# Patient Record
Sex: Female | Born: 1955 | Race: White | Hispanic: No | Marital: Married | Smoking: Never smoker
Health system: Southern US, Community
[De-identification: ages and names within clinical notes are randomized; demographics above are authoritative.]

## PROBLEM LIST (undated history)

## (undated) DIAGNOSIS — L309 Dermatitis, unspecified: Secondary | ICD-10-CM

## (undated) DIAGNOSIS — J449 Chronic obstructive pulmonary disease, unspecified: Secondary | ICD-10-CM

## (undated) DIAGNOSIS — J069 Acute upper respiratory infection, unspecified: Secondary | ICD-10-CM

## (undated) DIAGNOSIS — E119 Type 2 diabetes mellitus without complications: Secondary | ICD-10-CM

## (undated) DIAGNOSIS — M069 Rheumatoid arthritis, unspecified: Secondary | ICD-10-CM

## (undated) DIAGNOSIS — J45909 Unspecified asthma, uncomplicated: Secondary | ICD-10-CM

## (undated) DIAGNOSIS — J189 Pneumonia, unspecified organism: Secondary | ICD-10-CM

## (undated) DIAGNOSIS — R011 Cardiac murmur, unspecified: Secondary | ICD-10-CM

## (undated) DIAGNOSIS — K219 Gastro-esophageal reflux disease without esophagitis: Secondary | ICD-10-CM

## (undated) DIAGNOSIS — I10 Essential (primary) hypertension: Secondary | ICD-10-CM

## (undated) DIAGNOSIS — T783XXA Angioneurotic edema, initial encounter: Secondary | ICD-10-CM

## (undated) DIAGNOSIS — N289 Disorder of kidney and ureter, unspecified: Secondary | ICD-10-CM

## (undated) DIAGNOSIS — Z87442 Personal history of urinary calculi: Secondary | ICD-10-CM

## (undated) DIAGNOSIS — D801 Nonfamilial hypogammaglobulinemia: Secondary | ICD-10-CM

## (undated) DIAGNOSIS — M1990 Unspecified osteoarthritis, unspecified site: Secondary | ICD-10-CM

## (undated) DIAGNOSIS — L509 Urticaria, unspecified: Secondary | ICD-10-CM

## (undated) HISTORY — PX: SHOULDER SURGERY: SHX246

## (undated) HISTORY — PX: ADENOIDECTOMY: SUR15

## (undated) HISTORY — PX: KIDNEY STONE SURGERY: SHX686

## (undated) HISTORY — DX: Acute upper respiratory infection, unspecified: J06.9

## (undated) HISTORY — DX: Type 2 diabetes mellitus without complications: E11.9

## (undated) HISTORY — DX: Angioneurotic edema, initial encounter: T78.3XXA

## (undated) HISTORY — PX: TONSILLECTOMY: SUR1361

## (undated) HISTORY — DX: Dermatitis, unspecified: L30.9

## (undated) HISTORY — PX: TYMPANOSTOMY TUBE PLACEMENT: SHX32

## (undated) HISTORY — DX: Urticaria, unspecified: L50.9

## (undated) HISTORY — PX: SINOSCOPY: SHX187

## (undated) HISTORY — DX: Essential (primary) hypertension: I10

## (undated) HISTORY — PX: ENDOMETRIAL ABLATION: SHX621

## (undated) SURGERY — BRONCHOSCOPY, FLEXIBLE
Anesthesia: Monitor Anesthesia Care | Laterality: Bilateral

---

## 1989-05-13 HISTORY — PX: TUBAL LIGATION: SHX77

## 2002-12-09 ENCOUNTER — Encounter: Payer: Self-pay | Admitting: Internal Medicine

## 2002-12-09 ENCOUNTER — Inpatient Hospital Stay (HOSPITAL_COMMUNITY): Admission: EM | Admit: 2002-12-09 | Discharge: 2002-12-12 | Payer: Self-pay | Admitting: Internal Medicine

## 2002-12-10 ENCOUNTER — Encounter: Payer: Self-pay | Admitting: Internal Medicine

## 2003-06-09 ENCOUNTER — Ambulatory Visit (HOSPITAL_COMMUNITY): Admission: RE | Admit: 2003-06-09 | Discharge: 2003-06-09 | Payer: Self-pay | Admitting: Obstetrics & Gynecology

## 2005-05-14 ENCOUNTER — Ambulatory Visit (HOSPITAL_COMMUNITY): Admission: RE | Admit: 2005-05-14 | Discharge: 2005-05-14 | Payer: Self-pay | Admitting: Internal Medicine

## 2005-05-16 ENCOUNTER — Ambulatory Visit (HOSPITAL_COMMUNITY): Admission: RE | Admit: 2005-05-16 | Discharge: 2005-05-16 | Payer: Self-pay | Admitting: Internal Medicine

## 2005-05-17 ENCOUNTER — Encounter: Admission: RE | Admit: 2005-05-17 | Discharge: 2005-05-17 | Payer: Self-pay | Admitting: Internal Medicine

## 2006-08-28 ENCOUNTER — Ambulatory Visit (HOSPITAL_COMMUNITY): Admission: RE | Admit: 2006-08-28 | Discharge: 2006-08-28 | Payer: Self-pay | Admitting: Internal Medicine

## 2007-05-26 ENCOUNTER — Emergency Department (HOSPITAL_COMMUNITY): Admission: EM | Admit: 2007-05-26 | Discharge: 2007-05-27 | Payer: Self-pay | Admitting: Emergency Medicine

## 2007-05-29 ENCOUNTER — Ambulatory Visit (HOSPITAL_COMMUNITY): Admission: RE | Admit: 2007-05-29 | Discharge: 2007-05-29 | Payer: Self-pay | Admitting: Urology

## 2007-12-01 ENCOUNTER — Ambulatory Visit (HOSPITAL_COMMUNITY): Admission: RE | Admit: 2007-12-01 | Discharge: 2007-12-01 | Payer: Self-pay | Admitting: Internal Medicine

## 2008-11-04 ENCOUNTER — Encounter: Payer: Self-pay | Admitting: Emergency Medicine

## 2008-11-04 ENCOUNTER — Observation Stay (HOSPITAL_COMMUNITY): Admission: EM | Admit: 2008-11-04 | Discharge: 2008-11-05 | Payer: Self-pay | Admitting: Emergency Medicine

## 2010-06-03 ENCOUNTER — Encounter: Payer: Self-pay | Admitting: Internal Medicine

## 2010-08-02 ENCOUNTER — Other Ambulatory Visit (HOSPITAL_COMMUNITY): Payer: Self-pay | Admitting: Internal Medicine

## 2010-08-02 DIAGNOSIS — Z139 Encounter for screening, unspecified: Secondary | ICD-10-CM

## 2010-08-07 ENCOUNTER — Ambulatory Visit (HOSPITAL_COMMUNITY)
Admission: RE | Admit: 2010-08-07 | Discharge: 2010-08-07 | Disposition: A | Discharge status: Home / Self Care | Payer: No Typology Code available for payment source | Source: Ambulatory Visit | Attending: Internal Medicine | Admitting: Internal Medicine

## 2010-08-07 DIAGNOSIS — Z139 Encounter for screening, unspecified: Secondary | ICD-10-CM

## 2010-08-07 DIAGNOSIS — Z1231 Encounter for screening mammogram for malignant neoplasm of breast: Secondary | ICD-10-CM | POA: Insufficient documentation

## 2010-08-20 LAB — BASIC METABOLIC PANEL WITH GFR
BUN: 12 mg/dL (ref 6–23)
CO2: 24 meq/L (ref 19–32)
Calcium: 8.7 mg/dL (ref 8.4–10.5)
Chloride: 110 meq/L (ref 96–112)
Creatinine, Ser: 0.62 mg/dL (ref 0.4–1.2)
GFR calc non Af Amer: >60 mL/min
Glucose, Bld: 96 mg/dL (ref 70–99)
Potassium: 4 meq/L (ref 3.5–5.1)
Sodium: 141 meq/L (ref 135–145)

## 2010-08-20 LAB — URINE MICROSCOPIC-ADD ON

## 2010-08-20 LAB — CBC
HCT: 36.7 % (ref 36.0–46.0)
Hemoglobin: 12.5 g/dL (ref 12.0–15.0)
MCHC: 34.1 g/dL (ref 30.0–36.0)
MCV: 98 fL (ref 78.0–100.0)
Platelets: 178 K/uL (ref 150–400)
RBC: 3.74 MIL/uL — ABNORMAL LOW (ref 3.87–5.11)
RDW: 12.4 % (ref 11.5–15.5)
WBC: 6.6 K/uL (ref 4.0–10.5)

## 2010-08-20 LAB — GLUCOSE, CAPILLARY
Glucose-Capillary: 100 mg/dL — ABNORMAL HIGH (ref 70–99)
Glucose-Capillary: 105 mg/dL — ABNORMAL HIGH (ref 70–99)

## 2010-08-20 LAB — URINALYSIS, ROUTINE W REFLEX MICROSCOPIC
Glucose, UA: NEGATIVE mg/dL
Ketones, ur: NEGATIVE mg/dL
Urobilinogen, UA: 0.2 mg/dL (ref 0.0–1.0)
pH: 7 (ref 5.0–8.0)

## 2010-09-18 ENCOUNTER — Ambulatory Visit (INDEPENDENT_AMBULATORY_CARE_PROVIDER_SITE_OTHER): Payer: No Typology Code available for payment source | Admitting: Internal Medicine

## 2010-09-25 NOTE — H&P (Signed)
NAME:  Kathy Tran, Kathy Tran                  ACCOUNT NO.:  [id number]   MEDICAL RECORD NO.:  [id number]          PATIENT TYPE:  AMB   LOCATION:  DAY                           FACILITY:  [facility]   PHYSICIAN:  Ky Barban, M.D.DATE OF BIRTH:  May 13, 1956   DATE OF ADMISSION:  DATE OF DISCHARGE:  LH                              HISTORY & PHYSICAL   CHIEF COMPLAINT:  Left renal colic.   HISTORY:  A 55 year old female who is well known to me.  She has a  history of kidney stones.  A couple of days ago was seen in the office  with severe left renal colic.  That evening she ended up in the  emergency room.  A CT scan was done.  It shows a 2 x 4-mm stone in the  left ureterovesical junction.  She is still having pain.  So, I told her  there is a good chance she will pass the stone but she says she is  suffering with this pain for 2 weeks and wants to have something done  about it.  She is familiar with the stone basket, so I am going to go  ahead and schedule her for cysto, stone basket, holmium laser  lithotripsy, double-J stent.  She is familiar with the procedure, the  limitations, complications.  She told me she had it done 3 times before  in the past.  It was done in [place].   PAST MEDICAL HISTORY:  1. History of having 2 C-sections in 1982 and 1986.  2. Sinus surgery in 1998.  3. Tubal ligation in 1990.  4. She does have hypertension.   MEDICATIONS:  Hydrochlorothiazide.   PERSONAL HISTORY:  Does not smoke.  Drinks 2-3 drinks a year.   REVIEW OF SYSTEMS:  Unremarkable.   PHYSICAL EXAMINATION:  VITAL SIGNS:  Blood pressure 140/80, temperature  is normal.  CENTRAL NERVOUS SYSTEM:  Negative.  HEAD/NECK/ENT:  Negative.  CHEST:  Symmetrical.  Heart regular sinus rhythm.  No murmur.  ABDOMEN:  Flat.  Liver, spleen, and kidneys are not palpable.  One plus  left CVA tenderness.  PELVIC:  Deferred.  EXTREMITIES:  Normal.   IMPRESSION:  Left ureteral calculus.   PLAN:  Cystoscopy, left  retrograde pyelogram, ureteroscopic stone  basket, holmium laser lithotripsy use of double-J stent under anesthesia  as an outpatient.      Ky Barban, M.D.  Electronically Signed     MIJ/MEDQ  D:  05/28/2007  T:  05/28/2007  Job:  [id number]

## 2010-09-25 NOTE — Consult Note (Signed)
NAME:  Kathy Tran, Kathy Tran NO.:  [id number]   MEDICAL RECORD NO.:  [id number]          PATIENT TYPE:  OBV   LOCATION:  1304                         FACILITY:  [facility]   PHYSICIAN:  Valetta Fuller, M.D.  DATE OF BIRTH:  23-Sep-1955   DATE OF CONSULTATION:  DATE OF DISCHARGE:                                 CONSULTATION   CHIEF COMPLAINT:  Right flank pain flank pain with nausea and documented  6-mm right distal ureteral calculus.   HISTORY OF PRESENT ILLNESS:  Kathy Tran is a 55 year old female.  She  has had substantial issues with recurrent nephrolithiasis and estimates  over 100 clinical stone events.  She has required surgical  intervention/procedures on at least four occasions by an outside  Urologist in [place].  The patient developed gross hematuria several  weeks ago.  Today she developed severe right flank pain with severe  nausea.  She presented to [facility] Emergency Room.  CT scan there  showed a 3 x 6 mm stone in her distal right ureter.  The patient  received numerous doses of Dilaudid, Toradol and Zofran.  I was  contacted on several occasions by the nurse practitioner at [facility]  ER.  Apparently no local Urology coverage was obtainable.  Because of  ongoing pain.  They requested transfer to [facility] for consideration  of admission.  We agreed to that.  The patient's pain is substantially  better at this time but she has continued to have some discomfort along  with nausea.  I have reviewed her CT scan and records from [facility].   PAST HISTORY:  1. Hypertension.  2. Diabetes mellitus (type 2).   CURRENT MEDICATIONS:  Metformin, Carvedilol, lactulose.   SHE HAS ALLERGIES TO DEMEROL AND SULFA.   SOCIAL HISTORY:  Negative for tobacco or alcohol use.   FAMILY HISTORY:  Noncontributory.   REVIEW OF SYSTEMS:  Negative for fever and chills.  Positive for nausea,  abdominal pain, flank pain, previous gross hematuria, urinary  frequency,  urgency.  She is having no other respiratory, chest complaints, chest  pain and review of systems otherwise negative/noncontributory.   EXAM:  She is well-developed, well-nourished female.  She was nontoxic  appearing and in minimal distress.  She was afebrile with a blood  pressure of 137/63, pulse 74, respiratory rate 14.  NECK:  Without JVD or masses.  Respiratory effort normal.  HEART:  Regular rate and rhythm.  ABDOMEN:  Soft, minimal right CVA tenderness.  Abdomen without masses,  hepatosplenomegaly or considerable tenderness.  No evidence of hernia.  EXTREMITIES:  No edema.  SKIN:  Warm and dry.  NEUROLOGY:  Nonfocal.   DATA:  Basic laboratory studies pending.   ASSESSMENT:  A 6 x 3 mm right distal ureteral calculus.  Her pain is  reasonably well-controlled at this time but she has spent numerous hours  in the [facility] ER and has now been transferred to [facility].  I  suggested overnight observation with NPO after midnight.  If the stone  passes then we will  plan on discharge in the morning.  Otherwise she  will be reassessed in the morning and decision will be made at that time  whether she wants to have definitive management or whether she would go  home with attempts to try to pass the stone spontaneously.      Valetta Fuller, M.D.  Electronically Signed     DSG/MEDQ  D:  11/04/2008  T:  11/05/2008  Job:  [id number]

## 2010-09-25 NOTE — Op Note (Signed)
Kathy Tran, Kathy Tran NO.:  [id number]   MEDICAL RECORD NO.:  [id number]          PATIENT TYPE:  OBV   LOCATION:  1304                         FACILITY:  [facility]   PHYSICIAN:  Valetta Fuller, M.D.  DATE OF BIRTH:  December 02, 1955   DATE OF PROCEDURE:  11/05/2008  DATE OF DISCHARGE:  11/05/2008                               OPERATIVE REPORT   PREOPERATIVE DIAGNOSIS:  Right distal ureteral calculus.   POSTOPERATIVE DIAGNOSIS:  Right distal ureteral calculus.   PROCEDURES PERFORMED:  1. Cystoscopy.  2. Right retrograde pyelogram.  3. Right ureteroscopy.  4. Stone basketing.  5. Right double-J stent placement.   SURGEON:  Valetta Fuller, MD.   ANESTHESIA:  General.   INDICATIONS:  Ms. Tolsma is a 55 year old female with over 100 previous  clinical stone events per her history.  She normally receives care by a  urologist in [place] who is unavailable this weekend.  She developed  severe right flank pain and was seen in the [facility] Emergency Room.  They were unable to get her pain under control and, therefore, she was  transferred to [facility].  At that time her pain was better and we  admitted her for overnight observation.  She continued to have pain and  the stone did not pass.  We recommended ureteroscopy and she appeared to  understand risks and benefits of the procedure.  She presents now for  this.  CT demonstrated a 3-mm x 6-mm stone in her distal right ureter  causing moderate obstruction.   TECHNIQUE AND FINDINGS:  The patient had already received oral  ciprofloxacin.  She had successful induction of general LMA anesthesia.  She was placed in the lithotomy position and prepped draped in the usual  manner.  Appropriate surgical time-out was performed.  Cystoscopy was  unremarkable other than what appeared to be a fairly narrow ureteral  orifice on the right.  Retrograde pyelogram confirmed a 6-mm filling  defect 3-4 cm above her ureterovesical  junction.  A wire was placed  beyond that without difficulty utilizing fluoroscopic guidance.  The  retrograde pyelogram was done by myself with fluoroscopic  interpretation.   The orifice appeared to be too tight to engage safely with the  ureteroscope.  For that reason, we used the inside portion of an access  sheath to provide one-step dilation of the distal ureter.  The distal  ureter was then easily  engaged with the ureteroscope and a 3-mm x 6-mm stone was encountered.  This was basket extracted without difficulty.  Over the guidewire we  placed a 6-French 24 cm stent with visual as well as fluoroscopic  guidance.  The patient appeared to tolerate the procedure well, there  were no obvious complications.      Valetta Fuller, M.D.  Electronically Signed     DSG/MEDQ  D:  11/05/2008  T:  11/06/2008  Job:  [id number]

## 2010-09-25 NOTE — Op Note (Signed)
NAME:  DAIL, MEECE                  ACCOUNT NO.:  [id number]   MEDICAL RECORD NO.:  [id number]          PATIENT TYPE:  AMB   LOCATION:  DAY                           FACILITY:  [facility]   PHYSICIAN:  Ky Barban, M.D.DATE OF BIRTH:  04/29/1956   DATE OF PROCEDURE:  05/29/2007  DATE OF DISCHARGE:                               OPERATIVE REPORT   PREOPERATIVE DIAGNOSIS:  Left ureteral calculus.   POSTOPERATIVE DIAGNOSIS:  No stone.   PROCEDURE:  Cystoscopy, left retrograde pyelogram and ureteroscopy.   SURGEON:  Ky Barban, M.D.   ANESTHESIA:  General endotracheal.   PROCEDURE IN DETAIL:  With the patient under general endotracheal  anesthesia in lithotomy position a regional prep and drape #25  cystoscope was introduced into the bladder.  Left ureteral orifice  catheterized.  With a __________  catheter Hypaque was in injected.  The  left ureter is slightly dilated above the ureterovesical junction but I  do not see any filling defect.  Then I decided to look into the ureter.  A guidewire was passed up into the renal pelvis.  Alongside the  guidewire a short rigid ureteroscope was introduced.  I went up to the  level of the upper ureter.  I do not see any stone but there appears to  be site in the ureterovesical junction which feels like a stone was  there but not any more.  I removed the ureteroscope, looked at the  bladder again thoroughly with the scope.  I do not see any stone in the  bladder either.  Guidewire and then scope was removed.  The patient to  recovery room in satisfactory condition.      Ky Barban, M.D.  Electronically Signed     MIJ/MEDQ  D:  05/29/2007  T:  05/29/2007  Job:  [id number]

## 2010-09-28 NOTE — H&P (Signed)
NAME:  Tran, Kathy A.                           ACCOUNT NO.:  [id number]   MEDICAL RECORD NO.:  [id number]                  PATIENT TYPE:   LOCATION:                                       FACILITY:   PHYSICIAN:  Lazaro Arms, M.D.                DATE OF BIRTH:  Nov 19, 1955   DATE OF ADMISSION:  DATE OF DISCHARGE:                                HISTORY & PHYSICAL   HISTORY OF PRESENT ILLNESS:  Kathy Tran is a 55 year old white female, gravida  2, para 2 status post a tubal ligation in 1990 who has been bleeding  frequently every 24 days and then was bleeding up to 2-3 weeks at a time  prior to me seeing her in December.  I put her on Megace which has pretty  much stopped her bleeding. Her hemoglobin when she presented to the office  was 11. She was also quite tender with the bleeding and her periods have  been quite heavy at times with large clotting. As a result of her ongoing  frequent heavy bleeding and prolonged at time, she is admitted for  hysteroscopy, D&C, endometrial ablation.   PAST MEDICAL HISTORY:  Negative.   PAST SURGICAL HISTORY:  Tonsillectomy in 1965, two cesarean sections in 1982  and 1986 and tubal ligation.   PAST OB:  Two cesarean sections.   ALLERGIES:  SULFA DRUGS.   MEDICATIONS:  Her only medication is lactulose syrup for constipation.   REVIEW OF SYMPTOMS:  Otherwise negative.   PHYSICAL EXAMINATION:  VITAL SIGNS:  Weight 153 pounds, blood pressure  140/80.  HEENT:  Unremarkable. Thyroid normal.  LUNGS:  Clear.  HEART:  Regular rate and rhythm without murmur, regurg or gallop.  BREASTS:  Without mass, discharge or skin changes.  ABDOMEN:  Benign, no hepatosplenomegaly or masses.  PELVIC:  She has normal external genitalia, the vagina is pink, moist  without discharge.  Cervix is nulliparous without lesions.  Uterus is small,  normal size. The adnexa is negative.   IMPRESSION:  1. Menometrorrhagia.  2. Dysmenorrhea.   PLAN:  The patient is  admitted for hysteroscopy D&C, endometrial ablation.  She understands the risks, benefits, indications and alternatives.  She was  given a brochure regarding the procedure and will proceed.     ___________________________________________                                         Lazaro Arms, M.D.   [name]  D:  06/08/2003  T:  06/08/2003  Job:  [id number]

## 2010-09-28 NOTE — Op Note (Signed)
NAME:  Kathy Tran, Kathy Tran                            ACCOUNT NO.:  [id number]   MEDICAL RECORD NO.:  [id number]                   PATIENT TYPE:  AMB   LOCATION:  DAY                                  FACILITY:  [facility]   PHYSICIAN:  Lazaro Arms, M.D.                DATE OF BIRTH:  07-May-1956   DATE OF PROCEDURE:  06/09/2003  DATE OF DISCHARGE:                                 OPERATIVE REPORT   PREOPERATIVE DIAGNOSES:  1. Menometrorrhagia.  2. Dysmenorrhea.   POSTOPERATIVE DIAGNOSES:  1. Menometrorrhagia.  2. Dysmenorrhea.   PROCEDURE:  Hysteroscopy, D&C, and endometrial ablation.   SURGEON:  Lazaro Arms, M.D.   ANESTHESIA:  General endotracheal.   FINDINGS:  The patient had a normal endometrium.  She had been on Megace for  over a month.  She had no polyps or other abnormalities.   DESCRIPTION OF PROCEDURE:  The patient was taken to the operating room and  placed in the supine position where she underwent general endotracheal  anesthesia.  She was then placed in the dorsal lithotomy position and  prepped and draped in the usual sterile fashion.  The bladder was drained.   A Graves speculum was placed.  The cervix was grasped with a single-tooth  tenaculum.  Marcaine 0.5% with 1:200,000 epinephrine was given as a  paracervical block, 10 cc bilaterally.  The cervix was somewhat stenotic.  Pediatric dilators were used, and the cervix was dilated serially to allow  passage of the hysteroscope.  Hysteroscopy was performed, and the  endometrial cavity was completely normal and quite consistent with prolonged  Megace therapy.  The endometrial cavity was then scraped using a sharp  curette, and there was good uterine cry obtained in all areas.  Endometrial  ablation was then performed using a ThermaChoice III endometrial ablation  balloon.  11 cc were required to maintain a pressure of approximately 200  mmHg throughout the procedure.  It was heated to 87 degrees Celsius for a  total  therapy time of 9 minutes and 6 seconds.  All of the fluid was  recovered at the end.   The patient was awakened from anesthesia and taken to the recovery room in  good and stable condition.  All counts were correct.     ___________________________________________                                            Lazaro Arms, M.D.   LHE/MEDQ  D:  06/09/2003  T:  06/09/2003  Job:  [id number]

## 2010-09-28 NOTE — Discharge Summary (Signed)
   NAME:  CHERIA, SADIQ NO.:  [id number]   MEDICAL RECORD NO.:  [id number]                  PATIENT TYPE:   LOCATION:                                       FACILITY:   PHYSICIAN:  Kingsley Callander. Ouida Sills, M.D.                  DATE OF BIRTH:   DATE OF ADMISSION:  12/09/2002  DATE OF DISCHARGE:  12/12/2002                                 DISCHARGE SUMMARY   DISCHARGE DIAGNOSIS:  Right renal colic.   PROCEDURE:  1. CT of the abdomen.  2. Ultrasound of the pelvis.   HOSPITAL COURSE:  This patient is a 56 year old female who presented with  right flank pain.  A urinalysis revealed no red blood cells.  Her CT scan of  the abdomen revealed a nonobstructing 1 cm right renal calyceal calculus.  She was treated with IV morphine and IV fluids. She was treated empirically  with IV Levaquin after she initially had a low-grade fever.  Her white count  was 7000.  She was seen in consultation by urology.  Lithotripsy has been  tentatively planned. She was noted to have fluid in her pelvis on her CT.  An ultrasound of the pelvis was obtained and revealed no fluid.  She did  have a small fibroid.  She was seen in gynecology consultation by Dr. Despina Hidden  who did not feel as though her pain was related to any gynecologic  pathology.   Her pain moderately improved. She was felt to be stable for discharge on  August 1.  She will have follow up with urology next week.    DISCHARGE MEDICATIONS:  1. Percocet 1-2 q.4-6h. p.r.n.  2. HCTZ daily.  3. Lactulose daily.                                               Kingsley Callander. Ouida Sills, M.D.    ROF/MEDQ  D:  12/12/2002  T:  12/12/2002  Job:  [id number]   cc:   Ky Barban, M.D.  [address]  [place]  [place] [address]  Fax: [phone]   Lazaro Arms, M.D.  [address]  [place]  [place] [address]  Fax: [phone]

## 2010-09-28 NOTE — H&P (Signed)
NAME:  Kathy Tran, Kathy Tran                            ACCOUNT NO.:  [id number]   MEDICAL RECORD NO.:  [id number]                   PATIENT TYPE:  INP   LOCATION:  A326                                 FACILITY:  [facility]   PHYSICIAN:  Kingsley Callander. Ouida Sills, M.D.                  DATE OF BIRTH:  21-Mar-1956   DATE OF ADMISSION:  12/09/2002  DATE OF DISCHARGE:                                HISTORY & PHYSICAL   CHIEF COMPLAINT:  Right flank pain.   HISTORY OF PRESENT ILLNESS:  This patient is a 55 year old female who  presented to the emergency room with excruciating pain in her right flank  consistent with prior bouts of renal colic.  She was evaluated in the ER  though and found to have no blood in the urine and no stone in her ureter.  There was a 1 cm right renal calculus though which was nonobstructing.  A  mild to moderate amount of free pelvic fluid was present.  She had not  vomited; she had not experienced diaphoresis.  She required Toradol and  Dilaudid for pain control.  Her pain was rated at a 10/10.  Due to  inadequate pain relief she was felt to require hospitalization for  additional pain management and evaluation.  She had not experienced gross  hematuria.  There was no change in her bowel habits.  Her appetite had been  fine.  She had been completely asymptomatic until the sudden onset of this  on the morning of admission.   PAST MEDICAL HISTORY:  1. Tubal ligation.  2. Two C-sections.  3. Hernia repair.  4. Sinus surgery.  5. Tonsillectomy.   MEDICATIONS:  Lactulose and hydrochlorothiazide.   ALLERGIES:  SULFA.   SOCIAL HISTORY:  She smokes an occasional cigarette.  She rarely drinks  alcohol.  She does not use recreational drugs.  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   FAMILY HISTORY:  Her father died of prostate cancer and had kidney stones.  Her mother has emphysema.  She has a brother with hypertension.   REVIEW OF SYSTEMS:  Noncontributory.   PHYSICAL EXAMINATION:  VITAL SIGNS:  Temperature 100, pulse 95, respirations  20, blood pressure 136/85.  GENERAL:  Uncomfortable-appearing young white female.  HEENT:  No scleral icterus.  The pharynx is unremarkable.  NECK:  Supple with no JVD or thyromegaly.  LUNGS:  Clear.  HEART:  Regular with no murmurs.  ABDOMEN:  Mildly tender in the right flank and right mid abdomen.  No  palpable mass.  No hepatosplenomegaly.  EXTREMITIES:  Normal pulses.  No cyanosis, clubbing, or edema.  NEUROLOGIC:  Intact.   LABORATORY DATA:  White count 7000, hemoglobin 13.7, platelets 197.  Sodium  138, potassium 3.5, glucose 97, BUN 10, creatinine 0.7.  LFTs are normal.  The urinalysis is negative.  A CT scan of  the abdomen reveals mild to  moderate fluid in the pelvis, a 1 cm right renal calculus which is  nonobstructing.   IMPRESSION:  Right flank and abdominal pain.  She has no definitive evidence  of this being renal colic at this point.  She is being hospitalized for pain  control and further evaluation.  A urine culture will be obtained in light  of her low-grade fever.  She will treated empirically with IV Levaquin.  Her  urine will be strained.                                               Kingsley Callander. Ouida Sills, M.D.    ROF/MEDQ  D:  12/10/2002  T:  12/10/2002  Job:  [id number]

## 2010-09-28 NOTE — Consult Note (Signed)
   NAME:  Kathy Tran                            ACCOUNT NO.:  [id number]   MEDICAL RECORD NO.:  [id number]                   PATIENT TYPE:  INP   LOCATION:  A326                                 FACILITY:  [facility]   PHYSICIAN:  Lazaro Arms, M.D.                DATE OF BIRTH:  January 08, 1956   DATE OF CONSULTATION:  12/10/2002  DATE OF DISCHARGE:                                   CONSULTATION   HISTORY OF PRESENT ILLNESS:  Kathy Tran is a 55 year old white female  gravida 2, para 2, status post tubal ligation who was admitted with  excruciating pain in the right back, flank and radiating to the lower front  on the right.  She has had multiple episodes in the past of renal colic;  however, this episode did not respond to outpatient therapy as it normally  has in the past.  She had a CT scan through the ER which revealed fluid in  the pelvis and a 1-cm calculus in the right kidney; but, there was no  ureteral dilatation at the time.  In addition, her urinalysis was negative  for blood.  Subsequently Dr. 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  The patient states she had a regular menses and  that they are not unusually painful or unusually heavy.  She denies pain  with intercourse.   PHYSICAL EXAMINATION:  ABDOMEN:  Benign.  She has no rebound tenderness.  PELVIC:  Reveals an anteverted uterus.  If feels normal size, shape, contour  and the ovaries are normal, nontender.  She basically has a normal pelvic  exam.   Her white count is 7,000 with an unremarkable differential.   IMPRESSION:  1. Right flank and right lower quadrant pain, questionable etiology.  2. No evidence of a gynecologic source including ovarian cyst or infection.  3. No evidence, at the present, for this being an acute appendicitis.   PLAN:  I talked with Dr. Ouida Sills and informed him of my thoughts on Kathy Tran  and have no particular followup request.  I do agree the Levaquin should be  continued for the time being.  My impression is this represents an unusual  renal colic episode for her.  If she were to continue to have problems, I  will be glad to see her in the office in the future.                                               Lazaro Arms, M.D.    [name]  D:  12/10/2002  T:  12/10/2002  Job:  [id number]

## 2010-12-10 ENCOUNTER — Other Ambulatory Visit (HOSPITAL_COMMUNITY): Payer: Self-pay | Admitting: Internal Medicine

## 2010-12-10 DIAGNOSIS — M545 Low back pain, unspecified: Secondary | ICD-10-CM

## 2010-12-13 ENCOUNTER — Ambulatory Visit (HOSPITAL_COMMUNITY)
Admission: RE | Admit: 2010-12-13 | Discharge: 2010-12-13 | Disposition: A | Discharge status: Home / Self Care | Payer: 59 | Source: Ambulatory Visit | Attending: Internal Medicine | Admitting: Internal Medicine

## 2010-12-13 DIAGNOSIS — M545 Low back pain, unspecified: Secondary | ICD-10-CM

## 2010-12-13 DIAGNOSIS — M5126 Other intervertebral disc displacement, lumbar region: Secondary | ICD-10-CM | POA: Insufficient documentation

## 2010-12-13 DIAGNOSIS — M25559 Pain in unspecified hip: Secondary | ICD-10-CM | POA: Insufficient documentation

## 2010-12-13 DIAGNOSIS — R209 Unspecified disturbances of skin sensation: Secondary | ICD-10-CM | POA: Insufficient documentation

## 2010-12-13 DIAGNOSIS — M79609 Pain in unspecified limb: Secondary | ICD-10-CM | POA: Insufficient documentation

## 2011-01-30 LAB — BASIC METABOLIC PANEL
BUN: 19
Calcium: 9.8
Creatinine, Ser: 0.68
Glucose, Bld: 131 — ABNORMAL HIGH
Potassium: 3.7

## 2011-01-30 LAB — URINALYSIS, ROUTINE W REFLEX MICROSCOPIC
Bilirubin Urine: NEGATIVE
Glucose, UA: NEGATIVE
Nitrite: POSITIVE — AB
Urobilinogen, UA: 0.2
pH: 6

## 2011-01-30 LAB — DIFFERENTIAL
Basophils Absolute: 0.1
Eosinophils Relative: 3
Lymphocytes Relative: 28
Lymphs Abs: 2
Neutro Abs: 4.5
Neutrophils Relative %: 62

## 2011-01-30 LAB — CBC
Platelets: 259
RBC: 4.44

## 2011-01-30 LAB — URINE MICROSCOPIC-ADD ON

## 2011-01-31 LAB — BASIC METABOLIC PANEL
BUN: 11
CO2: 27
Chloride: 108
Potassium: 4.1

## 2011-01-31 LAB — URINALYSIS, ROUTINE W REFLEX MICROSCOPIC
Bilirubin Urine: NEGATIVE
Glucose, UA: NEGATIVE
Ketones, ur: NEGATIVE
Protein, ur: NEGATIVE
Urobilinogen, UA: 0.2

## 2011-01-31 LAB — CBC
HCT: 38.7
MCHC: 34.3
MCV: 95.3
Platelets: 221
RBC: 4.06
WBC: 5.3

## 2011-01-31 LAB — DIFFERENTIAL
Basophils Relative: 1
Eosinophils Absolute: 0.1
Eosinophils Relative: 2
Lymphs Abs: 1.6
Monocytes Relative: 5

## 2011-02-07 ENCOUNTER — Encounter (INDEPENDENT_AMBULATORY_CARE_PROVIDER_SITE_OTHER): Payer: Self-pay | Admitting: Internal Medicine

## 2011-02-07 ENCOUNTER — Telehealth (INDEPENDENT_AMBULATORY_CARE_PROVIDER_SITE_OTHER): Payer: Self-pay | Admitting: *Deleted

## 2011-02-07 ENCOUNTER — Ambulatory Visit (INDEPENDENT_AMBULATORY_CARE_PROVIDER_SITE_OTHER): Payer: 59 | Admitting: Internal Medicine

## 2011-02-07 DIAGNOSIS — Z1211 Encounter for screening for malignant neoplasm of colon: Secondary | ICD-10-CM

## 2011-02-07 MED ORDER — PEG-KCL-NACL-NASULF-NA ASC-C 100 G PO SOLR: 1.0000 | Freq: Once | ORAL | Status: DC

## 2011-02-07 NOTE — Progress Notes (Signed)
Subjective:     Patient ID: Kathy Tran, female   DOB: 04-20-1956, 55 y.o.   MRN: [id number]  HPI  Kathy Tran is a 55 yr old female referred to office by Dr. Ouida Sills for a screening colonoscopy.  Her last colonoscopy was in 1998. 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  Dr. Derrill Kay thought maybe a pediatric scope should have been used.  It was twisted and she was placed on Lactulose for prevent constipation. She has BM every 3 days. Her stools are normal size. If she goes without the Lactulose she will become constipated.  No melena or bright red rectal bleeding. Appetite is good. No unintentional weight loss. No abdominal pain. Occasional acid reflux. She avoids acidic foods.  No dysphagia.    Review of SystemsAlert and oriented. Skin warm and dry. Oral mucosa is moist. Natural teeth in good condition. Sclera anicteric, conjunctivae is pink. Thyroid not enlarged. No cervical lymphadenopathy. Lungs clear. Heart regular rate and rhythm.  Abdomen is soft. Bowel sounds are positive. No hepatomegaly. No abdominal masses felt. No tenderness.  No edema to lower extremities. Patient is alert and oriented.   Current Outpatient Prescriptions  Medication Sig Dispense Refill  . carvedilol (COREG) 25 MG tablet Take 25 mg by mouth 2 (two) times daily with a meal.        . diphenhydrAMINE (BENADRYL) 25 MG tablet Take 25 mg by mouth as needed.        . hydrochlorothiazide (HYDRODIURIL) 25 MG tablet Take 25 mg by mouth daily.        Marland Kitchen HYDROcodone-acetaminophen (VICODIN) 5-500 MG per tablet Take 0.5 tablets by mouth 3 (three) times daily.        Marland Kitchen lactulose (CHRONULAC) 10 GM/15ML solution Take 20 g by mouth 3 (three) times daily. One tablespoon twice a week for constipation.        . metFORMIN (GLUCOPHAGE) 1000 MG tablet Take 1,000 mg by mouth 2 (two) times daily with a meal.        No past surgical history on file.  History   Social History Narrative  . No narrative on file    History   Social History  . Marital Status: Married    Spouse Name: N/A    Number of Children: N/A  . Years of Education: N/A   Occupational History  . Not on file.   Social History Main Topics  . Smoking status: Never Smoker   . Smokeless tobacco: Not on file  . Alcohol Use: No  . Drug Use: No  . Sexually Active: Not on file   Other Topics Concern  . Not on file   Social History Narrative  . No narrative on file   History reviewed. No pertinent family history. History reviewed. No pertinent family history. Family Status  Relation Status Death Age  . Mother Deceased     COPD  . Father Deceased     Prostate CA  . Brother Alive     good health  . Child Alive     good health     Objective:   Physical Exam Filed Vitals:   02/07/11 1424  Height: 5\' 4"  (1.626 m)  Weight: 154 lb 4.8 oz (69.99 kg)   Filed Vitals:   02/07/11 1424  BP: 132/72  Pulse: 76  Temp: 98.1 F (36.7 C)  Height: 5\' 4"  (1.626 m)  Weight: 154 lb 4.8 oz (69.99 kg)  Alert and oriented. Skin warm and dry. Oral mucosa is moist. Natural teeth in good condition. Sclera anicteric, conjunctivae is pink. Thyroid not enlarged. No cervical lymphadenopathy. Lungs clear. Heart regular rate and rhythm.  Abdomen is soft. Bowel sounds are positive. No hepatomegaly. No abdominal masses felt. No tenderness.  No edema to lower extremities. Patient is alert and oriented.     Assessment:    Screening colonoscopy    Plan:    Will schedule a colonoscopy in the near future.   The risks and benefits such as perforation, bleeding, and infection were reviewed with the patient and is agreeable. Patient is satisfied with the care she received today.

## 2011-02-07 NOTE — Telephone Encounter (Signed)
TCS sch'd 03/27/11 @ 2:00 (1:00), split movi prep instructions given

## 2011-03-18 ENCOUNTER — Encounter (HOSPITAL_COMMUNITY): Payer: Self-pay | Admitting: Pharmacy Technician

## 2011-03-18 ENCOUNTER — Other Ambulatory Visit (INDEPENDENT_AMBULATORY_CARE_PROVIDER_SITE_OTHER): Payer: Self-pay | Admitting: *Deleted

## 2011-03-18 DIAGNOSIS — Z1211 Encounter for screening for malignant neoplasm of colon: Secondary | ICD-10-CM

## 2011-03-26 MED ORDER — SODIUM CHLORIDE 0.45 % IV SOLN
Freq: Once | INTRAVENOUS | Status: AC
  Administered 2011-03-27: 13:00:00 via INTRAVENOUS

## 2011-03-27 ENCOUNTER — Encounter (HOSPITAL_COMMUNITY)
Admission: RE | Disposition: A | Discharge status: Home / Self Care | Payer: Self-pay | Source: Ambulatory Visit | Attending: Internal Medicine

## 2011-03-27 ENCOUNTER — Encounter (HOSPITAL_COMMUNITY): Payer: Self-pay | Admitting: *Deleted

## 2011-03-27 ENCOUNTER — Other Ambulatory Visit (INDEPENDENT_AMBULATORY_CARE_PROVIDER_SITE_OTHER): Payer: Self-pay | Admitting: Internal Medicine

## 2011-03-27 ENCOUNTER — Ambulatory Visit (HOSPITAL_COMMUNITY)
Admission: RE | Admit: 2011-03-27 | Discharge: 2011-03-27 | Disposition: A | Discharge status: Home / Self Care | Payer: 59 | Source: Ambulatory Visit | Attending: Internal Medicine | Admitting: Internal Medicine

## 2011-03-27 DIAGNOSIS — E119 Type 2 diabetes mellitus without complications: Secondary | ICD-10-CM | POA: Insufficient documentation

## 2011-03-27 DIAGNOSIS — Z1211 Encounter for screening for malignant neoplasm of colon: Secondary | ICD-10-CM

## 2011-03-27 DIAGNOSIS — K644 Residual hemorrhoidal skin tags: Secondary | ICD-10-CM | POA: Insufficient documentation

## 2011-03-27 DIAGNOSIS — Z01812 Encounter for preprocedural laboratory examination: Secondary | ICD-10-CM | POA: Insufficient documentation

## 2011-03-27 DIAGNOSIS — D126 Benign neoplasm of colon, unspecified: Secondary | ICD-10-CM | POA: Insufficient documentation

## 2011-03-27 DIAGNOSIS — I10 Essential (primary) hypertension: Secondary | ICD-10-CM | POA: Insufficient documentation

## 2011-03-27 DIAGNOSIS — Z79899 Other long term (current) drug therapy: Secondary | ICD-10-CM | POA: Insufficient documentation

## 2011-03-27 HISTORY — DX: Cardiac murmur, unspecified: R01.1

## 2011-03-27 HISTORY — PX: COLONOSCOPY: SHX5424

## 2011-03-27 SURGERY — COLONOSCOPY
Anesthesia: Moderate Sedation

## 2011-03-27 MED ORDER — MEPERIDINE HCL 50 MG/ML IJ SOLN
INTRAMUSCULAR | Status: DC | PRN
  Administered 2011-03-27 (×2): 25 mg via INTRAVENOUS

## 2011-03-27 MED ORDER — MEPERIDINE HCL 50 MG/ML IJ SOLN
INTRAMUSCULAR | Status: AC
  Filled 2011-03-27: qty 1

## 2011-03-27 MED ORDER — MIDAZOLAM HCL 5 MG/5ML IJ SOLN
INTRAMUSCULAR | Status: AC
  Filled 2011-03-27: qty 10

## 2011-03-27 MED ORDER — MIDAZOLAM HCL 5 MG/5ML IJ SOLN
INTRAMUSCULAR | Status: DC | PRN
  Administered 2011-03-27 (×5): 2 mg via INTRAVENOUS

## 2011-03-27 MED ORDER — STERILE WATER FOR IRRIGATION IR SOLN
Status: DC | PRN
  Administered 2011-03-27: 15:00:00

## 2011-03-27 MED ORDER — SODIUM CHLORIDE 0.9 % IJ SOLN
INTRAMUSCULAR | Status: DC | PRN
  Administered 2011-03-27: 10 mL

## 2011-03-27 NOTE — Op Note (Signed)
COLONOSCOPY PROCEDURE REPORT  PATIENT:  Kathy Tran  MR#:  [id number] Birthdate:  [birth year], 55 y.o., female Endoscopist:  Dr. Malissa Hippo, MD Referred By:  Dr. Carylon Perches, MD. Procedure Date: 03/27/2011  Procedure:   Colonoscopy with snare polypectomy  Indications: Patient is 55 year old Caucasian female who is undergoing average risk screening colonoscopy. She is a chronic constipation doing well on  lactulose and prune juice.   Informed Consent: Procedure and risks were reviewed with the patient and informed consent was obtained.   Medications:  Demerol 50 mg IV Versed 10 mg IV  Description of procedure:  After a digital rectal exam was performed, that colonoscope was advanced from the anus through the rectum and colon to the area of the cecum, ileocecal valve and appendiceal orifice. The cecum was deeply intubated. These structures were well-seen and photographed for the record. From the level of the cecum and ileocecal valve, the scope was slowly and cautiously withdrawn. The mucosal surfaces were carefully surveyed utilizing scope tip to flexion to facilitate fold flattening as needed. The scope was pulled down into the rectum where a thorough exam including retroflexion was performed.  Findings:   Prep excellent. 8-9 mm sessile polyp snared from hepatic flexure following saline injection. Polypectomy complete. 6 mm polyp snared from mid sigmoid colon. Mall hemorrhoids below the dentate line.  Therapeutic/Diagnostic Maneuvers Performed:  See above  Complications:  None  Cecal Withdrawal Time:  14 minutes  Impression:  Examination performed to cecum. 8-9 mm sessile polyp snared from hepatic flexure following saline injection. 6 mm polyp snared from sigmoid colon. Small external hemorrhoids.  Recommendations:  Standard instructions given. I will be contacting patient with results of biopsy and further recommendations.  Curlie Sittner U  03/27/2011 3:06 PM  CC: Dr.  Carylon Perches, MD & Dr. Bonnetta Barry ref. provider found

## 2011-03-27 NOTE — H&P (Signed)
Kathy Tran is an 55 y.o. female.   Chief Complaint: Patient is here for colonoscopy. HPI: Patient is a 55 year old Caucasian female who is here for average risk screening colonoscopy. She had one' 20 years ago because of constipation. She has to take lactulose and prune juice and her bowels move regularly. She denies abdominal pain rectal bleeding or melena. Family history is negative for colorectal carcinoma to  Past Medical History  Diagnosis Date  . Hypertension     x 5 yrs  . Diabetes mellitus type 2, controlled     x5 yrs  . Heart murmur     Past Surgical History  Procedure Date  . Tonsillectomy   . Cesarean section 1982, 1986  . Tubal ligation 1991  . Kidney stone surgery     multiple  . Endometrial ablation     Family History  Problem Relation Age of Onset  . COPD Mother   . Cancer Father    Social History:  reports that she has never smoked. She does not have any smokeless tobacco history on file. She reports that she drinks alcohol. She reports that she does not use illicit drugs.  Allergies:  Allergies  Allergen Reactions  . Sulfa Antibiotics Anaphylaxis    Medications Prior to Admission  Medication Dose Route Frequency Provider Last Rate Last Dose  . 0.45 % sodium chloride infusion   Intravenous Once Malissa Hippo, MD 20 mL/hr at 03/27/11 1318    . meperidine (DEMEROL) 50 MG/ML injection           . midazolam (VERSED) 5 MG/5ML injection            No current outpatient prescriptions on file as of 03/27/2011.    Results for orders placed during the hospital encounter of 03/27/11 (from the past 48 hour(s))  GLUCOSE, CAPILLARY     Status: Abnormal   Collection Time   03/27/11  1:05 PM      Component Value Range Comment   Glucose-Capillary 121 (*) 70 - 99 (mg/dL)    No results found.  Review of Systems  Constitutional: Negative for weight loss.  Gastrointestinal: Positive for constipation. Negative for abdominal pain, diarrhea, blood in stool and  melena.    Blood pressure 136/83, pulse 83, temperature 97.8 F (36.6 C), temperature source Oral, resp. rate 18, height 5\' 4"  (1.626 m), weight 148 lb (67.132 kg), SpO2 98.00%. Physical Exam  Constitutional: She appears well-developed and well-nourished.  HENT:  Mouth/Throat: Oropharynx is clear and moist.  Eyes: Conjunctivae are normal. No scleral icterus.  Neck: No thyromegaly present.  Cardiovascular: Normal rate, regular rhythm and normal heart sounds.   No murmur heard. Respiratory: Effort normal and breath sounds normal.  GI: Soft. She exhibits no distension and no mass. There is no tenderness.  Musculoskeletal: She exhibits no edema.  Lymphadenopathy:    She has no cervical adenopathy.  Neurological: She is alert.  Skin: Skin is warm.     Assessment/Plan Average risk screening colonoscopy.  Jonell Brumbaugh U 03/27/2011, 2:29 PM

## 2011-04-03 ENCOUNTER — Encounter (HOSPITAL_COMMUNITY): Payer: Self-pay | Admitting: Internal Medicine

## 2011-04-22 ENCOUNTER — Encounter (INDEPENDENT_AMBULATORY_CARE_PROVIDER_SITE_OTHER): Payer: Self-pay | Admitting: *Deleted

## 2011-06-13 ENCOUNTER — Encounter (HOSPITAL_COMMUNITY): Payer: Self-pay | Admitting: Emergency Medicine

## 2011-06-13 ENCOUNTER — Emergency Department (HOSPITAL_COMMUNITY): Payer: 59

## 2011-06-13 ENCOUNTER — Emergency Department (HOSPITAL_COMMUNITY)
Admission: EM | Admit: 2011-06-13 | Discharge: 2011-06-13 | Disposition: A | Discharge status: Home / Self Care | Payer: 59 | Attending: Emergency Medicine | Admitting: Emergency Medicine

## 2011-06-13 DIAGNOSIS — I10 Essential (primary) hypertension: Secondary | ICD-10-CM | POA: Insufficient documentation

## 2011-06-13 DIAGNOSIS — Z9889 Other specified postprocedural states: Secondary | ICD-10-CM | POA: Insufficient documentation

## 2011-06-13 DIAGNOSIS — Z87442 Personal history of urinary calculi: Secondary | ICD-10-CM | POA: Insufficient documentation

## 2011-06-13 DIAGNOSIS — R109 Unspecified abdominal pain: Secondary | ICD-10-CM

## 2011-06-13 DIAGNOSIS — E119 Type 2 diabetes mellitus without complications: Secondary | ICD-10-CM | POA: Insufficient documentation

## 2011-06-13 DIAGNOSIS — R110 Nausea: Secondary | ICD-10-CM | POA: Insufficient documentation

## 2011-06-13 HISTORY — DX: Disorder of kidney and ureter, unspecified: N28.9

## 2011-06-13 LAB — URINALYSIS, ROUTINE W REFLEX MICROSCOPIC
Bilirubin Urine: NEGATIVE
Glucose, UA: NEGATIVE mg/dL
Ketones, ur: NEGATIVE mg/dL
Urobilinogen, UA: 0.2 mg/dL (ref 0.0–1.0)
pH: 6 (ref 5.0–8.0)

## 2011-06-13 LAB — BASIC METABOLIC PANEL
BUN: 17 mg/dL (ref 6–23)
Chloride: 101 mEq/L (ref 96–112)
Creatinine, Ser: 0.51 mg/dL (ref 0.50–1.10)
GFR calc non Af Amer: >90 mL/min (ref >90)
Glucose, Bld: 157 mg/dL — ABNORMAL HIGH (ref 70–99)
Potassium: 3.6 mEq/L (ref 3.5–5.1)

## 2011-06-13 MED ORDER — ONDANSETRON HCL 4 MG/2ML IJ SOLN
4.0000 mg | Freq: Once | INTRAMUSCULAR | Status: AC
  Administered 2011-06-13: 4 mg via INTRAVENOUS

## 2011-06-13 MED ORDER — ONDANSETRON HCL 4 MG/2ML IJ SOLN
INTRAMUSCULAR | Status: AC
  Filled 2011-06-13: qty 2

## 2011-06-13 MED ORDER — PROMETHAZINE HCL 25 MG/ML IJ SOLN
12.5000 mg | Freq: Once | INTRAMUSCULAR | Status: AC
  Administered 2011-06-13: 12.5 mg via INTRAVENOUS

## 2011-06-13 MED ORDER — KETOROLAC TROMETHAMINE 30 MG/ML IJ SOLN
30.0000 mg | Freq: Once | INTRAMUSCULAR | Status: AC
  Administered 2011-06-13: 30 mg via INTRAVENOUS
  Filled 2011-06-13: qty 1

## 2011-06-13 MED ORDER — OXYCODONE-ACETAMINOPHEN 5-325 MG PO TABS: 1.0000 | ORAL_TABLET | ORAL | Status: AC | PRN

## 2011-06-13 MED ORDER — HYDROMORPHONE HCL PF 1 MG/ML IJ SOLN
1.0000 mg | Freq: Once | INTRAMUSCULAR | Status: AC
  Administered 2011-06-13: 1 mg via INTRAVENOUS

## 2011-06-13 MED ORDER — PROMETHAZINE HCL 25 MG/ML IJ SOLN
INTRAMUSCULAR | Status: AC
  Administered 2011-06-13: 12.5 mg via INTRAVENOUS
  Filled 2011-06-13: qty 1

## 2011-06-13 MED ORDER — PROMETHAZINE HCL 25 MG PO TABS: 25.0000 mg | ORAL_TABLET | Freq: Four times a day (QID) | ORAL | Status: AC | PRN

## 2011-06-13 MED ORDER — HYDROMORPHONE HCL PF 1 MG/ML IJ SOLN
INTRAMUSCULAR | Status: AC
  Filled 2011-06-13: qty 1

## 2011-06-13 NOTE — ED Notes (Signed)
Pt in ct 

## 2011-06-13 NOTE — ED Notes (Signed)
Pt with right flank pain and extensive hx of kidney stones.  Pt with nausea

## 2011-06-13 NOTE — ED Notes (Signed)
Pt given ginger ale.

## 2011-06-13 NOTE — ED Provider Notes (Signed)
History     CSN: [id number]  Arrival date & time 06/13/11  2039   First MD Initiated Contact with Patient 06/13/11 2043      Chief Complaint  Patient presents with  . Flank Pain    (Consider location/radiation/quality/duration/timing/severity/associated sxs/prior treatment) Patient is a 56 y.o. female presenting with flank pain.  Flank Pain This is a recurrent problem. Episode onset: 2 days ago. The problem occurs intermittently (she states her pain was more left sided 2 days,  ago,  but has moved to the right flank today.). The problem has been unchanged. Associated symptoms include chills and nausea. Pertinent negatives include no abdominal pain, arthralgias, chest pain, congestion, fever, headaches, joint swelling, neck pain, numbness, rash, sore throat, urinary symptoms, vomiting or weakness. Associated symptoms comments: She denies dysuria or increased urinary frequency,  But states her urine was very dark this morning.. The symptoms are aggravated by nothing (Not worsened with movement,  eating, positional changes.). She has tried nothing for the symptoms.    Past Medical History  Diagnosis Date  . Hypertension     x 5 yrs  . Diabetes mellitus type 2, controlled     x5 yrs  . Heart murmur   . Renal disorder     Past Surgical History  Procedure Date  . Tonsillectomy   . Cesarean section 1982, 1986  . Tubal ligation 1991  . Kidney stone surgery     multiple  . Endometrial ablation   . Colonoscopy 03/27/2011    Procedure: COLONOSCOPY;  Surgeon: Malissa Hippo, MD;  Location: [facility];  Service: Endoscopy;  Laterality: N/A;  2:00    Family History  Problem Relation Age of Onset  . COPD Mother   . Cancer Father     History  Substance Use Topics  . Smoking status: Never Smoker   . Smokeless tobacco: Not on file  . Alcohol Use: Yes    OB History    Grav Para Term Preterm Abortions TAB SAB Ect Mult Living                  Review of Systems    Constitutional: Positive for chills. Negative for fever.  HENT: Negative for congestion, sore throat and neck pain.   Eyes: Negative.   Respiratory: Negative for chest tightness and shortness of breath.   Cardiovascular: Negative for chest pain.  Gastrointestinal: Positive for nausea. Negative for vomiting and abdominal pain.  Genitourinary: Positive for flank pain. Negative for dysuria.  Musculoskeletal: Negative for joint swelling and arthralgias.  Skin: Negative.  Negative for rash and wound.  Neurological: Negative for dizziness, weakness, light-headedness, numbness and headaches.  Hematological: Negative.   Psychiatric/Behavioral: Negative.     Allergies  Sulfa antibiotics  Home Medications   Current Outpatient Rx  Name Route Sig Dispense Refill  . AZELAIC ACID 15 % EX GEL Topical Apply 1 application topically 2 (two) times daily. After skin is thoroughly washed and patted dry, gently but thoroughly massage a thin film of azelaic acid cream into the affected area twice daily, in the morning and evening.     Marland Kitchen CARVEDILOL 12.5 MG PO TABS Oral Take 12.5 mg by mouth 2 (two) times daily with a meal.      . DIPHENHYDRAMINE HCL 25 MG PO TABS Oral Take 25 mg by mouth at bedtime.     Marland Kitchen HYDROCHLOROTHIAZIDE 25 MG PO TABS Oral Take 25 mg by mouth every other day.     Marland Kitchen LACTULOSE  10 GM/15ML PO SOLN Oral Take 30 g by mouth daily as needed. Two tablespoon twice a week for constipation.     Marland Kitchen METFORMIN HCL ER (OSM) 500 MG PO TB24 Oral Take 2,000 mg by mouth daily.      . OXYCODONE-ACETAMINOPHEN 5-325 MG PO TABS Oral Take 1 tablet by mouth every 4 (four) hours as needed for pain. 30 tablet 0    BP 147/81  Pulse 115  Temp(Src) 98.4 F (36.9 C) (Oral)  Resp 22  Ht 5\' 4"  (1.626 m)  Wt 153 lb (69.4 kg)  BMI 26.26 kg/m2  SpO2 100%  Physical Exam  Nursing note and vitals reviewed. Constitutional: She is oriented to person, place, and time. She appears well-developed and well-nourished.   HENT:  Head: Normocephalic and atraumatic.  Eyes: Conjunctivae are normal.  Neck: Normal range of motion.  Cardiovascular: Normal rate, regular rhythm, normal heart sounds and intact distal pulses.   Pulmonary/Chest: Effort normal and breath sounds normal. She has no wheezes.  Abdominal: Soft. Bowel sounds are normal. There is no tenderness. There is CVA tenderness. There is no rebound.       CVA tenderness right.  Musculoskeletal: Normal range of motion.  Neurological: She is alert and oriented to person, place, and time.  Skin: Skin is warm and dry.  Psychiatric: She has a normal mood and affect.    ED Course  Procedures (including critical care time)  Labs Reviewed  BASIC METABOLIC PANEL - Abnormal; Notable for the following:    Glucose, Bld 157 (*)    All other components within normal limits  URINALYSIS, ROUTINE W REFLEX MICROSCOPIC  URINALYSIS, WITH MICROSCOPIC   Ct Abdomen Pelvis Wo Contrast  06/13/2011  *RADIOLOGY REPORT*  Clinical Data: Right flank pain.  History of renal stones.  CT ABDOMEN AND PELVIS WITHOUT CONTRAST  Technique:  Multidetector CT imaging of the abdomen and pelvis was performed following the standard protocol without intravenous contrast.  Comparison: 11/04/2008  Findings: Mild dependent atelectasis in the lung bases.  The kidneys appear symmetrical in size and shape.  No pyelocaliectasis or ureterectasis.  No renal, ureteral, or bladder stones.  The bladder wall is not thickened.  The unenhanced appearance of the liver, spleen, gallbladder, pancreas, adrenal glands, abdominal aorta, and retroperitoneal lymph nodes is unremarkable.  The stomach, small bowel, and colon are not distended.  Diffusely stool filled colon.  No free air or free fluid in the abdomen.  Incidental note of vascular calcifications in the splenic hilum possibly representing a small aneurysm measuring about 6 mm.  Pelvis:  The uterus and adnexal structures are not enlarged. Surgical clips  consistent with tubal ligations.  Scattered diverticula in the sigmoid colon without inflammatory change.  The appendix is normal.  No free or loculated pelvic fluid collections. Mild degenerative changes in the lumbar spine with normal alignment.  IMPRESSION: No renal or ureteral stone or obstruction.  No acute inflammatory process suggested in the abdomen or pelvis.  Original Report Authenticated By: Marlon Pel, M.D.     1. Flank pain       MDM  Suspect renal colic vs tiny stone missed by CT scan.  Pt has f/u with Dr Isabel Caprice,  Her urologist.  Percocet,  Fluids.  Instructed to return for fever, vomiting,  Worse pain.        Candis Musa, PA 06/13/11 2312

## 2011-06-13 NOTE — ED Provider Notes (Signed)
Medical screening examination/treatment/procedure(s) were performed by non-physician practitioner and as supervising physician I was immediately available for consultation/collaboration.   Glynn Octave, MD 06/13/11 2352

## 2011-06-13 NOTE — ED Notes (Signed)
Paged lab

## 2011-09-25 ENCOUNTER — Other Ambulatory Visit (HOSPITAL_COMMUNITY): Payer: Self-pay | Admitting: Internal Medicine

## 2011-09-25 DIAGNOSIS — IMO0001 Reserved for inherently not codable concepts without codable children: Secondary | ICD-10-CM

## 2011-09-27 ENCOUNTER — Ambulatory Visit (HOSPITAL_COMMUNITY): Payer: 59

## 2011-09-27 ENCOUNTER — Ambulatory Visit (HOSPITAL_COMMUNITY)
Admission: RE | Admit: 2011-09-27 | Discharge: 2011-09-27 | Disposition: A | Discharge status: Home / Self Care | Payer: 59 | Source: Ambulatory Visit | Attending: Internal Medicine | Admitting: Internal Medicine

## 2011-09-27 DIAGNOSIS — IMO0001 Reserved for inherently not codable concepts without codable children: Secondary | ICD-10-CM

## 2011-09-27 DIAGNOSIS — Z1231 Encounter for screening mammogram for malignant neoplasm of breast: Secondary | ICD-10-CM | POA: Insufficient documentation

## 2012-05-20 ENCOUNTER — Other Ambulatory Visit (HOSPITAL_COMMUNITY): Payer: Self-pay | Admitting: Orthopaedic Surgery

## 2012-05-20 DIAGNOSIS — M25511 Pain in right shoulder: Secondary | ICD-10-CM

## 2012-05-26 ENCOUNTER — Ambulatory Visit (HOSPITAL_COMMUNITY)
Admission: RE | Admit: 2012-05-26 | Discharge: 2012-05-26 | Disposition: A | Discharge status: Home / Self Care | Payer: 59 | Source: Ambulatory Visit | Attending: Orthopaedic Surgery | Admitting: Orthopaedic Surgery

## 2012-05-26 DIAGNOSIS — M25519 Pain in unspecified shoulder: Secondary | ICD-10-CM | POA: Insufficient documentation

## 2012-05-26 DIAGNOSIS — M25511 Pain in right shoulder: Secondary | ICD-10-CM

## 2012-05-26 DIAGNOSIS — M719 Bursopathy, unspecified: Secondary | ICD-10-CM | POA: Insufficient documentation

## 2012-05-26 DIAGNOSIS — M898X9 Other specified disorders of bone, unspecified site: Secondary | ICD-10-CM | POA: Insufficient documentation

## 2012-05-26 DIAGNOSIS — M67919 Unspecified disorder of synovium and tendon, unspecified shoulder: Secondary | ICD-10-CM | POA: Insufficient documentation

## 2012-09-02 ENCOUNTER — Encounter (HOSPITAL_COMMUNITY): Payer: Self-pay

## 2012-09-02 ENCOUNTER — Emergency Department (HOSPITAL_COMMUNITY): Payer: 59

## 2012-09-02 ENCOUNTER — Emergency Department (HOSPITAL_COMMUNITY)
Admission: EM | Admit: 2012-09-02 | Discharge: 2012-09-03 | Disposition: A | Discharge status: Home / Self Care | Payer: 59 | Attending: Emergency Medicine | Admitting: Emergency Medicine

## 2012-09-02 DIAGNOSIS — Z79899 Other long term (current) drug therapy: Secondary | ICD-10-CM | POA: Insufficient documentation

## 2012-09-02 DIAGNOSIS — R011 Cardiac murmur, unspecified: Secondary | ICD-10-CM | POA: Insufficient documentation

## 2012-09-02 DIAGNOSIS — M898X1 Other specified disorders of bone, shoulder: Secondary | ICD-10-CM

## 2012-09-02 DIAGNOSIS — Z87448 Personal history of other diseases of urinary system: Secondary | ICD-10-CM | POA: Insufficient documentation

## 2012-09-02 DIAGNOSIS — R209 Unspecified disturbances of skin sensation: Secondary | ICD-10-CM | POA: Insufficient documentation

## 2012-09-02 DIAGNOSIS — I10 Essential (primary) hypertension: Secondary | ICD-10-CM | POA: Insufficient documentation

## 2012-09-02 DIAGNOSIS — E119 Type 2 diabetes mellitus without complications: Secondary | ICD-10-CM | POA: Insufficient documentation

## 2012-09-02 DIAGNOSIS — M25519 Pain in unspecified shoulder: Secondary | ICD-10-CM | POA: Insufficient documentation

## 2012-09-02 DIAGNOSIS — M549 Dorsalgia, unspecified: Secondary | ICD-10-CM | POA: Insufficient documentation

## 2012-09-02 DIAGNOSIS — Z7982 Long term (current) use of aspirin: Secondary | ICD-10-CM | POA: Insufficient documentation

## 2012-09-02 LAB — BASIC METABOLIC PANEL
BUN: 17 mg/dL (ref 6–23)
Calcium: 10.4 mg/dL (ref 8.4–10.5)
Creatinine, Ser: 0.61 mg/dL (ref 0.50–1.10)
GFR calc Af Amer: >90 mL/min (ref >90)
GFR calc non Af Amer: >90 mL/min (ref >90)

## 2012-09-02 LAB — CBC WITH DIFFERENTIAL/PLATELET
Basophils Relative: 1 % (ref 0–1)
Eosinophils Absolute: 0.2 10*3/uL (ref 0.0–0.7)
HCT: 40.4 % (ref 36.0–46.0)
Hemoglobin: 13.9 g/dL (ref 12.0–15.0)
MCH: 32.2 pg (ref 26.0–34.0)
MCHC: 34.4 g/dL (ref 30.0–36.0)
Monocytes Absolute: 0.3 10*3/uL (ref 0.1–1.0)
Monocytes Relative: 6 % (ref 3–12)

## 2012-09-02 LAB — D-DIMER, QUANTITATIVE: D-Dimer, Quant: 0.34 ug{FEU}/mL (ref 0.00–0.48)

## 2012-09-02 LAB — TROPONIN I: Troponin I: <0.3 ng/mL

## 2012-09-02 MED ORDER — ASPIRIN 81 MG PO CHEW
162.0000 mg | CHEWABLE_TABLET | Freq: Once | ORAL | Status: AC
  Administered 2012-09-02: 162 mg via ORAL
  Filled 2012-09-02: qty 2

## 2012-09-02 MED ORDER — MORPHINE SULFATE 4 MG/ML IJ SOLN
2.0000 mg | Freq: Once | INTRAMUSCULAR | Status: AC
  Administered 2012-09-03: 2 mg via INTRAVENOUS
  Filled 2012-09-02: qty 1

## 2012-09-02 MED ORDER — ONDANSETRON HCL 4 MG/2ML IJ SOLN
4.0000 mg | Freq: Once | INTRAMUSCULAR | Status: AC
  Administered 2012-09-02: 4 mg via INTRAVENOUS
  Filled 2012-09-02: qty 2

## 2012-09-02 NOTE — ED Provider Notes (Addendum)
History  This chart was scribed for [employer]. Colon Branch, MD by Shari Heritage and  Lacey Jensen, ED Scribes. The patient was seen in room [facility]14[facility]14. Patient's care was started at 2258.   CSN: [id number]  Arrival date & time 09/02/12  2206   First MD Initiated Contact with Patient 09/02/12 2258      Chief Complaint  Patient presents with  . Shortness of Breath  . Tingling  . Back Pain     The history is provided by the patient. No language interpreter was used.    HPI Comments:  Kathy Tran is a 57 y.o. female who presents to the Emergency Department complaining of intermittent, sudden left posterior shoulder pain with associated left upper extremity tingling onset 2 hours ago. Patient says that she is not having significant pain right now. She has a history of right rotator cuff tear and repair surgery. She states that she is currently in physical therapy. She has not used sling for her shoulder in the past month. She denies having done any extraordinary exertional activities prior to pain onset. Patient denies headache, lightheadedness, abdominal pain, nausea, vomiting, constipation or diarrhea. Her last bowel movement was this morning. Patient states that she has been taking lactulose for several years. Her medical history includes hypertension, diabetes, heart murmur and renal disorder.  PCP Dr. Ouida Sills Orthopedist Dr. Cleophas Dunker  Past Medical History  Diagnosis Date  . Hypertension     x 5 yrs  . Diabetes mellitus type 2, controlled     x5 yrs  . Heart murmur   . Renal disorder     Past Surgical History  Procedure Laterality Date  . Tonsillectomy    . Cesarean section  1982, 1986  . Tubal ligation  1991  . Kidney stone surgery      multiple  . Endometrial ablation    . Colonoscopy  03/27/2011    Procedure: COLONOSCOPY;  Surgeon: Malissa Hippo, MD;  Location: [facility];  Service: Endoscopy;  Laterality: N/A;  2:00  . Shoulder surgery      Family History  Problem  Relation Age of Onset  . COPD Mother   . Cancer Father     History  Substance Use Topics  . Smoking status: Never Smoker   . Smokeless tobacco: Not on file  . Alcohol Use: Yes    OB History   Grav Para Term Preterm Abortions TAB SAB Ect Mult Living                  Review of Systems  Constitutional: Negative for fever.       10 Systems reviewed and are negative for acute change except as noted in the HPI.  HENT: Negative for congestion.   Eyes: Negative for discharge and redness.  Respiratory: Negative for cough and shortness of breath.   Cardiovascular: Negative for chest pain.  Gastrointestinal: Negative for vomiting and abdominal pain.  Musculoskeletal: Negative for back pain.       Right shoulder pain. Left scapula pain.  Skin: Negative for rash.  Neurological: Negative for syncope, numbness and headaches.  Psychiatric/Behavioral:       No behavior change.   A complete 10 system review of systems was obtained and all systems are negative except as noted in the HPI and PMH.   Allergies  Sulfa antibiotics  Home Medications   Current Outpatient Rx  Name  Route  Sig  Dispense  Refill  . aspirin EC 81 MG  tablet   Oral   Take 81 mg by mouth daily.         . Azelaic Acid (FINACEA) 15 % cream   Topical   Apply 1 application topically 2 (two) times daily. After skin is thoroughly washed and patted dry, gently but thoroughly massage a thin film of azelaic acid cream into the affected area twice daily, in the morning and evening.          . carvedilol (COREG) 12.5 MG tablet   Oral   Take 12.5 mg by mouth 2 (two) times daily with a meal.           . diphenhydrAMINE (BENADRYL) 25 MG tablet   Oral   Take 25 mg by mouth at bedtime.          Marland Kitchen lactulose (CHRONULAC) 10 GM/15ML solution   Oral   Take 30 g by mouth daily as needed (constipation).          Marland Kitchen metformin (FORTAMET) 500 MG (OSM) 24 hr tablet   Oral   Take 2,000 mg by mouth daily.              Triage Vitals: BP 139/77  Pulse 84  Temp(Src) 97.4 F (36.3 C) (Oral)  Resp 20  Ht 5\' 3"  (1.6 m)  Wt 150 lb (68.04 kg)  BMI 26.58 kg/m2  SpO2 97%  Physical Exam  Constitutional: She is oriented to person, place, and time. She appears well-developed and well-nourished.  HENT:  Head: Normocephalic and atraumatic.  Eyes: Conjunctivae and EOM are normal. Pupils are equal, round, and reactive to light.  Neck: Normal range of motion. Neck supple.  Cardiovascular: Normal rate, regular rhythm and normal heart sounds.   Pulmonary/Chest: Effort normal. No respiratory distress.  Crackles at base of left lung.  Abdominal: Soft. Bowel sounds are normal.  Musculoskeletal:  Surgical scar at top of right shoulder, no redness or infection. Tenderness to medial aspect of scapula.   Neurological: She is alert and oriented to person, place, and time.  Skin: Skin is warm and dry.    ED Course  Procedures (including critical care time) Results for orders placed during the hospital encounter of 09/02/12  CBC WITH DIFFERENTIAL      Result Value Range   WBC 4.7  4.0 - 10.5 K/uL   RBC 4.32  3.87 - 5.11 MIL/uL   Hemoglobin 13.9  12.0 - 15.0 g/dL   HCT 16.1  09.6 - 04.5 %   MCV 93.5  78.0 - 100.0 fL   MCH 32.2  26.0 - 34.0 pg   MCHC 34.4  30.0 - 36.0 g/dL   RDW 40.9  81.1 - 91.4 %   Platelets 215  150 - 400 K/uL   Neutrophils Relative 52  43 - 77 %   Neutro Abs 2.5  1.7 - 7.7 K/uL   Lymphocytes Relative 39  12 - 46 %   Lymphs Abs 1.8  0.7 - 4.0 K/uL   Monocytes Relative 6  3 - 12 %   Monocytes Absolute 0.3  0.1 - 1.0 K/uL   Eosinophils Relative 3  0 - 5 %   Eosinophils Absolute 0.2  0.0 - 0.7 K/uL   Basophils Relative 1  0 - 1 %   Basophils Absolute 0.0  0.0 - 0.1 K/uL  BASIC METABOLIC PANEL      Result Value Range   Sodium 139  135 - 145 mEq/L   Potassium 4.0  3.5 - 5.1  mEq/L   Chloride 102  96 - 112 mEq/L   CO2 25  19 - 32 mEq/L   Glucose, Bld 166 (*) 70 - 99 mg/dL   BUN 17  6 -  23 mg/dL   Creatinine, Ser 1.61  0.50 - 1.10 mg/dL   Calcium 09.6  8.4 - 04.5 mg/dL   GFR calc non Af Amer >90  >90 mL/min   GFR calc Af Amer >90  >90 mL/min  TROPONIN I      Result Value Range   Troponin I <0.30  <0.30 ng/mL  D-DIMER, QUANTITATIVE      Result Value Range   D-Dimer, Quant 0.34  0.00 - 0.48 ug/mL-FEU  POCT I-STAT TROPONIN I      Result Value Range   Troponin i, poc 0.01  0.00 - 0.08 ng/mL   Comment 3             Date: 09/03/2012   2225  Rate:82  Rhythm: normal sinus rhythm  QRS Axis: normal  Intervals: normal  ST/T Wave abnormalities: normal  Conduction Disutrbances: none  Narrative Interpretation: unremarkable   DIAGNOSTIC STUDIES: Oxygen Saturation is 100% on room air, normal by my interpretation.    COORDINATION OF CARE: 11:27 PM- Patient informed of current plan for treatment and evaluation and agrees with plan at this time.   Dg Chest Portable 1 View  09/02/2012  *RADIOLOGY REPORT*  Clinical Data: Shortness of breath  PORTABLE CHEST - 1 VIEW  Comparison: 05/16/2005 chest CT  Findings: Hypoaeration with interstitial and vascular crowding. Mild bibasilar opacities, favor atelectasis.  Heart size and mediastinal contours within normal range.  No pleural effusion or pneumothorax. No acute osseous finding.  IMPRESSION: Hypoaeration with interstitial / vascular crowding and mild bibasilar atelectasis.   Original Report Authenticated By: Jearld Lesch, M.D.    Medications  aspirin chewable tablet 162 mg (162 mg Oral Given 09/02/12 2254)  morphine 4 MG/ML injection 2 mg (2 mg Intravenous Given 09/03/12 0010)  ondansetron (ZOFRAN) injection 4 mg (4 mg Intravenous Given 09/02/12 2350)      MDM  Patient with left sided scapular pain after having a right rotator cuff repair and in physical therapy. Labs are unremarkable. D-dimer, troponin are negative. Chest xray unremarkable. Reviewed results with patient and her husband. Pt stable in ED with no significant  deterioration in condition.The patient appears reasonably screened and/or stabilized for discharge and I doubt any other medical condition or other [facility] requiring further screening, evaluation, or treatment in the ED at this time prior to discharge.   I personally performed the services described in this documentation, which was scribed in my presence. The recorded information has been reviewed and considered.  MDM Reviewed: nursing note and vitals Interpretation: labs, ECG and x-ray           Nicoletta Dress. Colon Branch, MD 09/03/12 4098  Nicoletta Dress. Colon Branch, MD 09/03/12 [phone]

## 2012-09-02 NOTE — ED Notes (Signed)
Was getting an ice pack out of freezer to apply to her right shoulder (recent surgery) when pain began.  States pain was sudden and severe and has continued.  Denies any recent injury to neck, or any unusual activity with left arm.  Appears anxious.

## 2012-09-02 NOTE — ED Notes (Signed)
Pain in my left shoulder blade, tingling in my left arm, shortness of breath per pt. Started around 9:30 tonight per pt.

## 2012-09-02 NOTE — ED Notes (Signed)
States she took one baby asa at 19:00 per her norm, when pain began took a second baby asa at 21:30.

## 2012-09-03 NOTE — ED Notes (Signed)
C/o abdomen feeling like it is "bloating"  Requested head of bed lowered.

## 2012-11-18 ENCOUNTER — Other Ambulatory Visit (HOSPITAL_COMMUNITY): Payer: Self-pay | Admitting: Internal Medicine

## 2012-11-18 DIAGNOSIS — Z139 Encounter for screening, unspecified: Secondary | ICD-10-CM

## 2012-11-24 ENCOUNTER — Ambulatory Visit (HOSPITAL_COMMUNITY)
Admission: RE | Admit: 2012-11-24 | Discharge: 2012-11-24 | Disposition: A | Discharge status: Home / Self Care | Payer: 59 | Source: Ambulatory Visit | Attending: Internal Medicine | Admitting: Internal Medicine

## 2012-11-24 DIAGNOSIS — Z1231 Encounter for screening mammogram for malignant neoplasm of breast: Secondary | ICD-10-CM | POA: Insufficient documentation

## 2012-11-24 DIAGNOSIS — Z139 Encounter for screening, unspecified: Secondary | ICD-10-CM

## 2013-05-18 ENCOUNTER — Other Ambulatory Visit (HOSPITAL_COMMUNITY): Payer: Self-pay | Admitting: Internal Medicine

## 2013-05-18 ENCOUNTER — Ambulatory Visit (HOSPITAL_COMMUNITY)
Admission: RE | Admit: 2013-05-18 | Discharge: 2013-05-18 | Disposition: A | Discharge status: Home / Self Care | Payer: 59 | Source: Ambulatory Visit | Attending: Internal Medicine | Admitting: Internal Medicine

## 2013-05-18 DIAGNOSIS — X58XXXA Exposure to other specified factors, initial encounter: Secondary | ICD-10-CM | POA: Insufficient documentation

## 2013-05-18 DIAGNOSIS — S8990XA Unspecified injury of unspecified lower leg, initial encounter: Secondary | ICD-10-CM | POA: Insufficient documentation

## 2013-05-18 DIAGNOSIS — S99919A Unspecified injury of unspecified ankle, initial encounter: Principal | ICD-10-CM

## 2013-05-18 DIAGNOSIS — M79672 Pain in left foot: Secondary | ICD-10-CM

## 2013-05-18 DIAGNOSIS — M79609 Pain in unspecified limb: Secondary | ICD-10-CM | POA: Insufficient documentation

## 2013-05-18 DIAGNOSIS — S99929A Unspecified injury of unspecified foot, initial encounter: Principal | ICD-10-CM

## 2013-07-12 ENCOUNTER — Emergency Department (HOSPITAL_COMMUNITY): Payer: 59

## 2013-07-12 ENCOUNTER — Encounter (HOSPITAL_COMMUNITY): Payer: Self-pay | Admitting: Emergency Medicine

## 2013-07-12 ENCOUNTER — Emergency Department (HOSPITAL_COMMUNITY)
Admission: EM | Admit: 2013-07-12 | Discharge: 2013-07-12 | Disposition: A | Discharge status: Home / Self Care | Payer: 59 | Attending: Emergency Medicine | Admitting: Emergency Medicine

## 2013-07-12 DIAGNOSIS — Z79899 Other long term (current) drug therapy: Secondary | ICD-10-CM | POA: Insufficient documentation

## 2013-07-12 DIAGNOSIS — R197 Diarrhea, unspecified: Secondary | ICD-10-CM | POA: Insufficient documentation

## 2013-07-12 DIAGNOSIS — Z7982 Long term (current) use of aspirin: Secondary | ICD-10-CM | POA: Insufficient documentation

## 2013-07-12 DIAGNOSIS — Z87442 Personal history of urinary calculi: Secondary | ICD-10-CM | POA: Insufficient documentation

## 2013-07-12 DIAGNOSIS — I10 Essential (primary) hypertension: Secondary | ICD-10-CM | POA: Insufficient documentation

## 2013-07-12 DIAGNOSIS — E119 Type 2 diabetes mellitus without complications: Secondary | ICD-10-CM | POA: Insufficient documentation

## 2013-07-12 DIAGNOSIS — N3289 Other specified disorders of bladder: Secondary | ICD-10-CM | POA: Insufficient documentation

## 2013-07-12 DIAGNOSIS — R011 Cardiac murmur, unspecified: Secondary | ICD-10-CM | POA: Insufficient documentation

## 2013-07-12 DIAGNOSIS — Z87440 Personal history of urinary (tract) infections: Secondary | ICD-10-CM | POA: Insufficient documentation

## 2013-07-12 DIAGNOSIS — R109 Unspecified abdominal pain: Secondary | ICD-10-CM | POA: Insufficient documentation

## 2013-07-12 DIAGNOSIS — Z9851 Tubal ligation status: Secondary | ICD-10-CM | POA: Insufficient documentation

## 2013-07-12 DIAGNOSIS — R112 Nausea with vomiting, unspecified: Secondary | ICD-10-CM | POA: Insufficient documentation

## 2013-07-12 DIAGNOSIS — Z9889 Other specified postprocedural states: Secondary | ICD-10-CM | POA: Insufficient documentation

## 2013-07-12 LAB — CBC WITH DIFFERENTIAL/PLATELET
Basophils Absolute: 0 10*3/uL (ref 0.0–0.1)
Basophils Relative: 0 % (ref 0–1)
EOS ABS: 0.1 10*3/uL (ref 0.0–0.7)
EOS PCT: 2 % (ref 0–5)
HEMATOCRIT: 43.8 % (ref 36.0–46.0)
HEMOGLOBIN: 14.9 g/dL (ref 12.0–15.0)
LYMPHS ABS: 0.7 10*3/uL (ref 0.7–4.0)
LYMPHS PCT: 11 % — AB (ref 12–46)
MCH: 32.4 pg (ref 26.0–34.0)
MCHC: 34 g/dL (ref 30.0–36.0)
MCV: 95.2 fL (ref 78.0–100.0)
MONOS PCT: 3 % (ref 3–12)
Monocytes Absolute: 0.2 10*3/uL (ref 0.1–1.0)
Neutro Abs: 5.3 10*3/uL (ref 1.7–7.7)
Neutrophils Relative %: 84 % — ABNORMAL HIGH (ref 43–77)
Platelets: 202 10*3/uL (ref 150–400)
RBC: 4.6 MIL/uL (ref 3.87–5.11)
RDW: 12.4 % (ref 11.5–15.5)
WBC: 6.3 10*3/uL (ref 4.0–10.5)

## 2013-07-12 LAB — COMPREHENSIVE METABOLIC PANEL
ALK PHOS: 49 U/L (ref 39–117)
ALT: 13 U/L (ref 0–35)
AST: 13 U/L (ref 0–37)
Albumin: 4.1 g/dL (ref 3.5–5.2)
BUN: 21 mg/dL (ref 6–23)
CALCIUM: 9.5 mg/dL (ref 8.4–10.5)
CO2: 28 meq/L (ref 19–32)
Chloride: 101 mEq/L (ref 96–112)
Creatinine, Ser: 0.55 mg/dL (ref 0.50–1.10)
GLUCOSE: 124 mg/dL — AB (ref 70–99)
Potassium: 3.6 mEq/L — ABNORMAL LOW (ref 3.7–5.3)
Sodium: 140 mEq/L (ref 137–147)
TOTAL PROTEIN: 7.5 g/dL (ref 6.0–8.3)
Total Bilirubin: 0.7 mg/dL (ref 0.3–1.2)

## 2013-07-12 LAB — URINALYSIS, ROUTINE W REFLEX MICROSCOPIC
BILIRUBIN URINE: NEGATIVE
Glucose, UA: NEGATIVE mg/dL
HGB URINE DIPSTICK: NEGATIVE
Ketones, ur: NEGATIVE mg/dL
Leukocytes, UA: NEGATIVE
NITRITE: NEGATIVE
PROTEIN: NEGATIVE mg/dL
UROBILINOGEN UA: 0.2 mg/dL (ref 0.0–1.0)
pH: 6 (ref 5.0–8.0)

## 2013-07-12 LAB — LIPASE, BLOOD: LIPASE: 18 U/L (ref 11–59)

## 2013-07-12 MED ORDER — ONDANSETRON HCL 4 MG PO TABS: 4.0000 mg | ORAL_TABLET | Freq: Four times a day (QID) | ORAL | Status: DC

## 2013-07-12 MED ORDER — HYDROCODONE-ACETAMINOPHEN 5-325 MG PO TABS: 1.0000 | ORAL_TABLET | ORAL | Status: DC | PRN

## 2013-07-12 MED ORDER — ONDANSETRON HCL 4 MG/2ML IJ SOLN
4.0000 mg | Freq: Once | INTRAMUSCULAR | Status: AC
Start: 2013-07-12 — End: 2013-07-12
  Administered 2013-07-12: 4 mg via INTRAVENOUS
  Filled 2013-07-12: qty 2

## 2013-07-12 MED ORDER — KETOROLAC TROMETHAMINE 30 MG/ML IJ SOLN
30.0000 mg | Freq: Once | INTRAMUSCULAR | Status: AC
  Administered 2013-07-12: 30 mg via INTRAVENOUS
  Filled 2013-07-12: qty 1

## 2013-07-12 MED ORDER — SODIUM CHLORIDE 0.9 % IV BOLUS (SEPSIS)
1000.0000 mL | Freq: Once | INTRAVENOUS | Status: AC
  Administered 2013-07-12: 1000 mL via INTRAVENOUS

## 2013-07-12 MED ORDER — OXYBUTYNIN CHLORIDE 5 MG PO TABS: 5.0000 mg | ORAL_TABLET | Freq: Three times a day (TID) | ORAL | Status: DC | PRN

## 2013-07-12 MED ORDER — MORPHINE SULFATE 4 MG/ML IJ SOLN
4.0000 mg | Freq: Once | INTRAMUSCULAR | Status: AC
  Administered 2013-07-12: 4 mg via INTRAVENOUS
  Filled 2013-07-12: qty 1

## 2013-07-12 NOTE — ED Notes (Signed)
abd and back pain for 1 week, sent from Dr Ria Comment office.  Nausea.

## 2013-07-12 NOTE — ED Notes (Signed)
Pt c/o right flank pain that radiates to RLQ and pelvic area. Pt also reports polyuria. Symptoms began one week ago. Pt has hx of kidney stones.

## 2013-07-12 NOTE — ED Provider Notes (Signed)
TIME SEEN: 1:35 PM  CHIEF COMPLAINT: Bilateral flank pain and abdominal pain  HPI: Patient is a 58 y.o. F with history of kidney stones, hypertension, diabetes, prior lithotripsy and basket retrieval and stent to present to emergency room with one week of crampy, sharp bilateral flank pain that radiates into her lower abdomen with nausea and vomiting and diarrhea that started today.  No fevers or chills.  She has had urinary frequency and urgency but no dysuria or hematuria.  She was treated with 3 days of ciprofloxacin last week with some improvement of her symptoms.  She reports associated large prior history of kidney stones and frequent UTIs.  She denies any vaginal bleeding or discharge.  She is status post BTL and endometrial ablation.  She was seen by her PCP, Dr. Willey Blade, and sent to the emergency room for evaluation.  ROS: See HPI Constitutional: no fever  Eyes: no drainage  ENT: no runny nose   Cardiovascular:  no chest pain  Resp: no SOB  GI: no vomiting GU: no dysuria Integumentary: no rash  Allergy: no hives  Musculoskeletal: no leg swelling  Neurological: no slurred speech ROS otherwise negative  PAST MEDICAL HISTORY/PAST SURGICAL HISTORY:  Past Medical History  Diagnosis Date  . Hypertension     x 5 yrs  . Diabetes mellitus type 2, controlled     x5 yrs  . Heart murmur   . Renal disorder     MEDICATIONS:  Prior to Admission medications   Medication Sig Start Date End Date Taking? Authorizing Provider  aspirin EC 81 MG tablet Take 81 mg by mouth daily.    Historical Provider, MD  Azelaic Acid (FINACEA) 15 % cream Apply 1 application topically 2 (two) times daily. After skin is thoroughly washed and patted dry, gently but thoroughly massage a thin film of azelaic acid cream into the affected area twice daily, in the morning and evening.     Historical Provider, MD  carvedilol (COREG) 12.5 MG tablet Take 12.5 mg by mouth 2 (two) times daily with a meal.      Historical  Provider, MD  diphenhydrAMINE (BENADRYL) 25 MG tablet Take 25 mg by mouth at bedtime.     Historical Provider, MD  lactulose (CHRONULAC) 10 GM/15ML solution Take 30 g by mouth daily as needed (constipation).     Historical Provider, MD  metformin (FORTAMET) 500 MG (OSM) 24 hr tablet Take 2,000 mg by mouth daily.      Historical Provider, MD    ALLERGIES:  Allergies  Allergen Reactions  . Sulfa Antibiotics Anaphylaxis    SOCIAL HISTORY:  History  Substance Use Topics  . Smoking status: Never Smoker   . Smokeless tobacco: Not on file  . Alcohol Use: Yes    FAMILY HISTORY: Family History  Problem Relation Age of Onset  . COPD Mother   . Cancer Father     EXAM: BP 135/95  Pulse 109  Temp(Src) 99.1 F (37.3 C) (Oral)  Resp 18  Ht 5\' 3"  (1.6 m)  Wt 145 lb (65.772 kg)  BMI 25.69 kg/m2  SpO2 100% CONSTITUTIONAL: Alert and oriented and responds appropriately to questions. Well-appearing; well-nourished HEAD: Normocephalic EYES: Conjunctivae clear, PERRL ENT: normal nose; no rhinorrhea; moist mucous membranes; pharynx without lesions noted NECK: Supple, no meningismus, no LAD  CARD: RRR; S1 and S2 appreciated; no murmurs, no clicks, no rubs, no gallops RESP: Normal chest excursion without splinting or tachypnea; breath sounds clear and equal bilaterally; no wheezes, no  rhonchi, no rales,  ABD/GI: Normal bowel sounds; non-distended; soft, non-tender, no rebound, no guarding BACK:  The back appears normal and is non-tender to palpation, there is no CVA tenderness EXT: Normal ROM in all joints; non-tender to palpation; no edema; normal capillary refill; no cyanosis    SKIN: Normal color for age and race; warm NEURO: Moves all extremities equally PSYCH: The patient's mood and manner are appropriate. Grooming and personal hygiene are appropriate.  MEDICAL DECISION MAKING: Patient here with abdominal pain and flank pain.  I am unable to reproduce tenderness with palpation of her  back her abdomen.  She is nontender at McBurney's point and has a negative Murphy sign.  She is slightly tachycardic but afebrile and normotensive.  Concern for her UTI, pyelonephritis, kidney stone.  Less likely colitis, appendicitis cholecystitis.  We'll give IV fluids, pain and nausea medicine.  We'll obtain labs and CT scan.  ED PROGRESS: Patient reports feeling much better after IV Toradol, morphine and Zofran.  Her labs show no leukocytosis but there are predominant neutrophils.  Cholesterol is within normal limits.  LFTs, lipase normal.  Urine showed no sign of infection and no blood.  Her CT scan shows no acute abnormality.  The appendix is visualized and there is no inflammation or lymphadenopathy.  Unclear cause for patient's symptoms.  Discussed with patient that she may have had a urinary tract infection that has been partially treated.  Urine culture is pending.  Will discharge with prescription for pain medication, nausea medicine and Ditropan for bladder spasms.  Given strict return precautions.  We'll have her followup with her primary care physician if symptoms continue this week.  Patient has not been to verbalize understanding and are comfortable with plan.     [place], DO 07/12/13 1644

## 2013-07-12 NOTE — Discharge Instructions (Signed)
Abdominal Pain, Women °Abdominal (stomach, pelvic, or belly) pain can be caused by many things. It is important to tell your doctor: °· The location of the pain. °· Does it come and go or is it present all the time? °· Are there things that start the pain (eating certain foods, exercise)? °· Are there other symptoms associated with the pain (fever, nausea, vomiting, diarrhea)? °All of this is helpful to know when trying to find the cause of the pain. °CAUSES  °· Stomach: virus or bacteria infection, or ulcer. °· Intestine: appendicitis (inflamed appendix), regional ileitis (Crohn's disease), ulcerative colitis (inflamed colon), irritable bowel syndrome, diverticulitis (inflamed diverticulum of the colon), or cancer of the stomach or intestine. °· Gallbladder disease or stones in the gallbladder. °· Kidney disease, kidney stones, or infection. °· Pancreas infection or cancer. °· Fibromyalgia (pain disorder). °· Diseases of the female organs: °· Uterus: fibroid (non-cancerous) tumors or infection. °· Fallopian tubes: infection or tubal pregnancy. °· Ovary: cysts or tumors. °· Pelvic adhesions (scar tissue). °· Endometriosis (uterus lining tissue growing in the pelvis and on the pelvic organs). °· Pelvic congestion syndrome (female organs filling up with blood just before the menstrual period). °· Pain with the menstrual period. °· Pain with ovulation (producing an egg). °· Pain with an IUD (intrauterine device, birth control) in the uterus. °· Cancer of the female organs. °· Functional pain (pain not caused by a disease, may improve without treatment). °· Psychological pain. °· Depression. °DIAGNOSIS  °Your doctor will decide the seriousness of your pain by doing an examination. °· Blood tests. °· X-rays. °· Ultrasound. °· CT scan (computed tomography, special type of X-ray). °· MRI (magnetic resonance imaging). °· Cultures, for infection. °· Barium enema (dye inserted in the large intestine, to better view it with  X-rays). °· Colonoscopy (looking in intestine with a lighted tube). °· Laparoscopy (minor surgery, looking in abdomen with a lighted tube). °· Major abdominal exploratory surgery (looking in abdomen with a large incision). °TREATMENT  °The treatment will depend on the cause of the pain.  °· Many cases can be observed and treated at home. °· Over-the-counter medicines recommended by your caregiver. °· Prescription medicine. °· Antibiotics, for infection. °· Birth control pills, for painful periods or for ovulation pain. °· Hormone treatment, for endometriosis. °· Nerve blocking injections. °· Physical therapy. °· Antidepressants. °· Counseling with a psychologist or psychiatrist. °· Minor or major surgery. °HOME CARE INSTRUCTIONS  °· Do not take laxatives, unless directed by your caregiver. °· Take over-the-counter pain medicine only if ordered by your caregiver. Do not take aspirin because it can cause an upset stomach or bleeding. °· Try a clear liquid diet (broth or water) as ordered by your caregiver. Slowly move to a bland diet, as tolerated, if the pain is related to the stomach or intestine. °· Have a thermometer and take your temperature several times a day, and record it. °· Bed rest and sleep, if it helps the pain. °· Avoid sexual intercourse, if it causes pain. °· Avoid stressful situations. °· Keep your follow-up appointments and tests, as your caregiver orders. °· If the pain does not go away with medicine or surgery, you may try: °· Acupuncture. °· Relaxation exercises (yoga, meditation). °· Group therapy. °· Counseling. °SEEK MEDICAL CARE IF:  °· You notice certain foods cause stomach pain. °· Your home care treatment is not helping your pain. °· You need stronger pain medicine. °· You want your IUD removed. °· You feel faint or   lightheaded.  You develop nausea and vomiting.  You develop a rash.  You are having side effects or an allergy to your medicine. SEEK IMMEDIATE MEDICAL CARE IF:   Your  pain does not go away or gets worse.  You have a fever.  Your pain is felt only in portions of the abdomen. The right side could possibly be appendicitis. The left lower portion of the abdomen could be colitis or diverticulitis.  You are passing blood in your stools (bright red or black tarry stools, with or without vomiting).  You have blood in your urine.  You develop chills, with or without a fever.  You pass out. MAKE SURE YOU:   Understand these instructions.  Will watch your condition.  Will get help right away if you are not doing well or get worse. Document Released: 02/24/2007 Document Revised: 07/22/2011 Document Reviewed: 03/16/2009 [facility] Patient Information 2014 [place], [place].   Your labs today were normal.  You had normal kidney function, liver function tests and a lipase (test for your pancreas).  Your urine showed no blood or sign of infection.  Her CT scan showed no kidney stone or inflammation of your kidneys.  Her appendix was also visualized and appeared normal.  There is no other signs of bowel inflammation or other abnormality to explain your pain.  We recommend she follow up your primary care physician this week if your symptoms continue.  If you develop worsening pain, knee pain, fevers, vomiting and cannot keep any fluids down, have tarry and black or bloody stools, please return to the emergency department.

## 2013-07-13 LAB — URINE CULTURE
COLONY COUNT: NO GROWTH
Culture: NO GROWTH

## 2013-11-24 ENCOUNTER — Other Ambulatory Visit (HOSPITAL_COMMUNITY): Payer: Self-pay | Admitting: Internal Medicine

## 2013-11-24 DIAGNOSIS — Z1231 Encounter for screening mammogram for malignant neoplasm of breast: Secondary | ICD-10-CM

## 2013-12-03 ENCOUNTER — Ambulatory Visit (HOSPITAL_COMMUNITY): Payer: 59

## 2013-12-06 ENCOUNTER — Ambulatory Visit (HOSPITAL_COMMUNITY)
Admission: RE | Admit: 2013-12-06 | Discharge: 2013-12-06 | Disposition: A | Discharge status: Home / Self Care | Payer: 59 | Source: Ambulatory Visit | Attending: Internal Medicine | Admitting: Internal Medicine

## 2013-12-06 DIAGNOSIS — Z1231 Encounter for screening mammogram for malignant neoplasm of breast: Secondary | ICD-10-CM

## 2014-01-14 ENCOUNTER — Encounter (HOSPITAL_COMMUNITY): Payer: Self-pay | Admitting: Emergency Medicine

## 2014-01-14 ENCOUNTER — Emergency Department (HOSPITAL_COMMUNITY)
Admission: EM | Admit: 2014-01-14 | Discharge: 2014-01-14 | Disposition: A | Discharge status: Home / Self Care | Payer: 59 | Attending: Emergency Medicine | Admitting: Emergency Medicine

## 2014-01-14 ENCOUNTER — Emergency Department (HOSPITAL_COMMUNITY): Payer: 59

## 2014-01-14 DIAGNOSIS — R011 Cardiac murmur, unspecified: Secondary | ICD-10-CM | POA: Insufficient documentation

## 2014-01-14 DIAGNOSIS — Y9389 Activity, other specified: Secondary | ICD-10-CM | POA: Diagnosis not present

## 2014-01-14 DIAGNOSIS — Y92009 Unspecified place in unspecified non-institutional (private) residence as the place of occurrence of the external cause: Secondary | ICD-10-CM | POA: Insufficient documentation

## 2014-01-14 DIAGNOSIS — S93409A Sprain of unspecified ligament of unspecified ankle, initial encounter: Secondary | ICD-10-CM | POA: Insufficient documentation

## 2014-01-14 DIAGNOSIS — S8990XA Unspecified injury of unspecified lower leg, initial encounter: Secondary | ICD-10-CM | POA: Diagnosis present

## 2014-01-14 DIAGNOSIS — S93402A Sprain of unspecified ligament of left ankle, initial encounter: Secondary | ICD-10-CM

## 2014-01-14 DIAGNOSIS — Z87442 Personal history of urinary calculi: Secondary | ICD-10-CM | POA: Diagnosis not present

## 2014-01-14 DIAGNOSIS — X500XXA Overexertion from strenuous movement or load, initial encounter: Secondary | ICD-10-CM | POA: Insufficient documentation

## 2014-01-14 DIAGNOSIS — E119 Type 2 diabetes mellitus without complications: Secondary | ICD-10-CM | POA: Insufficient documentation

## 2014-01-14 DIAGNOSIS — I10 Essential (primary) hypertension: Secondary | ICD-10-CM | POA: Diagnosis not present

## 2014-01-14 DIAGNOSIS — Z79899 Other long term (current) drug therapy: Secondary | ICD-10-CM | POA: Insufficient documentation

## 2014-01-14 DIAGNOSIS — S99919A Unspecified injury of unspecified ankle, initial encounter: Secondary | ICD-10-CM

## 2014-01-14 DIAGNOSIS — S99929A Unspecified injury of unspecified foot, initial encounter: Secondary | ICD-10-CM

## 2014-01-14 MED ORDER — HYDROCODONE-ACETAMINOPHEN 5-325 MG PO TABS: 1.0000 | ORAL_TABLET | ORAL | Status: DC | PRN

## 2014-01-14 MED ORDER — NABUMETONE 500 MG PO TABS: 500.0000 mg | ORAL_TABLET | Freq: Two times a day (BID) | ORAL | Status: DC

## 2014-01-14 MED ORDER — HYDROCODONE-ACETAMINOPHEN 5-325 MG PO TABS
1.0000 | ORAL_TABLET | Freq: Once | ORAL | Status: AC
  Administered 2014-01-14: 1 via ORAL
  Filled 2014-01-14: qty 1

## 2014-01-14 NOTE — ED Notes (Signed)
Mis-steped off step at home, felt pop in L ankle w/extreme pain following.  Was not able to stand on it afterward.  Came directly to ER.

## 2014-01-14 NOTE — Discharge Instructions (Signed)
Ankle Sprain °An ankle sprain is an injury to the strong, fibrous tissues (ligaments) that hold the bones of your ankle joint together.  °CAUSES °An ankle sprain is usually caused by a fall or by twisting your ankle. Ankle sprains most commonly occur when you step on the outer edge of your foot, and your ankle turns inward. People who participate in sports are more prone to these types of injuries.  °SYMPTOMS  °· Pain in your ankle. The pain may be present at rest or only when you are trying to stand or walk. °· Swelling. °· Bruising. Bruising may develop immediately or within 1 to 2 days after your injury. °· Difficulty standing or walking, particularly when turning corners or changing directions. °DIAGNOSIS  °Your caregiver will ask you details about your injury and perform a physical exam of your ankle to determine if you have an ankle sprain. During the physical exam, your caregiver will press on and apply pressure to specific areas of your foot and ankle. Your caregiver will try to move your ankle in certain ways. An X-ray exam may be done to be sure a bone was not broken or a ligament did not separate from one of the bones in your ankle (avulsion fracture).  °TREATMENT  °Certain types of braces can help stabilize your ankle. Your caregiver can make a recommendation for this. Your caregiver may recommend the use of medicine for pain. If your sprain is severe, your caregiver may refer you to a surgeon who helps to restore function to parts of your skeletal system (orthopedist) or a physical therapist. °HOME CARE INSTRUCTIONS  °· Apply ice to your injury for 1-2 days or as directed by your caregiver. Applying ice helps to reduce inflammation and pain. °¨ Put ice in a plastic bag. °¨ Place a towel between your skin and the bag. °¨ Leave the ice on for 15-20 minutes at a time, every 2 hours while you are awake. °· Only take over-the-counter or prescription medicines for pain, discomfort, or fever as directed by  your caregiver. °· Elevate your injured ankle above the level of your heart as much as possible for 2-3 days. °· If your caregiver recommends crutches, use them as instructed. Gradually put weight on the affected ankle. Continue to use crutches or a cane until you can walk without feeling pain in your ankle. °· If you have a plaster splint, wear the splint as directed by your caregiver. Do not rest it on anything harder than a pillow for the first 24 hours. Do not put weight on it. Do not get it wet. You may take it off to take a shower or bath. °· You may have been given an elastic bandage to wear around your ankle to provide support. If the elastic bandage is too tight (you have numbness or tingling in your foot or your foot becomes cold and blue), adjust the bandage to make it comfortable. °· If you have an air splint, you may blow more air into it or let air out to make it more comfortable. You may take your splint off at night and before taking a shower or bath. Wiggle your toes in the splint several times per day to decrease swelling. °SEEK MEDICAL CARE IF:  °· You have rapidly increasing bruising or swelling. °· Your toes feel extremely cold or you lose feeling in your foot. °· Your pain is not relieved with medicine. °SEEK IMMEDIATE MEDICAL CARE IF: °· Your toes are numb or blue. °·   You have severe pain that is increasing. MAKE SURE YOU:   Understand these instructions.  Will watch your condition.  Will get help right away if you are not doing well or get worse. Document Released: 04/29/2005 Document Revised: 01/22/2012 Document Reviewed: 05/11/2011 [facility] Patient Information 2015 [place], [place]. This information is not intended to replace advice given to you by your health care provider. Make sure you discuss any questions you have with your health care provider.   Wear the ASO and use crutches to avoid weight bearing.  Use ice and elevation as much as possible for the next several days to  help reduce the swelling.  Take the medications prescribed.  You may take the hydrocodone prescribed for pain relief.  This will make you drowsy - do not drive within 4 hours of taking this medication.  Use the relafen also for inflammation.  Call Dr. Durward Fortes for a recheck of your injury in 1 week.  You may benefit from physical therapy of your ankle if it is not getting better over the next week.

## 2014-01-14 NOTE — ED Notes (Signed)
Placed ice pack on patient's left ankle. Patient tolerated well.

## 2014-01-17 NOTE — ED Provider Notes (Signed)
CSN: [id number]     Arrival date & time 01/14/14  1703 History   First MD Initiated Contact with Patient 01/14/14 1705     Chief Complaint  Patient presents with  . Ankle Pain     (Consider location/radiation/quality/duration/timing/severity/associated sxs/prior Treatment) The history is provided by the patient.   Kathy Tran is a 58 y.o. female presenting with pain and an acute "popping" sound in her left ankle pain occuring suddenly when she inverted the ankle while walking.  Pain is aching, constant and worse with palpation, movement.  She has been unable to weight bear since the event which occurred just prior to arrival.   There is no radiation of pain and the patient denies numbness distal to the injury site.  She has had no medications prior to arrival for her pain.     Past Medical History  Diagnosis Date  . Hypertension     x 5 yrs  . Diabetes mellitus type 2, controlled     x5 yrs  . Heart murmur   . Renal disorder     renal stones   Past Surgical History  Procedure Laterality Date  . Tonsillectomy    . Cesarean section  1982, 1986  . Tubal ligation  1991  . Kidney stone surgery      multiple  . Endometrial ablation    . Colonoscopy  03/27/2011    Procedure: COLONOSCOPY;  Surgeon: Rogene Houston, MD;  Location: [facility];  Service: Endoscopy;  Laterality: N/A;  2:00  . Shoulder surgery     Family History  Problem Relation Age of Onset  . COPD Mother   . Cancer Father    History  Substance Use Topics  . Smoking status: Never Smoker   . Smokeless tobacco: Not on file  . Alcohol Use: Yes   OB History   Grav Para Term Preterm Abortions TAB SAB Ect Mult Living                 Review of Systems  Musculoskeletal: Positive for arthralgias and joint swelling.  Skin: Negative for wound.  Neurological: Negative for weakness and numbness.      Allergies  Sulfa antibiotics  Home Medications   Prior to Admission medications   Medication Sig Start  Date End Date Taking? Authorizing Provider  carvedilol (COREG) 25 MG tablet Take 25 mg by mouth 2 (two) times daily with a meal.   Yes Historical Provider, MD  diphenhydrAMINE (BENADRYL) 25 MG tablet Take 25 mg by mouth at bedtime.    Yes Historical Provider, MD  hydrochlorothiazide (HYDRODIURIL) 25 MG tablet Take 1 tablet by mouth daily. 06/28/13  Yes Historical Provider, MD  lactulose (CHRONULAC) 10 GM/15ML solution Take 30 g by mouth daily as needed (constipation).    Yes Historical Provider, MD  metFORMIN (GLUCOPHAGE) 500 MG tablet Take 1,000 mg by mouth 2 (two) times daily with a meal.   Yes Historical Provider, MD  pioglitazone (ACTOS) 15 MG tablet Take 15 mg by mouth daily.   Yes Historical Provider, MD  HYDROcodone-acetaminophen (NORCO/VICODIN) 5-325 MG per tablet Take 1 tablet by mouth every 4 (four) hours as needed. 01/14/14   Evalee Jefferson, PA-C  nabumetone (RELAFEN) 500 MG tablet Take 1 tablet (500 mg total) by mouth 2 (two) times daily. 01/14/14   Evalee Jefferson, PA-C   BP 155/89  Pulse 99  Temp(Src) 98.1 F (36.7 C) (Oral)  Resp 20  Ht 5\' 3"  (1.6 m)  Wt 149  lb (67.586 kg)  BMI 26.40 kg/m2  SpO2 100% Physical Exam  Nursing note and vitals reviewed. Constitutional: She appears well-developed and well-nourished.  HENT:  Head: Normocephalic.  Cardiovascular: Normal rate and intact distal pulses.  Exam reveals no decreased pulses.   Pulses:      Dorsalis pedis pulses are 2+ on the right side, and 2+ on the left side.       Posterior tibial pulses are 2+ on the right side, and 2+ on the left side.  Musculoskeletal: She exhibits edema and tenderness.       Left ankle: She exhibits decreased range of motion and swelling. She exhibits no deformity, no laceration and normal pulse. Tenderness. Lateral malleolus tenderness found. No head of 5th metatarsal and no proximal fibula tenderness found. Achilles tendon normal.  Neurological: She is alert. No sensory deficit.  Skin: Skin is warm, dry and  intact.    ED Course  Procedures (including critical care time) Labs Review Labs Reviewed - No data to display  Imaging Review No results found.   EKG Interpretation None       Dg Ankle Complete Left  01/14/2014   CLINICAL DATA:  Fall with injury and pain in the region of the Achilles tendon.  EXAM: LEFT ANKLE COMPLETE - 3+ VIEW  COMPARISON:  None.  FINDINGS: Corticated bone adjacent to the tip of the lateral malleolus likely represents an old injury. No acute fracture or dislocation is identified. The ankle mortise shows normal alignment. There is suggestion of mild soft tissue swelling. No significant degenerative changes. No bony lesions.  IMPRESSION: No acute fracture identified. Corticated bone adjacent to the tip of the lateral malleolus has the appearance likely of an old injury.   Electronically Signed   By: Aletta Edouard M.D.   On: 01/14/2014 18:02     MDM   Final diagnoses:  Ankle sprain, left, initial encounter    Patients labs and/or radiological studies were viewed and considered during the medical decision making and disposition process. ASO and crutches provided.  Cap refill normal after ASO applied.  RICE, f/u with ortho this week for further eval given questionable corticated bone tip of lat mall - this is site where pt is most tender. Possible avulsion fx.  Her orthopedist is Dr. Durward Fortes.     Evalee Jefferson, PA-C 01/17/14 2228

## 2014-01-27 NOTE — ED Provider Notes (Signed)
Medical screening examination/treatment/procedure(s) were performed by non-physician practitioner and as supervising physician I was immediately available for consultation/collaboration.   EKG Interpretation None          Fredia Sorrow, MD 01/27/14 [phone]

## 2014-02-03 ENCOUNTER — Other Ambulatory Visit (HOSPITAL_COMMUNITY): Payer: Self-pay | Admitting: Orthopaedic Surgery

## 2014-02-03 DIAGNOSIS — M25572 Pain in left ankle and joints of left foot: Secondary | ICD-10-CM

## 2014-02-08 ENCOUNTER — Ambulatory Visit (HOSPITAL_COMMUNITY): Payer: 59

## 2014-02-11 ENCOUNTER — Ambulatory Visit (HOSPITAL_COMMUNITY)
Admission: RE | Admit: 2014-02-11 | Discharge: 2014-02-11 | Disposition: A | Discharge status: Home / Self Care | Payer: 59 | Source: Ambulatory Visit | Attending: Orthopaedic Surgery | Admitting: Orthopaedic Surgery

## 2014-02-11 ENCOUNTER — Encounter (HOSPITAL_COMMUNITY): Payer: Self-pay

## 2014-02-11 DIAGNOSIS — M25572 Pain in left ankle and joints of left foot: Secondary | ICD-10-CM | POA: Insufficient documentation

## 2014-10-17 ENCOUNTER — Encounter (HOSPITAL_COMMUNITY): Payer: Self-pay | Admitting: Emergency Medicine

## 2014-10-17 ENCOUNTER — Emergency Department (HOSPITAL_COMMUNITY): Payer: 59

## 2014-10-17 ENCOUNTER — Emergency Department (HOSPITAL_COMMUNITY)
Admission: EM | Admit: 2014-10-17 | Discharge: 2014-10-17 | Disposition: A | Discharge status: Home / Self Care | Payer: 59 | Attending: Emergency Medicine | Admitting: Emergency Medicine

## 2014-10-17 DIAGNOSIS — R011 Cardiac murmur, unspecified: Secondary | ICD-10-CM | POA: Diagnosis not present

## 2014-10-17 DIAGNOSIS — M5442 Lumbago with sciatica, left side: Secondary | ICD-10-CM

## 2014-10-17 DIAGNOSIS — E119 Type 2 diabetes mellitus without complications: Secondary | ICD-10-CM | POA: Insufficient documentation

## 2014-10-17 DIAGNOSIS — Z8742 Personal history of other diseases of the female genital tract: Secondary | ICD-10-CM | POA: Diagnosis not present

## 2014-10-17 DIAGNOSIS — Z87442 Personal history of urinary calculi: Secondary | ICD-10-CM | POA: Insufficient documentation

## 2014-10-17 DIAGNOSIS — M545 Low back pain: Secondary | ICD-10-CM | POA: Diagnosis present

## 2014-10-17 DIAGNOSIS — Z791 Long term (current) use of non-steroidal anti-inflammatories (NSAID): Secondary | ICD-10-CM | POA: Insufficient documentation

## 2014-10-17 DIAGNOSIS — I10 Essential (primary) hypertension: Secondary | ICD-10-CM | POA: Insufficient documentation

## 2014-10-17 DIAGNOSIS — Z79899 Other long term (current) drug therapy: Secondary | ICD-10-CM | POA: Insufficient documentation

## 2014-10-17 DIAGNOSIS — R109 Unspecified abdominal pain: Secondary | ICD-10-CM

## 2014-10-17 LAB — URINALYSIS, ROUTINE W REFLEX MICROSCOPIC
Bilirubin Urine: NEGATIVE
Glucose, UA: NEGATIVE mg/dL
Hgb urine dipstick: NEGATIVE
KETONES UR: NEGATIVE mg/dL
Leukocytes, UA: NEGATIVE
NITRITE: NEGATIVE
PH: 5.5 (ref 5.0–8.0)
PROTEIN: NEGATIVE mg/dL
Specific Gravity, Urine: 1.01 (ref 1.005–1.030)
UROBILINOGEN UA: 0.2 mg/dL (ref 0.0–1.0)

## 2014-10-17 MED ORDER — KETOROLAC TROMETHAMINE 30 MG/ML IJ SOLN
30.0000 mg | Freq: Once | INTRAMUSCULAR | Status: AC
  Administered 2014-10-17: 30 mg via INTRAVENOUS
  Filled 2014-10-17: qty 1

## 2014-10-17 MED ORDER — SODIUM CHLORIDE 0.9 % IV BOLUS (SEPSIS)
500.0000 mL | Freq: Once | INTRAVENOUS | Status: AC
  Administered 2014-10-17: 500 mL via INTRAVENOUS

## 2014-10-17 MED ORDER — ONDANSETRON HCL 4 MG/2ML IJ SOLN
4.0000 mg | Freq: Once | INTRAMUSCULAR | Status: AC
  Administered 2014-10-17: 4 mg via INTRAVENOUS
  Filled 2014-10-17: qty 2

## 2014-10-17 MED ORDER — PREDNISONE 50 MG PO TABS: ORAL_TABLET | ORAL | Status: DC

## 2014-10-17 MED ORDER — CYCLOBENZAPRINE HCL 10 MG PO TABS: 10.0000 mg | ORAL_TABLET | Freq: Two times a day (BID) | ORAL | Status: DC | PRN

## 2014-10-17 MED ORDER — OXYCODONE-ACETAMINOPHEN 5-325 MG PO TABS: 1.0000 | ORAL_TABLET | Freq: Four times a day (QID) | ORAL | Status: DC | PRN

## 2014-10-17 MED ORDER — HYDROMORPHONE HCL 1 MG/ML IJ SOLN
1.0000 mg | Freq: Once | INTRAMUSCULAR | Status: AC
  Administered 2014-10-17: 1 mg via INTRAVENOUS
  Filled 2014-10-17: qty 1

## 2014-10-17 NOTE — ED Notes (Signed)
Back pain since yesterday.  History of back pain.  Rates pain 7/10.  Have not taken any medication.  Denies injury.

## 2014-10-17 NOTE — ED Provider Notes (Signed)
CSN: [id number]     Arrival date & time 10/17/14  0659 History  This chart was scribed for Nat Christen, MD by Eustaquio Maize, ED Scribe. This patient was seen in room [facility]18[facility]18 and the patient's care was started at 8:09 AM.    Chief Complaint  Patient presents with  . Back Pain   The history is provided by the patient. No language interpreter was used.     HPI Comments: Kathy Tran is a 59 y.o. female who presents to the Emergency Department complaining of bilateral lower back pain, left greater than right, with radiation to left buttocks that began approximately 24 hours ago. Pain is worse with movement. 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  Pt thinks this is a possibility of kidney stones. No fever, chills, urinary symptoms, or any other associated symptoms. No new injury. She has seen Dr. Carloyn Manner (neurosurgeon) for her back pain in the past    Past Medical History  Diagnosis Date  . Hypertension     x 5 yrs  . Diabetes mellitus type 2, controlled     x5 yrs  . Heart murmur   . Renal disorder     renal stones   Past Surgical History  Procedure Laterality Date  . Tonsillectomy    . Cesarean section  1982, 1986  . Tubal ligation  1991  . Kidney stone surgery      multiple  . Endometrial ablation    . Colonoscopy  03/27/2011    Procedure: COLONOSCOPY;  Surgeon: Rogene Houston, MD;  Location: [facility];  Service: Endoscopy;  Laterality: N/A;  2:00  . Shoulder surgery     Family History  Problem Relation Age of Onset  . COPD Mother   . Cancer Father    History  Substance Use Topics  . Smoking status: Never Smoker   . Smokeless tobacco: Not on file  . Alcohol Use: Yes   OB History    No data available     Review of Systems  A complete 10 system review of systems was obtained and all systems are negative except as noted in the HPI and PMH.     Allergies  Sulfa antibiotics  Home Medications   Prior to Admission  medications   Medication Sig Start Date End Date Taking? Authorizing Provider  carvedilol (COREG) 25 MG tablet Take 25 mg by mouth 2 (two) times daily with a meal.   Yes Historical Provider, MD  diphenhydrAMINE (BENADRYL) 25 MG tablet Take 25 mg by mouth at bedtime.    Yes Historical Provider, MD  glimepiride (AMARYL) 1 MG tablet Take 1 tablet by mouth daily. 10/05/14  Yes Historical Provider, MD  hydrochlorothiazide (HYDRODIURIL) 25 MG tablet Take 1 tablet by mouth daily. 06/28/13  Yes Historical Provider, MD  lactulose (CHRONULAC) 10 GM/15ML solution Take 30 g by mouth daily as needed (constipation).    Yes Historical Provider, MD  metFORMIN (GLUCOPHAGE) 500 MG tablet Take 1,000 mg by mouth 2 (two) times daily with a meal.   Yes Historical Provider, MD  cyclobenzaprine (FLEXERIL) 10 MG tablet Take 1 tablet (10 mg total) by mouth 2 (two) times daily as needed for muscle spasms. 10/17/14   Nat Christen, MD  HYDROcodone-acetaminophen (NORCO/VICODIN) 5-325 MG per tablet Take 1 tablet by mouth every 4 (four) hours as needed. Patient not taking: Reported on 10/17/2014 01/14/14   Evalee Jefferson, PA-C  nabumetone (RELAFEN) 500 MG tablet Take  1 tablet (500 mg total) by mouth 2 (two) times daily. Patient not taking: Reported on 10/17/2014 01/14/14   Evalee Jefferson, PA-C  oxyCODONE-acetaminophen (PERCOCET/ROXICET) 5-325 MG per tablet Take 1-2 tablets by mouth every 6 (six) hours as needed. 10/17/14   Nat Christen, MD  predniSONE (DELTASONE) 50 MG tablet 1 tablet daily for 5 days, one half tablet daily for 5 days 10/17/14   Nat Christen, MD   Triage VItals: BP 127/78 mmHg  Pulse 82  Temp(Src) 98.3 F (36.8 C) (Oral)  Resp 18  Ht 5\' 2"  (1.575 m)  Wt 152 lb (68.947 kg)  BMI 27.79 kg/m2  SpO2 99%   Physical Exam  Constitutional: She is oriented to person, place, and time. She appears well-developed and well-nourished.  HENT:  Head: Normocephalic and atraumatic.  Eyes: Conjunctivae and EOM are normal. Pupils are equal, round, and  reactive to light.  Neck: Normal range of motion. Neck supple.  Cardiovascular: Normal rate and regular rhythm.   Pulmonary/Chest: Effort normal and breath sounds normal.  Abdominal: Soft. Bowel sounds are normal.  Musculoskeletal: Normal range of motion.  Tender throughout entire lower back bilaterally.  Pain with straight leg raise, left greater than right.   Neurological: She is alert and oriented to person, place, and time.  Skin: Skin is warm and dry.  Psychiatric: She has a normal mood and affect. Her behavior is normal.  Nursing note and vitals reviewed.   ED Course  Procedures (including critical care time)  DIAGNOSTIC STUDIES: Oxygen Saturation is 99% on RA, normal by my interpretation.    COORDINATION OF CARE: 8:11 AM-Discussed treatment plan which includes CT Renal stone study and pain medication with pt at bedside and pt agreed to plan.   Labs Review Labs Reviewed  URINALYSIS, ROUTINE W REFLEX MICROSCOPIC (NOT AT [facility])    Imaging Review Ct Renal Stone Study  10/17/2014   CLINICAL DATA:  Back pain since yesterday.  Left flank pain.  EXAM: CT ABDOMEN AND PELVIS WITHOUT CONTRAST  TECHNIQUE: Multidetector CT imaging of the abdomen and pelvis was performed following the standard protocol without IV contrast.  COMPARISON:  07/12/2013  FINDINGS: BODY WALL: Stable fatty high left lumbar hernia.  LOWER CHEST: No contributory findings.  ABDOMEN/PELVIS:  Liver: No focal abnormality.  Biliary: No evidence of biliary obstruction or stone.  Pancreas: Unremarkable.  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  Adrenals: Unremarkable.  Kidneys and ureters: 2 mm stone at the interpolar right kidney. No hydronephrosis or ureteral calculus.  Bladder: Limited evaluation given decompressed state. No pathologic findings.  Reproductive: No pathologic findings.  Bowel: No obstruction. No appendicitis.  Retroperitoneum: No mass or  adenopathy.  Peritoneum: No ascites or pneumoperitoneum.  Vascular: No acute abnormality.  OSSEOUS: Diffuse moderate lumbar degenerative disc narrowing. No acute findings.  IMPRESSION: 1. No explanation for acute flank pain. 2. Small and nonobstructive right renal stone.   Electronically Signed   By: Monte Fantasia M.D.   On: 10/17/2014 08:46     EKG Interpretation None      MDM   Final diagnoses:  Left flank pain  Left-sided low back pain with left-sided sciatica   CT scan shows no evidence of a kidney stone. Urinalysis negative. She feels better after pain management. Discharge medications Percocet, Flexeril 10 mg, prednisone.    I personally performed the services described in this documentation, which was scribed in my presence. The recorded  information has been reviewed and is accurate.      Nat Christen, MD 10/17/14 1031

## 2014-10-17 NOTE — Discharge Instructions (Signed)
CT scan showed no evidence of kidney stones. Medication for pain, muscle relaxation, prednisone. Follow-up with Dr. Carloyn Manner if symptoms persist

## 2014-12-02 ENCOUNTER — Other Ambulatory Visit (HOSPITAL_COMMUNITY): Payer: Self-pay | Admitting: Internal Medicine

## 2014-12-02 DIAGNOSIS — Z1231 Encounter for screening mammogram for malignant neoplasm of breast: Secondary | ICD-10-CM

## 2014-12-16 ENCOUNTER — Ambulatory Visit (HOSPITAL_COMMUNITY)
Admission: RE | Admit: 2014-12-16 | Discharge: 2014-12-16 | Disposition: A | Discharge status: Home / Self Care | Payer: 59 | Source: Ambulatory Visit | Attending: Internal Medicine | Admitting: Internal Medicine

## 2014-12-16 DIAGNOSIS — Z1231 Encounter for screening mammogram for malignant neoplasm of breast: Secondary | ICD-10-CM | POA: Diagnosis present

## 2015-01-12 ENCOUNTER — Other Ambulatory Visit (HOSPITAL_COMMUNITY): Payer: Self-pay | Admitting: Internal Medicine

## 2015-01-12 ENCOUNTER — Ambulatory Visit (HOSPITAL_COMMUNITY)
Admission: RE | Admit: 2015-01-12 | Discharge: 2015-01-12 | Disposition: A | Discharge status: Home / Self Care | Payer: 59 | Source: Ambulatory Visit | Attending: Internal Medicine | Admitting: Internal Medicine

## 2015-01-12 DIAGNOSIS — R0602 Shortness of breath: Secondary | ICD-10-CM | POA: Diagnosis present

## 2015-01-12 DIAGNOSIS — R0989 Other specified symptoms and signs involving the circulatory and respiratory systems: Secondary | ICD-10-CM | POA: Insufficient documentation

## 2015-01-12 DIAGNOSIS — R059 Cough, unspecified: Secondary | ICD-10-CM

## 2015-01-12 DIAGNOSIS — R05 Cough: Secondary | ICD-10-CM

## 2015-07-02 ENCOUNTER — Encounter (HOSPITAL_COMMUNITY): Payer: Self-pay | Admitting: Emergency Medicine

## 2015-07-02 ENCOUNTER — Emergency Department (HOSPITAL_COMMUNITY)
Admission: EM | Admit: 2015-07-02 | Discharge: 2015-07-03 | Disposition: A | Discharge status: Home / Self Care | Payer: 59 | Source: Home / Self Care | Attending: Emergency Medicine | Admitting: Emergency Medicine

## 2015-07-02 DIAGNOSIS — E119 Type 2 diabetes mellitus without complications: Secondary | ICD-10-CM

## 2015-07-02 DIAGNOSIS — R9431 Abnormal electrocardiogram [ECG] [EKG]: Secondary | ICD-10-CM | POA: Diagnosis not present

## 2015-07-02 DIAGNOSIS — R109 Unspecified abdominal pain: Secondary | ICD-10-CM

## 2015-07-02 DIAGNOSIS — N200 Calculus of kidney: Secondary | ICD-10-CM

## 2015-07-02 DIAGNOSIS — Z7984 Long term (current) use of oral hypoglycemic drugs: Secondary | ICD-10-CM

## 2015-07-02 DIAGNOSIS — R011 Cardiac murmur, unspecified: Secondary | ICD-10-CM | POA: Insufficient documentation

## 2015-07-02 DIAGNOSIS — Z79899 Other long term (current) drug therapy: Secondary | ICD-10-CM

## 2015-07-02 DIAGNOSIS — I10 Essential (primary) hypertension: Secondary | ICD-10-CM

## 2015-07-02 DIAGNOSIS — Z792 Long term (current) use of antibiotics: Secondary | ICD-10-CM | POA: Insufficient documentation

## 2015-07-02 DIAGNOSIS — N201 Calculus of ureter: Secondary | ICD-10-CM | POA: Diagnosis not present

## 2015-07-02 DIAGNOSIS — Z87891 Personal history of nicotine dependence: Secondary | ICD-10-CM | POA: Diagnosis not present

## 2015-07-02 MED ORDER — OXYCODONE-ACETAMINOPHEN 5-325 MG PO TABS
1.0000 | ORAL_TABLET | Freq: Once | ORAL | Status: AC
  Administered 2015-07-02: 1 via ORAL
  Filled 2015-07-02: qty 1

## 2015-07-02 NOTE — ED Notes (Signed)
Pt given percocet for pain and sent back out to lobby. Pt instructed not to drive on narcotics.

## 2015-07-02 NOTE — ED Notes (Signed)
Pt has long hx of kidney stones and has known kidney stone moving on R side. Pain has increased over past several days w/o help from prescribed tramadol. Alert and oriented.

## 2015-07-03 ENCOUNTER — Ambulatory Visit (HOSPITAL_COMMUNITY): Payer: 59 | Admitting: Certified Registered Nurse Anesthetist

## 2015-07-03 ENCOUNTER — Encounter (HOSPITAL_COMMUNITY): Payer: Self-pay | Admitting: *Deleted

## 2015-07-03 ENCOUNTER — Encounter (HOSPITAL_COMMUNITY)
Admission: RE | Disposition: A | Discharge status: Home / Self Care | Payer: Self-pay | Source: Ambulatory Visit | Attending: Urology

## 2015-07-03 ENCOUNTER — Other Ambulatory Visit: Payer: Self-pay | Admitting: Urology

## 2015-07-03 ENCOUNTER — Ambulatory Visit (HOSPITAL_COMMUNITY)
Admission: RE | Admit: 2015-07-03 | Discharge: 2015-07-03 | Disposition: A | Discharge status: Home / Self Care | Payer: 59 | Source: Ambulatory Visit | Attending: Urology | Admitting: Urology

## 2015-07-03 DIAGNOSIS — N201 Calculus of ureter: Secondary | ICD-10-CM | POA: Diagnosis not present

## 2015-07-03 DIAGNOSIS — R9431 Abnormal electrocardiogram [ECG] [EKG]: Secondary | ICD-10-CM | POA: Diagnosis not present

## 2015-07-03 DIAGNOSIS — Z87891 Personal history of nicotine dependence: Secondary | ICD-10-CM | POA: Insufficient documentation

## 2015-07-03 DIAGNOSIS — I10 Essential (primary) hypertension: Secondary | ICD-10-CM | POA: Diagnosis not present

## 2015-07-03 DIAGNOSIS — E119 Type 2 diabetes mellitus without complications: Secondary | ICD-10-CM | POA: Insufficient documentation

## 2015-07-03 DIAGNOSIS — Z79899 Other long term (current) drug therapy: Secondary | ICD-10-CM | POA: Insufficient documentation

## 2015-07-03 DIAGNOSIS — Z7984 Long term (current) use of oral hypoglycemic drugs: Secondary | ICD-10-CM | POA: Insufficient documentation

## 2015-07-03 HISTORY — PX: CYSTOSCOPY/RETROGRADE/URETEROSCOPY/STONE EXTRACTION WITH BASKET: SHX5317

## 2015-07-03 LAB — URINE MICROSCOPIC-ADD ON

## 2015-07-03 LAB — BASIC METABOLIC PANEL
ANION GAP: 11 (ref 5–15)
BUN: 38 mg/dL — ABNORMAL HIGH (ref 6–20)
CALCIUM: 9.4 mg/dL (ref 8.9–10.3)
CO2: 23 mmol/L (ref 22–32)
Chloride: 103 mmol/L (ref 101–111)
Creatinine, Ser: 0.82 mg/dL (ref 0.44–1.00)
GLUCOSE: 159 mg/dL — AB (ref 65–99)
Potassium: 4.2 mmol/L (ref 3.5–5.1)
SODIUM: 137 mmol/L (ref 135–145)

## 2015-07-03 LAB — URINALYSIS, ROUTINE W REFLEX MICROSCOPIC
Bilirubin Urine: NEGATIVE
Ketones, ur: NEGATIVE mg/dL
Leukocytes, UA: NEGATIVE
NITRITE: NEGATIVE
Protein, ur: NEGATIVE mg/dL
Specific Gravity, Urine: 1.023 (ref 1.005–1.030)
pH: 6 (ref 5.0–8.0)

## 2015-07-03 LAB — I-STAT CREATININE, ED: Creatinine, Ser: 0.6 mg/dL (ref 0.44–1.00)

## 2015-07-03 LAB — GLUCOSE, CAPILLARY: GLUCOSE-CAPILLARY: 149 mg/dL — AB (ref 65–99)

## 2015-07-03 SURGERY — CYSTOSCOPY, WITH CALCULUS REMOVAL USING BASKET
Anesthesia: General | Laterality: Right

## 2015-07-03 MED ORDER — ONDANSETRON 4 MG PO TBDP: 4.0000 mg | ORAL_TABLET | Freq: Three times a day (TID) | ORAL | Status: DC | PRN

## 2015-07-03 MED ORDER — OXYCODONE HCL 5 MG/5ML PO SOLN
5.0000 mg | Freq: Once | ORAL | Status: DC | PRN
  Filled 2015-07-03: qty 5

## 2015-07-03 MED ORDER — FENTANYL CITRATE (PF) 100 MCG/2ML IJ SOLN
INTRAMUSCULAR | Status: AC
  Filled 2015-07-03: qty 2

## 2015-07-03 MED ORDER — PROPOFOL 10 MG/ML IV BOLUS
INTRAVENOUS | Status: DC | PRN
  Administered 2015-07-03: 160 mg via INTRAVENOUS

## 2015-07-03 MED ORDER — LIDOCAINE HCL 2 % EX GEL
CUTANEOUS | Status: AC
Start: 2015-07-03 — End: 2015-07-03
  Filled 2015-07-03: qty 5

## 2015-07-03 MED ORDER — CIPROFLOXACIN IN D5W 400 MG/200ML IV SOLN
INTRAVENOUS | Status: AC
  Filled 2015-07-03: qty 200

## 2015-07-03 MED ORDER — ONDANSETRON HCL 4 MG/2ML IJ SOLN
INTRAMUSCULAR | Status: DC | PRN
  Administered 2015-07-03: 4 mg via INTRAVENOUS

## 2015-07-03 MED ORDER — FENTANYL CITRATE (PF) 100 MCG/2ML IJ SOLN: 25.0000 ug | INTRAMUSCULAR | Status: DC | PRN

## 2015-07-03 MED ORDER — LIDOCAINE HCL (PF) 2 % IJ SOLN
INTRAMUSCULAR | Status: DC | PRN
  Administered 2015-07-03: 75 mg via INTRADERMAL

## 2015-07-03 MED ORDER — SUCCINYLCHOLINE CHLORIDE 20 MG/ML IJ SOLN
INTRAMUSCULAR | Status: DC | PRN
  Administered 2015-07-03: 100 mg via INTRAVENOUS

## 2015-07-03 MED ORDER — IOHEXOL 300 MG/ML  SOLN
INTRAMUSCULAR | Status: DC | PRN
  Administered 2015-07-03: 3 mL via URETHRAL

## 2015-07-03 MED ORDER — ACETAMINOPHEN 160 MG/5ML PO SOLN: 325.0000 mg | ORAL | Status: DC | PRN

## 2015-07-03 MED ORDER — LACTATED RINGERS IV SOLN: INTRAVENOUS | Status: DC

## 2015-07-03 MED ORDER — OXYCODONE HCL 5 MG PO TABS: 5.0000 mg | ORAL_TABLET | Freq: Four times a day (QID) | ORAL | Status: DC | PRN

## 2015-07-03 MED ORDER — EPHEDRINE SULFATE 50 MG/ML IJ SOLN
INTRAMUSCULAR | Status: AC
  Filled 2015-07-03: qty 1

## 2015-07-03 MED ORDER — LACTATED RINGERS IV SOLN
INTRAVENOUS | Status: DC | PRN
  Administered 2015-07-03: 15:00:00 via INTRAVENOUS

## 2015-07-03 MED ORDER — ACETAMINOPHEN 325 MG PO TABS: 325.0000 mg | ORAL_TABLET | ORAL | Status: DC | PRN

## 2015-07-03 MED ORDER — ONDANSETRON HCL 4 MG/2ML IJ SOLN
INTRAMUSCULAR | Status: AC
  Filled 2015-07-03: qty 2

## 2015-07-03 MED ORDER — SODIUM CHLORIDE 0.9 % IJ SOLN
INTRAMUSCULAR | Status: AC
  Filled 2015-07-03: qty 10

## 2015-07-03 MED ORDER — PROPOFOL 10 MG/ML IV BOLUS
INTRAVENOUS | Status: AC
  Filled 2015-07-03: qty 20

## 2015-07-03 MED ORDER — CIPROFLOXACIN IN D5W 400 MG/200ML IV SOLN
400.0000 mg | INTRAVENOUS | Status: AC
  Administered 2015-07-03: 400 mg via INTRAVENOUS

## 2015-07-03 MED ORDER — MIDAZOLAM HCL 2 MG/2ML IJ SOLN
INTRAMUSCULAR | Status: AC
  Filled 2015-07-03: qty 2

## 2015-07-03 MED ORDER — OXYCODONE HCL 5 MG PO TABS: 5.0000 mg | ORAL_TABLET | Freq: Once | ORAL | Status: DC | PRN

## 2015-07-03 MED ORDER — SODIUM CHLORIDE 0.9 % IR SOLN
Status: DC | PRN
  Administered 2015-07-03: 150 mL

## 2015-07-03 MED ORDER — FENTANYL CITRATE (PF) 100 MCG/2ML IJ SOLN
INTRAMUSCULAR | Status: DC | PRN
  Administered 2015-07-03: 100 ug via INTRAVENOUS

## 2015-07-03 MED ORDER — KETOROLAC TROMETHAMINE 30 MG/ML IJ SOLN
30.0000 mg | Freq: Once | INTRAMUSCULAR | Status: AC
  Administered 2015-07-03: 30 mg via INTRAVENOUS
  Filled 2015-07-03: qty 1

## 2015-07-03 MED ORDER — ONDANSETRON HCL 4 MG/2ML IJ SOLN
4.0000 mg | Freq: Once | INTRAMUSCULAR | Status: AC
  Administered 2015-07-03: 4 mg via INTRAVENOUS
  Filled 2015-07-03: qty 2

## 2015-07-03 MED ORDER — BELLADONNA ALKALOIDS-OPIUM 16.2-60 MG RE SUPP
RECTAL | Status: AC
  Filled 2015-07-03: qty 1

## 2015-07-03 MED ORDER — LIDOCAINE HCL (CARDIAC) 20 MG/ML IV SOLN
INTRAVENOUS | Status: AC
  Filled 2015-07-03: qty 5

## 2015-07-03 MED ORDER — MIDAZOLAM HCL 5 MG/5ML IJ SOLN
INTRAMUSCULAR | Status: DC | PRN
  Administered 2015-07-03: 2 mg via INTRAVENOUS

## 2015-07-03 MED ORDER — HYDROMORPHONE HCL 1 MG/ML IJ SOLN: 0.5000 mg | Freq: Once | INTRAMUSCULAR | Status: DC

## 2015-07-03 SURGICAL SUPPLY — 23 items
BAG URO CATCHER STRL LF (MISCELLANEOUS) ×2 IMPLANT
BASKET DAKOTA 1.9FR 11X120 (BASKET) ×1 IMPLANT
BASKET LASER NITINOL 1.9FR (BASKET) IMPLANT
BASKET STONE NCOMPASS (UROLOGICAL SUPPLIES) IMPLANT
BSKT STON RTRVL 120 1.9FR (BASKET)
CATH URET 5FR 28IN OPEN ENDED (CATHETERS) ×1 IMPLANT
CLOTH BEACON ORANGE TIMEOUT ST (SAFETY) ×2 IMPLANT
FIBER LASER FLEXIVA 1000 (UROLOGICAL SUPPLIES) IMPLANT
FIBER LASER FLEXIVA 200 (UROLOGICAL SUPPLIES) IMPLANT
FIBER LASER FLEXIVA 365 (UROLOGICAL SUPPLIES) IMPLANT
FIBER LASER FLEXIVA 550 (UROLOGICAL SUPPLIES) IMPLANT
FIBER LASER TRAC TIP (UROLOGICAL SUPPLIES) IMPLANT
GLOVE SURG SS PI 8.0 STRL IVOR (GLOVE) IMPLANT
GOWN STRL REUS W/TWL XL LVL3 (GOWN DISPOSABLE) ×2 IMPLANT
GUIDEWIRE STR DUAL SENSOR (WIRE) ×2 IMPLANT
IV NS 1000ML (IV SOLUTION) ×2
IV NS 1000ML BAXH (IV SOLUTION) ×1 IMPLANT
IV NS IRRIG 3000ML ARTHROMATIC (IV SOLUTION) ×2 IMPLANT
MANIFOLD NEPTUNE II (INSTRUMENTS) ×2 IMPLANT
PACK CYSTO (CUSTOM PROCEDURE TRAY) ×2 IMPLANT
SHEATH ACCESS URETERAL 38CM (SHEATH) ×1 IMPLANT
SHEATH URET ACCESS 10/12FR (MISCELLANEOUS) IMPLANT
TUBING CONNECTING 10 (TUBING) ×2 IMPLANT

## 2015-07-03 NOTE — Transfer of Care (Signed)
Immediate Anesthesia Transfer of Care Note  Patient: Kathy Tran  Procedure(s) Performed: Procedure(s): CYSTOSCOPY/RETROGRADE/URETEROSCOPY/STONE EXTRACTION WITH BASKET (Right)  Patient Location: PACU  Anesthesia Type:General  Level of Consciousness: awake, sedated and responds to stimulation  Airway & Oxygen Therapy: Patient Spontanous Breathing and Patient connected to face mask oxygen  Post-op Assessment: Report given to RN and Post -op Vital signs reviewed and stable  Post vital signs: Reviewed and stable  Last Vitals:  Filed Vitals:   07/03/15 1349  BP: 136/82  Pulse: 73  Temp: 36.9 C  Resp: 16    Complications: No apparent anesthesia complications

## 2015-07-03 NOTE — Anesthesia Preprocedure Evaluation (Addendum)
Anesthesia Evaluation  Patient identified by MRN, date of birth, ID band Patient awake    Reviewed: Allergy & Precautions, NPO status , Patient's Chart, lab work & pertinent test results  History of Anesthesia Complications Negative for: history of anesthetic complications  Airway Mallampati: III  TM Distance: >3 FB Neck ROM: Full    Dental  (+) Teeth Intact   Pulmonary neg pulmonary ROS,    breath sounds clear to auscultation       Cardiovascular hypertension, Pt. on medications (-) angina(-) CABG and (-) CHF  Rhythm:Regular     Neuro/Psych negative neurological ROS  negative psych ROS   GI/Hepatic negative GI ROS, Neg liver ROS,   Endo/Other  diabetes, Type 2, Oral Hypoglycemic Agents  Renal/GU      Musculoskeletal   Abdominal   Peds  Hematology   Anesthesia Other Findings   Reproductive/Obstetrics                            Anesthesia Physical Anesthesia Plan  ASA: II  Anesthesia Plan: General   Post-op Pain Management:    Induction: Intravenous  Airway Management Planned: Oral ETT  Additional Equipment: None  Intra-op Plan:   Post-operative Plan:   Informed Consent: I have reviewed the patients History and Physical, chart, labs and discussed the procedure including the risks, benefits and alternatives for the proposed anesthesia with the patient or authorized representative who has indicated his/her understanding and acceptance.   Dental advisory given  Plan Discussed with: CRNA and Surgeon  Anesthesia Plan Comments:         Anesthesia Quick Evaluation

## 2015-07-03 NOTE — Brief Op Note (Signed)
07/03/2015  4:08 PM  PATIENT:  Kathy Tran  60 y.o. female  PRE-OPERATIVE DIAGNOSIS:  right distal stone  POST-OPERATIVE DIAGNOSIS:  right distal stone  PROCEDURE:  Procedure(s): CYSTOSCOPY/RETROGRADE/URETEROSCOPY/STONE EXTRACTION WITH BASKET (Right)  SURGEON:  Surgeon(s) and Role:    * Irine Seal, MD - Primary  PHYSICIAN ASSISTANT:   ASSISTANTS: none   ANESTHESIA:   general  EBL:     BLOOD ADMINISTERED:none  DRAINS: none   LOCAL MEDICATIONS USED:  NONE  SPECIMEN:  Source of Specimen:  stone fragments  DISPOSITION OF SPECIMEN:  to patient  COUNTS:  YES  TOURNIQUET:  * No tourniquets in log *  DICTATION: .Other Dictation: Dictation Number [id number]  PLAN OF CARE: Discharge to home after PACU  PATIENT DISPOSITION:  PACU - hemodynamically stable.   Delay start of Pharmacological VTE agent (>24hrs) due to surgical blood loss or risk of bleeding: not applicable

## 2015-07-03 NOTE — H&P (Signed)
Chief Complaint right flank pain   Active Problems Problems  1. Calculus of ureter (N20.1)   Assessed By: Irine Seal (Urology); Last Assessed: 03 Jul 2015  History of Present Illness Kathy Tran returns today with recurrent right flank pain that is severe and associated with N/V. She had to go to the ER this morning and got toradol and zofran with transient relief.  She has urgency and frequency and her recent CT showed a 4-74mm right distal stone with obstruction on 2/14.   Past Medical History Problems  1. History of diabetes mellitus (Z86.39) 2. History of heartburn (Z87.898) 3. History of hypertension (Z86.79)  Surgical History Problems  1. History of Cesarean Section 2. History of Cystoscopy With Insertion Of Ureteral Stent Right 3. History of Cystoscopy With Ureteroscopy With Removal Of Calculus 4. History of Gynecologic Surgery 5. History of Tubal Ligation  Current Meds 1. Benadryl Allergy TABS;  Therapy: (Recorded:31Jan2017) to Recorded 2. Carvedilol 25 MG Oral Tablet;  Therapy: 24Apr2014 to Recorded 3. Glimepiride 2 MG Oral Tablet;  Therapy: (Recorded:31Jan2017) to Recorded 4. HydroCHLOROthiazide 25 MG Oral Tablet; TAKE 1 TABLET DAILY;  Therapy: [id number] to (Last Rx:23May2016)  Requested for: [id number] Ordered 5. Lactulose 10 GM/15ML Oral Solution; 2 Tablespoons with prune juice 1 x weekly;  Therapy: (Recorded:19Jul2012) to Recorded 6. MetFORMIN HCl - 500 MG Oral Tablet; 500mg ; 2 tablets in the morning; 2 tablets in the  evenings;  Therapy: (Recorded:23May2016) to Recorded 7. Metrogel 1 % External Gel;  Therapy: (Recorded:31Jan2017) to Recorded 8. Pravastatin Sodium 10 MG Oral Tablet;  Therapy: (Recorded:31Jan2017) to Recorded 9. TraMADol HCl - 50 MG Oral Tablet; TAKE 1 OR 2 TABLETS BY MOUTH EVERY 4 TO 6  HOURS AS NEEDED;  Therapy: [id number] to (Last Rx:14Feb2017) Ordered 10. Uribel 118 MG Oral Capsule; TAKE 1 CAPSULE 4 times daily;   Therapy: [id number] to  (Evaluate:10Feb2017); Last Rx:31Jan2017 Ordered  Allergies Medication  1. Sulfa Drugs 2. Demerol TABS  Family History Problems  1. Family history of hypertension (Z82.49) : Mother, Father 2. Family history of Nephrolithiasis : Paternal Grandfather 3. Family history of Urinary calculus : Paternal Grandfather  Social History Problems  1. Former smoker [phone])   Smoked socially for a few years; Quit smoking in 2002-2003; Denies any other forms of     tobacco use.  Review of Systems Genitourinary, constitutional, skin, eye, otolaryngeal, hematologic/lymphatic, cardiovascular, pulmonary, endocrine, musculoskeletal, gastrointestinal, neurological and psychiatric system(s) were reviewed and pertinent findings if present are noted and are otherwise negative.  Genitourinary: urinary frequency and urinary urgency.  Gastrointestinal: nausea and flank pain.  Constitutional: no fever.    Vitals Vital Signs [Data Includes: Last 1 Day]  Recorded: 20Feb2017 12:20PM  Blood Pressure: 150 / 84 Temperature: 97.4 F Heart Rate: 85  Physical Exam Constitutional: Well nourished and well developed . Is obvious pain.  Pulmonary: No respiratory distress and normal respiratory rhythm and effort.  Cardiovascular: Heart rate and rhythm are normal . No peripheral edema.    Results/Data Urine [Data Includes: Last 1 Day]   [id number]  COLOR YELLOW   APPEARANCE CLEAR   SPECIFIC GRAVITY >1.030   pH 5.0   GLUCOSE 2+   BILIRUBIN NEGATIVE   KETONE NEGATIVE   BLOOD 1+   PROTEIN TRACE   NITRITE NEGATIVE   LEUKOCYTE ESTERASE NEGATIVE   SQUAMOUS EPITHELIAL/HPF 0-5 HPF  WBC 0-5 WBC/HPF  RBC 10-20 RBC/HPF  BACTERIA FEW HPF  CRYSTALS NONE SEEN HPF  CASTS NONE SEEN LPF  Yeast NONE SEEN HPF  The following images/tracing/specimen were independently visualized:  KUB reviewed and CT reviewed.  The following clinical lab reports were reviewed:  UA reviewed.    Procedure KUB today shows a 43mm  calcification in the right pelvis consistent with the stone seen on CT. She has bilateral phleboliths. There are no other significant bone, gas or soft tissue abnormalities.   Assessment Assessed  1. Calculus of ureter (N20.1) 2. Renal colic ([id number])  Right distal stone with recurrent pain.   Plan Calculus of ureter  1. Administer: Morphine Sulfate 4 MG/ML Injection Solution; INJECT 8  MG Intramuscular;  To Be Done: [id number] 2. Administer: Promethazine HCl 25 MG/ML Injection Solution; INJECT 25 MG  Intramuscular; To Be Done: [id number] 3. Follow-up Schedule Surgery Office  Follow-up  Status: Hold For - Appointment   Requested for: [phone] 4. KUB; Status:Resulted - Requires Verification;   [id number] 11:20AM Health Maintenance  5. UA With REFLEX; [Do Not Release]; Status:Resulted - Requires Verification;   [id number] 11:08AM  I am going to give her morphine 8mg  and phenergan 25mg  IM today and will set her up for right ureteroscopy with stenting. I reviewed the risks of bleeding, infection, ureteral injury, need for a stent or secondary procedures, thrombotic events and anesthetic risks.

## 2015-07-03 NOTE — Anesthesia Procedure Notes (Signed)
Procedure Name: Intubation Date/Time: 07/03/2015 3:48 PM Performed by: Lollie Sails Pre-anesthesia Checklist: Patient identified, Emergency Drugs available, Suction available, Patient being monitored and Timeout performed Patient Re-evaluated:Patient Re-evaluated prior to inductionOxygen Delivery Method: Circle system utilized Preoxygenation: Pre-oxygenation with 100% oxygen Intubation Type: IV induction Ventilation: Mask ventilation without difficulty Laryngoscope Size: Miller and 3 Grade View: Grade I Tube type: Oral Tube size: 7.5 mm Number of attempts: 1 Airway Equipment and Method: Stylet Placement Confirmation: ETT inserted through vocal cords under direct vision,  positive ETCO2 and breath sounds checked- equal and bilateral Secured at: 22 cm Tube secured with: Tape Dental Injury: Teeth and Oropharynx as per pre-operative assessment

## 2015-07-03 NOTE — Discharge Instructions (Signed)
Kidney Stones °Kidney stones (urolithiasis) are deposits that form inside your kidneys. The intense pain is caused by the stone moving through the urinary tract. When the stone moves, the ureter goes into spasm around the stone. The stone is usually passed in the urine.  °CAUSES  °· A disorder that makes certain neck glands produce too much parathyroid hormone (primary hyperparathyroidism). °· A buildup of uric acid crystals, similar to gout in your joints. °· Narrowing (stricture) of the ureter. °· A kidney obstruction present at birth (congenital obstruction). °· Previous surgery on the kidney or ureters. °· Numerous kidney infections. °SYMPTOMS  °· Feeling sick to your stomach (nauseous). °· Throwing up (vomiting). °· Blood in the urine (hematuria). °· Pain that usually spreads (radiates) to the groin. °· Frequency or urgency of urination. °DIAGNOSIS  °· Taking a history and physical exam. °· Blood or urine tests. °· CT scan. °· Occasionally, an examination of the inside of the urinary bladder (cystoscopy) is performed. °TREATMENT  °· Observation. °· Increasing your fluid intake. °· Extracorporeal shock wave lithotripsy--This is a noninvasive procedure that uses shock waves to break up kidney stones. °· Surgery may be needed if you have severe pain or persistent obstruction. There are various surgical procedures. Most of the procedures are performed with the use of small instruments. Only small incisions are needed to accommodate these instruments, so recovery time is minimized. °The size, location, and chemical composition are all important variables that will determine the proper choice of action for you. Talk to your health care provider to better understand your situation so that you will minimize the risk of injury to yourself and your kidney.  °HOME CARE INSTRUCTIONS  °· Drink enough water and fluids to keep your urine clear or pale yellow. This will help you to pass the stone or stone fragments. °· Strain  all urine through the provided strainer. Keep all particulate matter and stones for your health care provider to see. The stone causing the pain may be as small as a grain of salt. It is very important to use the strainer each and every time you pass your urine. The collection of your stone will allow your health care provider to analyze it and verify that a stone has actually passed. The stone analysis will often identify what you can do to reduce the incidence of recurrences. °· Only take over-the-counter or prescription medicines for pain, discomfort, or fever as directed by your health care provider. °· Keep all follow-up visits as told by your health care provider. This is important. °· Get follow-up X-rays if required. The absence of pain does not always mean that the stone has passed. It may have only stopped moving. If the urine remains completely obstructed, it can cause loss of kidney function or even complete destruction of the kidney. It is your responsibility to make sure X-rays and follow-ups are completed. Ultrasounds of the kidney can show blockages and the status of the kidney. Ultrasounds are not associated with any radiation and can be performed easily in a matter of minutes. °· Make changes to your daily diet as told by your health care provider. You may be told to: °¨ Limit the amount of salt that you eat. °¨ Eat 5 or more servings of fruits and vegetables each day. °¨ Limit the amount of meat, poultry, fish, and eggs that you eat. °· Collect a 24-hour urine sample as told by your health care provider. You may need to collect another urine sample every 6-12   months. °SEEK MEDICAL CARE IF: °· You experience pain that is progressive and unresponsive to any pain medicine you have been prescribed. °SEEK IMMEDIATE MEDICAL CARE IF:  °· Pain cannot be controlled with the prescribed medicine. °· You have a fever or shaking chills. °· The severity or intensity of pain increases over 18 hours and is not  relieved by pain medicine. °· You develop a new onset of abdominal pain. °· You feel faint or pass out. °· You are unable to urinate. °  °This information is not intended to replace advice given to you by your health care provider. Make sure you discuss any questions you have with your health care provider. °  °Document Released: 04/29/2005 Document Revised: 01/18/2015 Document Reviewed: 09/30/2012 °Elsevier Interactive Patient Education ©2016 [employer] ° °

## 2015-07-03 NOTE — Anesthesia Postprocedure Evaluation (Signed)
Anesthesia Post Note  Patient: Kathy Tran  Procedure(s) Performed: Procedure(s) (LRB): CYSTOSCOPY/RETROGRADE/URETEROSCOPY/STONE EXTRACTION WITH BASKET (Right)  Patient location during evaluation: PACU Anesthesia Type: General Level of consciousness: awake Pain management: pain level controlled Vital Signs Assessment: post-procedure vital signs reviewed and stable Respiratory status: spontaneous breathing Cardiovascular status: stable Postop Assessment: no signs of nausea or vomiting Anesthetic complications: no    Last Vitals:  Filed Vitals:   07/03/15 1700 07/03/15 1737  BP: 150/77 140/77  Pulse: 73 70  Temp: 36.7 C 36.3 C  Resp: 15 16    Last Pain:  Filed Vitals:   07/03/15 1740  PainSc: 3                  Deseray Daponte

## 2015-07-03 NOTE — ED Provider Notes (Signed)
CSN: [id number]     Arrival date & time 07/02/15  2315 History   First MD Initiated Contact with Patient 07/03/15 0046     Chief Complaint  Patient presents with  . Flank Pain     (Consider location/radiation/quality/duration/timing/severity/associated sxs/prior Treatment) HPI Comments: 60 year old female with a history of hypertension, diabetes mellitus, and kidney stones presents to the ED for evaluation of right flank pain. Patient states that pain began 5 days ago. She was initially seen by her urologist on this day. She reports having a CT scan completed which showed a 3 mm stone. Patient was given "a few shots" and reports feeling better. She was at home and did not start having pain again until today. She reports associated nausea with dry heaves. She also feels the urge to urinate and feels as though she has a decreased stream. She has had no dysuria or hematuria. She does state that she feels "bloated". She took tramadol for symptoms without significant improvement. She does report some improvement in her pain from Percocet received in triage. Patient denies fever. She has a history of 7 surgeries for kidney stones the last of which was in 2010. Her urologist is Dr. Risa Grill. She has f/u scheduled for 07/10/15.  Patient is a 60 y.o. female presenting with flank pain. The history is provided by the patient. No language interpreter was used.  Flank Pain Associated symptoms include abdominal pain and nausea. Pertinent negatives include no fever.    Past Medical History  Diagnosis Date  . Hypertension     x 5 yrs  . Diabetes mellitus type 2, controlled ([place])     x5 yrs  . Heart murmur   . Renal disorder     renal stones   Past Surgical History  Procedure Laterality Date  . Tonsillectomy    . Cesarean section  1982, 1986  . Tubal ligation  1991  . Kidney stone surgery      multiple  . Endometrial ablation    . Colonoscopy  03/27/2011    Procedure: COLONOSCOPY;  Surgeon: Rogene Houston, MD;  Location: [facility];  Service: Endoscopy;  Laterality: N/A;  2:00  . Shoulder surgery     Family History  Problem Relation Age of Onset  . COPD Mother   . Cancer Father    Social History  Substance Use Topics  . Smoking status: Never Smoker   . Smokeless tobacco: None  . Alcohol Use: Yes   OB History    No data available      Review of Systems  Constitutional: Negative for fever.  Gastrointestinal: Positive for nausea and abdominal pain.  Genitourinary: Positive for urgency, flank pain and decreased urine volume. Negative for dysuria and hematuria.  All other systems reviewed and are negative.   Allergies  Sulfa antibiotics  Home Medications   Prior to Admission medications   Medication Sig Start Date End Date Taking? Authorizing Provider  carvedilol (COREG) 25 MG tablet Take 25 mg by mouth 2 (two) times daily with a meal.   Yes Historical Provider, MD  glimepiride (AMARYL) 2 MG tablet Take 2 mg by mouth daily. 06/08/15  Yes Historical Provider, MD  hydrochlorothiazide (HYDRODIURIL) 25 MG tablet Take 25 mg by mouth daily.  06/28/13  Yes Historical Provider, MD  metFORMIN (GLUCOPHAGE-XR) 500 MG 24 hr tablet Take 1,000 mg by mouth 2 (two) times daily. 04/17/15  Yes Historical Provider, MD  metroNIDAZOLE (METROGEL) 0.75 % gel Apply 1 application topically daily.  05/19/15  Yes Historical Provider, MD  pravastatin (PRAVACHOL) 10 MG tablet Take 10 mg by mouth every evening. 06/18/15  Yes Historical Provider, MD  silodosin (RAPAFLO) 8 MG CAPS capsule Take 8 mg by mouth daily with breakfast.   Yes Historical Provider, MD  traMADol (ULTRAM) 50 MG tablet Take 50-100 mg by mouth every 6 (six) hours as needed (for pain.).   Yes Historical Provider, MD  ondansetron (ZOFRAN ODT) 4 MG disintegrating tablet Take 1 tablet (4 mg total) by mouth every 8 (eight) hours as needed for nausea or vomiting. 07/03/15   Antonietta Breach, PA-C   BP 134/76 mmHg  Pulse 82  Temp(Src) 97.6 F (36.4  C) (Oral)  Resp 10  Ht 5\' 2"  (1.575 m)  Wt 68.947 kg  BMI 27.79 kg/m2  SpO2 99%   Physical Exam  Constitutional: She is oriented to person, place, and time. She appears well-developed and well-nourished. No distress.  Nontoxic/nonseptic appearing  HENT:  Head: Normocephalic and atraumatic.  Eyes: Conjunctivae and EOM are normal. No scleral icterus.  Neck: Normal range of motion.  Cardiovascular: Normal rate, regular rhythm and intact distal pulses.   Pulmonary/Chest: Effort normal and breath sounds normal. No respiratory distress. She has no wheezes. She has no rales.  Respirations even and unlabored  Abdominal: Soft. She exhibits no distension. There is tenderness. There is no rebound and no guarding.  Right CVA tenderness noted. Abdomen soft. No masses or peritoneal signs.  Musculoskeletal: Normal range of motion.  Neurological: She is alert and oriented to person, place, and time. She exhibits normal muscle tone. Coordination normal.  Ambulatory with steady gait  Skin: Skin is warm and dry. No rash noted. She is not diaphoretic. No erythema. No pallor.  Psychiatric: She has a normal mood and affect. Her behavior is normal.  Nursing note and vitals reviewed.   ED Course  Procedures (including critical care time) Labs Review Labs Reviewed  URINALYSIS, ROUTINE W REFLEX MICROSCOPIC (NOT AT [facility]) - Abnormal; Notable for the following:    Glucose, UA >1000 (*)    Hgb urine dipstick MODERATE (*)    All other components within normal limits  URINE MICROSCOPIC-ADD ON - Abnormal; Notable for the following:    Squamous Epithelial / LPF 0-5 (*)    Bacteria, UA RARE (*)    Crystals CA OXALATE CRYSTALS (*)    All other components within normal limits  I-STAT CREATININE, ED    Imaging Review No results found. I have personally reviewed and evaluated these images and lab results as part of my medical decision-making.   EKG Interpretation None      MDM   Final diagnoses:   Right flank pain  Kidney stone    Patient presents for worsening R flank pain. Per patient, she was diagnosed with a kidney stone via CT at [facility] 5 days ago. Pain worsened this evening, now well controlled with Toradol and percocet. UA reassuring. Hematuria likely secondary to stone. No pyuria. Kidney function preserved. Patient afebrile today; VSS. Plan to discharge with instructions for urology f/u. Patient states that she has an appointment scheduled in 1 week. Patient is already taking Rapaflo. She has Tramadol at home for pain control which is usually adequate enough for her. Return precautions discussed and provided. Patient discharged in good condition with no unaddressed concerns.   Filed Vitals:   07/02/15 2327 07/03/15 0122 07/03/15 0304  BP: 154/100 146/82 134/76  Pulse: 85 77 82  Temp: 98.8 F (37.1 C)  97.7 F (36.5 C) 97.6 F (36.4 C)  TempSrc: Axillary Oral Oral  Resp: 18 10   Height:  5\' 2"  (1.575 m)   Weight:  68.947 kg   SpO2: 98% 99% 99%     Antonietta Breach, PA-C 07/03/15 0316  Varney Biles, MD 07/04/15 2129

## 2015-07-03 NOTE — Discharge Instructions (Addendum)
CYSTOSCOPY HOME CARE INSTRUCTIONS  Activity: Rest for the remainder of the day.  Do not drive or operate equipment today.  You may resume normal activities in one to two days as instructed by your physician.   Meals: Drink plenty of liquids and eat light foods such as gelatin or soup this evening.  You may return to a normal meal plan tomorrow.  Return to Work: You may return to work in one to two days or as instructed by your physician.  Special Instructions / Symptoms: Call your physician if any of these symptoms occur:   -persistent or heavy bleeding  -bleeding which continues after first few urination  -large blood clots that are difficult to pass  -urine stream diminishes or stops completely  -fever equal to or higher than 101 degrees Farenheit.  -cloudy urine with a strong, foul odor  -severe pain  Females should always wipe from front to back after elimination.  You may feel some burning pain when you urinate.  This should disappear with time.  Applying moist heat to the lower abdomen or a hot tub bath may help relieve the pain. \  Bring your stone to the office at f/u.   Patient Signature:  ________________________________________________________  Nurse's Signature:  ________________________________________________________    General Anesthesia, Adult, Care After Refer to this sheet in the next few weeks. These instructions provide you with information on caring for yourself after your procedure. Your health care provider may also give you more specific instructions. Your treatment has been planned according to current medical practices, but problems sometimes occur. Call your health care provider if you have any problems or questions after your procedure. WHAT TO EXPECT AFTER THE PROCEDURE After the procedure, it is typical to experience:  Sleepiness.  Nausea and vomiting. HOME CARE INSTRUCTIONS  For the first 24 hours after general anesthesia:  Have a responsible  person with you.  Do not drive a car. If you are alone, do not take public transportation.  Do not drink alcohol.  Do not take medicine that has not been prescribed by your health care provider.  Do not sign important papers or make important decisions.  You may resume a normal diet and activities as directed by your health care provider.  Change bandages (dressings) as directed.  If you have questions or problems that seem related to general anesthesia, call the hospital and ask for the anesthetist or anesthesiologist on call. SEEK MEDICAL CARE IF:  You have nausea and vomiting that continue the day after anesthesia.  You develop a rash. SEEK IMMEDIATE MEDICAL CARE IF:   You have difficulty breathing.  You have chest pain.  You have any allergic problems.   This information is not intended to replace advice given to you by your health care provider. Make sure you discuss any questions you have with your health care provider.   Document Released: 08/05/2000 Document Revised: 05/20/2014 Document Reviewed: 08/28/2011 Elsevier Interactive Patient Education [employer].

## 2015-07-03 NOTE — Op Note (Signed)
[name], [name] NO.:  [id number]  MEDICAL RECORD NO.:  [id number]  LOCATION:  [facility]                         FACILITY:  [facility]  PHYSICIAN:  Marshall Cork. Jeffie Pollock, M.D.    DATE OF BIRTH:  1955/12/28  DATE OF PROCEDURE:  07/03/2015 DATE OF DISCHARGE:  07/03/2015                              OPERATIVE REPORT   PROCEDURES: 1. Cystoscopy with right retrograde pyelogram and interpretation. 2. Right ureteroscopic stone extraction.  PREOPERATIVE DIAGNOSIS:  Right distal ureteral stone.  POSTOPERATIVE DIAGNOSIS:  Right distal ureteral stone.  SURGEON:  Marshall Cork. Jeffie Pollock, M.D.  ANESTHESIA:  General.  SPECIMEN:  Stone.  DRAINS:  None.  BLOOD LOSS:  None.  COMPLICATIONS:  None.  INDICATIONS:  [name] is a 60 year old white female with history of stones, who came in this morning in severe right flank pain after second trip to the emergency room.  She has a 4-mm distal stone on CT and it was felt that ureteroscopic stone extraction was indicated.  FINDINGS OF PROCEDURE:  She was taken to the operating room where a general anesthetic was induced.  She was given Cipro.  She was fitted with PAS hose and placed in lithotomy position.  Her perineum and genitalia were prepped with Betadine solution.  She was draped in usual sterile fashion.  Cystoscopy was performed using the 23-French scope and 30-degree lens. Examination revealed a normal urethra.  The bladder wall was smooth and pale without tumor, stones, or inflammation.  The ureteral orifices were unremarkable.  The right ureteral orifice was cannulated with a 5-French open-end catheter and contrast was instilled.  Approximately 1-2 cm proximal meatus, there was a narrowed area with filling defect proximal to this consistent with a stone, above that, there was dilation.  At this point, a 4.5-French semirigid ureteroscope was passed per urethra up the ureteral orifice without difficulty.  Despite the narrowed  appearance on retrograde, the ureter was able to accept the scope.  The stone was identified, it was a crumble calcium oxalate dihydrate-appearing stone.  It was grasped with a [place] basket and removed, it broke into several pieces spontaneously and all the fragments were removed until ureteroscopy to the proximal ureter revealed no residual stone fragments.  At this point, the bladder was drained.  The patient was taken down from lithotomy position.  Her anesthetic was reversed.  She was moved to the recovery room in stable condition.  Her stone fragments were given to her husband.  There were no complications.     Marshall Cork. Jeffie Pollock, M.D.     JJW/MEDQ  D:  07/03/2015  T:  07/03/2015  Job:  [id number]

## 2015-09-28 ENCOUNTER — Other Ambulatory Visit (HOSPITAL_COMMUNITY)
Admission: RE | Admit: 2015-09-28 | Discharge: 2015-09-28 | Disposition: A | Discharge status: Home / Self Care | Payer: 59 | Source: Ambulatory Visit | Attending: Obstetrics & Gynecology | Admitting: Obstetrics & Gynecology

## 2015-09-28 ENCOUNTER — Encounter: Payer: Self-pay | Admitting: Obstetrics & Gynecology

## 2015-09-28 ENCOUNTER — Ambulatory Visit (INDEPENDENT_AMBULATORY_CARE_PROVIDER_SITE_OTHER): Payer: 59 | Admitting: Obstetrics & Gynecology

## 2015-09-28 VITALS — BP 130/90 | HR 80 | Ht 62.2 in | Wt 152.0 lb

## 2015-09-28 DIAGNOSIS — Z1211 Encounter for screening for malignant neoplasm of colon: Secondary | ICD-10-CM

## 2015-09-28 DIAGNOSIS — Z1151 Encounter for screening for human papillomavirus (HPV): Secondary | ICD-10-CM | POA: Diagnosis present

## 2015-09-28 DIAGNOSIS — Z01419 Encounter for gynecological examination (general) (routine) without abnormal findings: Secondary | ICD-10-CM

## 2015-09-28 DIAGNOSIS — Z1212 Encounter for screening for malignant neoplasm of rectum: Secondary | ICD-10-CM

## 2015-09-28 NOTE — Progress Notes (Signed)
Patient ID: Kathy Tran, female   DOB: Jun 09, 1955, 60 y.o.   MRN: [id number] Subjective:     Kathy Tran is a 60 y.o. female here for a routine exam.  No LMP recorded. Patient has had an ablation. No obstetric history on file. Birth Control Method:  BTL, endo ablation Menstrual Calendar(currently): none since ablation  Current complaints: none.   Current acute medical issues:  All stable   Recent Gynecologic History No LMP recorded. Patient has had an ablation. Last Pap: years ago,  normal Last mammogram: 12/2014,  normal  Past Medical History  Diagnosis Date  . Hypertension     x 5 yrs  . Diabetes mellitus type 2, controlled ([place])     x5 yrs  . Heart murmur   . Renal disorder     renal stones    Past Surgical History  Procedure Laterality Date  . Tonsillectomy    . Cesarean section  1982, 1986  . Tubal ligation  1991  . Kidney stone surgery      multiple  . Endometrial ablation    . Colonoscopy  03/27/2011    Procedure: COLONOSCOPY;  Surgeon: Rogene Houston, MD;  Location: [facility];  Service: Endoscopy;  Laterality: N/A;  2:00  . Shoulder surgery    . Cystoscopy/retrograde/ureteroscopy/stone extraction with basket Right 07/03/2015    Procedure: CYSTOSCOPY/RETROGRADE/URETEROSCOPY/STONE EXTRACTION WITH BASKET;  Surgeon: Irine Seal, MD;  Location: [facility];  Service: Urology;  Laterality: Right;    OB History    No data available      Social History   Social History  . Marital Status: Married    Spouse Name: N/A  . Number of Children: N/A  . Years of Education: N/A   Social History Main Topics  . Smoking status: Never Smoker   . Smokeless tobacco: None  . Alcohol Use: Yes  . Drug Use: No  . Sexual Activity: Yes    Birth Control/ Protection: Surgical   Other Topics Concern  . None   Social History Narrative    Family History  Problem Relation Age of Onset  . COPD Mother   . Cancer Father      Current outpatient prescriptions:  .  carvedilol  (COREG) 25 MG tablet, Take 25 mg by mouth 2 (two) times daily with a meal., Disp: , Rfl:  .  doxycycline (DORYX) 100 MG EC tablet, Take 100 mg by mouth 2 (two) times daily., Disp: , Rfl:  .  glimepiride (AMARYL) 2 MG tablet, Take 2 mg by mouth daily., Disp: , Rfl:  .  hydrochlorothiazide (HYDRODIURIL) 25 MG tablet, Take 25 mg by mouth daily. , Disp: , Rfl:  .  meloxicam (MOBIC) 15 MG tablet, Take 15 mg by mouth daily., Disp: , Rfl:  .  metFORMIN (GLUCOPHAGE-XR) 500 MG 24 hr tablet, Take 1,000 mg by mouth 2 (two) times daily., Disp: , Rfl:  .  metroNIDAZOLE (METROGEL) 0.75 % gel, Apply 1 application topically daily., Disp: , Rfl:  .  pravastatin (PRAVACHOL) 10 MG tablet, Take 10 mg by mouth every evening., Disp: , Rfl:  .  traMADol (ULTRAM) 50 MG tablet, Take 50-100 mg by mouth every 6 (six) hours as needed (for pain.). Reported on 09/28/2015, Disp: , Rfl:   Review of Systems  Review of Systems  Constitutional: Negative for fever, chills, weight loss, malaise/fatigue and diaphoresis.  HENT: Negative for hearing loss, ear pain, nosebleeds, congestion, sore throat, neck pain, tinnitus and ear discharge.  Eyes: Negative for blurred vision, double vision, photophobia, pain, discharge and redness.  Respiratory: Negative for cough, hemoptysis, sputum production, shortness of breath, wheezing and stridor.   Cardiovascular: Negative for chest pain, palpitations, orthopnea, claudication, leg swelling and PND.  Gastrointestinal: negative for abdominal pain. Negative for heartburn, nausea, vomiting, diarrhea, constipation, blood in stool and melena.  Genitourinary: Negative for dysuria, urgency, frequency, hematuria and flank pain.  Musculoskeletal: Negative for myalgias, back pain, joint pain and falls.  Skin: Negative for itching and rash.  Neurological: Negative for dizziness, tingling, tremors, sensory change, speech change, focal weakness, seizures, loss of consciousness, weakness and headaches.   Endo/Heme/Allergies: Negative for environmental allergies and polydipsia. Does not bruise/bleed easily.  Psychiatric/Behavioral: Negative for depression, suicidal ideas, hallucinations, memory loss and substance abuse. The patient is not nervous/anxious and does not have insomnia.        Objective:  Blood pressure 130/90, pulse 80, height 5' 2.2" (1.58 m), weight 152 lb (68.947 kg).   Physical Exam  Vitals reviewed. Constitutional: She is oriented to person, place, and time. She appears well-developed and well-nourished.        Vulva is normal without lesions Vagina is pink moist without discharge Cervix normal in appearance and pap is done Uterus is normal size shape and contour Adnexa is negative with normal sized ovaries  {Rectal    hemoccult negative, normal tone, no masses  Musculoskeletal: Normal range of motion. She exhibits no edema and no tenderness.  Neurological: She is alert and oriented to person, place, and time. She has normal reflexes. She displays normal reflexes. No cranial nerve deficit. She exhibits normal muscle tone. Coordination normal.  Skin: Skin is warm and dry. No rash noted. No erythema. No pallor.  Psychiatric: She has a normal mood and affect. Her behavior is normal. Judgment and thought content normal.       Medications Ordered at today's visit: Meds ordered this encounter  Medications  . meloxicam (MOBIC) 15 MG tablet    Sig: Take 15 mg by mouth daily.  Marland Kitchen doxycycline (DORYX) 100 MG EC tablet    Sig: Take 100 mg by mouth 2 (two) times daily.    Other orders placed at today's visit: No orders of the defined types were placed in this encounter.      Assessment:    Healthy female exam.    Plan:    Follow up in: 3 years.     Return in about 3 years (around 09/28/2018) for yearly, with Dr Elonda Husky.

## 2015-10-02 LAB — CYTOLOGY - PAP

## 2015-11-07 ENCOUNTER — Encounter (HOSPITAL_COMMUNITY): Payer: Self-pay | Admitting: Emergency Medicine

## 2015-11-07 ENCOUNTER — Emergency Department (HOSPITAL_COMMUNITY)
Admission: EM | Admit: 2015-11-07 | Discharge: 2015-11-07 | Disposition: A | Discharge status: Home / Self Care | Payer: 59 | Attending: Emergency Medicine | Admitting: Emergency Medicine

## 2015-11-07 ENCOUNTER — Emergency Department (HOSPITAL_COMMUNITY): Payer: 59

## 2015-11-07 DIAGNOSIS — Z79899 Other long term (current) drug therapy: Secondary | ICD-10-CM | POA: Diagnosis not present

## 2015-11-07 DIAGNOSIS — I10 Essential (primary) hypertension: Secondary | ICD-10-CM | POA: Diagnosis not present

## 2015-11-07 DIAGNOSIS — Z7984 Long term (current) use of oral hypoglycemic drugs: Secondary | ICD-10-CM | POA: Insufficient documentation

## 2015-11-07 DIAGNOSIS — R109 Unspecified abdominal pain: Secondary | ICD-10-CM | POA: Diagnosis present

## 2015-11-07 DIAGNOSIS — N200 Calculus of kidney: Secondary | ICD-10-CM

## 2015-11-07 DIAGNOSIS — E119 Type 2 diabetes mellitus without complications: Secondary | ICD-10-CM | POA: Diagnosis not present

## 2015-11-07 DIAGNOSIS — N201 Calculus of ureter: Secondary | ICD-10-CM | POA: Insufficient documentation

## 2015-11-07 LAB — CBC WITH DIFFERENTIAL/PLATELET
Basophils Absolute: 0 K/uL (ref 0.0–0.1)
Basophils Relative: 0 %
Eosinophils Absolute: 0.1 K/uL (ref 0.0–0.7)
Eosinophils Relative: 3 %
HCT: 38.6 % (ref 36.0–46.0)
Hemoglobin: 13.1 g/dL (ref 12.0–15.0)
Lymphocytes Relative: 35 %
Lymphs Abs: 1.7 K/uL (ref 0.7–4.0)
MCH: 32.3 pg (ref 26.0–34.0)
MCHC: 33.9 g/dL (ref 30.0–36.0)
MCV: 95.3 fL (ref 78.0–100.0)
Monocytes Absolute: 0.3 K/uL (ref 0.1–1.0)
Monocytes Relative: 7 %
Neutro Abs: 2.7 K/uL (ref 1.7–7.7)
Neutrophils Relative %: 55 %
Platelets: 189 K/uL (ref 150–400)
RBC: 4.05 MIL/uL (ref 3.87–5.11)
RDW: 11.7 % (ref 11.5–15.5)
WBC: 4.9 K/uL (ref 4.0–10.5)

## 2015-11-07 LAB — URINALYSIS, ROUTINE W REFLEX MICROSCOPIC
Bilirubin Urine: NEGATIVE
Glucose, UA: NEGATIVE mg/dL
Ketones, ur: NEGATIVE mg/dL
Nitrite: NEGATIVE
Protein, ur: NEGATIVE mg/dL
Specific Gravity, Urine: 1.01 (ref 1.005–1.030)
pH: 6.5 (ref 5.0–8.0)

## 2015-11-07 LAB — URINE MICROSCOPIC-ADD ON

## 2015-11-07 LAB — COMPREHENSIVE METABOLIC PANEL WITH GFR
ALT: 18 U/L (ref 14–54)
AST: 20 U/L (ref 15–41)
Albumin: 4.1 g/dL (ref 3.5–5.0)
Alkaline Phosphatase: 41 U/L (ref 38–126)
Anion gap: 7 (ref 5–15)
BUN: 20 mg/dL (ref 6–20)
CO2: 25 mmol/L (ref 22–32)
Calcium: 9.2 mg/dL (ref 8.9–10.3)
Chloride: 105 mmol/L (ref 101–111)
Creatinine, Ser: 0.65 mg/dL (ref 0.44–1.00)
GFR calc Af Amer: >60 mL/min
GFR calc non Af Amer: >60 mL/min
Glucose, Bld: 146 mg/dL — ABNORMAL HIGH (ref 65–99)
Potassium: 3.7 mmol/L (ref 3.5–5.1)
Sodium: 137 mmol/L (ref 135–145)
Total Bilirubin: 0.7 mg/dL (ref 0.3–1.2)
Total Protein: 6.6 g/dL (ref 6.5–8.1)

## 2015-11-07 MED ORDER — OXYCODONE-ACETAMINOPHEN 5-325 MG PO TABS: 1.0000 | ORAL_TABLET | ORAL | Status: DC | PRN

## 2015-11-07 MED ORDER — SODIUM CHLORIDE 0.9 % IV SOLN
1000.0000 mL | Freq: Once | INTRAVENOUS | Status: AC
  Administered 2015-11-07: 1000 mL via INTRAVENOUS

## 2015-11-07 MED ORDER — SODIUM CHLORIDE 0.9 % IV SOLN
1000.0000 mL | INTRAVENOUS | Status: DC
  Administered 2015-11-07: 1000 mL via INTRAVENOUS

## 2015-11-07 MED ORDER — HYDROMORPHONE HCL 1 MG/ML IJ SOLN
1.0000 mg | Freq: Once | INTRAMUSCULAR | Status: AC
  Administered 2015-11-07: 1 mg via INTRAVENOUS
  Filled 2015-11-07: qty 1

## 2015-11-07 MED ORDER — METOCLOPRAMIDE HCL 5 MG/ML IJ SOLN
10.0000 mg | Freq: Once | INTRAMUSCULAR | Status: AC
  Administered 2015-11-07: 10 mg via INTRAVENOUS
  Filled 2015-11-07: qty 2

## 2015-11-07 MED ORDER — TAMSULOSIN HCL 0.4 MG PO CAPS: 0.4000 mg | ORAL_CAPSULE | Freq: Every day | ORAL | Status: DC

## 2015-11-07 MED ORDER — METOCLOPRAMIDE HCL 10 MG PO TABS: 10.0000 mg | ORAL_TABLET | Freq: Four times a day (QID) | ORAL | Status: DC | PRN

## 2015-11-07 MED ORDER — DIPHENHYDRAMINE HCL 50 MG/ML IJ SOLN
25.0000 mg | Freq: Once | INTRAMUSCULAR | Status: AC
  Administered 2015-11-07: 25 mg via INTRAVENOUS
  Filled 2015-11-07: qty 1

## 2015-11-07 MED ORDER — ONDANSETRON HCL 4 MG/2ML IJ SOLN
4.0000 mg | Freq: Once | INTRAMUSCULAR | Status: AC
  Administered 2015-11-07: 4 mg via INTRAVENOUS
  Filled 2015-11-07: qty 2

## 2015-11-07 NOTE — ED Provider Notes (Signed)
CSN: [id number]     Arrival date & time 11/07/15  0353 History   First MD Initiated Contact with Patient 11/07/15 0400     Chief Complaint  Patient presents with  . Flank Pain     (Consider location/radiation/quality/duration/timing/severity/associated sxs/prior Treatment) Patient is a 60 y.o. female presenting with flank pain. The history is provided by the patient.  Flank Pain  She has history of diabetes, hypertension, and multiple kidney stones. She has had nausea for the last 5 days. Tonight, she started having severe left flank pain with radiation to left lower abdomen. She rates pain at 10/10. There is associated nausea with vomiting. She denies fever, chills, sweats. She did see her urologist yesterday he took a plain x-ray showing stone in each kidney. She thinks she had passed a kidney stone about 2 weeks ago. She's had multiple surgeries to retrieve kidney stones.  Past Medical History  Diagnosis Date  . Hypertension     x 5 yrs  . Diabetes mellitus type 2, controlled ([place])     x5 yrs  . Heart murmur   . Renal disorder     renal stones   Past Surgical History  Procedure Laterality Date  . Tonsillectomy    . Cesarean section  1982, 1986  . Tubal ligation  1991  . Kidney stone surgery      multiple  . Endometrial ablation    . Colonoscopy  03/27/2011    Procedure: COLONOSCOPY;  Surgeon: Rogene Houston, MD;  Location: [facility];  Service: Endoscopy;  Laterality: N/A;  2:00  . Shoulder surgery    . Cystoscopy/retrograde/ureteroscopy/stone extraction with basket Right 07/03/2015    Procedure: CYSTOSCOPY/RETROGRADE/URETEROSCOPY/STONE EXTRACTION WITH BASKET;  Surgeon: Irine Seal, MD;  Location: [facility];  Service: Urology;  Laterality: Right;   Family History  Problem Relation Age of Onset  . COPD Mother   . Cancer Father    Social History  Substance Use Topics  . Smoking status: Never Smoker   . Smokeless tobacco: None  . Alcohol Use: Yes   OB History    No  data available     Review of Systems  Genitourinary: Positive for flank pain.  All other systems reviewed and are negative.     Allergies  Sulfa antibiotics  Home Medications   Prior to Admission medications   Medication Sig Start Date End Date Taking? Authorizing Provider  carvedilol (COREG) 25 MG tablet Take 25 mg by mouth 2 (two) times daily with a meal.   Yes Historical Provider, MD  glimepiride (AMARYL) 2 MG tablet Take 2 mg by mouth daily. 06/08/15  Yes Historical Provider, MD  hydrochlorothiazide (HYDRODIURIL) 25 MG tablet Take 25 mg by mouth daily.  06/28/13  Yes Historical Provider, MD  meloxicam (MOBIC) 15 MG tablet Take 15 mg by mouth daily.   Yes Historical Provider, MD  metFORMIN (GLUCOPHAGE-XR) 500 MG 24 hr tablet Take 1,000 mg by mouth 2 (two) times daily. 04/17/15  Yes Historical Provider, MD  metroNIDAZOLE (METROGEL) 0.75 % gel Apply 1 application topically daily. 05/19/15  Yes Historical Provider, MD  pravastatin (PRAVACHOL) 10 MG tablet Take 10 mg by mouth every evening. 06/18/15  Yes Historical Provider, MD  traMADol (ULTRAM) 50 MG tablet Take 50-100 mg by mouth every 6 (six) hours as needed (for pain.). Reported on 09/28/2015   Yes Historical Provider, MD  doxycycline (DORYX) 100 MG EC tablet Take 100 mg by mouth 2 (two) times daily.    Historical Provider,  MD   BP 156/94 mmHg  Pulse 84  Temp(Src) 97.6 F (36.4 C) (Oral)  Resp 22  Ht 5' 2.5" (1.588 m)  Wt 149 lb (67.586 kg)  BMI 26.80 kg/m2  SpO2 100% Physical Exam  Nursing note and vitals reviewed.  60 year old female, in obvious pain, but in no acute distress. Vital signs are significant for hypertension and tachypnea. Oxygen saturation is 100%, which is normal. Head is normocephalic and atraumatic. PERRLA, EOMI. Oropharynx is clear. Neck is nontender and supple without adenopathy or JVD. Back is nontender in the midline. There is marked left CVA tenderness. Lungs are clear without rales, wheezes, or  rhonchi. Chest is nontender. Heart has regular rate and rhythm without murmur. Abdomen is soft, flat, with moderate left lower quadrant tenderness. There is no rebound or guarding. There are no masses or hepatosplenomegaly and peristalsis is hypoactive. Extremities have no cyanosis or edema, full range of motion is present. Skin is warm and dry without rash. Neurologic: Mental status is normal, cranial nerves are intact, there are no motor or sensory deficits.  ED Course  Procedures (including critical care time) Labs Review Results for orders placed or performed during the hospital encounter of 11/07/15  Urinalysis, Routine w reflex microscopic  Result Value Ref Range   Color, Urine GREEN (A) YELLOW   APPearance CLEAR CLEAR   Specific Gravity, Urine 1.010 1.005 - 1.030   pH 6.5 5.0 - 8.0   Glucose, UA NEGATIVE NEGATIVE mg/dL   Hgb urine dipstick LARGE (A) NEGATIVE   Bilirubin Urine NEGATIVE NEGATIVE   Ketones, ur NEGATIVE NEGATIVE mg/dL   Protein, ur NEGATIVE NEGATIVE mg/dL   Nitrite NEGATIVE NEGATIVE   Leukocytes, UA TRACE (A) NEGATIVE  Comprehensive metabolic panel  Result Value Ref Range   Sodium 137 135 - 145 mmol/L   Potassium 3.7 3.5 - 5.1 mmol/L   Chloride 105 101 - 111 mmol/L   CO2 25 22 - 32 mmol/L   Glucose, Bld 146 (H) 65 - 99 mg/dL   BUN 20 6 - 20 mg/dL   Creatinine, Ser 0.65 0.44 - 1.00 mg/dL   Calcium 9.2 8.9 - 10.3 mg/dL   Total Protein 6.6 6.5 - 8.1 g/dL   Albumin 4.1 3.5 - 5.0 g/dL   AST 20 15 - 41 U/L   ALT 18 14 - 54 U/L   Alkaline Phosphatase 41 38 - 126 U/L   Total Bilirubin 0.7 0.3 - 1.2 mg/dL   GFR calc non Af Amer >60 >60 mL/min   GFR calc Af Amer >60 >60 mL/min   Anion gap 7 5 - 15  CBC with Differential  Result Value Ref Range   WBC 4.9 4.0 - 10.5 K/uL   RBC 4.05 3.87 - 5.11 MIL/uL   Hemoglobin 13.1 12.0 - 15.0 g/dL   HCT 38.6 36.0 - 46.0 %   MCV 95.3 78.0 - 100.0 fL   MCH 32.3 26.0 - 34.0 pg   MCHC 33.9 30.0 - 36.0 g/dL   RDW 11.7 11.5  - 15.5 %   Platelets 189 150 - 400 K/uL   Neutrophils Relative % 55 %   Neutro Abs 2.7 1.7 - 7.7 K/uL   Lymphocytes Relative 35 %   Lymphs Abs 1.7 0.7 - 4.0 K/uL   Monocytes Relative 7 %   Monocytes Absolute 0.3 0.1 - 1.0 K/uL   Eosinophils Relative 3 %   Eosinophils Absolute 0.1 0.0 - 0.7 K/uL   Basophils Relative 0 %  Basophils Absolute 0.0 0.0 - 0.1 K/uL  Urine microscopic-add on  Result Value Ref Range   Squamous Epithelial / LPF 0-5 (A) NONE SEEN   WBC, UA 0-5 0 - 5 WBC/hpf   RBC / HPF TOO NUMEROUS TO COUNT 0 - 5 RBC/hpf   Bacteria, UA FEW (A) NONE SEEN   Casts GRANULAR CAST (A) NEGATIVE    Imaging Review Ct Renal Stone Study  11/07/2015  CLINICAL DATA:  LEFT flank pain, diagnosed with kidney stones yesterday. Patient reports approximate 300 kidney stones in life time. History of hypertension and diabetes. EXAM: CT ABDOMEN AND PELVIS WITHOUT CONTRAST TECHNIQUE: Multidetector CT imaging of the abdomen and pelvis was performed following the standard protocol without IV contrast. COMPARISON:  Abdominal radiograph November 06, 2015 and CT abdomen and pelvis June 27, 2015 FINDINGS: LUNG BASES: Included view of the lung bases are clear. The visualized heart and pericardium are unremarkable. KIDNEYS/BLADDER: Kidneys are orthotopic, demonstrating normal size and morphology. Mild LEFT hydroureteronephrosis to the level of the ureterovesicular junction where a 3 mm calculus is present. Punctate LEFT lower pole nephrolithiasis. 2 mm RIGHT interpolar nephrolithiasis without obstructive uropathy. Limited assessment for renal masses on this nonenhanced examination. The unopacified ureters are normal in course and caliber. Urinary bladder is partially distended and unremarkable. SOLID ORGANS: The liver, spleen, gallbladder, pancreas and adrenal glands are unremarkable for this non-contrast examination. GASTROINTESTINAL TRACT: The stomach, small and large bowel are normal in course and caliber without  inflammatory changes, the sensitivity may be decreased by lack of enteric contrast. Mild amount of retained large bowel stool. Normal appendix. PERITONEUM/RETROPERITONEUM: Aortoiliac vessels are normal in course and caliber, trace calcific atherosclerosis. No lymphadenopathy by CT size criteria. Internal reproductive organs are unremarkable. No intraperitoneal free fluid nor free air. Phleboliths in the pelvis. SOFT TISSUES/ OSSEOUS STRUCTURES: Nonsuspicious. Moderate L4-5 and L5-S1 degenerative discs with vacuum phenomena. IMPRESSION: 3 mm LEFT UVJ calculus resulting in mild obstructive uropathy. Residual punctate LEFT lower pole nephrolithiasis. 2 mm nonobstructing RIGHT nephrolithiasis. Electronically Signed   By: Elon Alas M.D.   On: 11/07/2015 05:44   I have personally reviewed and evaluated these images and lab results as part of my medical decision-making.  MDM   Final diagnoses:  Left flank pain  Ureterolithiasis  Nephrolithiasis    Left flank pain which most likely represents a ureteral colic. She is started on IV hydration and is given hydromorphone for pain. Old records reviewed confirming ED visits for kidney stones admissions for surgical management of kidney stones. He is able to see the KUB done yesterday but I do not feel this shows sufficient detail. She will be sent for renal stone protocol CT scan.  CT shows a 3 mm calculus at the right ureterovesical junction as well as 2 small calculi in the left kidney and one small calculus in the right kidney. Patient's pain was somewhat difficult to control and she required several doses of hydromorphone. Ondansetron helped the nausea initially but she had breakthrough vomiting and was treated with metoclopramide. At this point, she seems to be doing better and will be discharged with prescriptions for oxycodone-acetaminophen, tamsulosin, and metoclopramide.    Delora Fuel, [place] [phone] [id number]

## 2015-11-07 NOTE — Discharge Instructions (Signed)
Kidney Stones Kidney stones (urolithiasis) are deposits that form inside your kidneys. The intense pain is caused by the stone moving through the urinary tract. When the stone moves, the ureter goes into spasm around the stone. The stone is usually passed in the urine.  CAUSES   A disorder that makes certain neck glands produce too much parathyroid hormone (primary hyperparathyroidism).  A buildup of uric acid crystals, similar to gout in your joints.  Narrowing (stricture) of the ureter.  A kidney obstruction present at birth (congenital obstruction).  Previous surgery on the kidney or ureters.  Numerous kidney infections. SYMPTOMS   Feeling sick to your stomach (nauseous).  Throwing up (vomiting).  Blood in the urine (hematuria).  Pain that usually spreads (radiates) to the groin.  Frequency or urgency of urination. DIAGNOSIS   Taking a history and physical exam.  Blood or urine tests.  CT scan.  Occasionally, an examination of the inside of the urinary bladder (cystoscopy) is performed. TREATMENT   Observation.  Increasing your fluid intake.  Extracorporeal shock wave lithotripsy--This is a noninvasive procedure that uses shock waves to break up kidney stones.  Surgery may be needed if you have severe pain or persistent obstruction. There are various surgical procedures. Most of the procedures are performed with the use of small instruments. Only small incisions are needed to accommodate these instruments, so recovery time is minimized. The size, location, and chemical composition are all important variables that will determine the proper choice of action for you. Talk to your health care provider to better understand your situation so that you will minimize the risk of injury to yourself and your kidney.  HOME CARE INSTRUCTIONS   Drink enough water and fluids to keep your urine clear or pale yellow. This will help you to pass the stone or stone fragments.  Strain  all urine through the provided strainer. Keep all particulate matter and stones for your health care provider to see. The stone causing the pain may be as small as a grain of salt. It is very important to use the strainer each and every time you pass your urine. The collection of your stone will allow your health care provider to analyze it and verify that a stone has actually passed. The stone analysis will often identify what you can do to reduce the incidence of recurrences.  Only take over-the-counter or prescription medicines for pain, discomfort, or fever as directed by your health care provider.  Keep all follow-up visits as told by your health care provider. This is important.  Get follow-up X-rays if required. The absence of pain does not always mean that the stone has passed. It may have only stopped moving. If the urine remains completely obstructed, it can cause loss of kidney function or even complete destruction of the kidney. It is your responsibility to make sure X-rays and follow-ups are completed. Ultrasounds of the kidney can show blockages and the status of the kidney. Ultrasounds are not associated with any radiation and can be performed easily in a matter of minutes.  Make changes to your daily diet as told by your health care provider. You may be told to:  Limit the amount of salt that you eat.  Eat 5 or more servings of fruits and vegetables each day.  Limit the amount of meat, poultry, fish, and eggs that you eat.  Collect a 24-hour urine sample as told by your health care provider.You may need to collect another urine sample every 6-12  months. SEEK MEDICAL CARE IF:  You experience pain that is progressive and unresponsive to any pain medicine you have been prescribed. SEEK IMMEDIATE MEDICAL CARE IF:   Pain cannot be controlled with the prescribed medicine.  You have a fever or shaking chills.  The severity or intensity of pain increases over 18 hours and is not  relieved by pain medicine.  You develop a new onset of abdominal pain.  You feel faint or pass out.  You are unable to urinate.   This information is not intended to replace advice given to you by your health care provider. Make sure you discuss any questions you have with your health care provider.   Document Released: 04/29/2005 Document Revised: 01/18/2015 Document Reviewed: 09/30/2012 Elsevier Interactive Patient Education 2016 [employer]  Acetaminophen; Oxycodone tablets What is this medicine? ACETAMINOPHEN; OXYCODONE (a set a MEE noe fen; ox i KOE done) is a pain reliever. It is used to treat moderate to severe pain. This medicine may be used for other purposes; ask your health care provider or pharmacist if you have questions. What should I tell my health care provider before I take this medicine? They need to know if you have any of these conditions: -brain tumor -Crohn's disease, inflammatory bowel disease, or ulcerative colitis -drug abuse or addiction -head injury -heart or circulation problems -if you often drink alcohol -kidney disease or problems going to the bathroom -liver disease -lung disease, asthma, or breathing problems -an unusual or allergic reaction to acetaminophen, oxycodone, other opioid analgesics, other medicines, foods, dyes, or preservatives -pregnant or trying to get pregnant -breast-feeding How should I use this medicine? Take this medicine by mouth with a full glass of water. Follow the directions on the prescription label. You can take it with or without food. If it upsets your stomach, take it with food. Take your medicine at regular intervals. Do not take it more often than directed. Talk to your pediatrician regarding the use of this medicine in children. Special care may be needed. Patients over 69 years old may have a stronger reaction and need a smaller dose. Overdosage: If you think you have taken too much of this medicine contact a  poison control center or emergency room at once. NOTE: This medicine is only for you. Do not share this medicine with others. What if I miss a dose? If you miss a dose, take it as soon as you can. If it is almost time for your next dose, take only that dose. Do not take double or extra doses. What may interact with this medicine? -alcohol -antihistamines -barbiturates like amobarbital, butalbital, butabarbital, methohexital, pentobarbital, phenobarbital, thiopental, and secobarbital -benztropine -drugs for bladder problems like solifenacin, trospium, oxybutynin, tolterodine, hyoscyamine, and methscopolamine -drugs for breathing problems like ipratropium and tiotropium -drugs for certain stomach or intestine problems like propantheline, homatropine methylbromide, glycopyrrolate, atropine, belladonna, and dicyclomine -general anesthetics like etomidate, ketamine, nitrous oxide, propofol, desflurane, enflurane, halothane, isoflurane, and sevoflurane -medicines for depression, anxiety, or psychotic disturbances -medicines for sleep -muscle relaxants -naltrexone -narcotic medicines (opiates) for pain -phenothiazines like perphenazine, thioridazine, chlorpromazine, mesoridazine, fluphenazine, prochlorperazine, promazine, and trifluoperazine -scopolamine -tramadol -trihexyphenidyl This list may not describe all possible interactions. Give your health care provider a list of all the medicines, herbs, non-prescription drugs, or dietary supplements you use. Also tell them if you smoke, drink alcohol, or use illegal drugs. Some items may interact with your medicine. What should I watch for while using this medicine? Tell your doctor or health  care professional if your pain does not go away, if it gets worse, or if you have new or a different type of pain. You may develop tolerance to the medicine. Tolerance means that you will need a higher dose of the medication for pain relief. Tolerance is normal and  is expected if you take this medicine for a long time. Do not suddenly stop taking your medicine because you may develop a severe reaction. Your body becomes used to the medicine. This does NOT mean you are addicted. Addiction is a behavior related to getting and using a drug for a non-medical reason. If you have pain, you have a medical reason to take pain medicine. Your doctor will tell you how much medicine to take. If your doctor wants you to stop the medicine, the dose will be slowly lowered over time to avoid any side effects. You may get drowsy or dizzy. Do not drive, use machinery, or do anything that needs mental alertness until you know how this medicine affects you. Do not stand or sit up quickly, especially if you are an older patient. This reduces the risk of dizzy or fainting spells. Alcohol may interfere with the effect of this medicine. Avoid alcoholic drinks. There are different types of narcotic medicines (opiates) for pain. If you take more than one type at the same time, you may have more side effects. Give your health care provider a list of all medicines you use. Your doctor will tell you how much medicine to take. Do not take more medicine than directed. Call emergency for help if you have problems breathing. The medicine will cause constipation. Try to have a bowel movement at least every 2 to 3 days. If you do not have a bowel movement for 3 days, call your doctor or health care professional. Do not take Tylenol (acetaminophen) or medicines that have acetaminophen with this medicine. Too much acetaminophen can be very dangerous. Many nonprescription medicines contain acetaminophen. Always read the labels carefully to avoid taking more acetaminophen. What side effects may I notice from receiving this medicine? Side effects that you should report to your doctor or health care professional as soon as possible: -allergic reactions like skin rash, itching or hives, swelling of the face,  lips, or tongue -breathing difficulties, wheezing -confusion -light headedness or fainting spells -severe stomach pain -unusually weak or tired -yellowing of the skin or the whites of the eyes Side effects that usually do not require medical attention (report to your doctor or health care professional if they continue or are bothersome): -dizziness -drowsiness -nausea -vomiting This list may not describe all possible side effects. Call your doctor for medical advice about side effects. You may report side effects to FDA at 1-800-FDA-1088. Where should I keep my medicine? Keep out of the reach of children. This medicine can be abused. Keep your medicine in a safe place to protect it from theft. Do not share this medicine with anyone. Selling or giving away this medicine is dangerous and against the law. This medicine may cause accidental overdose and death if it taken by other adults, children, or pets. Mix any unused medicine with a substance like cat litter or coffee grounds. Then throw the medicine away in a sealed container like a sealed bag or a coffee can with a lid. Do not use the medicine after the expiration date. Store at room temperature between 20 and 25 degrees C (68 and 77 degrees F). NOTE: This sheet is a summary. It  may not cover all possible information. If you have questions about this medicine, talk to your doctor, pharmacist, or health care provider.    2016, Elsevier/[employer] Standard. (2014-03-30 15:18:46)  Metoclopramide tablets What is this medicine? METOCLOPRAMIDE (met oh kloe PRA mide) is used to treat the symptoms of gastroesophageal reflux disease (GERD) like heartburn. It is also used to treat people with slow emptying of the stomach and intestinal tract. This medicine may be used for other purposes; ask your health care provider or pharmacist if you have questions. What should I tell my health care provider before I take this medicine? They need to know if you have  any of these conditions: -breast cancer -depression -diabetes -heart failure -high blood pressure -kidney disease -liver disease -Parkinson's disease or a movement disorder -pheochromocytoma -seizures -stomach obstruction, bleeding, or perforation -an unusual or allergic reaction to metoclopramide, procainamide, sulfites, other medicines, foods, dyes, or preservatives -pregnant or trying to get pregnant -breast-feeding How should I use this medicine? Take this medicine by mouth with a glass of water. Follow the directions on the prescription label. Take this medicine on an empty stomach, about 30 minutes before eating. Take your doses at regular intervals. Do not take your medicine more often than directed. Do not stop taking except on the advice of your doctor or health care professional. A special MedGuide will be given to you by the pharmacist with each prescription and refill. Be sure to read this information carefully each time. Talk to your pediatrician regarding the use of this medicine in children. Special care may be needed. Overdosage: If you think you have taken too much of this medicine contact a poison control center or emergency room at once. NOTE: This medicine is only for you. Do not share this medicine with others. What if I miss a dose? If you miss a dose, take it as soon as you can. If it is almost time for your next dose, take only that dose. Do not take double or extra doses. What may interact with this medicine? -acetaminophen -cyclosporine -digoxin -medicines for blood pressure -medicines for diabetes, including insulin -medicines for hay fever and other allergies -medicines for depression, especially an Monoamine Oxidase Inhibitor (MAOI) -medicines for Parkinson's disease, like levodopa -medicines for sleep or for pain -tetracycline This list may not describe all possible interactions. Give your health care provider a list of all the medicines, herbs,  non-prescription drugs, or dietary supplements you use. Also tell them if you smoke, drink alcohol, or use illegal drugs. Some items may interact with your medicine. What should I watch for while using this medicine? It may take a few weeks for your stomach condition to start to get better. However, do not take this medicine for longer than 12 weeks. The longer you take this medicine, and the more you take it, the greater your chances are of developing serious side effects. If you are an elderly patient, a female patient, or you have diabetes, you may be at an increased risk for side effects from this medicine. Contact your doctor immediately if you start having movements you cannot control such as lip smacking, rapid movements of the tongue, involuntary or uncontrollable movements of the eyes, head, arms and legs, or muscle twitches and spasms. Patients and their families should watch out for worsening depression or thoughts of suicide. Also watch out for any sudden or severe changes in feelings such as feeling anxious, agitated, panicky, irritable, hostile, aggressive, impulsive, severely restless, overly excited and hyperactive,  or not being able to sleep. If this happens, especially at the beginning of treatment or after a change in dose, call your doctor. Do not treat yourself for high fever. Ask your doctor or health care professional for advice. You may get drowsy or dizzy. Do not drive, use machinery, or do anything that needs mental alertness until you know how this drug affects you. Do not stand or sit up quickly, especially if you are an older patient. This reduces the risk of dizzy or fainting spells. Alcohol can make you more drowsy and dizzy. Avoid alcoholic drinks. What side effects may I notice from receiving this medicine? Side effects that you should report to your doctor or health care professional as soon as possible: -allergic reactions like skin rash, itching or hives, swelling of the  face, lips, or tongue -abnormal production of milk in females -breast enlargement in both males and females -change in the way you walk -difficulty moving, speaking or swallowing -drooling, lip smacking, or rapid movements of the tongue -excessive sweating -fever -involuntary or uncontrollable movements of the eyes, head, arms and legs -irregular heartbeat or palpitations -muscle twitches and spasms -unusually weak or tired Side effects that usually do not require medical attention (report to your doctor or health care professional if they continue or are bothersome): -change in sex drive or performance -depressed mood -diarrhea -difficulty sleeping -headache -menstrual changes -restless or nervous This list may not describe all possible side effects. Call your doctor for medical advice about side effects. You may report side effects to FDA at 1-800-FDA-1088. Where should I keep my medicine? Keep out of the reach of children. Store at room temperature between 20 and 25 degrees C (68 and 77 degrees F). Protect from light. Keep container tightly closed. Throw away any unused medicine after the expiration date. NOTE: This sheet is a summary. It may not cover all possible information. If you have questions about this medicine, talk to your doctor, pharmacist, or health care provider.    2016, [employer] Standard. (2011-08-27 13:04:38)  Tamsulosin capsules What is this medicine? TAMSULOSIN (tam SOO loe sin) is used to treat enlargement of the prostate gland in men, a condition called benign prostatic hyperplasia or BPH. It is not for use in women. It works by relaxing muscles in the prostate and bladder neck. This improves urine flow and reduces BPH symptoms. This medicine may be used for other purposes; ask your health care provider or pharmacist if you have questions. What should I tell my health care provider before I take this medicine? They need to know if you have any of the  following conditions: -advanced kidney disease -advanced liver disease -low blood pressure -prostate cancer -an unusual or allergic reaction to tamsulosin, sulfa drugs, other medicines, foods, dyes, or preservatives -pregnant or trying to get pregnant -breast-feeding How should I use this medicine? Take this medicine by mouth about 30 minutes after the same meal every day. Follow the directions on the prescription label. Swallow the capsules whole with a glass of water. Do not crush, chew, or open capsules. Do not take your medicine more often than directed. Do not stop taking your medicine unless your doctor tells you to. Talk to your pediatrician regarding the use of this medicine in children. Special care may be needed. Overdosage: If you think you have taken too much of this medicine contact a poison control center or emergency room at once. NOTE: This medicine is only for you. Do not share this medicine  with others. What if I miss a dose? If you miss a dose, take it as soon as you can. If it is almost time for your next dose, take only that dose. Do not take double or extra doses. If you stop taking your medicine for several days or more, ask your doctor or health care professional what dose you should start back on. What may interact with this medicine? -cimetidine -fluoxetine -ketoconazole -medicines for erectile disfunction like sildenafil, tadalafil, vardenafil -medicines for high blood pressure -other alpha-blockers like alfuzosin, doxazosin, phentolamine, phenoxybenzamine, prazosin, terazosin -warfarin This list may not describe all possible interactions. Give your health care provider a list of all the medicines, herbs, non-prescription drugs, or dietary supplements you use. Also tell them if you smoke, drink alcohol, or use illegal drugs. Some items may interact with your medicine. What should I watch for while using this medicine? Visit your doctor or health care professional  for regular check ups. You will need lab work done before you start this medicine and regularly while you are taking it. Check your blood pressure as directed. Ask your health care professional what your blood pressure should be, and when you should contact him or her. This medicine may make you feel dizzy or lightheaded. This is more likely to happen after the first dose, after an increase in dose, or during hot weather or exercise. Drinking alcohol and taking some medicines can make this worse. Do not drive, use machinery, or do anything that needs mental alertness until you know how this medicine affects you. Do not sit or stand up quickly. If you begin to feel dizzy, sit down until you feel better. These effects can decrease once your body adjusts to the medicine. Contact your doctor or health care professional right away if you have an erection that lasts longer than 4 hours or if it becomes painful. This may be a sign of a serious problem and must be treated right away to prevent permanent damage. If you are thinking of having cataract surgery, tell your eye surgeon that you have taken this medicine. What side effects may I notice from receiving this medicine? Side effects that you should report to your doctor or health care professional as soon as possible: -allergic reactions like skin rash or itching, hives, swelling of the lips, mouth, tongue, or throat -breathing problems -change in vision -feeling faint or lightheaded -irregular heartbeat -prolonged or painful erection -weakness Side effects that usually do not require medical attention (report to your doctor or health care professional if they continue or are bothersome): -back pain -change in sex drive or performance -constipation, nausea or vomiting -cough -drowsy -runny or stuffy nose -trouble sleeping This list may not describe all possible side effects. Call your doctor for medical advice about side effects. You may report side  effects to FDA at 1-800-FDA-1088. Where should I keep my medicine? Keep out of the reach of children. Store at room temperature between 15 and 30 degrees C (59 and 86 degrees F). Throw away any unused medicine after the expiration date. NOTE: This sheet is a summary. It may not cover all possible information. If you have questions about this medicine, talk to your doctor, pharmacist, or health care provider.    2016, [employer]. (2012-04-29 14:11:34)

## 2015-11-07 NOTE — ED Notes (Signed)
Pt c/o left flank pain, she states she seen urologist yesterday and diagnosed with kidney stones.

## 2015-11-08 ENCOUNTER — Encounter (HOSPITAL_COMMUNITY)
Admission: AD | Disposition: A | Discharge status: Home / Self Care | Payer: Self-pay | Source: Ambulatory Visit | Attending: Urology

## 2015-11-08 ENCOUNTER — Ambulatory Visit (HOSPITAL_COMMUNITY): Payer: 59 | Admitting: Anesthesiology

## 2015-11-08 ENCOUNTER — Other Ambulatory Visit: Payer: Self-pay | Admitting: Urology

## 2015-11-08 ENCOUNTER — Encounter (HOSPITAL_COMMUNITY): Payer: Self-pay | Admitting: *Deleted

## 2015-11-08 ENCOUNTER — Ambulatory Visit (HOSPITAL_COMMUNITY)
Admission: AD | Admit: 2015-11-08 | Discharge: 2015-11-08 | Disposition: A | Discharge status: Home / Self Care | Payer: 59 | Source: Ambulatory Visit | Attending: Urology | Admitting: Urology

## 2015-11-08 DIAGNOSIS — E119 Type 2 diabetes mellitus without complications: Secondary | ICD-10-CM | POA: Diagnosis not present

## 2015-11-08 DIAGNOSIS — Z87891 Personal history of nicotine dependence: Secondary | ICD-10-CM | POA: Diagnosis not present

## 2015-11-08 DIAGNOSIS — Z7984 Long term (current) use of oral hypoglycemic drugs: Secondary | ICD-10-CM | POA: Insufficient documentation

## 2015-11-08 DIAGNOSIS — Z79899 Other long term (current) drug therapy: Secondary | ICD-10-CM | POA: Diagnosis not present

## 2015-11-08 DIAGNOSIS — N201 Calculus of ureter: Secondary | ICD-10-CM | POA: Insufficient documentation

## 2015-11-08 DIAGNOSIS — I10 Essential (primary) hypertension: Secondary | ICD-10-CM | POA: Insufficient documentation

## 2015-11-08 HISTORY — PX: CYSTOSCOPY W/ URETERAL STENT PLACEMENT: SHX1429

## 2015-11-08 LAB — BASIC METABOLIC PANEL
ANION GAP: 5 (ref 5–15)
BUN: 18 mg/dL (ref 6–20)
CALCIUM: 8.9 mg/dL (ref 8.9–10.3)
CO2: 27 mmol/L (ref 22–32)
Chloride: 106 mmol/L (ref 101–111)
Creatinine, Ser: 0.63 mg/dL (ref 0.44–1.00)
Glucose, Bld: 95 mg/dL (ref 65–99)
Potassium: 3.5 mmol/L (ref 3.5–5.1)
SODIUM: 138 mmol/L (ref 135–145)

## 2015-11-08 LAB — GLUCOSE, CAPILLARY
GLUCOSE-CAPILLARY: 106 mg/dL — AB (ref 65–99)
Glucose-Capillary: 78 mg/dL (ref 65–99)

## 2015-11-08 SURGERY — CYSTOSCOPY, WITH RETROGRADE PYELOGRAM AND URETERAL STENT INSERTION
Anesthesia: General | Laterality: Left

## 2015-11-08 MED ORDER — ONDANSETRON HCL 4 MG/2ML IJ SOLN
INTRAMUSCULAR | Status: AC
  Filled 2015-11-08: qty 2

## 2015-11-08 MED ORDER — CEFAZOLIN SODIUM-DEXTROSE 2-4 GM/100ML-% IV SOLN
INTRAVENOUS | Status: AC
  Filled 2015-11-08: qty 100

## 2015-11-08 MED ORDER — FENTANYL CITRATE (PF) 100 MCG/2ML IJ SOLN: 25.0000 ug | INTRAMUSCULAR | Status: DC | PRN

## 2015-11-08 MED ORDER — PROPOFOL 10 MG/ML IV BOLUS
INTRAVENOUS | Status: DC | PRN
  Administered 2015-11-08: 200 mg via INTRAVENOUS

## 2015-11-08 MED ORDER — PROPOFOL 10 MG/ML IV BOLUS
INTRAVENOUS | Status: AC
  Filled 2015-11-08: qty 40

## 2015-11-08 MED ORDER — LACTATED RINGERS IV SOLN: INTRAVENOUS | Status: DC

## 2015-11-08 MED ORDER — LACTATED RINGERS IV SOLN
INTRAVENOUS | Status: DC | PRN
  Administered 2015-11-08: 20:00:00 via INTRAVENOUS

## 2015-11-08 MED ORDER — FENTANYL CITRATE (PF) 100 MCG/2ML IJ SOLN
INTRAMUSCULAR | Status: AC
  Filled 2015-11-08: qty 2

## 2015-11-08 MED ORDER — ONDANSETRON HCL 4 MG/2ML IJ SOLN
INTRAMUSCULAR | Status: AC
Start: 2015-11-08 — End: 2015-11-08
  Filled 2015-11-08: qty 2

## 2015-11-08 MED ORDER — FENTANYL CITRATE (PF) 100 MCG/2ML IJ SOLN
INTRAMUSCULAR | Status: DC | PRN
  Administered 2015-11-08 (×2): 50 ug via INTRAVENOUS

## 2015-11-08 MED ORDER — MIDAZOLAM HCL 5 MG/5ML IJ SOLN
INTRAMUSCULAR | Status: DC | PRN
  Administered 2015-11-08: 2 mg via INTRAVENOUS

## 2015-11-08 MED ORDER — LIDOCAINE HCL (CARDIAC) 20 MG/ML IV SOLN
INTRAVENOUS | Status: DC | PRN
  Administered 2015-11-08: 50 mg via INTRAVENOUS

## 2015-11-08 MED ORDER — ONDANSETRON HCL 4 MG/2ML IJ SOLN
INTRAMUSCULAR | Status: DC | PRN
  Administered 2015-11-08: 4 mg via INTRAVENOUS

## 2015-11-08 MED ORDER — MIDAZOLAM HCL 2 MG/2ML IJ SOLN
INTRAMUSCULAR | Status: AC
  Filled 2015-11-08: qty 2

## 2015-11-08 MED ORDER — SODIUM CHLORIDE 0.9 % IR SOLN
Status: DC | PRN
  Administered 2015-11-08: 4000 mL

## 2015-11-08 MED ORDER — CEFAZOLIN SODIUM-DEXTROSE 2-4 GM/100ML-% IV SOLN
2.0000 g | INTRAVENOUS | Status: AC
  Administered 2015-11-08: 2 g via INTRAVENOUS

## 2015-11-08 SURGICAL SUPPLY — 11 items
BAG URO CATCHER STRL LF (MISCELLANEOUS) ×2 IMPLANT
BASKET ZERO TIP NITINOL 2.4FR (BASKET) ×1 IMPLANT
BSKT STON RTRVL ZERO TP 2.4FR (BASKET) ×1
CATH INTERMIT  6FR 70CM (CATHETERS) ×2 IMPLANT
CLOTH BEACON ORANGE TIMEOUT ST (SAFETY) ×2 IMPLANT
GLOVE BIOGEL M STRL SZ7.5 (GLOVE) ×4 IMPLANT
GOWN STRL REUS W/TWL LRG LVL3 (GOWN DISPOSABLE) ×4 IMPLANT
GUIDEWIRE STR DUAL SENSOR (WIRE) ×2 IMPLANT
MANIFOLD NEPTUNE II (INSTRUMENTS) ×2 IMPLANT
PACK CYSTO (CUSTOM PROCEDURE TRAY) ×2 IMPLANT
TUBING CONNECTING 10 (TUBING) ×2 IMPLANT

## 2015-11-08 NOTE — H&P (Signed)
Office Visit Report     11/08/2015   --------------------------------------------------------------------------------   Kathy Tran  MRN: [id number]  PRIMARY CARE:  Paula Compton. Claudia Pollock, MD  DOB: Apr 10, 1956, 60 year old Female  REFERRING:    SSN: [id number]  PROVIDER:  Jimmey Ralph    LOCATION:  [facility] [phone]   --------------------------------------------------------------------------------   CC/HPI: Seen today for an ER f/u. Was seen at [facility] ER 11/07/15. Seen earlier same day by Dr. Jeffie Pollock but no obvious distal ureteral stone noted on KUB. She later developed worsening flank pain and went to ER CT urogram showed a distal 3 mm left UVJ calculus.    Today states she can not keep pain controlled unless she takes Percocet around the clock. Has been trying to pass stone now for 10+ days. "I just can't do this any longer." Did pass a stone ~ 2-3 weeks ago. Has now had >300 in her lifetime.     ALLERGIES: Demerol TABS - Other Reaction, confusion Sulfa Drugs - Trouble Breathing, Swelling    MEDICATIONS: Percocet  Benadryl Allergy TABS Oral  Carvedilol 25 MG Oral Tablet Oral  Glimepiride 2 MG Oral Tablet Oral  HydroCHLOROthiazide 25 MG Oral Tablet 0 Oral Daily  MetFORMIN HCl - 500 MG Oral Tablet 0 Oral  Metrogel 1 % External Gel External  Pravastatin Sodium 10 MG Oral Tablet Oral  TraMADol HCl - 50 MG Oral Tablet 0 Oral  Victoza 2-Pak 0.6 mg/0.1 ml (18 mg/3 ml) pen injector     GU PSH: Cysto Uretero Remove Stone - 07/07/2015, 2010 Cystoscopy Insert Stent - 2010      PSH Notes: Cystoscopy With Ureteroscopy With Removal Of Calculus, Cystoscopy With Ureteroscopy With Removal Of Calculus, Cystoscopy With Insertion Of Ureteral Stent Right, Gynecologic Surgery, Cesarean Section, Tubal Ligation   NON-GU PSH: Cesarean Delivery Only - 2010 Genital Surgery Procedure - 2010 Tubal Ligation - 2010    GU PMH: Calculus Ureter (Worsening), I don't see an obvious stone today.. -  11/06/2015, Calculus Ureter, Calculus of ureter - 07/10/2015 Kidney Stone, Nephrolithiasis - [id number] Renal Colic, Renal colic - 123456 Dysuria, Dysuria - 06/27/2015 Urgency of urination, Urinary urgency - 06/27/2015 Acute Cystitis, Acute cystitis without hematuria - 06/13/2015 Urinary Tract Inf, Unspec site, Urinary tract infection - 10/06/2014 Abdominal Pain Unspec, Rt flank pain - 10/03/2014 Low back pain, Lower back pain - 2014    NON-GU PMH: Encounter for general adult medical examination without abnormal findings, Encounter for preventive health examination - 2015, Normal routine physical examination, - 2014 Personal history of other diseases of the circulatory system, History of hypertension - 2014 Personal history of other endocrine, nutritional and metabolic disease, History of diabetes mellitus - 2014 Personal history of other specified conditions, History of heartburn - 2014    FAMILY HISTORY: Hypertension - Runs In Family nephrolithiasis - Grandfather Urinary Calculus - Runs In Family   SOCIAL HISTORY: Marital Status: Married     Notes: Former smoker   REVIEW OF SYSTEMS:    GU Review Female:   Patient reports get up at night to urinate, have to strain to urinate, and frequent urination. Patient denies trouble starting your stream, currently pregnant, stream starts and stops, burning /pain with urination, hard to postpone urination, and leakage of urine.  Gastrointestinal (Upper):   Patient reports nausea. Patient denies vomiting and indigestion/ heartburn.  Gastrointestinal (Lower):   Patient denies diarrhea and constipation.  Constitutional:   Patient denies fever, night sweats, weight loss, and fatigue.  Skin:   Patient denies skin rash/ lesion and itching.  Eyes:   Patient denies blurred vision and double vision.  Ears/ Nose/ Throat:   Patient denies sore throat and sinus problems.  Hematologic/Lymphatic:   Patient denies swollen glands and easy bruising.   Cardiovascular:   Patient denies leg swelling and chest pains.  Respiratory:   Patient denies cough and shortness of breath.  Endocrine:   Patient reports excessive thirst.   Musculoskeletal:   Patient denies back pain and joint pain.  Neurological:   Patient denies headaches and dizziness.  Psychologic:   Patient denies depression and anxiety.   VITAL SIGNS: None   GU PHYSICAL EXAMINATION:    Bladder: Bladder tender. Normal to palpation, no mass, normal size.    MULTI-SYSTEM PHYSICAL EXAMINATION:    Constitutional: Well-nourished. No physical deformities. Normally developed. Good grooming.   Respiratory: No labored breathing, no use of accessory muscles.   Cardiovascular: Normal temperature, normal extremity pulses, no swelling, no varicosities.   Neurologic / Psychiatric: Oriented to time, oriented to place, oriented to person. No depression, no anxiety, no agitation.   Gastrointestinal: No mass, no tenderness, no rigidity, non obese abdomen.      PAST DATA REVIEWED:  Source Of History:  Patient  Records Review:   Previous Hospital Records, Previous Patient Records  Urine Test Review:   Urinalysis  X-Ray Review: KUB: Reviewed Films.  C.T. Stone Protocol: Reviewed Films. 11/07/15. Shows tiny left UVJ calculus. This stone was not noted on KUB earlier same day.     PROCEDURES:         KUB - 74000  A single view of the abdomen is obtained.      It appears that there is a small calculus overlying bladder/left UVJ noted today.         Urinalysis - 81003 Dipstick Dipstick Cont'd  Specimen: Voided Bilirubin: Neg  Color: Green Ketones: Neg  Appearance: Clear Blood: Neg  Specific Gravity: 1.020 Protein: Neg  pH: 6.0 Urobilinogen: 0.2  Glucose: Neg Nitrites: Neg    Leukocyte Esterase: Neg         Ketoralac 60mg  - [id number], [id number] Supervised by Jimmey Ralph   Qty: 60 Adm. By: Alcide Goodness  Unit: mg Lot No [id number]  Route: IM Exp. Date 02/19/2017  Freq: None Mfgr.:   Site:  Right Hip   ASSESSMENT:      ICD-10 Details  1 GU:   Calculus Kidney and Ureter - N20.2    PLAN:           Orders X-Rays: KUB          Schedule Procedure: 11/08/2015 at [facility] - [phone] - Ketoralac 60mg  (Toradol Per 15 Mg) - [id number], [id number]          Document Letter(s):  Created for Patient: Clinical Summary         Notes:   Ketorolac 60 mg IM today  Discussed proceeding with urgent cystourethroscopy, (L) RPG, stone extraction, and possible double J stent placement by Dr. Alinda Money (on-call) this pm. Risks and benefits discussed of general anesthesia, infection, hematuria, and stent placement dicussed with pt and she voices understanding and wishes to proceed    * Signed by Jimmey Ralph on 11/08/15 at 11:42 AM (EDT)*

## 2015-11-08 NOTE — Anesthesia Preprocedure Evaluation (Signed)
Anesthesia Evaluation  Patient identified by MRN, date of birth, ID band Patient awake    Reviewed: Allergy & Precautions, NPO status , Patient's Chart, lab work & pertinent test results  History of Anesthesia Complications Negative for: history of anesthetic complications  Airway Mallampati: III  TM Distance: >3 FB Neck ROM: Full    Dental no notable dental hx. (+) Teeth Intact, Dental Advisory Given   Pulmonary neg pulmonary ROS,    Pulmonary exam normal breath sounds clear to auscultation       Cardiovascular hypertension, Pt. on medications and Pt. on home beta blockers (-) angina(-) CABG and (-) CHF Normal cardiovascular exam Rhythm:Regular     Neuro/Psych negative neurological ROS  negative psych ROS   GI/Hepatic negative GI ROS, Neg liver ROS,   Endo/Other  diabetes, Well Controlled, Type 2, Oral Hypoglycemic Agents  Renal/GU      Musculoskeletal   Abdominal   Peds  Hematology   Anesthesia Other Findings   Reproductive/Obstetrics                             Anesthesia Physical Anesthesia Plan  ASA: III  Anesthesia Plan: General   Post-op Pain Management:    Induction: Intravenous  Airway Management Planned: LMA  Additional Equipment:   Intra-op Plan:   Post-operative Plan:   Informed Consent:   Plan Discussed with:   Anesthesia Plan Comments:         Anesthesia Quick Evaluation

## 2015-11-08 NOTE — Op Note (Signed)
Preoperative diagnosis: Left distal ureteral calculus  Postoperative diagnosis: Left distl ureteral calculus  Procedure:  1. Cystoscopy 2. Left ureteroscopy and stone removal   Surgeon: Pryor Curia. M.D.  Anesthesia: General  Complications: None  EBL: Minimal  Specimens: 1. Left ureteral calculus  Disposition of specimens: [facility] for stone analysis  Indication: Kathy Tran is a 60 y.o. year old patient with urolithiasis. She has a distal left ureteral stone with poorly controlled pain. After reviewing the management options for treatment, the patient elected to proceed with the above surgical procedure(s). We have discussed the potential benefits and risks of the procedure, side effects of the proposed treatment, the likelihood of the patient achieving the goals of the procedure, and any potential problems that might occur during the procedure or recuperation. Informed consent has been obtained.  Description of procedure:  The patient was taken to the operating room and general anesthesia was induced.  The patient was placed in the dorsal lithotomy position, prepped and draped in the usual sterile fashion, and preoperative antibiotics were administered. A preoperative time-out was performed.   Cystourethroscopy was performed.  The patient's urethra was examined and was normal. The bladder was then systematically examined in its entirety. There was no evidence for any bladder tumors, stones, or other mucosal pathology.    Attention then turned to the left ureteral orifice and a 0.38 sensor guidewire was then advanced up the left ureter into the renal pelvis under fluoroscopic guidance. The 6 Fr semirigid ureteroscope was then advanced into the ureter next to the guidewire and the calculus was identified.  The stone was then removed from the ureter with a zero tip nitinol basket.  Reinspection of the ureter revealed no remaining visible stones or  fragments.   It was felt that no stent would be needed.  The bladder was then emptied and the procedure ended.  The patient appeared to tolerate the procedure well and without complications.  The patient was able to be awakened and transferred to the recovery unit in satisfactory condition.

## 2015-11-08 NOTE — Anesthesia Postprocedure Evaluation (Signed)
Anesthesia Post Note  Patient: Kathy Tran  Procedure(s) Performed: Procedure(s) (LRB): CYSTOSCOPY, URETEROSCOPY, WITH STONE BASKET EXTRACTION (Left)  Patient location during evaluation: PACU Anesthesia Type: General Level of consciousness: awake and alert Pain management: pain level controlled Vital Signs Assessment: post-procedure vital signs reviewed and stable Respiratory status: spontaneous breathing, nonlabored ventilation, respiratory function stable and patient connected to nasal cannula oxygen Cardiovascular status: blood pressure returned to baseline and stable Postop Assessment: no signs of nausea or vomiting Anesthetic complications: no    Last Vitals:  Filed Vitals:   11/08/15 1732 11/08/15 2126  BP: 132/84 120/76  Pulse: 79 75  Temp: 36.5 C 36.6 C  Resp: 16 16    Last Pain:  Filed Vitals:   11/08/15 2154  PainSc: 0-No pain                 Everlynn Sagun L

## 2015-11-08 NOTE — Discharge Instructions (Signed)
1. You may see some blood in the urine and may have some burning with urination for 48-72 hours. You also may notice that you have to urinate more frequently or urgently after your procedure which is normal.  °2. You should call should you develop an inability urinate, fever > 101, persistent nausea and vomiting that prevents you from eating or drinking to stay hydrated.  °

## 2015-11-08 NOTE — Transfer of Care (Signed)
Immediate Anesthesia Transfer of Care Note  Patient: Kathy Tran  Procedure(s) Performed: Procedure(s): CYSTOSCOPY, URETEROSCOPY, WITH STONE BASKET EXTRACTION (Left)  Patient Location: PACU  Anesthesia Type:General  Level of Consciousness:  sedated, patient cooperative and responds to stimulation  Airway & Oxygen Therapy:Patient Spontanous Breathing and Patient connected to face mask oxgen  Post-op Assessment:  Report given to PACU RN and Post -op Vital signs reviewed and stable  Post vital signs:  Reviewed and stable  Last Vitals:  Filed Vitals:   11/08/15 1732  BP: 132/84  Pulse: 79  Temp: 36.5 C  Resp: 16    Complications: No apparent anesthesia complications

## 2015-11-09 ENCOUNTER — Encounter (HOSPITAL_COMMUNITY): Payer: Self-pay | Admitting: Urology

## 2015-11-09 DIAGNOSIS — N201 Calculus of ureter: Secondary | ICD-10-CM | POA: Diagnosis not present

## 2015-12-25 ENCOUNTER — Other Ambulatory Visit (HOSPITAL_COMMUNITY): Payer: Self-pay | Admitting: Internal Medicine

## 2015-12-25 DIAGNOSIS — Z1231 Encounter for screening mammogram for malignant neoplasm of breast: Secondary | ICD-10-CM

## 2016-01-01 ENCOUNTER — Ambulatory Visit (HOSPITAL_COMMUNITY)
Admission: RE | Admit: 2016-01-01 | Discharge: 2016-01-01 | Disposition: A | Discharge status: Home / Self Care | Payer: 59 | Source: Ambulatory Visit | Attending: Internal Medicine | Admitting: Internal Medicine

## 2016-01-01 DIAGNOSIS — Z1231 Encounter for screening mammogram for malignant neoplasm of breast: Secondary | ICD-10-CM | POA: Diagnosis present

## 2016-04-10 ENCOUNTER — Encounter (INDEPENDENT_AMBULATORY_CARE_PROVIDER_SITE_OTHER): Payer: Self-pay | Admitting: *Deleted

## 2016-05-28 DIAGNOSIS — E2749 Other adrenocortical insufficiency: Secondary | ICD-10-CM | POA: Diagnosis not present

## 2016-05-28 DIAGNOSIS — E038 Other specified hypothyroidism: Secondary | ICD-10-CM | POA: Diagnosis not present

## 2016-05-28 DIAGNOSIS — M47817 Spondylosis without myelopathy or radiculopathy, lumbosacral region: Secondary | ICD-10-CM | POA: Diagnosis not present

## 2016-05-28 DIAGNOSIS — G894 Chronic pain syndrome: Secondary | ICD-10-CM | POA: Diagnosis not present

## 2016-05-28 DIAGNOSIS — M545 Low back pain: Secondary | ICD-10-CM | POA: Diagnosis not present

## 2016-06-04 DIAGNOSIS — E559 Vitamin D deficiency, unspecified: Secondary | ICD-10-CM | POA: Diagnosis not present

## 2016-06-04 DIAGNOSIS — N951 Menopausal and female climacteric states: Secondary | ICD-10-CM | POA: Diagnosis not present

## 2016-06-04 DIAGNOSIS — E539 Vitamin B deficiency, unspecified: Secondary | ICD-10-CM | POA: Diagnosis not present

## 2016-06-12 ENCOUNTER — Other Ambulatory Visit (INDEPENDENT_AMBULATORY_CARE_PROVIDER_SITE_OTHER): Payer: Self-pay | Admitting: *Deleted

## 2016-06-12 DIAGNOSIS — Z86010 Personal history of colonic polyps: Secondary | ICD-10-CM

## 2016-07-02 DIAGNOSIS — E119 Type 2 diabetes mellitus without complications: Secondary | ICD-10-CM | POA: Diagnosis not present

## 2016-07-09 DIAGNOSIS — E119 Type 2 diabetes mellitus without complications: Secondary | ICD-10-CM | POA: Diagnosis not present

## 2016-07-09 DIAGNOSIS — I10 Essential (primary) hypertension: Secondary | ICD-10-CM | POA: Diagnosis not present

## 2016-07-09 DIAGNOSIS — R05 Cough: Secondary | ICD-10-CM | POA: Diagnosis not present

## 2016-07-17 DIAGNOSIS — R05 Cough: Secondary | ICD-10-CM | POA: Diagnosis not present

## 2016-07-17 DIAGNOSIS — J3481 Nasal mucositis (ulcerative): Secondary | ICD-10-CM | POA: Diagnosis not present

## 2016-07-17 DIAGNOSIS — J45909 Unspecified asthma, uncomplicated: Secondary | ICD-10-CM | POA: Diagnosis not present

## 2016-07-18 ENCOUNTER — Encounter (HOSPITAL_COMMUNITY): Payer: Self-pay | Admitting: Emergency Medicine

## 2016-07-18 ENCOUNTER — Emergency Department (HOSPITAL_COMMUNITY): Payer: 59

## 2016-07-18 ENCOUNTER — Inpatient Hospital Stay (HOSPITAL_COMMUNITY)
Admission: EM | Admit: 2016-07-18 | Discharge: 2016-07-23 | DRG: 203 | Disposition: A | Discharge status: Home / Self Care | Payer: 59 | Attending: Internal Medicine | Admitting: Internal Medicine

## 2016-07-18 DIAGNOSIS — Z794 Long term (current) use of insulin: Secondary | ICD-10-CM

## 2016-07-18 DIAGNOSIS — E785 Hyperlipidemia, unspecified: Secondary | ICD-10-CM | POA: Diagnosis not present

## 2016-07-18 DIAGNOSIS — E782 Mixed hyperlipidemia: Secondary | ICD-10-CM | POA: Diagnosis not present

## 2016-07-18 DIAGNOSIS — R062 Wheezing: Secondary | ICD-10-CM | POA: Diagnosis not present

## 2016-07-18 DIAGNOSIS — I10 Essential (primary) hypertension: Secondary | ICD-10-CM | POA: Diagnosis not present

## 2016-07-18 DIAGNOSIS — K219 Gastro-esophageal reflux disease without esophagitis: Secondary | ICD-10-CM

## 2016-07-18 DIAGNOSIS — J45909 Unspecified asthma, uncomplicated: Secondary | ICD-10-CM | POA: Diagnosis present

## 2016-07-18 DIAGNOSIS — Z87442 Personal history of urinary calculi: Secondary | ICD-10-CM

## 2016-07-18 DIAGNOSIS — Z825 Family history of asthma and other chronic lower respiratory diseases: Secondary | ICD-10-CM

## 2016-07-18 DIAGNOSIS — E1165 Type 2 diabetes mellitus with hyperglycemia: Secondary | ICD-10-CM | POA: Diagnosis present

## 2016-07-18 DIAGNOSIS — Z7982 Long term (current) use of aspirin: Secondary | ICD-10-CM | POA: Diagnosis not present

## 2016-07-18 DIAGNOSIS — E1169 Type 2 diabetes mellitus with other specified complication: Secondary | ICD-10-CM

## 2016-07-18 DIAGNOSIS — Z7984 Long term (current) use of oral hypoglycemic drugs: Secondary | ICD-10-CM | POA: Diagnosis not present

## 2016-07-18 DIAGNOSIS — E1159 Type 2 diabetes mellitus with other circulatory complications: Secondary | ICD-10-CM

## 2016-07-18 DIAGNOSIS — J45901 Unspecified asthma with (acute) exacerbation: Secondary | ICD-10-CM | POA: Diagnosis not present

## 2016-07-18 DIAGNOSIS — R059 Cough, unspecified: Secondary | ICD-10-CM

## 2016-07-18 DIAGNOSIS — E119 Type 2 diabetes mellitus without complications: Secondary | ICD-10-CM | POA: Diagnosis not present

## 2016-07-18 DIAGNOSIS — R0602 Shortness of breath: Secondary | ICD-10-CM

## 2016-07-18 DIAGNOSIS — R05 Cough: Secondary | ICD-10-CM

## 2016-07-18 DIAGNOSIS — J209 Acute bronchitis, unspecified: Secondary | ICD-10-CM | POA: Diagnosis not present

## 2016-07-18 LAB — COMPREHENSIVE METABOLIC PANEL
ALBUMIN: 3.9 g/dL (ref 3.5–5.0)
ALT: 26 U/L (ref 14–54)
AST: 25 U/L (ref 15–41)
Alkaline Phosphatase: 37 U/L — ABNORMAL LOW (ref 38–126)
Anion gap: 9 (ref 5–15)
BUN: 16 mg/dL (ref 6–20)
CHLORIDE: 104 mmol/L (ref 101–111)
CO2: 25 mmol/L (ref 22–32)
Calcium: 9 mg/dL (ref 8.9–10.3)
Creatinine, Ser: 0.66 mg/dL (ref 0.44–1.00)
GFR calc Af Amer: >60 mL/min (ref >60)
GLUCOSE: 322 mg/dL — AB (ref 65–99)
POTASSIUM: 3.7 mmol/L (ref 3.5–5.1)
SODIUM: 138 mmol/L (ref 135–145)
Total Bilirubin: 0.5 mg/dL (ref 0.3–1.2)
Total Protein: 6.4 g/dL — ABNORMAL LOW (ref 6.5–8.1)

## 2016-07-18 LAB — CBC WITH DIFFERENTIAL/PLATELET
BASOS ABS: 0 10*3/uL (ref 0.0–0.1)
Basophils Relative: 0 %
EOS ABS: 0 10*3/uL (ref 0.0–0.7)
EOS PCT: 0 %
HCT: 39.6 % (ref 36.0–46.0)
Hemoglobin: 13 g/dL (ref 12.0–15.0)
Lymphocytes Relative: 10 %
Lymphs Abs: 0.6 10*3/uL — ABNORMAL LOW (ref 0.7–4.0)
MCH: 31.8 pg (ref 26.0–34.0)
MCHC: 32.8 g/dL (ref 30.0–36.0)
MCV: 96.8 fL (ref 78.0–100.0)
Monocytes Absolute: 0.2 10*3/uL (ref 0.1–1.0)
Monocytes Relative: 4 %
Neutro Abs: 5.7 10*3/uL (ref 1.7–7.7)
Neutrophils Relative %: 86 %
PLATELETS: 207 10*3/uL (ref 150–400)
RBC: 4.09 MIL/uL (ref 3.87–5.11)
RDW: 12.7 % (ref 11.5–15.5)
WBC: 6.6 10*3/uL (ref 4.0–10.5)

## 2016-07-18 LAB — TSH: TSH: 0.823 u[IU]/mL (ref 0.350–4.500)

## 2016-07-18 LAB — GLUCOSE, CAPILLARY: Glucose-Capillary: 457 mg/dL — ABNORMAL HIGH (ref 65–99)

## 2016-07-18 LAB — TROPONIN I

## 2016-07-18 MED ORDER — IPRATROPIUM-ALBUTEROL 0.5-2.5 (3) MG/3ML IN SOLN
3.0000 mL | Freq: Once | RESPIRATORY_TRACT | Status: AC
  Administered 2016-07-18: 3 mL via RESPIRATORY_TRACT
  Filled 2016-07-18: qty 3

## 2016-07-18 MED ORDER — METFORMIN HCL ER 500 MG PO TB24
1000.0000 mg | ORAL_TABLET | Freq: Two times a day (BID) | ORAL | Status: DC
  Administered 2016-07-19 – 2016-07-23 (×9): 1000 mg via ORAL
  Filled 2016-07-18 (×9): qty 2

## 2016-07-18 MED ORDER — ALBUTEROL (5 MG/ML) CONTINUOUS INHALATION SOLN
10.0000 mg/h | INHALATION_SOLUTION | RESPIRATORY_TRACT | Status: DC
  Administered 2016-07-18: 10 mg/h via RESPIRATORY_TRACT
  Filled 2016-07-18: qty 20

## 2016-07-18 MED ORDER — SODIUM CHLORIDE 0.9 % IV SOLN
INTRAVENOUS | Status: DC
  Administered 2016-07-19 – 2016-07-22 (×5): via INTRAVENOUS

## 2016-07-18 MED ORDER — METHYLPREDNISOLONE SODIUM SUCC 40 MG IJ SOLR
40.0000 mg | Freq: Four times a day (QID) | INTRAMUSCULAR | Status: DC
Start: 2016-07-18 — End: 2016-07-19
  Administered 2016-07-18 – 2016-07-19 (×3): 40 mg via INTRAVENOUS
  Filled 2016-07-18 (×3): qty 1

## 2016-07-18 MED ORDER — METOCLOPRAMIDE HCL 10 MG PO TABS: 10.0000 mg | ORAL_TABLET | Freq: Four times a day (QID) | ORAL | Status: DC | PRN

## 2016-07-18 MED ORDER — METHYLPREDNISOLONE SODIUM SUCC 125 MG IJ SOLR
125.0000 mg | Freq: Once | INTRAMUSCULAR | Status: AC
  Administered 2016-07-18: 125 mg via INTRAVENOUS
  Filled 2016-07-18: qty 2

## 2016-07-18 MED ORDER — LIRAGLUTIDE 18 MG/3ML ~~LOC~~ SOPN
1.2000 mg | PEN_INJECTOR | Freq: Every day | SUBCUTANEOUS | Status: DC
  Administered 2016-07-21: 1.2 mg via SUBCUTANEOUS
  Filled 2016-07-18: qty 3

## 2016-07-18 MED ORDER — TAMSULOSIN HCL 0.4 MG PO CAPS
0.4000 mg | ORAL_CAPSULE | Freq: Every day | ORAL | Status: DC
  Administered 2016-07-20 – 2016-07-21 (×2): 0.4 mg via ORAL
  Filled 2016-07-18 (×3): qty 1

## 2016-07-18 MED ORDER — ALBUTEROL SULFATE (2.5 MG/3ML) 0.083% IN NEBU
2.5000 mg | INHALATION_SOLUTION | RESPIRATORY_TRACT | Status: DC | PRN
  Administered 2016-07-19 – 2016-07-20 (×2): 2.5 mg via RESPIRATORY_TRACT
  Filled 2016-07-18 (×3): qty 3

## 2016-07-18 MED ORDER — CARVEDILOL 12.5 MG PO TABS
25.0000 mg | ORAL_TABLET | Freq: Two times a day (BID) | ORAL | Status: DC
Start: 2016-07-18 — End: 2016-07-23
  Administered 2016-07-19 – 2016-07-23 (×9): 25 mg via ORAL
  Filled 2016-07-18 (×3): qty 2
  Filled 2016-07-18: qty 8
  Filled 2016-07-18 (×5): qty 2

## 2016-07-18 MED ORDER — ENOXAPARIN SODIUM 40 MG/0.4ML ~~LOC~~ SOLN
40.0000 mg | SUBCUTANEOUS | Status: DC
  Administered 2016-07-18 – 2016-07-22 (×4): 40 mg via SUBCUTANEOUS
  Filled 2016-07-18 (×4): qty 0.4

## 2016-07-18 MED ORDER — DEXTROSE 5 % IV SOLN
INTRAVENOUS | Status: AC
  Filled 2016-07-18: qty 500

## 2016-07-18 MED ORDER — DEXTROSE 5 % IV SOLN
500.0000 mg | INTRAVENOUS | Status: DC
  Administered 2016-07-18 – 2016-07-22 (×5): 500 mg via INTRAVENOUS
  Filled 2016-07-18 (×6): qty 500

## 2016-07-18 MED ORDER — ALBUTEROL SULFATE (2.5 MG/3ML) 0.083% IN NEBU
2.5000 mg | INHALATION_SOLUTION | Freq: Three times a day (TID) | RESPIRATORY_TRACT | Status: DC
  Administered 2016-07-19 – 2016-07-20 (×4): 2.5 mg via RESPIRATORY_TRACT
  Filled 2016-07-18 (×5): qty 3

## 2016-07-18 MED ORDER — ASPIRIN EC 81 MG PO TBEC
81.0000 mg | DELAYED_RELEASE_TABLET | ORAL | Status: DC
  Administered 2016-07-19 – 2016-07-22 (×2): 81 mg via ORAL
  Filled 2016-07-18 (×5): qty 1

## 2016-07-18 MED ORDER — PRAVASTATIN SODIUM 10 MG PO TABS
10.0000 mg | ORAL_TABLET | Freq: Every evening | ORAL | Status: DC
  Administered 2016-07-19 – 2016-07-22 (×4): 10 mg via ORAL
  Filled 2016-07-18 (×5): qty 1

## 2016-07-18 MED ORDER — GLIMEPIRIDE 2 MG PO TABS
2.0000 mg | ORAL_TABLET | Freq: Every day | ORAL | Status: DC
  Administered 2016-07-19 – 2016-07-23 (×5): 2 mg via ORAL
  Filled 2016-07-18 (×5): qty 1

## 2016-07-18 MED ORDER — INSULIN ASPART 100 UNIT/ML ~~LOC~~ SOLN
0.0000 [IU] | Freq: Every day | SUBCUTANEOUS | Status: DC
  Administered 2016-07-18: 5 [IU] via SUBCUTANEOUS
  Administered 2016-07-20 – 2016-07-21 (×2): 2 [IU] via SUBCUTANEOUS
  Administered 2016-07-22: 3 [IU] via SUBCUTANEOUS

## 2016-07-18 MED ORDER — PANTOPRAZOLE SODIUM 40 MG PO TBEC
40.0000 mg | DELAYED_RELEASE_TABLET | Freq: Two times a day (BID) | ORAL | Status: DC
  Administered 2016-07-18 – 2016-07-23 (×10): 40 mg via ORAL
  Filled 2016-07-18 (×10): qty 1

## 2016-07-18 MED ORDER — ALBUTEROL SULFATE (2.5 MG/3ML) 0.083% IN NEBU
2.5000 mg | INHALATION_SOLUTION | Freq: Four times a day (QID) | RESPIRATORY_TRACT | Status: DC
  Administered 2016-07-18: 2.5 mg via RESPIRATORY_TRACT
  Filled 2016-07-18: qty 3

## 2016-07-18 MED ORDER — ONDANSETRON HCL 4 MG PO TABS: 4.0000 mg | ORAL_TABLET | Freq: Four times a day (QID) | ORAL | Status: DC | PRN

## 2016-07-18 MED ORDER — ONDANSETRON HCL 4 MG/2ML IJ SOLN: 4.0000 mg | Freq: Four times a day (QID) | INTRAMUSCULAR | Status: DC | PRN

## 2016-07-18 MED ORDER — LIOTHYRONINE SODIUM 5 MCG PO TABS
5.0000 ug | ORAL_TABLET | Freq: Every day | ORAL | Status: DC
  Administered 2016-07-19 – 2016-07-22 (×4): 5 ug via ORAL
  Filled 2016-07-18 (×6): qty 1

## 2016-07-18 MED ORDER — OXYCODONE-ACETAMINOPHEN 5-325 MG PO TABS: 1.0000 | ORAL_TABLET | ORAL | Status: DC | PRN

## 2016-07-18 MED ORDER — INSULIN ASPART 100 UNIT/ML ~~LOC~~ SOLN
0.0000 [IU] | Freq: Three times a day (TID) | SUBCUTANEOUS | Status: DC
  Administered 2016-07-19: 11 [IU] via SUBCUTANEOUS
  Administered 2016-07-19: 20 [IU] via SUBCUTANEOUS
  Administered 2016-07-19: 15 [IU] via SUBCUTANEOUS
  Administered 2016-07-20: 7 [IU] via SUBCUTANEOUS
  Administered 2016-07-20: 15 [IU] via SUBCUTANEOUS
  Administered 2016-07-20: 11 [IU] via SUBCUTANEOUS
  Administered 2016-07-21: 7 [IU] via SUBCUTANEOUS
  Administered 2016-07-21: 20 [IU] via SUBCUTANEOUS
  Administered 2016-07-21 – 2016-07-22 (×2): 11 [IU] via SUBCUTANEOUS
  Administered 2016-07-22: 15 [IU] via SUBCUTANEOUS
  Administered 2016-07-22: 7 [IU] via SUBCUTANEOUS
  Administered 2016-07-23: 11 [IU] via SUBCUTANEOUS

## 2016-07-18 NOTE — ED Notes (Signed)
Dr Le at bedside.  

## 2016-07-18 NOTE — H&P (Signed)
History and Physical    Kathy Tran TIW:[id number] DOB: 28-Apr-1956 DOA: 07/18/2016  PCP: Asencion Noble, MD  Patient coming from: Home.    Chief Complaint:   Persitent wheezing and SOB.   HPI: Kathy Tran is an 61 y.o. female with hx of DM, HTN, nephrolithiasis, GERD, asthmatic bronchitis 1 year ago, non smoker, presented to the ER with one week hx of wheezing, no productive coughs, but no CP, fever chills, nausea or vomiting. She has been given oral prednisone, and has increased use of inhaler without improvement.   Evaluation in the ER included a CXR which showed no infiltrate or edema, normal WBC, HB, renal fx test, electrolyte, but her BS was elevated at 322.  She was give nebs, and IV solumedrol, and hospitalist was asekd to admit her for severe asthmatic bronchitis.   ED Course:  See above.  Rewiew of Systems:  Constitutional: Negative for malaise, fever and chills. No significant weight loss or weight gain Eyes: Negative for eye pain, redness and discharge, diplopia, visual changes, or flashes of light. ENMT: Negative for ear pain, hoarseness, nasal congestion, sinus pressure and sore throat. No headaches; tinnitus, drooling, or problem swallowing. Cardiovascular: Negative for chest pain, palpitations, diaphoresis, dyspnea and peripheral edema. ; No orthopnea, PND Respiratory: Negative for cough, hemoptysis, and stridor. No pleuritic chestpain. Gastrointestinal: Negative for diarrhea, constipation,  melena, blood in stool, hematemesis, jaundice and rectal bleeding.    Genitourinary: Negative for frequency, dysuria, incontinence,flank pain and hematuria; Musculoskeletal: Negative for back pain and neck pain. Negative for swelling and trauma.;  Skin: . Negative for pruritus, rash, abrasions, bruising and skin lesion.; ulcerations Neuro: Negative for headache, lightheadedness and neck stiffness. Negative for weakness, altered level of consciousness , altered mental status, extremity  weakness, burning feet, involuntary movement, seizure and syncope.  Psych: negative for anxiety, depression, insomnia, tearfulness, panic attacks, hallucinations, paranoia, suicidal or homicidal ideation    Past Medical History:  Diagnosis Date  . Diabetes mellitus type 2, controlled ([place])    x5 yrs  . Heart murmur   . Hypertension    x 5 yrs  . Renal disorder    renal stones   Past Surgical History:  Procedure Laterality Date  . [place]  . COLONOSCOPY  03/27/2011   Procedure: COLONOSCOPY;  Surgeon: Rogene Houston, MD;  Location: [facility]ITE;  Service: Endoscopy;  Laterality: N/A;  2:00  . CYSTOSCOPY W/ URETERAL STENT PLACEMENT Left 11/08/2015   Procedure: CYSTOSCOPY, URETEROSCOPY, WITH STONE BASKET EXTRACTION;  Surgeon: Raynelle Bring, MD;  Location: [facility]S;  Service: Urology;  Laterality: Left;  . CYSTOSCOPY/RETROGRADE/URETEROSCOPY/STONE EXTRACTION WITH BASKET Right 07/03/2015   Procedure: CYSTOSCOPY/RETROGRADE/URETEROSCOPY/STONE EXTRACTION WITH BASKET;  Surgeon: Irine Seal, MD;  Location: [facility]S;  Service: Urology;  Laterality: Right;  . ENDOMETRIAL ABLATION    . KIDNEY STONE SURGERY     multiple  . SHOULDER SURGERY    . TONSILLECTOMY    . TUBAL LIGATION  1991     reports that she has never smoked. She has never used smokeless tobacco. She reports that she drinks alcohol. She reports that she does not use drugs.  Allergies  Allergen Reactions  . Sulfa Antibiotics Anaphylaxis    Family History  Problem Relation Age of Onset  . COPD Mother   . Cancer Father      Prior to Admission medications   Medication Sig Start Date End Date Taking? Authorizing Provider  albuterol (PROVENTIL) (2.5 MG/3ML) 0.083% nebulizer solution Take  2.5 mg by nebulization every 6 (six) hours as needed. 07/17/16  Yes Historical Provider, MD  aspirin EC 81 MG tablet Take 1 tablet by mouth on Monday, Wednesday, and Friday   Yes Historical Provider, MD  carvedilol (COREG) 25 MG  tablet Take 25 mg by mouth 2 (two) times daily with a meal.   Yes Historical Provider, MD  glimepiride (AMARYL) 2 MG tablet Take 2 mg by mouth daily. 06/08/15  Yes Historical Provider, MD  hydrochlorothiazide (HYDRODIURIL) 25 MG tablet Take 25 mg by mouth daily.  06/28/13  Yes Historical Provider, MD  liothyronine (CYTOMEL) 5 MCG tablet Take 5 mcg by mouth daily.   Yes Historical Provider, MD  meloxicam (MOBIC) 15 MG tablet Take 15 mg by mouth daily as needed for pain.    Yes Historical Provider, MD  metFORMIN (GLUCOPHAGE-XR) 500 MG 24 hr tablet Take 1,000 mg by mouth 2 (two) times daily. 04/17/15  Yes Historical Provider, MD  metroNIDAZOLE (METROGEL) 0.75 % gel Apply 1 application topically daily as needed (for rosacia).  05/19/15  Yes Historical Provider, MD  pravastatin (PRAVACHOL) 10 MG tablet Take 10 mg by mouth every evening. 06/18/15  Yes Historical Provider, MD  predniSONE (DELTASONE) 20 MG tablet Take 3 tablets days 1-3, then 2 tablets days 4-8, and then 1 tablet days 9-12. Currently day 1/12.   Yes Historical Provider, MD  VENTOLIN HFA 108 (90 Base) MCG/ACT inhaler Inhale 2 puffs into the lungs every 6 (six) hours as needed for wheezing or shortness of breath.  11/03/15  Yes Historical Provider, MD  VICTOZA 18 MG/3ML SOPN Inject 1.2 mg into the skin daily.  10/31/15  Yes Historical Provider, MD  diazepam (VALIUM) 5 MG tablet Take 1 tablet prior to injection in back 04/11/16   Historical Provider, MD  metoCLOPramide (REGLAN) 10 MG tablet Take 1 tablet (10 mg total) by mouth every 6 (six) hours as needed for nausea. 5/32/99   Delora Fuel, MD  oxyCODONE-acetaminophen (PERCOCET) 5-325 MG tablet Take 1 tablet by mouth every 4 (four) hours as needed for moderate pain. 2/42/68   Delora Fuel, MD  tamsulosin (FLOMAX) 0.4 MG CAPS capsule Take 1 capsule (0.4 mg total) by mouth daily. 3/41/96   Delora Fuel, MD  traMADol (ULTRAM) 50 MG tablet Take 50-100 mg by mouth every 6 (six) hours as needed (for pain.).  Reported on 09/28/2015    Historical Provider, MD    Physical Exam: Vitals:   07/18/16 1300 07/18/16 1327 07/18/16 1500 07/18/16 1516  BP: 119/78     Pulse: 81  102   Resp: 17  20   Temp:      TempSrc:      SpO2: 95% 96% 95% 97%  Weight:      Height:          Constitutional: NAD, calm, comfortable Vitals:   07/18/16 1300 07/18/16 1327 07/18/16 1500 07/18/16 1516  BP: 119/78     Pulse: 81  102   Resp: 17  20   Temp:      TempSrc:      SpO2: 95% 96% 95% 97%  Weight:      Height:       Eyes: PERRL, lids and conjunctivae normal ENMT: Mucous membranes are moist. Posterior pharynx clear of any exudate or lesions.Normal dentition.  Neck: normal, supple, no masses, no thyromegaly Respiratory: moderate to significant bilaterally wheezing, no crackles. Normal respiratory effort. No accessory muscle use.  Cardiovascular: Regular rate and rhythm, no murmurs / rubs /  gallops. No extremity edema. 2+ pedal pulses. No carotid bruits.  Abdomen: no tenderness, no masses palpated. No hepatosplenomegaly. Bowel sounds positive.  Musculoskeletal: no clubbing / cyanosis. No joint deformity upper and lower extremities. Good ROM, no contractures. Normal muscle tone.  Skin: no rashes, lesions, ulcers. No induration Neurologic: CN 2-12 grossly intact. Sensation intact, DTR normal. Strength 5/5 in all 4.  Psychiatric: Normal judgment and insight. Alert and oriented x 3. Normal mood.     Labs on Admission: I have personally reviewed following labs and imaging studies  CBC:  Recent Labs Lab 07/18/16 1010  WBC 6.6  NEUTROABS 5.7  HGB 13.0  HCT 39.6  MCV 96.8  PLT 882   Basic Metabolic Panel:  Recent Labs Lab 07/18/16 1010  NA 138  K 3.7  CL 104  CO2 25  GLUCOSE 322*  BUN 16  CREATININE 0.66  CALCIUM 9.0   GFR: Estimated Creatinine Clearance: 69.1 mL/min (by C-G formula based on SCr of 0.66 mg/dL). Liver Function Tests:  Recent Labs Lab 07/18/16 1010  AST 25  ALT 26    ALKPHOS 37*  BILITOT 0.5  PROT 6.4*  ALBUMIN 3.9   Cardiac Enzymes:  Recent Labs Lab 07/18/16 1010  TROPONINI <0.03   Urine analysis:    Component Value Date/Time   COLORURINE GREEN (A) 11/07/2015 0540   APPEARANCEUR CLEAR 11/07/2015 0540   LABSPEC 1.010 11/07/2015 0540   PHURINE 6.5 11/07/2015 0540   GLUCOSEU NEGATIVE 11/07/2015 0540   HGBUR LARGE (A) 11/07/2015 0540   BILIRUBINUR NEGATIVE 11/07/2015 0540   KETONESUR NEGATIVE 11/07/2015 0540   PROTEINUR NEGATIVE 11/07/2015 0540   UROBILINOGEN 0.2 10/17/2014 0834   NITRITE NEGATIVE 11/07/2015 0540   LEUKOCYTESUR TRACE (A) 11/07/2015 0540   Radiological Exams on Admission: Dg Chest 2 View  Result Date: 07/18/2016 CLINICAL DATA:  Cough, congestion, wheezing, shortness of breath EXAM: CHEST  2 VIEW COMPARISON:  01/12/2015 FINDINGS: The heart size and mediastinal contours are within normal limits. Both lungs are clear. The visualized skeletal structures are unremarkable. IMPRESSION: No active cardiopulmonary disease. Electronically Signed   By: Kathreen Devoid   On: 07/18/2016 10:02   EKG: Independently reviewed.   Assessment/Plan Active Problems:   Asthmatic bronchitis   DM (diabetes mellitus) (HCC)   GERD (gastroesophageal reflux disease)   HLD (hyperlipidemia)   HTN (hypertension)    PLAN:   Asthmatic bronchitis:  No definite precipitating factor, though her wheezing may have been aggravated by her GERD.  Will continue with iV steroids, nebs, and give IV Zithromax.   DM:  BS have been elevated.  Will continue with her meds, and cover additional hyperglycemia with resistant insulin sliding scale.   HLD:  Continue with statin.  GERD:  Will Tx with BID PPI as this can aggravate her wheezing.    DVT prophylaxis: Lovenox SQ Code Status: FULL CODE.  Family Communication: None.  Disposition Plan: Home when better.  Consults called: None.  Admission status: Inpatient as she has significant wheezing, and will likely  require several days of tx with IV steroids, nebs, and insulin coverages.    Jareb Radoncic MD FACP. [facility]  If 7PM-7AM, please contact night-coverage [email or web address] Password [email or web address]  07/18/2016, 4:11 PM

## 2016-07-18 NOTE — ED Provider Notes (Signed)
Medical screening examination/treatment/procedure(s) were conducted as a shared visit with non-physician practitioner(s) and myself.  I personally evaluated the patient during the encounter.   EKG Interpretation  Date/Time:  Thursday July 18 2016 09:31:09 EST Ventricular Rate:  91 PR Interval:    QRS Duration: 90 QT Interval:  319 QTC Calculation: 393 R Axis:   40 Text Interpretation:  Sinus rhythm Short PR interval No significant change since last tracing Confirmed by Eleftherios Dudenhoeffer  MD, Issacc Merlo [phone]) on 07/18/2016 10:04:29 AM       Results for orders placed or performed during the hospital encounter of 07/18/16  CBC with Differential  Result Value Ref Range   WBC 6.6 4.0 - 10.5 K/uL   RBC 4.09 3.87 - 5.11 MIL/uL   Hemoglobin 13.0 12.0 - 15.0 g/dL   HCT 39.6 36.0 - 46.0 %   MCV 96.8 78.0 - 100.0 fL   MCH 31.8 26.0 - 34.0 pg   MCHC 32.8 30.0 - 36.0 g/dL   RDW 12.7 11.5 - 15.5 %   Platelets 207 150 - 400 K/uL   Neutrophils Relative % 86 %   Neutro Abs 5.7 1.7 - 7.7 K/uL   Lymphocytes Relative 10 %   Lymphs Abs 0.6 (L) 0.7 - 4.0 K/uL   Monocytes Relative 4 %   Monocytes Absolute 0.2 0.1 - 1.0 K/uL   Eosinophils Relative 0 %   Eosinophils Absolute 0.0 0.0 - 0.7 K/uL   Basophils Relative 0 %   Basophils Absolute 0.0 0.0 - 0.1 K/uL  Troponin I  Result Value Ref Range   Troponin I <0.03 <0.03 ng/mL  Comprehensive metabolic panel  Result Value Ref Range   Sodium 138 135 - 145 mmol/L   Potassium 3.7 3.5 - 5.1 mmol/L   Chloride 104 101 - 111 mmol/L   CO2 25 22 - 32 mmol/L   Glucose, Bld 322 (H) 65 - 99 mg/dL   BUN 16 6 - 20 mg/dL   Creatinine, Ser 0.66 0.44 - 1.00 mg/dL   Calcium 9.0 8.9 - 10.3 mg/dL   Total Protein 6.4 (L) 6.5 - 8.1 g/dL   Albumin 3.9 3.5 - 5.0 g/dL   AST 25 15 - 41 U/L   ALT 26 14 - 54 U/L   Alkaline Phosphatase 37 (L) 38 - 126 U/L   Total Bilirubin 0.5 0.3 - 1.2 mg/dL   GFR calc non Af Amer >60 >60 mL/min   GFR calc Af Amer >60 >60 mL/min   Anion gap 9  5 - 15   Dg Chest 2 View  Result Date: 07/18/2016 CLINICAL DATA:  Cough, congestion, wheezing, shortness of breath EXAM: CHEST  2 VIEW COMPARISON:  01/12/2015 FINDINGS: The heart size and mediastinal contours are within normal limits. Both lungs are clear. The visualized skeletal structures are unremarkable. IMPRESSION: No active cardiopulmonary disease. Electronically Signed   By: Kathreen Devoid   On: 07/18/2016 10:02   Patient has been struggling with reactive airway disease bronchitis and wheezing now for about a month. Patient does not have any known history of asthma or COPD. Patient's been on and off steroids as well as Levaquin. Today's chest x-ray shows no evidence of pneumonia. Patient definitely with bilateral wheezing despite multiple nebulizer in continuous neb treatments here. Patient will require readmission. Patient recently started back up on steroids. Will going give an IV dose of 125 mg a steroid.  Patient alert and oriented lungs with bilateral wheezing. Oxygen saturation okay.  Patient's primary care doctor is Dr.  Roselie Skinner, MD 07/18/16 [phone]

## 2016-07-18 NOTE — ED Triage Notes (Signed)
PT states she has had a cough since 06/17/16. PT states Levaquin completion  x1 week ago with little to no relief. PT had dyspnea on exertion and a productive green sputum cough. PT denies any swelling in her extremities. PT states her PCP is out of office and she was seen at [facility] yesterday and they gave her a steriod pack, nebs and inhaler. PT states she returned to his office this am and was told to come to ED d/t SOB.

## 2016-07-18 NOTE — ED Provider Notes (Signed)
[facility] DEPT Provider Note   CSN: [id number] Arrival date & time: 07/18/16  1884     History   Chief Complaint Chief Complaint  Patient presents with  . Shortness of Breath    HPI Kathy Tran is a 61 y.o. female with history of hypertension, diabetes who presents with a 1 month history of productive cough and shortness of breath. Patient has been given to different antibiotics and 2 rounds of prednisone without relief. Patient most recently started second prednisone taper yesterday. Patient was given Augmentin on 06/17/2016 and Levaquin on 07/09/2016. Patient continues to have cough and worsening shortness of breath. Patient has shortness of breath just speaking to me, however her shortness of breath is worse on exertion. Patient denies any fevers. She has associated soreness in her abdominal and back muscles from coughing. She denies chest pain, other abdominal pain, nausea, vomiting, urinary symptoms.  HPI  Past Medical History:  Diagnosis Date  . Diabetes mellitus type 2, controlled ([place])    x5 yrs  . Heart murmur   . Hypertension    x 5 yrs  . Renal disorder    renal stones    Patient Active Problem List   Diagnosis Date Noted  . Asthmatic bronchitis 07/18/2016  . DM (diabetes mellitus) ([place]) 07/18/2016  . GERD (gastroesophageal reflux disease) 07/18/2016  . HLD (hyperlipidemia) 07/18/2016  . HTN (hypertension) 07/18/2016  . History of colonic polyps 06/12/2016    Past Surgical History:  Procedure Laterality Date  . [place]  . COLONOSCOPY  03/27/2011   Procedure: COLONOSCOPY;  Surgeon: Rogene Houston, MD;  Location: [facility] SUITE;  Service: Endoscopy;  Laterality: N/A;  2:00  . CYSTOSCOPY W/ URETERAL STENT PLACEMENT Left 11/08/2015   Procedure: CYSTOSCOPY, URETEROSCOPY, WITH STONE BASKET EXTRACTION;  Surgeon: Raynelle Bring, MD;  Location: [facility] ORS;  Service: Urology;  Laterality: Left;  . CYSTOSCOPY/RETROGRADE/URETEROSCOPY/STONE EXTRACTION  WITH BASKET Right 07/03/2015   Procedure: CYSTOSCOPY/RETROGRADE/URETEROSCOPY/STONE EXTRACTION WITH BASKET;  Surgeon: Irine Seal, MD;  Location: [facility] ORS;  Service: Urology;  Laterality: Right;  . ENDOMETRIAL ABLATION    . KIDNEY STONE SURGERY     multiple  . SHOULDER SURGERY    . TONSILLECTOMY    . TUBAL LIGATION  1991    OB History    No data available       Home Medications    Prior to Admission medications   Medication Sig Start Date End Date Taking? Authorizing Provider  albuterol (PROVENTIL) (2.5 MG/3ML) 0.083% nebulizer solution Take 2.5 mg by nebulization every 6 (six) hours as needed. 07/17/16  Yes Historical Provider, MD  aspirin EC 81 MG tablet Take 1 tablet by mouth on Monday, Wednesday, and Friday   Yes Historical Provider, MD  carvedilol (COREG) 25 MG tablet Take 25 mg by mouth 2 (two) times daily with a meal.   Yes Historical Provider, MD  glimepiride (AMARYL) 2 MG tablet Take 2 mg by mouth daily. 06/08/15  Yes Historical Provider, MD  hydrochlorothiazide (HYDRODIURIL) 25 MG tablet Take 25 mg by mouth daily.  06/28/13  Yes Historical Provider, MD  liothyronine (CYTOMEL) 5 MCG tablet Take 5 mcg by mouth daily.   Yes Historical Provider, MD  meloxicam (MOBIC) 15 MG tablet Take 15 mg by mouth daily as needed for pain.    Yes Historical Provider, MD  metFORMIN (GLUCOPHAGE-XR) 500 MG 24 hr tablet Take 1,000 mg by mouth 2 (two) times daily. 04/17/15  Yes Historical Provider, MD  metroNIDAZOLE (METROGEL)  0.75 % gel Apply 1 application topically daily as needed (for rosacia).  05/19/15  Yes Historical Provider, MD  pravastatin (PRAVACHOL) 10 MG tablet Take 10 mg by mouth every evening. 06/18/15  Yes Historical Provider, MD  predniSONE (DELTASONE) 20 MG tablet Take 3 tablets days 1-3, then 2 tablets days 4-8, and then 1 tablet days 9-12. Currently day 1/12.   Yes Historical Provider, MD  VENTOLIN HFA 108 (90 Base) MCG/ACT inhaler Inhale 2 puffs into the lungs every 6 (six) hours as needed for  wheezing or shortness of breath.  11/03/15  Yes Historical Provider, MD  VICTOZA 18 MG/3ML SOPN Inject 1.2 mg into the skin daily.  10/31/15  Yes Historical Provider, MD  diazepam (VALIUM) 5 MG tablet Take 1 tablet prior to injection in back 04/11/16   Historical Provider, MD  metoCLOPramide (REGLAN) 10 MG tablet Take 1 tablet (10 mg total) by mouth every 6 (six) hours as needed for nausea. 2/50/53   Delora Fuel, MD  oxyCODONE-acetaminophen (PERCOCET) 5-325 MG tablet Take 1 tablet by mouth every 4 (four) hours as needed for moderate pain. 9/76/73   Delora Fuel, MD  tamsulosin (FLOMAX) 0.4 MG CAPS capsule Take 1 capsule (0.4 mg total) by mouth daily. 08/30/35   Delora Fuel, MD  traMADol (ULTRAM) 50 MG tablet Take 50-100 mg by mouth every 6 (six) hours as needed (for pain.). Reported on 09/28/2015    Historical Provider, MD    Family History Family History  Problem Relation Age of Onset  . COPD Mother   . Cancer Father     Social History Social History  Substance Use Topics  . Smoking status: Never Smoker  . Smokeless tobacco: Never Used  . Alcohol use Yes     Comment: occ     Allergies   Sulfa antibiotics   Review of Systems Review of Systems  Constitutional: Negative for chills and fever.  HENT: Positive for congestion. Negative for facial swelling and sore throat.   Respiratory: Positive for cough, shortness of breath and wheezing.   Cardiovascular: Negative for chest pain.  Gastrointestinal: Negative for abdominal pain, diarrhea, nausea and vomiting.  Genitourinary: Negative for dysuria.  Musculoskeletal: Negative for back pain.  Skin: Negative for rash and wound.  Neurological: Negative for headaches.  Psychiatric/Behavioral: The patient is not nervous/anxious.      Physical Exam Updated Vital Signs BP 140/79   Pulse 101   Temp 97.8 F (36.6 C) (Oral)   Resp 17   Ht 5\' 2"  (1.575 m)   Wt 67.6 kg   SpO2 94%   BMI 27.25 kg/m   Physical Exam  Constitutional: She  appears well-developed and well-nourished. No distress.  Patient notably short of breath while speaking to me, however speaking in complete sentences  HENT:  Head: Normocephalic and atraumatic.  Mouth/Throat: Oropharynx is clear and moist. No oropharyngeal exudate.  Eyes: Conjunctivae are normal. Pupils are equal, round, and reactive to light. Right eye exhibits no discharge. Left eye exhibits no discharge. No scleral icterus.  Neck: Normal range of motion. Neck supple. No thyromegaly present.  Cardiovascular: Normal rate, regular rhythm, normal heart sounds and intact distal pulses.  Exam reveals no gallop and no friction rub.   No murmur heard. Pulmonary/Chest: Effort normal. No stridor. No respiratory distress. She has wheezes (expiratory wheezes throughout). She has rales.  Abdominal: Soft. Bowel sounds are normal. She exhibits no distension. There is no tenderness. There is no rebound and no guarding.  Musculoskeletal: She exhibits no  edema.  Lymphadenopathy:    She has no cervical adenopathy.  Neurological: She is alert. Coordination normal.  Skin: Skin is warm and dry. No rash noted. She is not diaphoretic. No pallor.  Psychiatric: She has a normal mood and affect.  Nursing note and vitals reviewed.    ED Treatments / Results  Labs (all labs ordered are listed, but only abnormal results are displayed) Labs Reviewed  CBC WITH DIFFERENTIAL/PLATELET - Abnormal; Notable for the following:       Result Value   Lymphs Abs 0.6 (*)    All other components within normal limits  COMPREHENSIVE METABOLIC PANEL - Abnormal; Notable for the following:    Glucose, Bld 322 (*)    Total Protein 6.4 (*)    Alkaline Phosphatase 37 (*)    All other components within normal limits  TROPONIN I  HIV ANTIBODY (ROUTINE TESTING)  CBC  CREATININE, SERUM  TSH  BASIC METABOLIC PANEL  CBC    EKG  EKG Interpretation  Date/Time:  Thursday July 18 2016 09:31:09 EST Ventricular Rate:  91 PR  Interval:    QRS Duration: 90 QT Interval:  319 QTC Calculation: 393 R Axis:   40 Text Interpretation:  Sinus rhythm Short PR interval No significant change since last tracing Confirmed by ZACKOWSKI  MD, SCOTT ([id number]) on 07/18/2016 10:04:29 AM       Radiology Dg Chest 2 View  Result Date: 07/18/2016 CLINICAL DATA:  Cough, congestion, wheezing, shortness of breath EXAM: CHEST  2 VIEW COMPARISON:  01/12/2015 FINDINGS: The heart size and mediastinal contours are within normal limits. Both lungs are clear. The visualized skeletal structures are unremarkable. IMPRESSION: No active cardiopulmonary disease. Electronically Signed   By: Kathreen Devoid   On: 07/18/2016 10:02    Procedures Procedures (including critical care time)  Medications Ordered in ED Medications  albuterol (PROVENTIL,VENTOLIN) solution continuous neb (10 mg/hr Nebulization New Bag/Given 07/18/16 1324)  aspirin EC tablet 81 mg (not administered)  liothyronine (CYTOMEL) tablet 5 mcg (not administered)  liraglutide SOPN 1.2 mg (not administered)  metoCLOPramide (REGLAN) tablet 10 mg (not administered)  oxyCODONE-acetaminophen (PERCOCET/ROXICET) 5-325 MG per tablet 1 tablet (not administered)  tamsulosin (FLOMAX) capsule 0.4 mg (not administered)  glimepiride (AMARYL) tablet 2 mg (not administered)  metFORMIN (GLUCOPHAGE-XR) 24 hr tablet 1,000 mg (not administered)  pravastatin (PRAVACHOL) tablet 10 mg (not administered)  carvedilol (COREG) tablet 25 mg (not administered)  enoxaparin (LOVENOX) injection 40 mg (not administered)  0.9 %  sodium chloride infusion (not administered)  ondansetron (ZOFRAN) tablet 4 mg (not administered)    Or  ondansetron (ZOFRAN) injection 4 mg (not administered)  albuterol (PROVENTIL) (2.5 MG/3ML) 0.083% nebulizer solution 2.5 mg (not administered)  insulin aspart (novoLOG) injection 0-20 Units (not administered)  insulin aspart (novoLOG) injection 0-5 Units (not administered)  azithromycin  (ZITHROMAX) 500 mg in dextrose 5 % 250 mL IVPB (not administered)  methylPREDNISolone sodium succinate (SOLU-MEDROL) 40 mg/mL injection 40 mg (not administered)  albuterol (PROVENTIL) (2.5 MG/3ML) 0.083% nebulizer solution 2.5 mg (not administered)  pantoprazole (PROTONIX) EC tablet 40 mg (not administered)  ipratropium-albuterol (DUONEB) 0.5-2.5 (3) MG/3ML nebulizer solution 3 mL (3 mLs Nebulization Given 07/18/16 1059)  ipratropium-albuterol (DUONEB) 0.5-2.5 (3) MG/3ML nebulizer solution 3 mL (3 mLs Nebulization Given 07/18/16 1236)  methylPREDNISolone sodium succinate (SOLU-MEDROL) 125 mg/2 mL injection 125 mg (125 mg Intravenous Given 07/18/16 1221)     Initial Impression / Assessment and Plan / ED Course  I have reviewed the triage vital signs and  the nursing notes.  Pertinent labs & imaging results that were available during my care of the patient were reviewed by me and considered in my medical decision making (see chart for details).     1125 On reevaluation after DuoNeb, patient states her shortness of breath is mildly improved, but she still feels and hears wheezing and is still short of breath. On lung exam, wheezing somewhat improved, however still expiratory wheezes, especially in the upper lung fields. Will DuoNeb and reevaluate.  On reevaluation after second DuoNeb treatment, patient's wheezing is not improved and patient continues to have shortness of breath without much improvement. Continue to CAT.  After CAT, patient continues to have expiratory wheezing and still short of breath.  CBC unremarkable. CMP shows glucose 322, protein 6.4, alkaline phosphatase 37. Troponin negative. CXR shows no acute cardio pulmonary disease. EKG shows NSR, short PR interval, but no seeming change since last tracing. I consulted Dr. Marin Comment who will admit the patient for further evaluation and treatment. Patient understands and agrees to plan. Patient also evaluated by Dr. Rogene Houston who guided the  patient's patient and agrees with plan.  Final Clinical Impressions(s) / ED Diagnoses   Final diagnoses:  SOB (shortness of breath)  Wheezing  Cough    New Prescriptions Current Discharge Medication List       Frederica Kuster, PA-C 07/18/16 1742    Fredia Sorrow, MD 07/20/16 0800

## 2016-07-19 LAB — CBC
HEMATOCRIT: 39.2 % (ref 36.0–46.0)
HEMOGLOBIN: 12.9 g/dL (ref 12.0–15.0)
MCH: 32.2 pg (ref 26.0–34.0)
MCHC: 32.9 g/dL (ref 30.0–36.0)
MCV: 97.8 fL (ref 78.0–100.0)
Platelets: 202 10*3/uL (ref 150–400)
RBC: 4.01 MIL/uL (ref 3.87–5.11)
RDW: 12.9 % (ref 11.5–15.5)
WBC: 8.5 10*3/uL (ref 4.0–10.5)

## 2016-07-19 LAB — GLUCOSE, CAPILLARY
GLUCOSE-CAPILLARY: 296 mg/dL — AB (ref 65–99)
Glucose-Capillary: 178 mg/dL — ABNORMAL HIGH (ref 65–99)
Glucose-Capillary: 301 mg/dL — ABNORMAL HIGH (ref 65–99)
Glucose-Capillary: 330 mg/dL — ABNORMAL HIGH (ref 65–99)
Glucose-Capillary: 352 mg/dL — ABNORMAL HIGH (ref 65–99)

## 2016-07-19 LAB — BASIC METABOLIC PANEL
ANION GAP: 8 (ref 5–15)
BUN: 17 mg/dL (ref 6–20)
CHLORIDE: 103 mmol/L (ref 101–111)
CO2: 26 mmol/L (ref 22–32)
Calcium: 9 mg/dL (ref 8.9–10.3)
Creatinine, Ser: 0.53 mg/dL (ref 0.44–1.00)
GFR calc Af Amer: >60 mL/min (ref >60)
GLUCOSE: 312 mg/dL — AB (ref 65–99)
POTASSIUM: 3.7 mmol/L (ref 3.5–5.1)
SODIUM: 137 mmol/L (ref 135–145)

## 2016-07-19 LAB — HIV ANTIBODY (ROUTINE TESTING W REFLEX): HIV SCREEN 4TH GENERATION: NONREACTIVE

## 2016-07-19 MED ORDER — METHYLPREDNISOLONE SODIUM SUCC 125 MG IJ SOLR
80.0000 mg | Freq: Four times a day (QID) | INTRAMUSCULAR | Status: DC
  Administered 2016-07-19 – 2016-07-20 (×4): 80 mg via INTRAVENOUS
  Filled 2016-07-19 (×4): qty 2

## 2016-07-19 MED ORDER — DM-GUAIFENESIN ER 30-600 MG PO TB12
1.0000 | ORAL_TABLET | Freq: Two times a day (BID) | ORAL | Status: DC
  Administered 2016-07-19 – 2016-07-23 (×9): 1 via ORAL
  Filled 2016-07-19 (×9): qty 1

## 2016-07-19 NOTE — Progress Notes (Signed)
Notified from pharmacy that we do not have the diabetes pen that she uses. Pt states she can have it brought from home by her husband. When it is brought, will notify pharmacy.

## 2016-07-19 NOTE — Progress Notes (Signed)
Patient admitted with asthmatic bronchitis presumably due to pollen she never smoked chest x-ray is unremarkable after 24 hours of oral steroids and nebulizer therapy at home she did not break and is admitted with IV Solu-Medrol 80 mg every 6 hours as well as albuterol nebulizer she has less pronounced wheeze this morning than yesterday in office ALIZABETH ANTONIO [id number] DOB: 1955/07/01 DOA: 07/18/2016 PCP: Asencion Noble, MD   Physical Exam: Blood pressure 131/73, pulse 90, temperature 97.7 F (36.5 C), temperature source Oral, resp. rate 20, height 5\' 2"  (1.575 m), weight 67.6 kg (149 lb), SpO2 94 %. Moderate inspiratory and expiratory wheeze scattered rhonchi no rales audible heart regular rhythm no S3 or S4 no heaves thrills rubs   Investigations:  No results found for this or any previous visit (from the past 240 hour(s)).   Basic Metabolic Panel:  Recent Labs  07/18/16 1010 07/19/16 0450  NA 138 137  K 3.7 3.7  CL 104 103  CO2 25 26  GLUCOSE 322* 312*  BUN 16 17  CREATININE 0.66 0.53  CALCIUM 9.0 9.0   Liver Function Tests:  Recent Labs  07/18/16 1010  AST 25  ALT 26  ALKPHOS 37*  BILITOT 0.5  PROT 6.4*  ALBUMIN 3.9     CBC:  Recent Labs  07/18/16 1010 07/19/16 0450  WBC 6.6 8.5  NEUTROABS 5.7  --   HGB 13.0 12.9  HCT 39.6 39.2  MCV 96.8 97.8  PLT 207 202    Dg Chest 2 View  Result Date: 07/18/2016 CLINICAL DATA:  Cough, congestion, wheezing, shortness of breath EXAM: CHEST  2 VIEW COMPARISON:  01/12/2015 FINDINGS: The heart size and mediastinal contours are within normal limits. Both lungs are clear. The visualized skeletal structures are unremarkable. IMPRESSION: No active cardiopulmonary disease. Electronically Signed   By: Kathreen Devoid   On: 07/18/2016 10:02      Medications:  Impression:  Active Problems:   Asthmatic bronchitis   DM (diabetes mellitus) ([facility])   GERD (gastroesophageal reflux disease)   HLD (hyperlipidemia)   HTN  (hypertension)     Plan:Increase Solu-Medrol to 80 IV every 6 hours continue sliding scale insulin coverage for diabetes and Mucinex DM for persistent cough. Every 12 hours to new IV Zithromax  Consultants: Dr. Orvan Falconer, physician extraordinarre   Procedures   Antibiotics: IV Zithromax       Time spent: 30 minutes   LOS: 1 day   Chong January M   07/19/2016, 7:05 AM

## 2016-07-19 NOTE — Progress Notes (Signed)
Inpatient Diabetes Program Recommendations  AACE/ADA: New Consensus Statement on Inpatient Glycemic Control (2015)  Target Ranges:  Prepandial:   less than 140 mg/dL      Peak postprandial:   less than 180 mg/dL (1-2 hours)      Critically ill patients:  140 - 180 mg/dL   Results for SADAF, PRZYBYSZ (MRN [id number]) as of 07/19/2016 08:21  Ref. Range 07/18/2016 21:36 07/19/2016 00:17 07/19/2016 07:40  Glucose-Capillary Latest Ref Range: 65 - 99 mg/dL 457 (H) 330 (H) 301 (H)  Results for YAZMINA, PAREJA (MRN [id number]) as of 07/19/2016 08:21  Ref. Range 07/18/2016 10:10 07/19/2016 04:50  Glucose Latest Ref Range: 65 - 99 mg/dL 322 (H) 312 (H)   Review of Glycemic Control  Diabetes history: DM2 Outpatient Diabetes medications: Amaryl 2 mg daily, Victoza 1.2 mg daily, Metformin XR 1000 mg BID Current orders for Inpatient glycemic control: Novolog 0-20 units TID with meals, Novolog 0-5 units QHS,Amaryl 2 mg daily, Victoza 1.2 mg daily, Metformin XR 1000 mg BID   Inpatient Diabetes Program Recommendations: Insulin - Basal: If steroids are continued, please consider ordering Lantus 13 units Q24H starting now (based on 67 kg x 0.2 units).  Thanks, Barnie Alderman, RN, MSN, CDE Diabetes Coordinator Inpatient Diabetes Program [phone] (Team Pager from 8am to 5pm)

## 2016-07-20 LAB — GLUCOSE, CAPILLARY
GLUCOSE-CAPILLARY: 322 mg/dL — AB (ref 65–99)
GLUCOSE-CAPILLARY: 276 mg/dL — AB (ref 65–99)
GLUCOSE-CAPILLARY: 249 mg/dL — AB (ref 65–99)
Glucose-Capillary: 215 mg/dL — ABNORMAL HIGH (ref 65–99)

## 2016-07-20 MED ORDER — METHYLPREDNISOLONE SODIUM SUCC 40 MG IJ SOLR: 40.0000 mg | Freq: Four times a day (QID) | INTRAMUSCULAR | Status: DC

## 2016-07-20 MED ORDER — ALBUTEROL SULFATE (2.5 MG/3ML) 0.083% IN NEBU
2.5000 mg | INHALATION_SOLUTION | RESPIRATORY_TRACT | Status: DC
  Administered 2016-07-20 – 2016-07-22 (×8): 2.5 mg via RESPIRATORY_TRACT
  Filled 2016-07-20 (×7): qty 3

## 2016-07-20 MED ORDER — METHYLPREDNISOLONE SODIUM SUCC 125 MG IJ SOLR
125.0000 mg | Freq: Three times a day (TID) | INTRAMUSCULAR | Status: DC
Start: 2016-07-20 — End: 2016-07-22
  Administered 2016-07-20 – 2016-07-22 (×6): 125 mg via INTRAVENOUS
  Filled 2016-07-20 (×6): qty 2

## 2016-07-20 NOTE — Progress Notes (Signed)
Pt thinks that she may be getting a yeast infection. C/o of itching around the vaginal area.

## 2016-07-20 NOTE — Progress Notes (Signed)
Patient has continued and somewhat worsened his return expiratory wheezing over yesterday with no history is only due to pollen count will increase Solu-Medrol to 125 IV every 8 and increase frequency of nebulizers to every 4 hours while awake BRADLEY HANDYSIDE [id number] DOB: 17-Feb-1956 DOA: 07/18/2016 PCP: Asencion Noble, MD   Physical Exam: Blood pressure (!) 152/81, pulse 84, temperature 97.6 F (36.4 C), temperature source Oral, resp. rate 18, height 5\' 2"  (1.575 m), weight 67.6 kg (149 lb), SpO2 94 %. Lungs bilateral inspiratory and answered and expiratory moderate wheezes scattered coarse rhonchi no rales audible heart regular rhythm no S3-S4 no heaves thrills rubs  Investigations:  No results found for this or any previous visit (from the past 240 hour(s)).   Basic Metabolic Panel:  Recent Labs  07/18/16 1010 07/19/16 0450  NA 138 137  K 3.7 3.7  CL 104 103  CO2 25 26  GLUCOSE 322* 312*  BUN 16 17  CREATININE 0.66 0.53  CALCIUM 9.0 9.0   Liver Function Tests:  Recent Labs  07/18/16 1010  AST 25  ALT 26  ALKPHOS 37*  BILITOT 0.5  PROT 6.4*  ALBUMIN 3.9     CBC:  Recent Labs  07/18/16 1010 07/19/16 0450  WBC 6.6 8.5  NEUTROABS 5.7  --   HGB 13.0 12.9  HCT 39.6 39.2  MCV 96.8 97.8  PLT 207 202    No results found.    Medications:  Impression: Active Problems:   Asthmatic bronchitis   DM (diabetes mellitus) (HCC)   GERD (gastroesophageal reflux disease)   HLD (hyperlipidemia)   HTN (hypertension)     Plan:Increase Solu-Medrol to 125 IV every 8 hours increased frequency of albuterol nebulizers to every 4 hours while awake monitor glycemic control due to increasing steroids  Consultants: Pulmonary not available   Procedures   Antibiotics:          Time spent: 30 minutes   LOS: 2 days   Malashia Kamaka M   07/20/2016, 12:59 PM

## 2016-07-21 LAB — GLUCOSE, CAPILLARY
GLUCOSE-CAPILLARY: 232 mg/dL — AB (ref 65–99)
GLUCOSE-CAPILLARY: 220 mg/dL — AB (ref 65–99)
Glucose-Capillary: 297 mg/dL — ABNORMAL HIGH (ref 65–99)
Glucose-Capillary: 351 mg/dL — ABNORMAL HIGH (ref 65–99)

## 2016-07-21 NOTE — Progress Notes (Signed)
Patient continues with moderate inspiratory and expiratory wheezing today steroids were decreased to 48 hours ago with subsequent increase in severity of wheezing will maintain Solu-Medrol at current dose of 125 IV every 8 hours reducing it tomorrow Monday patient had nebulizers of albuterol increase to every 4 hours while awake she appears somewhat better is no known lung disease and never smoked presumably this is attributable to the pollen counts in [place] of flora outside? Kathy Tran [id number] DOB: [birth year] DOA: 07/18/2016 PCP: Asencion Noble, MD   Physical Exam: Blood pressure 139/77, pulse 72, temperature 97.7 F (36.5 C), temperature source Oral, resp. rate 18, height 5\' 2"  (1.575 m), weight 67.6 kg (149 lb), SpO2 95 %. Lungs show moderate inspiratory and expiratory wheeze scattered rhonchi no rales appreciable heart regular rhythm no S3 or S4 no heaves thrills rubs abdomen soft nontender bowel sounds normoactive   Investigations:  No results found for this or any previous visit (from the past 240 hour(s)).   Basic Metabolic Panel:  Recent Labs  07/19/16 0450  NA 137  K 3.7  CL 103  CO2 26  GLUCOSE 312*  BUN 17  CREATININE 0.53  CALCIUM 9.0   Liver Function Tests: No results for input(s): AST, ALT, ALKPHOS, BILITOT, PROT, ALBUMIN in the last 72 hours.   CBC:  Recent Labs  07/19/16 0450  WBC 8.5  HGB 12.9  HCT 39.2  MCV 97.8  PLT 202    No results found.    Medications:   Impression:  Active Problems:   Asthmatic bronchitis   DM (diabetes mellitus) (HCC)   GERD (gastroesophageal reflux disease)   HLD (hyperlipidemia)   HTN (hypertension)     Plan: Continue Solu-Medrol 125 IV every 8 continue albuterol nebulizer every 4 hours consider decreasing albuterol Monday a.m. per Dr. Willey Blade if he deems appropriate   Consultants:    Procedures   Antibiotics:          Time spent: 30 minutes   LOS: 3 days   Braden Cimo M   07/21/2016,  11:27 AM

## 2016-07-22 LAB — GLUCOSE, CAPILLARY
GLUCOSE-CAPILLARY: 262 mg/dL — AB (ref 65–99)
Glucose-Capillary: 249 mg/dL — ABNORMAL HIGH (ref 65–99)
Glucose-Capillary: 319 mg/dL — ABNORMAL HIGH (ref 65–99)
Glucose-Capillary: 273 mg/dL — ABNORMAL HIGH (ref 65–99)

## 2016-07-22 LAB — D-DIMER, QUANTITATIVE (NOT AT ARMC)

## 2016-07-22 MED ORDER — FLUCONAZOLE 100 MG PO TABS
150.0000 mg | ORAL_TABLET | Freq: Once | ORAL | Status: AC
  Administered 2016-07-22: 150 mg via ORAL
  Filled 2016-07-22: qty 2

## 2016-07-22 MED ORDER — INSULIN GLARGINE 100 UNIT/ML ~~LOC~~ SOLN
10.0000 [IU] | Freq: Every day | SUBCUTANEOUS | Status: DC
  Administered 2016-07-22: 10 [IU] via SUBCUTANEOUS
  Filled 2016-07-22 (×3): qty 0.1

## 2016-07-22 MED ORDER — ALBUTEROL SULFATE (2.5 MG/3ML) 0.083% IN NEBU
2.5000 mg | INHALATION_SOLUTION | RESPIRATORY_TRACT | Status: DC
  Administered 2016-07-22 – 2016-07-23 (×6): 2.5 mg via RESPIRATORY_TRACT
  Filled 2016-07-22 (×6): qty 3

## 2016-07-22 MED ORDER — METHYLPREDNISOLONE SODIUM SUCC 125 MG IJ SOLR
80.0000 mg | Freq: Three times a day (TID) | INTRAMUSCULAR | Status: DC
  Administered 2016-07-22: 62.5 mg via INTRAVENOUS
  Administered 2016-07-22 – 2016-07-23 (×2): 80 mg via INTRAVENOUS
  Filled 2016-07-22 (×3): qty 2

## 2016-07-22 NOTE — Care Management Note (Signed)
Case Management Note  Patient Details  Name: Kathy Tran MRN: [id number] Date of Birth: 1956/02/05  Subjective/Objective:                  Pt admitted with asthmatic bronchitis. She is from home, ind with ADL's. She has PCP, transportation to appointments and no difficulty affording medications. Pt plans to return home with self care. Pt may need neb machine if nebs are needed at DC.   Action/Plan: CM will cont to follow for DC needs.   Expected Discharge Date:     07/25/2016             Expected Discharge Plan:  Home/Self Care  In-House Referral:  NA  Discharge planning Services  CM Consult  Post Acute Care Choice:  Durable Medical Equipment Choice offered to:     DME Arranged:  Nebulizer/meds DME Agency:     Status of Service:  In process, will continue to follow  Sherald Barge, RN 07/22/2016, 1:50 PM

## 2016-07-22 NOTE — Progress Notes (Signed)
Subjective: She states that she is feeling better but continues to have wheezing and shortness of breath. She feels bloated from Solu-Medrol she states.  Objective: Vital signs in last 24 hours: Vitals:   07/21/16 2110 07/21/16 2245 07/22/16 0004 07/22/16 0520  BP:  132/69  (!) 160/81  Pulse:  62  61  Resp:  18  18  Temp:  97.9 F (36.6 C)  97.8 F (36.6 C)  TempSrc:  Oral  Oral  SpO2: 96% 95% 95% 97%  Weight:      Height:       Weight change:   Intake/Output Summary (Last 24 hours) at 07/22/16 0720 Last data filed at 07/21/16 1500  Gross per 24 hour  Intake             1005 ml  Output              900 ml  Net              105 ml    Physical Exam: Alert. No distress. Lungs reveal mild expiratory wheezes. Heart regular with no murmurs. Abdomen soft and nontender. Extremities reveal no edema.  Lab Results:    Results for orders placed or performed during the hospital encounter of 07/18/16 (from the past 24 hour(s))  Glucose, capillary     Status: Abnormal   Collection Time: 07/21/16  7:59 AM  Result Value Ref Range   Glucose-Capillary 297 (H) 65 - 99 mg/dL   Comment 1 Notify RN   Glucose, capillary     Status: Abnormal   Collection Time: 07/21/16 11:52 AM  Result Value Ref Range   Glucose-Capillary 232 (H) 65 - 99 mg/dL   Comment 1 Notify RN   Glucose, capillary     Status: Abnormal   Collection Time: 07/21/16  3:58 PM  Result Value Ref Range   Glucose-Capillary 351 (H) 65 - 99 mg/dL   Comment 1 Notify RN   Glucose, capillary     Status: Abnormal   Collection Time: 07/21/16 10:13 PM  Result Value Ref Range   Glucose-Capillary 220 (H) 65 - 99 mg/dL   Comment 1 Notify RN    Comment 2 Document in Chart      ABGS No results for input(s): PHART, PO2ART, TCO2, HCO3 in the last 72 hours.  Invalid input(s): PCO2 CULTURES No results found for this or any previous visit (from the past 240 hour(s)). Studies/Results: No results found. Micro Results: No results  found for this or any previous visit (from the past 240 hour(s)). Studies/Results: No results found. Medications:  I have reviewed the patient's current medications Scheduled Meds: . albuterol  2.5 mg Nebulization Q4H WA  . aspirin EC  81 mg Oral Q M,W,F  . azithromycin  500 mg Intravenous Q24H  . carvedilol  25 mg Oral BID WC  . dextromethorphan-guaiFENesin  1 tablet Oral BID  . enoxaparin (LOVENOX) injection  40 mg Subcutaneous Q24H  . glimepiride  2 mg Oral Daily  . insulin aspart  0-20 Units Subcutaneous TID WC  . insulin aspart  0-5 Units Subcutaneous QHS  . liothyronine  5 mcg Oral Daily  . liraglutide  1.2 mg Subcutaneous Daily  . metFORMIN  1,000 mg Oral BID WC  . methylPREDNISolone (SOLU-MEDROL) injection  80 mg Intravenous Q8H  . pantoprazole  40 mg Oral BID AC  . pravastatin  10 mg Oral QPM  . tamsulosin  0.4 mg Oral Daily   Continuous Infusions: PRN Meds:.albuterol, metoCLOPramide,  ondansetron **OR** ondansetron (ZOFRAN) IV, oxyCODONE-acetaminophen   Assessment/Plan: #1. Asthma exacerbation. Slowly improve. Modify Solu-Medrol to 80 mg every 8 hours. Continue albuterol nebulizer treatments. Continue azithromycin. Check d-dimer. #2. Diabetes. Continue current treatment and sliding scale NovoLog. Active Problems:   Asthmatic bronchitis   DM (diabetes mellitus) (HCC)   GERD (gastroesophageal reflux disease)   HLD (hyperlipidemia)   HTN (hypertension)     LOS: 4 days   Vicky Mccanless 07/22/2016, 7:20 AM

## 2016-07-22 NOTE — Progress Notes (Signed)
Inpatient Diabetes Program Recommendations  AACE/ADA: New Consensus Statement on Inpatient Glycemic Control (2015)  Target Ranges:  Prepandial:   less than 140 mg/dL      Peak postprandial:   less than 180 mg/dL (1-2 hours)      Critically ill patients:  140 - 180 mg/dL   Lab Results  Component Value Date   GLUCAP 262 (H) 07/22/2016    Review of Glycemic Control Results for Kathy Tran, Kathy Tran (MRN [id number]) as of 07/22/2016 09:38  Ref. Range 07/21/2016 07:59 07/21/2016 11:52 07/21/2016 15:58 07/21/2016 22:13 07/22/2016 07:32  Glucose-Capillary Latest Ref Range: 65 - 99 mg/dL 297 (H) 232 (H) 351 (H) 220 (H) 262 (H)   Diabetes history: DM2 Outpatient Diabetes medications: Amaryl 2 mg daily, Victoza 1.2 mg daily, Metformin XR 1000 mg BID Current orders for Inpatient glycemic control: Novolog 0-20 units TID with meals, Novolog 0-5 units QHS,Amaryl 2 mg daily, Victoza 1.2 mg daily, Metformin XR 1000 mg BID  Inpatient Diabetes Program Recommendations: Insulin - Basal: If steroids are continued, please consider ordering Lantus 13 units Q24H starting now (based on 67 kg x 0.2 units).  Noted steroids decreased.  Thank you, Kathy Tran. Kathy Lewelling, RN, MSN, CDE Inpatient Glycemic Control Team Team Pager [phone] (8am-5pm) 07/22/2016 9:39 AM

## 2016-07-23 LAB — GLUCOSE, CAPILLARY: Glucose-Capillary: 285 mg/dL — ABNORMAL HIGH (ref 65–99)

## 2016-07-23 MED ORDER — PANTOPRAZOLE SODIUM 40 MG PO TBEC: 40.0000 mg | DELAYED_RELEASE_TABLET | Freq: Two times a day (BID) | ORAL | 2 refills | Status: DC

## 2016-07-23 NOTE — Discharge Summary (Signed)
Physician Discharge Summary  Kathy Tran ZOX:[id number] DOB: [birth year] DOA: 07/18/2016   Admit date: 07/18/2016 Discharge date: 07/23/2016  Discharge Diagnoses:  Active Problems:   Asthmatic bronchitis   DM (diabetes mellitus) (HCC)   GERD (gastroesophageal reflux disease)   HLD (hyperlipidemia)   HTN (hypertension)    Wt Readings from Last 3 Encounters:  07/18/16 149 lb (67.6 kg)  11/08/15 150 lb 6.4 oz (68.2 kg)  11/07/15 149 lb (67.6 kg)     Hospital Course:  This patient is a 61 year old female who presented with coughing, wheezing and shortness of breath unresponsive to outpatient therapy. Her chest x-ray revealed no acute infiltrate.  Her CBC revealed white count of 6.6 with 86% neutrophils. Glucose was 322. HIV is negative. She was treated with albuterol nebulizer treatments, intravenous Solu-Medrol and intravenous azithromycin. She gradually improved with treatment. Her diabetes was treated with her usual regimen plus NovoLog and Lantus.  Wheezing and shortness of breath have significantly improved by 3/13. Oxygen saturations are normal on room air. Her d-dimer was normal. On exam on the day of discharge she no longer has wheezing and does not appear dyspneic. Heart rate is regular with no murmurs. Extremities reveal trace edema in the legs.  She'll be continued on albuterol nebulizers at home and will continue a prednisone taper. She will be seen in follow-up in office in one week.   Discharge Instructions  Discharge Instructions    Diet - low sodium heart healthy    Complete by:  As directed    Increase activity slowly    Complete by:  As directed      Allergies as of 07/23/2016      Reactions   Sulfa Antibiotics Anaphylaxis      Medication List    STOP taking these medications   diazepam 5 MG tablet Commonly known as:  VALIUM   metoCLOPramide 10 MG tablet Commonly known as:  REGLAN   oxyCODONE-acetaminophen 5-325 MG tablet Commonly known as:  PERCOCET      TAKE these medications   aspirin EC 81 MG tablet Take 1 tablet by mouth on Monday, Wednesday, and Friday   carvedilol 25 MG tablet Commonly known as:  COREG Take 25 mg by mouth 2 (two) times daily with a meal.   glimepiride 2 MG tablet Commonly known as:  AMARYL Take 2 mg by mouth daily.   hydrochlorothiazide 25 MG tablet Commonly known as:  HYDRODIURIL Take 25 mg by mouth daily.   liothyronine 5 MCG tablet Commonly known as:  CYTOMEL Take 5 mcg by mouth daily.   meloxicam 15 MG tablet Commonly known as:  MOBIC Take 15 mg by mouth daily as needed for pain.   metFORMIN 500 MG 24 hr tablet Commonly known as:  GLUCOPHAGE-XR Take 1,000 mg by mouth 2 (two) times daily.   metroNIDAZOLE 0.75 % gel Commonly known as:  METROGEL Apply 1 application topically daily as needed (for rosacia).   pantoprazole 40 MG tablet Commonly known as:  PROTONIX Take 1 tablet (40 mg total) by mouth 2 (two) times daily before a meal.   pravastatin 10 MG tablet Commonly known as:  PRAVACHOL Take 10 mg by mouth every evening.   predniSONE 20 MG tablet Commonly known as:  DELTASONE Take 3 tablets days 1-3, then 2 tablets days 4-8, and then 1 tablet days 9-12. Currently day 1/12.   tamsulosin 0.4 MG Caps capsule Commonly known as:  FLOMAX Take 1 capsule (0.4 mg total) by mouth daily.  traMADol 50 MG tablet Commonly known as:  ULTRAM Take 50-100 mg by mouth every 6 (six) hours as needed (for pain.). Reported on 09/28/2015   VENTOLIN HFA 108 (90 Base) MCG/ACT inhaler Generic drug:  albuterol Inhale 2 puffs into the lungs every 6 (six) hours as needed for wheezing or shortness of breath.   albuterol (2.5 MG/3ML) 0.083% nebulizer solution Commonly known as:  PROVENTIL Take 2.5 mg by nebulization every 6 (six) hours as needed.   VICTOZA 18 MG/3ML Sopn Generic drug:  liraglutide Inject 1.2 mg into the skin daily.        Kathy Tran 07/23/2016

## 2016-07-23 NOTE — Care Management Note (Signed)
Case Management Note  Patient Details  Name: Kathy Tran MRN: [id number] Date of Birth: 1956/04/05  Expected Discharge Date:  07/23/16               Expected Discharge Plan:  Home/Self Care  In-House Referral:  NA  Discharge planning Services  CM Consult  Post Acute Care Choice:  NA Choice offered to:  NA  Status of Service:  Completed, signed off  If discussed at Long Length of Stay Meetings, dates discussed: 07/23/2016   Additional Comments: Clarification: pt does not need neb mcahine, she was provided one by MD office prior to coming to hospital. Pt discharging home today with self care. No CM needs.   Sherald Barge, RN 07/23/2016, 8:57 AM

## 2016-07-30 DIAGNOSIS — E119 Type 2 diabetes mellitus without complications: Secondary | ICD-10-CM | POA: Diagnosis not present

## 2016-07-30 DIAGNOSIS — R0602 Shortness of breath: Secondary | ICD-10-CM | POA: Diagnosis not present

## 2016-07-30 DIAGNOSIS — R05 Cough: Secondary | ICD-10-CM | POA: Diagnosis not present

## 2016-08-02 DIAGNOSIS — J45909 Unspecified asthma, uncomplicated: Secondary | ICD-10-CM | POA: Diagnosis not present

## 2016-08-02 DIAGNOSIS — E119 Type 2 diabetes mellitus without complications: Secondary | ICD-10-CM | POA: Diagnosis not present

## 2016-08-15 ENCOUNTER — Encounter (INDEPENDENT_AMBULATORY_CARE_PROVIDER_SITE_OTHER): Payer: Self-pay | Admitting: *Deleted

## 2016-08-15 ENCOUNTER — Telehealth (INDEPENDENT_AMBULATORY_CARE_PROVIDER_SITE_OTHER): Payer: Self-pay | Admitting: *Deleted

## 2016-08-15 MED ORDER — PEG 3350-KCL-NA BICARB-NACL 420 G PO SOLR: 4000.0000 mL | Freq: Once | ORAL | 0 refills | Status: AC

## 2016-08-15 NOTE — Telephone Encounter (Signed)
Patient need trilyte 

## 2016-08-19 ENCOUNTER — Telehealth (INDEPENDENT_AMBULATORY_CARE_PROVIDER_SITE_OTHER): Payer: Self-pay | Admitting: *Deleted

## 2016-08-19 MED ORDER — PEG 3350-KCL-NA BICARB-NACL 420 G PO SOLR: 4000.0000 mL | Freq: Once | ORAL | 0 refills | Status: AC

## 2016-08-19 NOTE — Telephone Encounter (Signed)
Patient needs trilyte 

## 2016-08-21 ENCOUNTER — Telehealth (INDEPENDENT_AMBULATORY_CARE_PROVIDER_SITE_OTHER): Payer: Self-pay | Admitting: *Deleted

## 2016-08-21 NOTE — Telephone Encounter (Signed)
agree

## 2016-08-21 NOTE — Telephone Encounter (Signed)
Referring MD/PCP: fagan   Procedure: tcs  Reason/Indication:  Hx polyps  Has patient had this procedure before?  Yes, 2012  If so, when, by whom and where?    Is there a family history of colon cancer?  no  Who?  What age when diagnosed?    Is patient diabetic?   yes      Does patient have prosthetic heart valve or mechanical valve?  no  Do you have a pacemaker?  no  Has patient ever had endocarditis? no  Has patient had joint replacement within last 12 months?  no  Does patient tend to be constipated or take laxatives? no  Does patient have a history of alcohol/drug use?  no  Is patient on Coumadin, Plavix and/or Aspirin? no  Medications: see epic  Allergies: see epic  Medication Adjustment per Dr Laural Golden: decrease amaryl to 5 mg day before, don't take metformin day before, don't take victosa day of procedure  Procedure date & time: 09/18/16 at 830

## 2016-09-02 DIAGNOSIS — E119 Type 2 diabetes mellitus without complications: Secondary | ICD-10-CM | POA: Diagnosis not present

## 2016-09-02 DIAGNOSIS — J45909 Unspecified asthma, uncomplicated: Secondary | ICD-10-CM | POA: Diagnosis not present

## 2016-09-02 DIAGNOSIS — I10 Essential (primary) hypertension: Secondary | ICD-10-CM | POA: Diagnosis not present

## 2016-09-18 DIAGNOSIS — J45909 Unspecified asthma, uncomplicated: Secondary | ICD-10-CM | POA: Diagnosis not present

## 2016-09-18 DIAGNOSIS — E119 Type 2 diabetes mellitus without complications: Secondary | ICD-10-CM | POA: Diagnosis not present

## 2016-09-18 DIAGNOSIS — I10 Essential (primary) hypertension: Secondary | ICD-10-CM | POA: Diagnosis not present

## 2016-09-19 ENCOUNTER — Other Ambulatory Visit (HOSPITAL_COMMUNITY): Payer: Self-pay | Admitting: Pulmonary Disease

## 2016-09-19 DIAGNOSIS — R0602 Shortness of breath: Secondary | ICD-10-CM

## 2016-09-19 DIAGNOSIS — E119 Type 2 diabetes mellitus without complications: Secondary | ICD-10-CM | POA: Diagnosis not present

## 2016-09-19 DIAGNOSIS — I10 Essential (primary) hypertension: Secondary | ICD-10-CM | POA: Diagnosis not present

## 2016-09-19 DIAGNOSIS — J45909 Unspecified asthma, uncomplicated: Secondary | ICD-10-CM | POA: Diagnosis not present

## 2016-09-20 DIAGNOSIS — I10 Essential (primary) hypertension: Secondary | ICD-10-CM | POA: Diagnosis not present

## 2016-09-20 DIAGNOSIS — E119 Type 2 diabetes mellitus without complications: Secondary | ICD-10-CM | POA: Diagnosis not present

## 2016-09-20 DIAGNOSIS — J45909 Unspecified asthma, uncomplicated: Secondary | ICD-10-CM | POA: Diagnosis not present

## 2016-09-25 ENCOUNTER — Ambulatory Visit (HOSPITAL_COMMUNITY)
Admission: RE | Admit: 2016-09-25 | Discharge: 2016-09-25 | Disposition: A | Discharge status: Home / Self Care | Payer: 59 | Source: Ambulatory Visit | Attending: Pulmonary Disease | Admitting: Pulmonary Disease

## 2016-09-25 ENCOUNTER — Encounter (HOSPITAL_COMMUNITY): Payer: Self-pay

## 2016-09-25 ENCOUNTER — Inpatient Hospital Stay (HOSPITAL_COMMUNITY)
Admission: AD | Admit: 2016-09-25 | Discharge: 2016-09-30 | DRG: 203 | Disposition: A | Discharge status: Home / Self Care | Payer: 59 | Source: Ambulatory Visit | Attending: Internal Medicine | Admitting: Internal Medicine

## 2016-09-25 DIAGNOSIS — Z825 Family history of asthma and other chronic lower respiratory diseases: Secondary | ICD-10-CM

## 2016-09-25 DIAGNOSIS — Z79899 Other long term (current) drug therapy: Secondary | ICD-10-CM

## 2016-09-25 DIAGNOSIS — I10 Essential (primary) hypertension: Secondary | ICD-10-CM | POA: Diagnosis present

## 2016-09-25 DIAGNOSIS — Z882 Allergy status to sulfonamides status: Secondary | ICD-10-CM

## 2016-09-25 DIAGNOSIS — R0602 Shortness of breath: Secondary | ICD-10-CM | POA: Diagnosis not present

## 2016-09-25 DIAGNOSIS — Z87442 Personal history of urinary calculi: Secondary | ICD-10-CM | POA: Diagnosis not present

## 2016-09-25 DIAGNOSIS — E1165 Type 2 diabetes mellitus with hyperglycemia: Secondary | ICD-10-CM | POA: Diagnosis present

## 2016-09-25 DIAGNOSIS — Z7984 Long term (current) use of oral hypoglycemic drugs: Secondary | ICD-10-CM | POA: Diagnosis not present

## 2016-09-25 DIAGNOSIS — R918 Other nonspecific abnormal finding of lung field: Secondary | ICD-10-CM | POA: Diagnosis present

## 2016-09-25 DIAGNOSIS — J209 Acute bronchitis, unspecified: Secondary | ICD-10-CM | POA: Diagnosis present

## 2016-09-25 DIAGNOSIS — K219 Gastro-esophageal reflux disease without esophagitis: Secondary | ICD-10-CM | POA: Diagnosis present

## 2016-09-25 DIAGNOSIS — J45901 Unspecified asthma with (acute) exacerbation: Principal | ICD-10-CM | POA: Diagnosis present

## 2016-09-25 DIAGNOSIS — R011 Cardiac murmur, unspecified: Secondary | ICD-10-CM | POA: Diagnosis present

## 2016-09-25 DIAGNOSIS — J40 Bronchitis, not specified as acute or chronic: Secondary | ICD-10-CM | POA: Diagnosis present

## 2016-09-25 DIAGNOSIS — Z7982 Long term (current) use of aspirin: Secondary | ICD-10-CM

## 2016-09-25 DIAGNOSIS — E119 Type 2 diabetes mellitus without complications: Secondary | ICD-10-CM | POA: Diagnosis not present

## 2016-09-25 LAB — COMPREHENSIVE METABOLIC PANEL
ALT: 23 U/L (ref 14–54)
AST: 19 U/L (ref 15–41)
Albumin: 4 g/dL (ref 3.5–5.0)
Alkaline Phosphatase: 48 U/L (ref 38–126)
Anion gap: 9 (ref 5–15)
BILIRUBIN TOTAL: 0.7 mg/dL (ref 0.3–1.2)
BUN: 19 mg/dL (ref 6–20)
CALCIUM: 9.3 mg/dL (ref 8.9–10.3)
CO2: 26 mmol/L (ref 22–32)
CREATININE: 0.71 mg/dL (ref 0.44–1.00)
Chloride: 101 mmol/L (ref 101–111)
Glucose, Bld: 288 mg/dL — ABNORMAL HIGH (ref 65–99)
Potassium: 3.5 mmol/L (ref 3.5–5.1)
Sodium: 136 mmol/L (ref 135–145)
TOTAL PROTEIN: 6.5 g/dL (ref 6.5–8.1)

## 2016-09-25 LAB — GLUCOSE, RANDOM: Glucose, Bld: 460 mg/dL — ABNORMAL HIGH (ref 65–99)

## 2016-09-25 LAB — CBC WITH DIFFERENTIAL/PLATELET
BASOS ABS: 0 10*3/uL (ref 0.0–0.1)
BASOS PCT: 1 %
EOS ABS: 0.3 10*3/uL (ref 0.0–0.7)
EOS PCT: 4 %
HCT: 42.4 % (ref 36.0–46.0)
HEMOGLOBIN: 14.4 g/dL (ref 12.0–15.0)
Lymphocytes Relative: 27 %
Lymphs Abs: 1.8 10*3/uL (ref 0.7–4.0)
MCH: 32.4 pg (ref 26.0–34.0)
MCHC: 34 g/dL (ref 30.0–36.0)
MCV: 95.5 fL (ref 78.0–100.0)
Monocytes Absolute: 0.5 10*3/uL (ref 0.1–1.0)
Monocytes Relative: 7 %
NEUTROS PCT: 61 %
Neutro Abs: 4.2 10*3/uL (ref 1.7–7.7)
PLATELETS: 214 10*3/uL (ref 150–400)
RBC: 4.44 MIL/uL (ref 3.87–5.11)
RDW: 12.5 % (ref 11.5–15.5)
WBC: 6.9 10*3/uL (ref 4.0–10.5)

## 2016-09-25 LAB — BRAIN NATRIURETIC PEPTIDE: B NATRIURETIC PEPTIDE 5: 27 pg/mL (ref 0.0–100.0)

## 2016-09-25 LAB — GLUCOSE, CAPILLARY: GLUCOSE-CAPILLARY: 449 mg/dL — AB (ref 65–99)

## 2016-09-25 LAB — TSH: TSH: 2.715 u[IU]/mL (ref 0.350–4.500)

## 2016-09-25 LAB — POCT I-STAT CREATININE: CREATININE: 0.6 mg/dL (ref 0.44–1.00)

## 2016-09-25 MED ORDER — ACETAMINOPHEN 325 MG PO TABS: 650.0000 mg | ORAL_TABLET | Freq: Four times a day (QID) | ORAL | Status: DC | PRN

## 2016-09-25 MED ORDER — INSULIN ASPART 100 UNIT/ML ~~LOC~~ SOLN
10.0000 [IU] | Freq: Once | SUBCUTANEOUS | Status: AC
  Administered 2016-09-25: 10 [IU] via SUBCUTANEOUS

## 2016-09-25 MED ORDER — TRAMADOL HCL 50 MG PO TABS: 50.0000 mg | ORAL_TABLET | Freq: Four times a day (QID) | ORAL | Status: DC | PRN

## 2016-09-25 MED ORDER — ALBUTEROL SULFATE (2.5 MG/3ML) 0.083% IN NEBU
2.5000 mg | INHALATION_SOLUTION | RESPIRATORY_TRACT | Status: DC | PRN
  Administered 2016-09-25: 2.5 mg via RESPIRATORY_TRACT
  Filled 2016-09-25: qty 3

## 2016-09-25 MED ORDER — PRAVASTATIN SODIUM 10 MG PO TABS
10.0000 mg | ORAL_TABLET | Freq: Every evening | ORAL | Status: DC
  Administered 2016-09-25 – 2016-09-29 (×5): 10 mg via ORAL
  Filled 2016-09-25 (×5): qty 1

## 2016-09-25 MED ORDER — METRONIDAZOLE 0.75 % EX GEL: 1.0000 "application " | Freq: Every day | CUTANEOUS | Status: DC | PRN

## 2016-09-25 MED ORDER — ENOXAPARIN SODIUM 40 MG/0.4ML ~~LOC~~ SOLN
40.0000 mg | SUBCUTANEOUS | Status: DC
  Administered 2016-09-26 – 2016-09-29 (×4): 40 mg via SUBCUTANEOUS
  Filled 2016-09-25 (×4): qty 0.4

## 2016-09-25 MED ORDER — ONDANSETRON HCL 4 MG PO TABS: 4.0000 mg | ORAL_TABLET | Freq: Four times a day (QID) | ORAL | Status: DC | PRN

## 2016-09-25 MED ORDER — TRAMADOL HCL 50 MG PO TABS
50.0000 mg | ORAL_TABLET | Freq: Four times a day (QID) | ORAL | Status: DC | PRN
  Administered 2016-09-25: 50 mg via ORAL
  Filled 2016-09-25: qty 1

## 2016-09-25 MED ORDER — LIRAGLUTIDE 18 MG/3ML ~~LOC~~ SOPN
1.2000 mg | PEN_INJECTOR | Freq: Every day | SUBCUTANEOUS | Status: DC
Start: 2016-09-26 — End: 2016-09-30
  Administered 2016-09-27 – 2016-09-29 (×3): 1.2 mg via SUBCUTANEOUS

## 2016-09-25 MED ORDER — IPRATROPIUM BROMIDE 0.02 % IN SOLN: 0.5000 mg | Freq: Four times a day (QID) | RESPIRATORY_TRACT | Status: DC

## 2016-09-25 MED ORDER — ONDANSETRON HCL 4 MG/2ML IJ SOLN: 4.0000 mg | Freq: Four times a day (QID) | INTRAMUSCULAR | Status: DC | PRN

## 2016-09-25 MED ORDER — IPRATROPIUM-ALBUTEROL 0.5-2.5 (3) MG/3ML IN SOLN
3.0000 mL | Freq: Four times a day (QID) | RESPIRATORY_TRACT | Status: DC
  Administered 2016-09-25 – 2016-09-27 (×9): 3 mL via RESPIRATORY_TRACT
  Filled 2016-09-25 (×9): qty 3

## 2016-09-25 MED ORDER — LEVOFLOXACIN IN D5W 750 MG/150ML IV SOLN
750.0000 mg | INTRAVENOUS | Status: DC
  Administered 2016-09-25 – 2016-09-27 (×3): 750 mg via INTRAVENOUS
  Filled 2016-09-25 (×4): qty 150

## 2016-09-25 MED ORDER — LEVOFLOXACIN IN D5W 750 MG/150ML IV SOLN: 750.0000 mg | Freq: Once | INTRAVENOUS | Status: DC

## 2016-09-25 MED ORDER — SODIUM CHLORIDE 0.9 % IV SOLN
INTRAVENOUS | Status: DC
  Administered 2016-09-25 – 2016-09-27 (×3): via INTRAVENOUS

## 2016-09-25 MED ORDER — IOPAMIDOL (ISOVUE-370) INJECTION 76%
100.0000 mL | Freq: Once | INTRAVENOUS | Status: AC | PRN
  Administered 2016-09-25: 100 mL via INTRAVENOUS

## 2016-09-25 MED ORDER — TAMSULOSIN HCL 0.4 MG PO CAPS
0.4000 mg | ORAL_CAPSULE | Freq: Every day | ORAL | Status: DC
  Administered 2016-09-26 – 2016-09-28 (×2): 0.4 mg via ORAL
  Filled 2016-09-25 (×5): qty 1

## 2016-09-25 MED ORDER — ALBUTEROL SULFATE (2.5 MG/3ML) 0.083% IN NEBU: 2.5000 mg | INHALATION_SOLUTION | Freq: Four times a day (QID) | RESPIRATORY_TRACT | Status: DC

## 2016-09-25 MED ORDER — CARVEDILOL 12.5 MG PO TABS
25.0000 mg | ORAL_TABLET | Freq: Two times a day (BID) | ORAL | Status: DC
  Administered 2016-09-25 – 2016-09-30 (×10): 25 mg via ORAL
  Filled 2016-09-25 (×10): qty 2

## 2016-09-25 MED ORDER — ACETAMINOPHEN 650 MG RE SUPP: 650.0000 mg | Freq: Four times a day (QID) | RECTAL | Status: DC | PRN

## 2016-09-25 MED ORDER — LIOTHYRONINE SODIUM 5 MCG PO TABS
5.0000 ug | ORAL_TABLET | Freq: Every day | ORAL | Status: DC
  Administered 2016-09-26 – 2016-09-30 (×5): 5 ug via ORAL
  Filled 2016-09-25 (×7): qty 1

## 2016-09-25 MED ORDER — INSULIN ASPART 100 UNIT/ML ~~LOC~~ SOLN
0.0000 [IU] | Freq: Every day | SUBCUTANEOUS | Status: DC
  Administered 2016-09-26: 2 [IU] via SUBCUTANEOUS
  Administered 2016-09-29: 3 [IU] via SUBCUTANEOUS

## 2016-09-25 MED ORDER — INSULIN ASPART 100 UNIT/ML ~~LOC~~ SOLN
0.0000 [IU] | Freq: Three times a day (TID) | SUBCUTANEOUS | Status: DC
  Administered 2016-09-26 (×2): 11 [IU] via SUBCUTANEOUS
  Administered 2016-09-26: 20 [IU] via SUBCUTANEOUS
  Administered 2016-09-27: 11 [IU] via SUBCUTANEOUS
  Administered 2016-09-27: 4 [IU] via SUBCUTANEOUS
  Administered 2016-09-27: 15 [IU] via SUBCUTANEOUS
  Administered 2016-09-28: 7 [IU] via SUBCUTANEOUS
  Administered 2016-09-28: 11 [IU] via SUBCUTANEOUS
  Administered 2016-09-28: 3 [IU] via SUBCUTANEOUS
  Administered 2016-09-29: 11 [IU] via SUBCUTANEOUS
  Administered 2016-09-29: 4 [IU] via SUBCUTANEOUS
  Administered 2016-09-29: 7 [IU] via SUBCUTANEOUS
  Administered 2016-09-30: 15 [IU] via SUBCUTANEOUS

## 2016-09-25 MED ORDER — HYDROCHLOROTHIAZIDE 25 MG PO TABS
25.0000 mg | ORAL_TABLET | Freq: Every day | ORAL | Status: DC
  Administered 2016-09-26 – 2016-09-30 (×5): 25 mg via ORAL
  Filled 2016-09-25 (×6): qty 1

## 2016-09-25 MED ORDER — PANTOPRAZOLE SODIUM 40 MG PO TBEC
40.0000 mg | DELAYED_RELEASE_TABLET | Freq: Two times a day (BID) | ORAL | Status: DC
  Administered 2016-09-25 – 2016-09-30 (×10): 40 mg via ORAL
  Filled 2016-09-25 (×10): qty 1

## 2016-09-25 MED ORDER — ASPIRIN EC 81 MG PO TBEC
81.0000 mg | DELAYED_RELEASE_TABLET | Freq: Every day | ORAL | Status: DC
  Administered 2016-09-26 – 2016-09-30 (×5): 81 mg via ORAL
  Filled 2016-09-25 (×6): qty 1

## 2016-09-25 MED ORDER — GUAIFENESIN ER 600 MG PO TB12
600.0000 mg | ORAL_TABLET | Freq: Two times a day (BID) | ORAL | Status: DC
  Administered 2016-09-25 – 2016-09-30 (×10): 600 mg via ORAL
  Filled 2016-09-25 (×10): qty 1

## 2016-09-25 MED ORDER — TRAZODONE HCL 50 MG PO TABS: 50.0000 mg | ORAL_TABLET | Freq: Every evening | ORAL | Status: DC | PRN

## 2016-09-25 MED ORDER — METHYLPREDNISOLONE SODIUM SUCC 40 MG IJ SOLR
40.0000 mg | Freq: Two times a day (BID) | INTRAMUSCULAR | Status: DC
  Administered 2016-09-25: 80 mg via INTRAVENOUS
  Administered 2016-09-26 – 2016-09-30 (×8): 40 mg via INTRAVENOUS
  Filled 2016-09-25 (×10): qty 1

## 2016-09-25 NOTE — Progress Notes (Signed)
Pharmacy Antibiotic Note  Kathy Tran is a 61 y.o. female admitted on 09/25/2016 with COPD.  Pharmacy has been consulted for Levaquin dosing.  Plan: Levaquin 750mg  IV every 24 hours F/U in AM when complete admission information available.     No data recorded.   Recent Labs Lab 09/25/16 1009 09/25/16 1710  WBC  --  6.9  CREATININE 0.60 0.71    CrCl cannot be calculated (Unknown ideal weight.).    Allergies  Allergen Reactions  . Sulfa Antibiotics Anaphylaxis    Antimicrobials this admission: Levaquin 5/16 >>   Dose adjustments this admission: n/a   Microbiology results:  5/16 Sputum: pending    Thank you for allowing pharmacy to be a part of this patient's care.  Pricilla Larsson 09/25/2016 6:43 PM

## 2016-09-25 NOTE — H&P (Signed)
Full note dictated. She has asthmatic bronchitis not controlled with multiple meds.

## 2016-09-26 LAB — BRAIN NATRIURETIC PEPTIDE: B Natriuretic Peptide: 38 pg/mL (ref 0.0–100.0)

## 2016-09-26 LAB — GLUCOSE, CAPILLARY
Glucose-Capillary: 283 mg/dL — ABNORMAL HIGH (ref 65–99)
Glucose-Capillary: 302 mg/dL — ABNORMAL HIGH (ref 65–99)
Glucose-Capillary: 354 mg/dL — ABNORMAL HIGH (ref 65–99)
Glucose-Capillary: 234 mg/dL — ABNORMAL HIGH (ref 65–99)

## 2016-09-26 LAB — IGG, IGA, IGM
IGA: 94 mg/dL (ref 87–352)
IGM, SERUM: 48 mg/dL (ref 26–217)
IgG (Immunoglobin G), Serum: 421 mg/dL — ABNORMAL LOW (ref 700–1600)

## 2016-09-26 LAB — EXPECTORATED SPUTUM ASSESSMENT W REFEX TO RESP CULTURE

## 2016-09-26 LAB — EXPECTORATED SPUTUM ASSESSMENT W GRAM STAIN, RFLX TO RESP C: Special Requests: NORMAL

## 2016-09-26 MED ORDER — INSULIN GLARGINE 100 UNIT/ML ~~LOC~~ SOLN
10.0000 [IU] | Freq: Every day | SUBCUTANEOUS | Status: DC
  Administered 2016-09-26: 10 [IU] via SUBCUTANEOUS
  Filled 2016-09-26 (×2): qty 0.1

## 2016-09-26 NOTE — H&P (Signed)
[name], SANTEE NO.:  [id number]  MEDICAL RECORD NO.:  [id number]  LOCATION:                                 FACILITY:  PHYSICIAN:  Jerson Furukawa L. Luan Pulling, M.D.DATE OF BIRTH:  12-13-1955  DATE OF ADMISSION:  09/25/2016 DATE OF DISCHARGE:  LH                             HISTORY & PHYSICAL   CHIEF COMPLAINT:  Cough and congestion.  HISTORY:  This is a 61 year old who has history of diabetes, hypertension, kidney stones, reflux, asthmatic bronchitis about a year ago and who has had trouble since she had another episode of what seemed to be asthmatic bronchitis in March of this year.  She has some element of reactive airway disease, but she came in today.  She has been on multiple treatments and had increasing cough and congestion.  She was bringing up green sputum.  She was very short of breath and she was admitted to the hospital because of this.  She had CT of the chest today, which I personally reviewed and which shows that she has no evidence of pulmonary emboli and does not have pneumonia.  She had positive serology for mycoplasma and she was started on doxycycline which she says has not helped.  She has not had any chest pain.  She has been short of breath and tired.  No hemoptysis.  No night sweats.  No nausea, vomiting, diarrhea.  No urinary symptoms.  PAST MEDICAL HISTORY:  Positive for diabetes, heart murmur, hypertension, kidney stones.  Surgically, she has had C-section and colonoscopy, cystoscopy with a stent placement and extraction of stones, endometrial ablation, shoulder surgery, tonsillectomy, tubal ligation.  SOCIAL HISTORY:  She is a lifelong nonsmoker.  She does drink alcohol occasionally.  She does not use any illicit drugs.  FAMILY HISTORY:  Positive for COPD in her mother, cancer in her father.  ALLERGIES:  She is allergic to SULFA ANTIBIOTICS.  MEDICATIONS:  Reviewed and are accurate.  REVIEW OF SYSTEMS:  Except as mentioned,  10-point review of systems is negative.  PHYSICAL EXAMINATION:  GENERAL:  She is awake and alert and in moderate distress.  She is clearly wheezing, coughing, and congested.  She is coughing up some sputum here in the office.  She has some use of accessory muscles.  In mild acute distress as mentioned. EYES:  Pupils react.  EOMI. EARS, NOSE, MOUTH, AND THROAT:  Her mucous membranes are moist.  Hearing is grossly normal.  Her throat is clear. CARDIOVASCULAR:  Her heart is regular with a soft systolic heart murmur. RESPIRATORY:  Her respiratory effort is increased.  She has bilateral wheezing. GASTROINTESTINAL:  Her abdomen is soft with no masses. SKIN:  Warm and dry. MUSCULOSKELETAL:  Her strength is normal. NEUROLOGIC:  No focal abnormalities. PSYCHIATRIC:  She seems to be in mild anxiety and no evidence of depression.  ASSISTANT:  My assessment then is that she has asthmatic bronchitis which has not cleared despite multiple treatments.  She has diabetes and she will need insulin for that because she is going to be on steroids. She had a CT of the lungs today, looking for pulmonary emboli and she did not  have pulmonary emboli.  She also did not have pneumonia.  PLAN:  Plan then is to admit her for IV treatments including steroids, antibiotics, etc.  She will receive nebulizer treatments.  See if we can clear her situation for her.  She is Dr. Ria Comment primary patient and she will be admitted to Dr. Ria Comment office this afternoon, and I will be glad to consult if needed.     Kathy Tran L. Luan Pulling, M.D.   ______________________________ Jasper Loser. Luan Pulling, M.D.    [facility]/MEDQ  D:  09/25/2016  T:  09/26/2016  Job:  [id number]

## 2016-09-26 NOTE — Plan of Care (Signed)
Problem: Safety: Goal: Ability to remain free from injury will improve Outcome: Progressing Pt up ad lib in room. C/o some dyspnea with exertion but tolerating room air with O2 sats >93%. Pt educated on fall risk and verbalized understanding. Will continue to monitor pt.

## 2016-09-26 NOTE — Progress Notes (Signed)
Subjective: She was admitted yesterday with wheezing and shortness of breath. She is feeling better this morning and feels that she can take a deeper breath now. She is coughing up some green mucus. No fever.  Objective: Vital signs in last 24 hours: Vitals:   09/25/16 1934 09/26/16 0304 09/26/16 0500 09/26/16 0704  BP:    131/69  Pulse:    83  Resp: 18   20  Temp:    98.4 F (36.9 C)  TempSrc:    Oral  SpO2: 98% 95%  97%  Weight:   146 lb 8 oz (66.5 kg)   Height:       Weight change:   Intake/Output Summary (Last 24 hours) at 09/26/16 0734 Last data filed at 09/26/16 0314  Gross per 24 hour  Intake           1302.5 ml  Output              200 ml  Net           1102.5 ml    Physical Exam: Alert. Lungs reveal bilateral wheezes. Heart regular with no murmurs. Extremities reveal no edema.  Lab Results:    Results for orders placed or performed during the hospital encounter of 09/25/16 (from the past 24 hour(s))  Comprehensive metabolic panel     Status: Abnormal   Collection Time: 09/25/16  5:10 PM  Result Value Ref Range   Sodium 136 135 - 145 mmol/L   Potassium 3.5 3.5 - 5.1 mmol/L   Chloride 101 101 - 111 mmol/L   CO2 26 22 - 32 mmol/L   Glucose, Bld 288 (H) 65 - 99 mg/dL   BUN 19 6 - 20 mg/dL   Creatinine, Ser 0.71 0.44 - 1.00 mg/dL   Calcium 9.3 8.9 - 10.3 mg/dL   Total Protein 6.5 6.5 - 8.1 g/dL   Albumin 4.0 3.5 - 5.0 g/dL   AST 19 15 - 41 U/L   ALT 23 14 - 54 U/L   Alkaline Phosphatase 48 38 - 126 U/L   Total Bilirubin 0.7 0.3 - 1.2 mg/dL   GFR calc non Af Amer >60 >60 mL/min   GFR calc Af Amer >60 >60 mL/min   Anion gap 9 5 - 15  CBC WITH DIFFERENTIAL     Status: None   Collection Time: 09/25/16  5:10 PM  Result Value Ref Range   WBC 6.9 4.0 - 10.5 K/uL   RBC 4.44 3.87 - 5.11 MIL/uL   Hemoglobin 14.4 12.0 - 15.0 g/dL   HCT 42.4 36.0 - 46.0 %   MCV 95.5 78.0 - 100.0 fL   MCH 32.4 26.0 - 34.0 pg   MCHC 34.0 30.0 - 36.0 g/dL   RDW 12.5 11.5 - 15.5 %    Platelets 214 150 - 400 K/uL   Neutrophils Relative % 61 %   Neutro Abs 4.2 1.7 - 7.7 K/uL   Lymphocytes Relative 27 %   Lymphs Abs 1.8 0.7 - 4.0 K/uL   Monocytes Relative 7 %   Monocytes Absolute 0.5 0.1 - 1.0 K/uL   Eosinophils Relative 4 %   Eosinophils Absolute 0.3 0.0 - 0.7 K/uL   Basophils Relative 1 %   Basophils Absolute 0.0 0.0 - 0.1 K/uL  IgG, IgA, IgM     Status: Abnormal   Collection Time: 09/25/16  5:10 PM  Result Value Ref Range   IgG (Immunoglobin G), Serum 421 (L) 700 - 1,600 mg/dL  IgA 94 87 - 352 mg/dL   IgM, Serum 48 26 - 217 mg/dL  TSH     Status: None   Collection Time: 09/25/16  5:12 PM  Result Value Ref Range   TSH 2.715 0.350 - 4.500 uIU/mL  Brain natriuretic peptide     Status: None   Collection Time: 09/25/16  5:12 PM  Result Value Ref Range   B Natriuretic Peptide 27.0 0.0 - 100.0 pg/mL  Glucose, capillary     Status: Abnormal   Collection Time: 09/25/16 10:10 PM  Result Value Ref Range   Glucose-Capillary 449 (H) 65 - 99 mg/dL  Glucose, random     Status: Abnormal   Collection Time: 09/25/16 10:25 PM  Result Value Ref Range   Glucose, Bld 460 (H) 65 - 99 mg/dL     ABGS No results for input(s): PHART, PO2ART, TCO2, HCO3 in the last 72 hours.  Invalid input(s): PCO2 CULTURES No results found for this or any previous visit (from the past 240 hour(s)). Studies/Results: Ct Angio Chest Pe W Or Wo Contrast  Result Date: 09/25/2016 CLINICAL DATA:  Shortness of breath and cough EXAM: CT ANGIOGRAPHY CHEST WITH CONTRAST TECHNIQUE: Multidetector CT imaging of the chest was performed using the standard protocol during bolus administration of intravenous contrast. Multiplanar CT image reconstructions and MIPs were obtained to evaluate the vascular anatomy. CONTRAST:  100 mL Isovue 370 nonionic COMPARISON:  Chest CT May 16, 2005 and chest radiograph July 18, 2016 FINDINGS: Cardiovascular: There is no demonstrable pulmonary embolus. There is no thoracic  aortic aneurysm or dissection. The visualize great vessels appear normal. Pericardium is not appreciably thickened. Mediastinum/Nodes: Thyroid appears unremarkable. There is no appreciable thoracic adenopathy. Lungs/Pleura: On axial slice 40 series 7, there is an 8 x 6 mm nodular opacity abutting the pleura in the anterior segment of the right upper lobe. On axial slice 52 series 4, there is a the 4 mm nodular opacity in the anterior segment of the right upper lobe. On axial slice 61 series 7, there is a 4 mm nodular opacity in the superior segment of the right lower lobe. Lungs elsewhere are clear. No edema or consolidation. No pleural effusion or pleural thickening. Upper Abdomen: Visualized upper abdominal structures are unremarkable. Musculoskeletal: There are no blastic or lytic bone lesions. Review of the MIP images confirms the above findings. IMPRESSION: No demonstrable pulmonary embolus. No thoracic aortic aneurysm or dissection. No edema or consolidation. Nodular opacities in the right lung, largest measuring 7 mm. Non-contrast chest CT at 6-12 months is recommended. If the nodule is stable at time of repeat CT, then future CT at 18-24 months (from today's scan) is considered optional for low-risk patients, but is recommended for high-risk patients. This recommendation follows the consensus statement: Guidelines for Management of Incidental Pulmonary Nodules Detected on CT Images: From the Fleischner Society 2017; Radiology 2017; 284:228-243. No evident adenopathy. Electronically Signed   By: Lowella Grip III M.D.   On: 09/25/2016 10:44   Micro Results: No results found for this or any previous visit (from the past 240 hour(s)). Studies/Results: Ct Angio Chest Pe W Or Wo Contrast  Result Date: 09/25/2016 CLINICAL DATA:  Shortness of breath and cough EXAM: CT ANGIOGRAPHY CHEST WITH CONTRAST TECHNIQUE: Multidetector CT imaging of the chest was performed using the standard protocol during bolus  administration of intravenous contrast. Multiplanar CT image reconstructions and MIPs were obtained to evaluate the vascular anatomy. CONTRAST:  100 mL Isovue 370 nonionic COMPARISON:  Chest  CT May 16, 2005 and chest radiograph July 18, 2016 FINDINGS: Cardiovascular: There is no demonstrable pulmonary embolus. There is no thoracic aortic aneurysm or dissection. The visualize great vessels appear normal. Pericardium is not appreciably thickened. Mediastinum/Nodes: Thyroid appears unremarkable. There is no appreciable thoracic adenopathy. Lungs/Pleura: On axial slice 40 series 7, there is an 8 x 6 mm nodular opacity abutting the pleura in the anterior segment of the right upper lobe. On axial slice 52 series 4, there is a the 4 mm nodular opacity in the anterior segment of the right upper lobe. On axial slice 61 series 7, there is a 4 mm nodular opacity in the superior segment of the right lower lobe. Lungs elsewhere are clear. No edema or consolidation. No pleural effusion or pleural thickening. Upper Abdomen: Visualized upper abdominal structures are unremarkable. Musculoskeletal: There are no blastic or lytic bone lesions. Review of the MIP images confirms the above findings. IMPRESSION: No demonstrable pulmonary embolus. No thoracic aortic aneurysm or dissection. No edema or consolidation. Nodular opacities in the right lung, largest measuring 7 mm. Non-contrast chest CT at 6-12 months is recommended. If the nodule is stable at time of repeat CT, then future CT at 18-24 months (from today's scan) is considered optional for low-risk patients, but is recommended for high-risk patients. This recommendation follows the consensus statement: Guidelines for Management of Incidental Pulmonary Nodules Detected on CT Images: From the Fleischner Society 2017; Radiology 2017; 284:228-243. No evident adenopathy. Electronically Signed   By: Lowella Grip III M.D.   On: 09/25/2016 10:44   Medications:  I have reviewed  the patient's current medications Scheduled Meds: . aspirin EC  81 mg Oral Daily  . carvedilol  25 mg Oral BID WC  . enoxaparin (LOVENOX) injection  40 mg Subcutaneous Q24H  . guaiFENesin  600 mg Oral BID  . hydrochlorothiazide  25 mg Oral Daily  . insulin aspart  0-20 Units Subcutaneous TID WC  . insulin aspart  0-5 Units Subcutaneous QHS  . ipratropium-albuterol  3 mL Nebulization Q6H  . liothyronine  5 mcg Oral Daily  . liraglutide  1.2 mg Subcutaneous Daily  . methylPREDNISolone (SOLU-MEDROL) injection  40 mg Intravenous Q12H  . pantoprazole  40 mg Oral BID AC  . pravastatin  10 mg Oral QPM  . tamsulosin  0.4 mg Oral Daily   Continuous Infusions: . sodium chloride 50 mL/hr at 09/25/16 1859  . levofloxacin (LEVAQUIN) IV Stopped (09/25/16 2134)   PRN Meds:.acetaminophen **OR** acetaminophen, albuterol, metroNIDAZOLE, ondansetron **OR** ondansetron (ZOFRAN) IV, traMADol, traZODone   Assessment/Plan: #1. Asthmatic bronchitis. CT angiogram of the chest reveals no infiltrate or sign of pulmonary embolus. She has pulmonary nodules which will need follow-up in 6-12 months by CT.  Continue Levaquin, Solu-Medrol and duo nebs. #2. Diabetes. Glucoses have been elevated following Solu-Medrol use. Continue sliding scale NovoLog. Active Problems:   Bronchitis     LOS: 1 day   Salvator Seppala 09/26/2016, 7:34 AM

## 2016-09-26 NOTE — Consult Note (Signed)
Consult requested by:Dr. Willey Blade  Consult requested OIZ:[id number] bronchitis  HPI: This is a 61 year old who came to my office yesterday with cough congestion and shortness of breath. Dr. Willey Blade is her primary care physician and he was out of the office so I admitted her for him. Consultation has been requested because she's had essentially a 2-3 month history of cough congestion wheezing shortness of breath. She's had multiple evaluations including CT of the chest done yesterday looking for pulmonary emboli. She did not show pulmonary emboli but she did show some pulmonary nodules that are nonspecific. She was in the hospital earlier this year with a similar situation. She is known to have diabetes a heart murmur, hypertension and kidney stones. She's not had any hemoptysis. She's not had any chest pain. She says she feels generally full. No nausea vomiting diarrhea. No abdominal pain. No urinary symptoms. She has been on off antibiotics and prednisone and has had some trouble with her blood sugar because of that.  Past Medical History:  Diagnosis Date  . Diabetes mellitus type 2, controlled ([place])    x5 yrs  . Heart murmur   . Hypertension    x 5 yrs  . Renal disorder    renal stones     Family History  Problem Relation Age of Onset  . COPD Mother   . Cancer Father      Social History   Social History  . Marital status: Married    Spouse name: N/A  . Number of children: N/A  . Years of education: N/A   Social History Main Topics  . Smoking status: Never Smoker  . Smokeless tobacco: Never Used  . Alcohol use Yes     Comment: occ  . Drug use: No  . Sexual activity: Yes    Birth control/ protection: Surgical   Other Topics Concern  . None   Social History Narrative  . None     ROS: Except as mentioned 10 point review systems is negative    Objective: Vital signs in last 24 hours: Temp:  [98 F (36.7 C)-98.4 F (36.9 C)] 98.4 F (36.9 C) (05/17 0704) Pulse Rate:   [83-89] 83 (05/17 0704) Resp:  [18-20] 20 (05/17 0704) BP: (114-131)/(69-76) 131/69 (05/17 0704) SpO2:  [94 %-99 %] 94 % (05/17 0741) Weight:  [66.5 kg (146 lb 8 oz)-67.6 kg (149 lb)] 66.5 kg (146 lb 8 oz) (05/17 0500) Weight change:  Last BM Date: 09/25/16  Intake/Output from previous day: 05/16 0701 - 05/17 0700 In: 1302.5 [P.O.:740; I.V.:412.5; IV Piggyback:150] Out: 200 [Urine:200]  PHYSICAL EXAM Constitutional: She is awake and alert and in no acute distress. She is still coughing and wheezing. Eyes: Her pupils react. EOMI. Ears nose mouth and throat: Her mucous membranes are moist. Her hearing is grossly normal. Throat is clear. Cardiovascular: Her heart is regular with normal heart sounds. No edema. Respiratory: Her respiratory rate is normal. Respiratory effort is normal. Her lungs show some wheezing still. Gastrointestinal: Her abdomen is soft with no masses. Skin: Warm and dry. Musculoskeletal: Normal strength. Neurological: No focal abnormalities. Psychiatric: Normal mood and affect  Lab Results: Basic Metabolic Panel:  Recent Labs  09/25/16 1009 09/25/16 1710 09/25/16 2225  NA  --  136  --   K  --  3.5  --   CL  --  101  --   CO2  --  26  --   GLUCOSE  --  288* 460*  BUN  --  19  --   CREATININE 0.60 0.71  --   CALCIUM  --  9.3  --    Liver Function Tests:  Recent Labs  09/25/16 1710  AST 19  ALT 23  ALKPHOS 48  BILITOT 0.7  PROT 6.5  ALBUMIN 4.0   No results for input(s): LIPASE, AMYLASE in the last 72 hours. No results for input(s): AMMONIA in the last 72 hours. CBC:  Recent Labs  09/25/16 1710  WBC 6.9  NEUTROABS 4.2  HGB 14.4  HCT 42.4  MCV 95.5  PLT 214   Cardiac Enzymes: No results for input(s): CKTOTAL, CKMB, CKMBINDEX, TROPONINI in the last 72 hours. BNP: No results for input(s): PROBNP in the last 72 hours. D-Dimer: No results for input(s): DDIMER in the last 72 hours. CBG:  Recent Labs  09/25/16 2210  GLUCAP 449*    Hemoglobin A1C: No results for input(s): HGBA1C in the last 72 hours. Fasting Lipid Panel: No results for input(s): CHOL, HDL, LDLCALC, TRIG, CHOLHDL, LDLDIRECT in the last 72 hours. Thyroid Function Tests:  Recent Labs  09/25/16 1712  TSH 2.715   Anemia Panel: No results for input(s): VITAMINB12, FOLATE, FERRITIN, TIBC, IRON, RETICCTPCT in the last 72 hours. Coagulation: No results for input(s): LABPROT, INR in the last 72 hours. Urine Drug Screen: Drugs of Abuse  No results found for: LABOPIA, COCAINSCRNUR, LABBENZ, AMPHETMU, THCU, LABBARB  Alcohol Level: No results for input(s): ETH in the last 72 hours. Urinalysis: No results for input(s): COLORURINE, LABSPEC, PHURINE, GLUCOSEU, HGBUR, BILIRUBINUR, KETONESUR, PROTEINUR, UROBILINOGEN, NITRITE, LEUKOCYTESUR in the last 72 hours.  Invalid input(s): APPERANCEUR Misc. Labs:   ABGS: No results for input(s): PHART, PO2ART, TCO2, HCO3 in the last 72 hours.  Invalid input(s): PCO2   MICROBIOLOGY: No results found for this or any previous visit (from the past 240 hour(s)).  Studies/Results: Ct Angio Chest Pe W Or Wo Contrast  Result Date: 09/25/2016 CLINICAL DATA:  Shortness of breath and cough EXAM: CT ANGIOGRAPHY CHEST WITH CONTRAST TECHNIQUE: Multidetector CT imaging of the chest was performed using the standard protocol during bolus administration of intravenous contrast. Multiplanar CT image reconstructions and MIPs were obtained to evaluate the vascular anatomy. CONTRAST:  100 mL Isovue 370 nonionic COMPARISON:  Chest CT May 16, 2005 and chest radiograph July 18, 2016 FINDINGS: Cardiovascular: There is no demonstrable pulmonary embolus. There is no thoracic aortic aneurysm or dissection. The visualize great vessels appear normal. Pericardium is not appreciably thickened. Mediastinum/Nodes: Thyroid appears unremarkable. There is no appreciable thoracic adenopathy. Lungs/Pleura: On axial slice 40 series 7, there is an 8 x  6 mm nodular opacity abutting the pleura in the anterior segment of the right upper lobe. On axial slice 52 series 4, there is a the 4 mm nodular opacity in the anterior segment of the right upper lobe. On axial slice 61 series 7, there is a 4 mm nodular opacity in the superior segment of the right lower lobe. Lungs elsewhere are clear. No edema or consolidation. No pleural effusion or pleural thickening. Upper Abdomen: Visualized upper abdominal structures are unremarkable. Musculoskeletal: There are no blastic or lytic bone lesions. Review of the MIP images confirms the above findings. IMPRESSION: No demonstrable pulmonary embolus. No thoracic aortic aneurysm or dissection. No edema or consolidation. Nodular opacities in the right lung, largest measuring 7 mm. Non-contrast chest CT at 6-12 months is recommended. If the nodule is stable at time of repeat CT, then future CT at 18-24 months (from today's scan) is  considered optional for low-risk patients, but is recommended for high-risk patients. This recommendation follows the consensus statement: Guidelines for Management of Incidental Pulmonary Nodules Detected on CT Images: From the Fleischner Society 2017; Radiology 2017; 284:228-243. No evident adenopathy. Electronically Signed   By: Lowella Grip III M.D.   On: 09/25/2016 10:44    Medications:  Prior to Admission:  Prescriptions Prior to Admission  Medication Sig Dispense Refill Last Dose  . albuterol (PROVENTIL) (2.5 MG/3ML) 0.083% nebulizer solution Take 2.5 mg by nebulization every 6 (six) hours as needed.   09/25/2016 at Unknown time  . aspirin EC 81 MG tablet Take 1 tablet by mouth on Monday, Wednesday, and Friday   Past Week at Unknown time  . carvedilol (COREG) 25 MG tablet Take 25 mg by mouth 2 (two) times daily with a meal.   09/25/2016 at 1800  . cetirizine (ZYRTEC) 10 MG tablet Take 10 mg by mouth at bedtime.   09/24/2016 at Unknown time  . doxycycline (VIBRA-TABS) 100 MG tablet Take 1  tablet by mouth 2 (two) times daily.   09/25/2016 at Unknown time  . glimepiride (AMARYL) 2 MG tablet Take 2 mg by mouth daily.   09/25/2016 at Unknown time  . hydrochlorothiazide (HYDRODIURIL) 25 MG tablet Take 25 mg by mouth daily.    09/25/2016 at Unknown time  . liothyronine (CYTOMEL) 5 MCG tablet Take 5 mcg by mouth daily.   09/25/2016 at Unknown time  . meloxicam (MOBIC) 15 MG tablet Take 15 mg by mouth daily as needed for pain.    09/24/2016 at Unknown time  . metFORMIN (GLUCOPHAGE-XR) 500 MG 24 hr tablet Take 1,000 mg by mouth 2 (two) times daily.   09/25/2016 at Unknown time  . nystatin (MYCOSTATIN) 100000 UNIT/ML suspension Take 5 mLs by mouth 4 (four) times daily.    09/25/2016 at Unknown time  . pantoprazole (PROTONIX) 40 MG tablet Take 1 tablet (40 mg total) by mouth 2 (two) times daily before a meal. 30 tablet 2 09/25/2016 at Unknown time  . pravastatin (PRAVACHOL) 10 MG tablet Take 10 mg by mouth every evening.   09/24/2016 at Unknown time  . traMADol (ULTRAM) 50 MG tablet Take 50-100 mg by mouth every 6 (six) hours as needed (for pain.). Reported on 09/28/2015   unknown  . VENTOLIN HFA 108 (90 Base) MCG/ACT inhaler Inhale 2 puffs into the lungs every 6 (six) hours as needed for wheezing or shortness of breath.    unknown  . VICTOZA 18 MG/3ML SOPN Inject 1.2 mg into the skin at bedtime.    09/24/2016 at Unknown time   Scheduled: . aspirin EC  81 mg Oral Daily  . carvedilol  25 mg Oral BID WC  . enoxaparin (LOVENOX) injection  40 mg Subcutaneous Q24H  . guaiFENesin  600 mg Oral BID  . hydrochlorothiazide  25 mg Oral Daily  . insulin aspart  0-20 Units Subcutaneous TID WC  . insulin aspart  0-5 Units Subcutaneous QHS  . ipratropium-albuterol  3 mL Nebulization Q6H  . liothyronine  5 mcg Oral Daily  . liraglutide  1.2 mg Subcutaneous Daily  . methylPREDNISolone (SOLU-MEDROL) injection  40 mg Intravenous Q12H  . pantoprazole  40 mg Oral BID AC  . pravastatin  10 mg Oral QPM  . tamsulosin   0.4 mg Oral Daily   Continuous: . sodium chloride 50 mL/hr at 09/25/16 1859  . levofloxacin (LEVAQUIN) IV Stopped (09/25/16 2134)   KGM:[id number] **OR** acetaminophen, albuterol, metroNIDAZOLE, ondansetron **OR**  ondansetron (ZOFRAN) IV, traMADol, traZODone  Assesment: She was admitted with asthmatic bronchitis. This morning she says she feels better and has lost weight compared to her admission weight. I suppose there might be some element of heart failure. Active Problems:   Bronchitis    Plan: Continue current treatments. No change in medications. Check BNP.    LOS: 1 day   Kathy Tran L 09/26/2016, 7:57 AM

## 2016-09-26 NOTE — Progress Notes (Signed)
Inpatient Diabetes Program Recommendations  AACE/ADA: New Consensus Statement on Inpatient Glycemic Control (2015)  Target Ranges:  Prepandial:   less than 140 mg/dL      Peak postprandial:   less than 180 mg/dL (1-2 hours)      Critically ill patients:  140 - 180 mg/dL  Results for SAMIYA, MERVIN (MRN [id number]) as of 09/26/2016 08:00  Ref. Range 09/25/2016 17:10 09/25/2016 22:25  Glucose Latest Ref Range: 65 - 99 mg/dL 288 (H) 460 (H)   Results for CABRINI, RUGGIERI (MRN [id number]) as of 09/26/2016 08:00  Ref. Range 09/25/2016 22:10  Glucose-Capillary Latest Ref Range: 65 - 99 mg/dL 449 (H)   Review of Glycemic Control  Diabetes history: DM2 Outpatient Diabetes medications: Amaryl 2 mg daily, Metformin XR 1000 mg BID, Victoza 1.2 mg QHS Current orders for Inpatient glycemic control: Novolog 0-20 units TID with meals, Novolog 0-5 QHS, Victoza 1.2 mg daily  Inpatient Diabetes Program Recommendations: Insulin - Basal: If steroids are continued, please consider ordering Lantus 10 units Q24H starting now. Please note that if Lantus is ordered as requested, it will likely need to be adjusted as steroids are tapered.  Thanks, Barnie Alderman, RN, MSN, CDE Diabetes Coordinator Inpatient Diabetes Program [phone] (Team Pager from 8am to 5pm)

## 2016-09-26 NOTE — Care Management Note (Signed)
Case Management Note  Patient Details  Name: Kathy Tran MRN: [id number] Date of Birth: 1956-01-24  Subjective/Objective:                  Pt admitted with bronchitis. Pt from home, ind with ADL's. She has PCP, transportation and insurance with drug coverage. Pt has no HH needs. She has neb pta.   Action/Plan: Pt plans to return home with self care. No CM needs anticipated. CM will follow to DC.  Expected Discharge Date:       09/27/2016           Expected Discharge Plan:  Home/Self Care  In-House Referral:  NA  Discharge planning Services  NA  Post Acute Care Choice:  NA Choice offered to:  NA  Status of Service:  Completed, signed off  Sherald Barge, RN 09/26/2016, 9:04 AM

## 2016-09-27 LAB — GLUCOSE, CAPILLARY
Glucose-Capillary: 273 mg/dL — ABNORMAL HIGH (ref 65–99)
Glucose-Capillary: 317 mg/dL — ABNORMAL HIGH (ref 65–99)
Glucose-Capillary: 169 mg/dL — ABNORMAL HIGH (ref 65–99)
Glucose-Capillary: 134 mg/dL — ABNORMAL HIGH (ref 65–99)

## 2016-09-27 LAB — BASIC METABOLIC PANEL
ANION GAP: 7 (ref 5–15)
BUN: 20 mg/dL (ref 6–20)
CALCIUM: 9.1 mg/dL (ref 8.9–10.3)
CO2: 25 mmol/L (ref 22–32)
Chloride: 104 mmol/L (ref 101–111)
Creatinine, Ser: 0.59 mg/dL (ref 0.44–1.00)
GFR calc Af Amer: >60 mL/min (ref >60)
GLUCOSE: 304 mg/dL — AB (ref 65–99)
Potassium: 3.9 mmol/L (ref 3.5–5.1)
SODIUM: 136 mmol/L (ref 135–145)

## 2016-09-27 MED ORDER — IPRATROPIUM-ALBUTEROL 0.5-2.5 (3) MG/3ML IN SOLN
3.0000 mL | Freq: Three times a day (TID) | RESPIRATORY_TRACT | Status: DC
  Administered 2016-09-28 – 2016-09-30 (×7): 3 mL via RESPIRATORY_TRACT
  Filled 2016-09-27 (×6): qty 3

## 2016-09-27 MED ORDER — IMMUNE GLOBULIN (HUMAN) 5 GM/50ML IV SOLN
400.0000 mg/kg | Freq: Once | INTRAVENOUS | Status: AC
  Administered 2016-09-27: 25 g via INTRAVENOUS
  Filled 2016-09-27 (×2): qty 50

## 2016-09-27 MED ORDER — GLIMEPIRIDE 2 MG PO TABS
2.0000 mg | ORAL_TABLET | Freq: Every day | ORAL | Status: DC
  Administered 2016-09-27 – 2016-09-30 (×4): 2 mg via ORAL
  Filled 2016-09-27 (×4): qty 1

## 2016-09-27 MED ORDER — IMMUNE GLOBULIN (HUMAN) 5 GM/50ML IV SOLN: 2.0000 g/kg | INTRAVENOUS | Status: DC

## 2016-09-27 MED ORDER — INSULIN ASPART 100 UNIT/ML ~~LOC~~ SOLN
5.0000 [IU] | Freq: Three times a day (TID) | SUBCUTANEOUS | Status: DC
  Administered 2016-09-27 – 2016-09-30 (×10): 5 [IU] via SUBCUTANEOUS

## 2016-09-27 MED ORDER — METFORMIN HCL ER 500 MG PO TB24
1000.0000 mg | ORAL_TABLET | Freq: Two times a day (BID) | ORAL | Status: DC
  Administered 2016-09-27 – 2016-09-30 (×7): 1000 mg via ORAL
  Filled 2016-09-27 (×7): qty 2

## 2016-09-27 MED ORDER — INSULIN GLARGINE 100 UNIT/ML ~~LOC~~ SOLN
15.0000 [IU] | Freq: Every day | SUBCUTANEOUS | Status: DC
  Administered 2016-09-27 – 2016-09-30 (×4): 15 [IU] via SUBCUTANEOUS
  Filled 2016-09-27 (×5): qty 0.15

## 2016-09-27 NOTE — Progress Notes (Addendum)
Subjective: She was admitted with asthmatic bronchitis. She's doing better. She feels like her wheezing is better. She is still coughing up a lot of sputum. No other new complaints.  Objective: Vital signs in last 24 hours: Temp:  [97.6 F (36.4 C)-97.8 F (36.6 C)] 97.6 F (36.4 C) (05/18 0544) Pulse Rate:  [83-106] 83 (05/18 0544) Resp:  [18-20] 18 (05/18 0544) BP: (125-140)/(72-76) 125/76 (05/18 0544) SpO2:  [95 %-97 %] 97 % (05/18 0544) Weight:  [66.6 kg (146 lb 12.8 oz)] 66.6 kg (146 lb 12.8 oz) (05/18 0544) Weight change: -0.998 kg (-2 lb 3.2 oz) Last BM Date: 09/26/16  Intake/Output from previous day: 05/17 0701 - 05/18 0700 In: 2058.3 [P.O.:720; I.V.:1338.3] Out: 1200 [Urine:1200]  PHYSICAL EXAM General appearance: alert, cooperative and no distress Resp: rhonchi bilaterally and wheezes bilaterally Cardio: regular rate and rhythm, S1, S2 normal, no murmur, click, rub or gallop GI: soft, non-tender; bowel sounds normal; no masses,  no organomegaly Extremities: extremities normal, atraumatic, no cyanosis or edema Skin warm and dry  Lab Results:  Results for orders placed or performed during the hospital encounter of 09/25/16 (from the past 48 hour(s))  Comprehensive metabolic panel     Status: Abnormal   Collection Time: 09/25/16  5:10 PM  Result Value Ref Range   Sodium 136 135 - 145 mmol/L   Potassium 3.5 3.5 - 5.1 mmol/L   Chloride 101 101 - 111 mmol/L   CO2 26 22 - 32 mmol/L   Glucose, Bld 288 (H) 65 - 99 mg/dL   BUN 19 6 - 20 mg/dL   Creatinine, Ser 0.71 0.44 - 1.00 mg/dL   Calcium 9.3 8.9 - 10.3 mg/dL   Total Protein 6.5 6.5 - 8.1 g/dL   Albumin 4.0 3.5 - 5.0 g/dL   AST 19 15 - 41 U/L   ALT 23 14 - 54 U/L   Alkaline Phosphatase 48 38 - 126 U/L   Total Bilirubin 0.7 0.3 - 1.2 mg/dL   GFR calc non Af Amer >60 >60 mL/min   GFR calc Af Amer >60 >60 mL/min    Comment: (NOTE) The eGFR has been calculated using the CKD EPI equation. This calculation has not  been validated in all clinical situations. eGFR's persistently <60 mL/min signify possible Chronic Kidney Disease.    Anion gap 9 5 - 15  CBC WITH DIFFERENTIAL     Status: None   Collection Time: 09/25/16  5:10 PM  Result Value Ref Range   WBC 6.9 4.0 - 10.5 K/uL   RBC 4.44 3.87 - 5.11 MIL/uL   Hemoglobin 14.4 12.0 - 15.0 g/dL   HCT 42.4 36.0 - 46.0 %   MCV 95.5 78.0 - 100.0 fL   MCH 32.4 26.0 - 34.0 pg   MCHC 34.0 30.0 - 36.0 g/dL   RDW 12.5 11.5 - 15.5 %   Platelets 214 150 - 400 K/uL   Neutrophils Relative % 61 %   Neutro Abs 4.2 1.7 - 7.7 K/uL   Lymphocytes Relative 27 %   Lymphs Abs 1.8 0.7 - 4.0 K/uL   Monocytes Relative 7 %   Monocytes Absolute 0.5 0.1 - 1.0 K/uL   Eosinophils Relative 4 %   Eosinophils Absolute 0.3 0.0 - 0.7 K/uL   Basophils Relative 1 %   Basophils Absolute 0.0 0.0 - 0.1 K/uL  IgG, IgA, IgM     Status: Abnormal   Collection Time: 09/25/16  5:10 PM  Result Value Ref Range   IgG (  Immunoglobin G), Serum 421 (L) 700 - 1,600 mg/dL   IgA 94 87 - 352 mg/dL   IgM, Serum 48 26 - 217 mg/dL    Comment: (NOTE) Performed At: [facility] [place], [place] [id number] Lindon Romp MD WU:[id number]   TSH     Status: None   Collection Time: 09/25/16  5:12 PM  Result Value Ref Range   TSH 2.715 0.350 - 4.500 uIU/mL    Comment: Performed by a 3rd Generation assay with a functional sensitivity of <=0.01 uIU/mL.  Brain natriuretic peptide     Status: None   Collection Time: 09/25/16  5:12 PM  Result Value Ref Range   B Natriuretic Peptide 27.0 0.0 - 100.0 pg/mL  Glucose, capillary     Status: Abnormal   Collection Time: 09/25/16 10:10 PM  Result Value Ref Range   Glucose-Capillary 449 (H) 65 - 99 mg/dL  Glucose, random     Status: Abnormal   Collection Time: 09/25/16 10:25 PM  Result Value Ref Range   Glucose, Bld 460 (H) 65 - 99 mg/dL  Culture, sputum-assessment     Status: None   Collection Time: 09/26/16  5:30 AM  Result Value  Ref Range   Specimen Description SPUTUM    Special Requests Normal    Sputum evaluation THIS SPECIMEN IS ACCEPTABLE FOR SPUTUM CULTURE    Report Status 09/26/2016 FINAL   Culture, respiratory (NON-Expectorated)     Status: None (Preliminary result)   Collection Time: 09/26/16  5:30 AM  Result Value Ref Range   Specimen Description SPUTUM    Special Requests Normal Reflexed from [id number]    Gram Stain      FEW WBC PRESENT, PREDOMINANTLY PMN FEW GRAM POSITIVE COCCI RARE GRAM POSITIVE RODS RARE GRAM NEGATIVE RODS Performed at [facility], [place] [address] [place], [place] [address]    Culture PENDING    Report Status PENDING   Glucose, capillary     Status: Abnormal   Collection Time: 09/26/16  7:34 AM  Result Value Ref Range   Glucose-Capillary 283 (H) 65 - 99 mg/dL   Comment 1 Notify RN    Comment 2 Document in Chart   Brain natriuretic peptide     Status: None   Collection Time: 09/26/16  8:18 AM  Result Value Ref Range   B Natriuretic Peptide 38.0 0.0 - 100.0 pg/mL  Glucose, capillary     Status: Abnormal   Collection Time: 09/26/16 11:11 AM  Result Value Ref Range   Glucose-Capillary 302 (H) 65 - 99 mg/dL   Comment 1 Notify RN    Comment 2 Document in Chart   Glucose, capillary     Status: Abnormal   Collection Time: 09/26/16  4:00 PM  Result Value Ref Range   Glucose-Capillary 354 (H) 65 - 99 mg/dL   Comment 1 Notify RN    Comment 2 Document in Chart   Glucose, capillary     Status: Abnormal   Collection Time: 09/26/16  8:24 PM  Result Value Ref Range   Glucose-Capillary 234 (H) 65 - 99 mg/dL   Comment 1 Notify RN    Comment 2 Document in Chart   Basic metabolic panel     Status: Abnormal   Collection Time: 09/27/16  4:40 AM  Result Value Ref Range   Sodium 136 135 - 145 mmol/L   Potassium 3.9 3.5 - 5.1 mmol/L   Chloride 104 101 - 111 mmol/L   CO2 25  22 - 32 mmol/L   Glucose, Bld 304 (H) 65 - 99 mg/dL   BUN 20 6 - 20 mg/dL   Creatinine, Ser 0.59 0.44 -  1.00 mg/dL   Calcium 9.1 8.9 - 10.3 mg/dL   GFR calc non Af Amer >60 >60 mL/min   GFR calc Af Amer >60 >60 mL/min    Comment: (NOTE) The eGFR has been calculated using the CKD EPI equation. This calculation has not been validated in all clinical situations. eGFR's persistently <60 mL/min signify possible Chronic Kidney Disease.    Anion gap 7 5 - 15  Glucose, capillary     Status: Abnormal   Collection Time: 09/27/16  7:52 AM  Result Value Ref Range   Glucose-Capillary 273 (H) 65 - 99 mg/dL    ABGS No results for input(s): PHART, PO2ART, TCO2, HCO3 in the last 72 hours.  Invalid input(s): PCO2 CULTURES Recent Results (from the past 240 hour(s))  Culture, sputum-assessment     Status: None   Collection Time: 09/26/16  5:30 AM  Result Value Ref Range Status   Specimen Description SPUTUM  Final   Special Requests Normal  Final   Sputum evaluation THIS SPECIMEN IS ACCEPTABLE FOR SPUTUM CULTURE  Final   Report Status 09/26/2016 FINAL  Final  Culture, respiratory (NON-Expectorated)     Status: None (Preliminary result)   Collection Time: 09/26/16  5:30 AM  Result Value Ref Range Status   Specimen Description SPUTUM  Final   Special Requests Normal Reflexed from [id number]  Final   Gram Stain   Final    FEW WBC PRESENT, PREDOMINANTLY PMN FEW GRAM POSITIVE COCCI RARE GRAM POSITIVE RODS RARE GRAM NEGATIVE RODS Performed at [facility], [address], [place], [place] [address]    Culture PENDING  Incomplete   Report Status PENDING  Incomplete   Studies/Results: Ct Angio Chest Pe W Or Wo Contrast  Result Date: 09/25/2016 CLINICAL DATA:  Shortness of breath and cough EXAM: CT ANGIOGRAPHY CHEST WITH CONTRAST TECHNIQUE: Multidetector CT imaging of the chest was performed using the standard protocol during bolus administration of intravenous contrast. Multiplanar CT image reconstructions and MIPs were obtained to evaluate the vascular anatomy. CONTRAST:  100 mL Isovue 370  nonionic COMPARISON:  Chest CT May 16, 2005 and chest radiograph July 18, 2016 FINDINGS: Cardiovascular: There is no demonstrable pulmonary embolus. There is no thoracic aortic aneurysm or dissection. The visualize great vessels appear normal. Pericardium is not appreciably thickened. Mediastinum/Nodes: Thyroid appears unremarkable. There is no appreciable thoracic adenopathy. Lungs/Pleura: On axial slice 40 series 7, there is an 8 x 6 mm nodular opacity abutting the pleura in the anterior segment of the right upper lobe. On axial slice 52 series 4, there is a the 4 mm nodular opacity in the anterior segment of the right upper lobe. On axial slice 61 series 7, there is a 4 mm nodular opacity in the superior segment of the right lower lobe. Lungs elsewhere are clear. No edema or consolidation. No pleural effusion or pleural thickening. Upper Abdomen: Visualized upper abdominal structures are unremarkable. Musculoskeletal: There are no blastic or lytic bone lesions. Review of the MIP images confirms the above findings. IMPRESSION: No demonstrable pulmonary embolus. No thoracic aortic aneurysm or dissection. No edema or consolidation. Nodular opacities in the right lung, largest measuring 7 mm. Non-contrast chest CT at 6-12 months is recommended. If the nodule is stable at time of repeat CT, then future CT at 18-24 months (from today's  scan) is considered optional for low-risk patients, but is recommended for high-risk patients. This recommendation follows the consensus statement: Guidelines for Management of Incidental Pulmonary Nodules Detected on CT Images: From the Fleischner Society 2017; Radiology 2017; 284:228-243. No evident adenopathy. Electronically Signed   By: Lowella Grip III M.D.   On: 09/25/2016 10:44    Medications:  Prior to Admission:  Prescriptions Prior to Admission  Medication Sig Dispense Refill Last Dose  . albuterol (PROVENTIL) (2.5 MG/3ML) 0.083% nebulizer solution Take 2.5 mg by  nebulization every 6 (six) hours as needed.   09/25/2016 at Unknown time  . aspirin EC 81 MG tablet Take 1 tablet by mouth on Monday, Wednesday, and Friday   Past Week at Unknown time  . carvedilol (COREG) 25 MG tablet Take 25 mg by mouth 2 (two) times daily with a meal.   09/25/2016 at 1800  . cetirizine (ZYRTEC) 10 MG tablet Take 10 mg by mouth at bedtime.   09/24/2016 at Unknown time  . doxycycline (VIBRA-TABS) 100 MG tablet Take 1 tablet by mouth 2 (two) times daily.   09/25/2016 at Unknown time  . glimepiride (AMARYL) 2 MG tablet Take 2 mg by mouth daily.   09/25/2016 at Unknown time  . hydrochlorothiazide (HYDRODIURIL) 25 MG tablet Take 25 mg by mouth daily.    09/25/2016 at Unknown time  . liothyronine (CYTOMEL) 5 MCG tablet Take 5 mcg by mouth daily.   09/25/2016 at Unknown time  . meloxicam (MOBIC) 15 MG tablet Take 15 mg by mouth daily as needed for pain.    09/24/2016 at Unknown time  . metFORMIN (GLUCOPHAGE-XR) 500 MG 24 hr tablet Take 1,000 mg by mouth 2 (two) times daily.   09/25/2016 at Unknown time  . nystatin (MYCOSTATIN) 100000 UNIT/ML suspension Take 5 mLs by mouth 4 (four) times daily.    09/25/2016 at Unknown time  . pantoprazole (PROTONIX) 40 MG tablet Take 1 tablet (40 mg total) by mouth 2 (two) times daily before a meal. 30 tablet 2 09/25/2016 at Unknown time  . pravastatin (PRAVACHOL) 10 MG tablet Take 10 mg by mouth every evening.   09/24/2016 at Unknown time  . traMADol (ULTRAM) 50 MG tablet Take 50-100 mg by mouth every 6 (six) hours as needed (for pain.). Reported on 09/28/2015   unknown  . VENTOLIN HFA 108 (90 Base) MCG/ACT inhaler Inhale 2 puffs into the lungs every 6 (six) hours as needed for wheezing or shortness of breath.    unknown  . VICTOZA 18 MG/3ML SOPN Inject 1.2 mg into the skin at bedtime.    09/24/2016 at Unknown time   Scheduled: . aspirin EC  81 mg Oral Daily  . carvedilol  25 mg Oral BID WC  . enoxaparin (LOVENOX) injection  40 mg Subcutaneous Q24H  . glimepiride   2 mg Oral Q breakfast  . guaiFENesin  600 mg Oral BID  . hydrochlorothiazide  25 mg Oral Daily  . insulin aspart  0-20 Units Subcutaneous TID WC  . insulin aspart  0-5 Units Subcutaneous QHS  . insulin aspart  5 Units Subcutaneous TID WC  . insulin glargine  15 Units Subcutaneous Daily  . ipratropium-albuterol  3 mL Nebulization Q6H  . liothyronine  5 mcg Oral Daily  . liraglutide  1.2 mg Subcutaneous Daily  . metFORMIN  1,000 mg Oral BID WC  . methylPREDNISolone (SOLU-MEDROL) injection  40 mg Intravenous Q12H  . pantoprazole  40 mg Oral BID AC  . pravastatin  10  mg Oral QPM  . tamsulosin  0.4 mg Oral Daily   Continuous: . sodium chloride 50 mL/hr at 09/26/16 2107  . levofloxacin [facility]) IV Stopped (09/26/16 2021)   MDY:[id number] **OR** acetaminophen, albuterol, metroNIDAZOLE, ondansetron **OR** ondansetron (ZOFRAN) IV, traMADol, traZODone  Assesment: She was admitted with asthmatic bronchitis. She is substantially better but still wheezing and still having rhonchi. 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

## 2016-09-27 NOTE — Progress Notes (Signed)
Subjective: She feels improvement in her wheezing. She continues to cough up mucus. She has had no fever.  Objective: Vital signs in last 24 hours: Vitals:   09/26/16 1948 09/26/16 2116 09/27/16 0256 09/27/16 0544  BP:  128/72  125/76  Pulse:  92 (!) 106 83  Resp:  20 18 18   Temp:  97.7 F (36.5 C)  97.6 F (36.4 C)  TempSrc:  Oral  Oral  SpO2: 96% 97% 97% 97%  Weight:    146 lb 12.8 oz (66.6 kg)  Height:       Weight change: -2 lb 3.2 oz (-0.998 kg)  Intake/Output Summary (Last 24 hours) at 09/27/16 0749 Last data filed at 09/27/16 0600  Gross per 24 hour  Intake          2058.33 ml  Output             1200 ml  Net           858.33 ml    Physical Exam: She has a very congested sounding cough. Lungs reveal bilateral wheezes which have improved however. Heart regular with no murmurs. Extremities reveal no edema.  Lab Results:    Results for orders placed or performed during the hospital encounter of 09/25/16 (from the past 24 hour(s))  Brain natriuretic peptide     Status: None   Collection Time: 09/26/16  8:18 AM  Result Value Ref Range   B Natriuretic Peptide 38.0 0.0 - 100.0 pg/mL  Glucose, capillary     Status: Abnormal   Collection Time: 09/26/16 11:11 AM  Result Value Ref Range   Glucose-Capillary 302 (H) 65 - 99 mg/dL   Comment 1 Notify RN    Comment 2 Document in Chart   Glucose, capillary     Status: Abnormal   Collection Time: 09/26/16  4:00 PM  Result Value Ref Range   Glucose-Capillary 354 (H) 65 - 99 mg/dL   Comment 1 Notify RN    Comment 2 Document in Chart   Glucose, capillary     Status: Abnormal   Collection Time: 09/26/16  8:24 PM  Result Value Ref Range   Glucose-Capillary 234 (H) 65 - 99 mg/dL   Comment 1 Notify RN    Comment 2 Document in Chart   Basic metabolic panel     Status: Abnormal   Collection Time: 09/27/16  4:40 AM  Result Value Ref Range   Sodium 136 135 - 145 mmol/L   Potassium 3.9 3.5 - 5.1 mmol/L   Chloride 104 101 - 111  mmol/L   CO2 25 22 - 32 mmol/L   Glucose, Bld 304 (H) 65 - 99 mg/dL   BUN 20 6 - 20 mg/dL   Creatinine, Ser 0.59 0.44 - 1.00 mg/dL   Calcium 9.1 8.9 - 10.3 mg/dL   GFR calc non Af Amer >60 >60 mL/min   GFR calc Af Amer >60 >60 mL/min   Anion gap 7 5 - 15     ABGS No results for input(s): PHART, PO2ART, TCO2, HCO3 in the last 72 hours.  Invalid input(s): PCO2 CULTURES Recent Results (from the past 240 hour(s))  Culture, sputum-assessment     Status: None   Collection Time: 09/26/16  5:30 AM  Result Value Ref Range Status   Specimen Description SPUTUM  Final   Special Requests Normal  Final   Sputum evaluation THIS SPECIMEN IS ACCEPTABLE FOR SPUTUM CULTURE  Final   Report Status 09/26/2016 FINAL  Final  Culture,  respiratory (NON-Expectorated)     Status: None (Preliminary result)   Collection Time: 09/26/16  5:30 AM  Result Value Ref Range Status   Specimen Description SPUTUM  Final   Special Requests Normal Reflexed from [id number]  Final   Gram Stain   Final    FEW WBC PRESENT, PREDOMINANTLY PMN FEW GRAM POSITIVE COCCI RARE GRAM POSITIVE RODS RARE GRAM NEGATIVE RODS Performed at [facility], [place] [address], [place], [place] [address]    Culture PENDING  Incomplete   Report Status PENDING  Incomplete   Studies/Results: Ct Angio Chest Pe W Or Wo Contrast  Result Date: 09/25/2016 CLINICAL DATA:  Shortness of breath and cough EXAM: CT ANGIOGRAPHY CHEST WITH CONTRAST TECHNIQUE: Multidetector CT imaging of the chest was performed using the standard protocol during bolus administration of intravenous contrast. Multiplanar CT image reconstructions and MIPs were obtained to evaluate the vascular anatomy. CONTRAST:  100 mL Isovue 370 nonionic COMPARISON:  Chest CT May 16, 2005 and chest radiograph July 18, 2016 FINDINGS: Cardiovascular: There is no demonstrable pulmonary embolus. There is no thoracic aortic aneurysm or dissection. The visualize great vessels appear normal.  Pericardium is not appreciably thickened. Mediastinum/Nodes: Thyroid appears unremarkable. There is no appreciable thoracic adenopathy. Lungs/Pleura: On axial slice 40 series 7, there is an 8 x 6 mm nodular opacity abutting the pleura in the anterior segment of the right upper lobe. On axial slice 52 series 4, there is a the 4 mm nodular opacity in the anterior segment of the right upper lobe. On axial slice 61 series 7, there is a 4 mm nodular opacity in the superior segment of the right lower lobe. Lungs elsewhere are clear. No edema or consolidation. No pleural effusion or pleural thickening. Upper Abdomen: Visualized upper abdominal structures are unremarkable. Musculoskeletal: There are no blastic or lytic bone lesions. Review of the MIP images confirms the above findings. IMPRESSION: No demonstrable pulmonary embolus. No thoracic aortic aneurysm or dissection. No edema or consolidation. Nodular opacities in the right lung, largest measuring 7 mm. Non-contrast chest CT at 6-12 months is recommended. If the nodule is stable at time of repeat CT, then future CT at 18-24 months (from today's scan) is considered optional for low-risk patients, but is recommended for high-risk patients. This recommendation follows the consensus statement: Guidelines for Management of Incidental Pulmonary Nodules Detected on CT Images: From the Fleischner Society 2017; Radiology 2017; 284:228-243. No evident adenopathy. Electronically Signed   By: Lowella Grip III M.D.   On: 09/25/2016 10:44   Micro Results: Recent Results (from the past 240 hour(s))  Culture, sputum-assessment     Status: None   Collection Time: 09/26/16  5:30 AM  Result Value Ref Range Status   Specimen Description SPUTUM  Final   Special Requests Normal  Final   Sputum evaluation THIS SPECIMEN IS ACCEPTABLE FOR SPUTUM CULTURE  Final   Report Status 09/26/2016 FINAL  Final  Culture, respiratory (NON-Expectorated)     Status: None (Preliminary  result)   Collection Time: 09/26/16  5:30 AM  Result Value Ref Range Status   Specimen Description SPUTUM  Final   Special Requests Normal Reflexed from [id number]  Final   Gram Stain   Final    FEW WBC PRESENT, PREDOMINANTLY PMN FEW GRAM POSITIVE COCCI RARE GRAM POSITIVE RODS RARE GRAM NEGATIVE RODS Performed at [facility], [address] [place], [place] [address]    Culture PENDING  Incomplete   Report Status PENDING  Incomplete   Studies/Results: Ct Angio Chest Pe W Or Wo Contrast  Result Date: 09/25/2016 CLINICAL DATA:  Shortness of breath and cough EXAM: CT ANGIOGRAPHY CHEST WITH CONTRAST TECHNIQUE: Multidetector CT imaging of the chest was performed using the standard protocol during bolus administration of intravenous contrast. Multiplanar CT image reconstructions and MIPs were obtained to evaluate the vascular anatomy. CONTRAST:  100 mL Isovue 370 nonionic COMPARISON:  Chest CT May 16, 2005 and chest radiograph July 18, 2016 FINDINGS: Cardiovascular: There is no demonstrable pulmonary embolus. There is no thoracic aortic aneurysm or dissection. The visualize great vessels appear normal. Pericardium is not appreciably thickened. Mediastinum/Nodes: Thyroid appears unremarkable. There is no appreciable thoracic adenopathy. Lungs/Pleura: On axial slice 40 series 7, there is an 8 x 6 mm nodular opacity abutting the pleura in the anterior segment of the right upper lobe. On axial slice 52 series 4, there is a the 4 mm nodular opacity in the anterior segment of the right upper lobe. On axial slice 61 series 7, there is a 4 mm nodular opacity in the superior segment of the right lower lobe. Lungs elsewhere are clear. No edema or consolidation. No pleural effusion or pleural thickening. Upper Abdomen: Visualized upper abdominal structures are unremarkable. Musculoskeletal: There are no blastic or lytic bone lesions. Review of the MIP images confirms the above findings. IMPRESSION: No  demonstrable pulmonary embolus. No thoracic aortic aneurysm or dissection. No edema or consolidation. Nodular opacities in the right lung, largest measuring 7 mm. Non-contrast chest CT at 6-12 months is recommended. If the nodule is stable at time of repeat CT, then future CT at 18-24 months (from today's scan) is considered optional for low-risk patients, but is recommended for high-risk patients. This recommendation follows the consensus statement: Guidelines for Management of Incidental Pulmonary Nodules Detected on CT Images: From the Fleischner Society 2017; Radiology 2017; 284:228-243. No evident adenopathy. Electronically Signed   By: Lowella Grip III M.D.   On: 09/25/2016 10:44   Medications:  I have reviewed the patient's current medications Scheduled Meds: . aspirin EC  81 mg Oral Daily  . carvedilol  25 mg Oral BID WC  . enoxaparin (LOVENOX) injection  40 mg Subcutaneous Q24H  . glimepiride  2 mg Oral Q breakfast  . guaiFENesin  600 mg Oral BID  . hydrochlorothiazide  25 mg Oral Daily  . insulin aspart  0-20 Units Subcutaneous TID WC  . insulin aspart  0-5 Units Subcutaneous QHS  . insulin glargine  10 Units Subcutaneous Daily  . ipratropium-albuterol  3 mL Nebulization Q6H  . liothyronine  5 mcg Oral Daily  . liraglutide  1.2 mg Subcutaneous Daily  . metFORMIN  1,000 mg Oral BID WC  . methylPREDNISolone (SOLU-MEDROL) injection  40 mg Intravenous Q12H  . pantoprazole  40 mg Oral BID AC  . pravastatin  10 mg Oral QPM  . tamsulosin  0.4 mg Oral Daily   Continuous Infusions: . sodium chloride 50 mL/hr at 09/26/16 2107  . levofloxacin (LEVAQUIN) IV Stopped (09/26/16 2021)   PRN Meds:.acetaminophen **OR** acetaminophen, albuterol, metroNIDAZOLE, ondansetron **OR** ondansetron (ZOFRAN) IV, traMADol, traZODone   Assessment/Plan: #1. Asthmatic bronchitis. Improving. Continue treatment per pulmonology. Sputum culture pending. #2. Diabetes. Restart glimepiride and metformin.  Increase Lantus. Active Problems:   Bronchitis     LOS: 2 days   Kathy Tran 09/27/2016, 7:49 AM

## 2016-09-27 NOTE — Progress Notes (Signed)
Inpatient Diabetes Program Recommendations  AACE/ADA: New Consensus Statement on Inpatient Glycemic Control (2015)  Target Ranges:  Prepandial:   less than 140 mg/dL      Peak postprandial:   less than 180 mg/dL (1-2 hours)      Critically ill patients:  140 - 180 mg/dL  Results for Kathy Tran, Kathy Tran (MRN [id number]) as of 09/27/2016 07:35  Ref. Range 09/27/2016 04:40  Glucose Latest Ref Range: 65 - 99 mg/dL 304 (H)   Results for Kathy Tran, Kathy Tran (MRN [id number]) as of 09/27/2016 07:35  Ref. Range 09/26/2016 07:34 09/26/2016 11:11 09/26/2016 16:00 09/26/2016 20:24  Glucose-Capillary Latest Ref Range: 65 - 99 mg/dL 283 (H) 302 (H) 354 (H) 234 (H)   Review of Glycemic Control  Diabetes history: DM2 Outpatient Diabetes medications: Amaryl 2 mg daily, Metformin XR 1000 mg BID, Victoza 1.2 mg QHS Current orders for Inpatient glycemic control: Lantus 10 units daily, Novolog 0-20 units TID with meals, Novolog 0-5 QHS, Victoza 1.2 mg daily  Inpatient Diabetes Program Recommendations: Insulin - Basal: If steroids are continued, please consider increasing Lantus to 15 units daily. Please note that Lantus will likely need to be adjusted as steroids are tapered. Insulin-Meal Coverage: If steroids are continued, please consider ordering Novolog 5 units TID with meals for meal coverage if patient eats at least 50% of meals.  Thanks, Barnie Alderman, RN, MSN, CDE Diabetes Coordinator Inpatient Diabetes Program [phone] (Team Pager from 8am to 5pm)

## 2016-09-28 LAB — GLUCOSE, CAPILLARY
GLUCOSE-CAPILLARY: 277 mg/dL — AB (ref 65–99)
Glucose-Capillary: 129 mg/dL — ABNORMAL HIGH (ref 65–99)
Glucose-Capillary: 143 mg/dL — ABNORMAL HIGH (ref 65–99)

## 2016-09-28 LAB — CULTURE, RESPIRATORY
CULTURE: NORMAL
SPECIAL REQUESTS: NORMAL

## 2016-09-28 LAB — ASPERGILLUS ANTIBODY BY IMMUNODIFF
ASPERGILLUS FLAVUS: NEGATIVE
ASPERGILLUS NIGER: NEGATIVE
Aspergillus fumigatus, IgG: NEGATIVE

## 2016-09-28 LAB — CULTURE, RESPIRATORY W GRAM STAIN

## 2016-09-28 MED ORDER — LEVOFLOXACIN 750 MG PO TABS
750.0000 mg | ORAL_TABLET | Freq: Every day | ORAL | Status: DC
  Administered 2016-09-28 – 2016-09-29 (×2): 750 mg via ORAL
  Filled 2016-09-28 (×2): qty 1

## 2016-09-28 MED ORDER — FLUCONAZOLE 100 MG PO TABS
150.0000 mg | ORAL_TABLET | Freq: Every day | ORAL | Status: DC
  Administered 2016-09-28 – 2016-09-30 (×3): 150 mg via ORAL
  Filled 2016-09-28 (×3): qty 2

## 2016-09-28 NOTE — Progress Notes (Signed)
Subjective: Coughing and wheezing are improving. No fever. She feels much better. She has noted some vaginal itching  Objective: Vital signs in last 24 hours: Vitals:   09/27/16 1943 09/27/16 2048 09/28/16 0500 09/28/16 0633  BP:  130/71  128/80  Pulse:    77  Resp:  20  18  Temp:  97.5 F (36.4 C)  97.9 F (36.6 C)  TempSrc:  Oral  Oral  SpO2: 97% 98%  98%  Weight:   151 lb 3.2 oz (68.6 kg)   Height:       Weight change: 4 lb 6.4 oz (1.996 kg)  Intake/Output Summary (Last 24 hours) at 09/28/16 0713 Last data filed at 09/28/16 0305  Gross per 24 hour  Intake           2651.4 ml  Output             1550 ml  Net           1101.4 ml    Physical Exam: Alert. Wheezing much improved. Heart regular with no murmurs. Extremities reveal no edema.  Lab Results:    Results for orders placed or performed during the hospital encounter of 09/25/16 (from the past 24 hour(s))  Glucose, capillary     Status: Abnormal   Collection Time: 09/27/16  7:52 AM  Result Value Ref Range   Glucose-Capillary 273 (H) 65 - 99 mg/dL  Glucose, capillary     Status: Abnormal   Collection Time: 09/27/16 11:39 AM  Result Value Ref Range   Glucose-Capillary 317 (H) 65 - 99 mg/dL  Glucose, capillary     Status: Abnormal   Collection Time: 09/27/16  4:31 PM  Result Value Ref Range   Glucose-Capillary 169 (H) 65 - 99 mg/dL   Comment 1 Notify RN    Comment 2 Document in Chart   Glucose, capillary     Status: Abnormal   Collection Time: 09/27/16  9:35 PM  Result Value Ref Range   Glucose-Capillary 134 (H) 65 - 99 mg/dL   Comment 1 Notify RN    Comment 2 Document in Chart      ABGS No results for input(s): PHART, PO2ART, TCO2, HCO3 in the last 72 hours.  Invalid input(s): PCO2 CULTURES Recent Results (from the past 240 hour(s))  Culture, sputum-assessment     Status: None   Collection Time: 09/26/16  5:30 AM  Result Value Ref Range Status   Specimen Description SPUTUM  Final   Special  Requests Normal  Final   Sputum evaluation THIS SPECIMEN IS ACCEPTABLE FOR SPUTUM CULTURE  Final   Report Status 09/26/2016 FINAL  Final  Culture, respiratory (NON-Expectorated)     Status: None (Preliminary result)   Collection Time: 09/26/16  5:30 AM  Result Value Ref Range Status   Specimen Description SPUTUM  Final   Special Requests Normal Reflexed from [id number]  Final   Gram Stain   Final    FEW WBC PRESENT, PREDOMINANTLY PMN FEW GRAM POSITIVE COCCI RARE GRAM POSITIVE RODS RARE GRAM NEGATIVE RODS    Culture   Final    CULTURE REINCUBATED FOR BETTER GROWTH Performed at [facility], [address], [place], [place] [address]    Report Status PENDING  Incomplete   Studies/Results: No results found. Micro Results: Recent Results (from the past 240 hour(s))  Culture, sputum-assessment     Status: None   Collection Time: 09/26/16  5:30 AM  Result Value Ref Range Status   Specimen  Description SPUTUM  Final   Special Requests Normal  Final   Sputum evaluation THIS SPECIMEN IS ACCEPTABLE FOR SPUTUM CULTURE  Final   Report Status 09/26/2016 FINAL  Final  Culture, respiratory (NON-Expectorated)     Status: None (Preliminary result)   Collection Time: 09/26/16  5:30 AM  Result Value Ref Range Status   Specimen Description SPUTUM  Final   Special Requests Normal Reflexed from [id number]  Final   Gram Stain   Final    FEW WBC PRESENT, PREDOMINANTLY PMN FEW GRAM POSITIVE COCCI RARE GRAM POSITIVE RODS RARE GRAM NEGATIVE RODS    Culture   Final    CULTURE REINCUBATED FOR BETTER GROWTH Performed at [facility], [place] [address], [place], [place] [address]    Report Status PENDING  Incomplete   Studies/Results: No results found. Medications:  I have reviewed the patient's current medications Scheduled Meds: . aspirin EC  81 mg Oral Daily  . carvedilol  25 mg Oral BID WC  . enoxaparin (LOVENOX) injection  40 mg Subcutaneous Q24H  . fluconazole  150 mg Oral Daily  .  glimepiride  2 mg Oral Q breakfast  . guaiFENesin  600 mg Oral BID  . hydrochlorothiazide  25 mg Oral Daily  . insulin aspart  0-20 Units Subcutaneous TID WC  . insulin aspart  0-5 Units Subcutaneous QHS  . insulin aspart  5 Units Subcutaneous TID WC  . insulin glargine  15 Units Subcutaneous Daily  . ipratropium-albuterol  3 mL Nebulization TID  . liothyronine  5 mcg Oral Daily  . liraglutide  1.2 mg Subcutaneous Daily  . metFORMIN  1,000 mg Oral BID WC  . methylPREDNISolone (SOLU-MEDROL) injection  40 mg Intravenous Q12H  . pantoprazole  40 mg Oral BID AC  . pravastatin  10 mg Oral QPM  . tamsulosin  0.4 mg Oral Daily   Continuous Infusions: . sodium chloride 50 mL/hr at 09/27/16 2330  . levofloxacin (LEVAQUIN) IV 750 mg (09/27/16 2006)   PRN Meds:.acetaminophen **OR** acetaminophen, albuterol, metroNIDAZOLE, ondansetron **OR** ondansetron (ZOFRAN) IV, traMADol, traZODone   Assessment/Plan: #1. Asthmatic bronchitis. Improving. Treated with IVIG yesterday. #2. Diabetes. Improved with Lantus/NovoLog along with glimepiride, metformin and Victoza. Glucose 134 this morning.  Diflucan 150 mg once today Active Problems:   Bronchitis     LOS: 3 days   Whittney Steenson 09/28/2016, 7:13 AM

## 2016-09-28 NOTE — Progress Notes (Signed)
Pharmacy Antibiotic Note  Kathy Tran is a 61 y.o. female admitted on 09/25/2016 with COPD.  Pharmacy has been consulted for Levaquin dosing.  Afebrile.    Plan: Levaquin 750mg  PO q24hrs (changed from IV) Monitor labs, progress, c/s  Height: 5\' 2"  (157.5 cm) Weight: 151 lb 3.2 oz (68.6 kg) IBW/kg (Calculated) : 50.1  Temp (24hrs), Avg:97.8 F (36.6 C), Min:97.2 F (36.2 C), Max:98.2 F (36.8 C)   Recent Labs Lab 09/25/16 1009 09/25/16 1710 09/27/16 0440  WBC  --  6.9  --   CREATININE 0.60 0.71 0.59    Estimated Creatinine Clearance: 69.6 mL/min (by C-G formula based on SCr of 0.59 mg/dL).    Allergies  Allergen Reactions  . Sulfa Antibiotics Anaphylaxis   PHARMACIST - PHYSICIAN COMMUNICATION CONCERNING: Antibiotic IV to Oral Route Change Policy  RECOMMENDATION: This patient is receiving LEVAQUIN by the intravenous route.  Based on criteria approved by the Pharmacy and Therapeutics Committee, the antibiotic(s) is/are being converted to the equivalent oral dose form(s).  DESCRIPTION: These criteria include:  Patient being treated for a respiratory tract infection, urinary tract infection, cellulitis or clostridium difficile associated diarrhea if on metronidazole  The patient is not neutropenic and does not exhibit a GI malabsorption state  The patient is eating (either orally or via tube) and/or has been taking other orally administered medications for a least 24 hours  The patient is improving clinically and has a Tmax < 100.5  If you have questions about this conversion, please contact the Pharmacy Department  [x]   [phone] )  [name]  Antimicrobials this admission: Levaquin 5/16 >>   Microbiology results: 5/16 Sputum/Resp Cx: pending (GPC, GPR, GNR)  Thank you for allowing pharmacy to be a part of this patient's care.  [name] 09/28/2016 8:24 AM

## 2016-09-28 NOTE — Progress Notes (Signed)
Subjective: She continues to improve from her severe asthmatic bronchitis. She did not respond to intensive outpatient treatment. She says she feels better. She has been able to ambulate in the hall.  Objective: Vital signs in last 24 hours: Temp:  [97.2 F (36.2 C)-98.2 F (36.8 C)] 97.9 F (36.6 C) (05/19 6948) Pulse Rate:  [77-105] 77 (05/19 0633) Resp:  [16-20] 18 (05/19 5462) BP: (118-147)/(65-93) 128/80 (05/19 0633) SpO2:  [94 %-99 %] 96 % (05/19 0728) Weight:  [68.6 kg (151 lb 3.2 oz)] 68.6 kg (151 lb 3.2 oz) (05/19 0500) Weight change: 1.996 kg (4 lb 6.4 oz) Last BM Date: 09/28/16  Intake/Output from previous day: 05/18 0701 - 05/19 0700 In: 2651.4 [P.O.:840; I.V.:1511.4; IV Piggyback:300] Out: 1550 [Urine:1550]  PHYSICAL EXAM General appearance: alert, cooperative and no distress Resp: rhonchi bilaterally Cardio: regular rate and rhythm, S1, S2 normal, no murmur, click, rub or gallop GI: soft, non-tender; bowel sounds normal; no masses,  no organomegaly Extremities: extremities normal, atraumatic, no cyanosis or edema Skin warm and dry  Lab Results:  Results for orders placed or performed during the hospital encounter of 09/25/16 (from the past 48 hour(s))  Glucose, capillary     Status: Abnormal   Collection Time: 09/26/16 11:11 AM  Result Value Ref Range   Glucose-Capillary 302 (H) 65 - 99 mg/dL   Comment 1 Notify RN    Comment 2 Document in Chart   Glucose, capillary     Status: Abnormal   Collection Time: 09/26/16  4:00 PM  Result Value Ref Range   Glucose-Capillary 354 (H) 65 - 99 mg/dL   Comment 1 Notify RN    Comment 2 Document in Chart   Glucose, capillary     Status: Abnormal   Collection Time: 09/26/16  8:24 PM  Result Value Ref Range   Glucose-Capillary 234 (H) 65 - 99 mg/dL   Comment 1 Notify RN    Comment 2 Document in Chart   Basic metabolic panel     Status: Abnormal   Collection Time: 09/27/16  4:40 AM  Result Value Ref Range   Sodium 136  135 - 145 mmol/L   Potassium 3.9 3.5 - 5.1 mmol/L   Chloride 104 101 - 111 mmol/L   CO2 25 22 - 32 mmol/L   Glucose, Bld 304 (H) 65 - 99 mg/dL   BUN 20 6 - 20 mg/dL   Creatinine, Ser 0.59 0.44 - 1.00 mg/dL   Calcium 9.1 8.9 - 10.3 mg/dL   GFR calc non Af Amer >60 >60 mL/min   GFR calc Af Amer >60 >60 mL/min    Comment: (NOTE) The eGFR has been calculated using the CKD EPI equation. This calculation has not been validated in all clinical situations. eGFR's persistently <60 mL/min signify possible Chronic Kidney Disease.    Anion gap 7 5 - 15  Glucose, capillary     Status: Abnormal   Collection Time: 09/27/16  7:52 AM  Result Value Ref Range   Glucose-Capillary 273 (H) 65 - 99 mg/dL  Glucose, capillary     Status: Abnormal   Collection Time: 09/27/16 11:39 AM  Result Value Ref Range   Glucose-Capillary 317 (H) 65 - 99 mg/dL  Glucose, capillary     Status: Abnormal   Collection Time: 09/27/16  4:31 PM  Result Value Ref Range   Glucose-Capillary 169 (H) 65 - 99 mg/dL   Comment 1 Notify RN    Comment 2 Document in Chart   Glucose, capillary  Status: Abnormal   Collection Time: 09/27/16  9:35 PM  Result Value Ref Range   Glucose-Capillary 134 (H) 65 - 99 mg/dL   Comment 1 Notify RN    Comment 2 Document in Chart   Glucose, capillary     Status: Abnormal   Collection Time: 09/28/16  7:42 AM  Result Value Ref Range   Glucose-Capillary 129 (H) 65 - 99 mg/dL   Comment 1 Notify RN    Comment 2 Document in Chart     ABGS No results for input(s): PHART, PO2ART, TCO2, HCO3 in the last 72 hours.  Invalid input(s): PCO2 CULTURES Recent Results (from the past 240 hour(s))  Culture, sputum-assessment     Status: None   Collection Time: 09/26/16  5:30 AM  Result Value Ref Range Status   Specimen Description SPUTUM  Final   Special Requests Normal  Final   Sputum evaluation THIS SPECIMEN IS ACCEPTABLE FOR SPUTUM CULTURE  Final   Report Status 09/26/2016 FINAL  Final   Culture, respiratory (NON-Expectorated)     Status: None (Preliminary result)   Collection Time: 09/26/16  5:30 AM  Result Value Ref Range Status   Specimen Description SPUTUM  Final   Special Requests Normal Reflexed from [id number]  Final   Gram Stain   Final    FEW WBC PRESENT, PREDOMINANTLY PMN FEW GRAM POSITIVE COCCI RARE GRAM POSITIVE RODS RARE GRAM NEGATIVE RODS    Culture   Final    CULTURE REINCUBATED FOR BETTER GROWTH Performed at [facility], [address], [place], [place] [address]    Report Status PENDING  Incomplete   Studies/Results: No results found.  Medications:  Prior to Admission:  Prescriptions Prior to Admission  Medication Sig Dispense Refill Last Dose  . albuterol (PROVENTIL) (2.5 MG/3ML) 0.083% nebulizer solution Take 2.5 mg by nebulization every 6 (six) hours as needed.   09/25/2016 at Unknown time  . aspirin EC 81 MG tablet Take 1 tablet by mouth on Monday, Wednesday, and Friday   Past Week at Unknown time  . carvedilol (COREG) 25 MG tablet Take 25 mg by mouth 2 (two) times daily with a meal.   09/25/2016 at 1800  . cetirizine (ZYRTEC) 10 MG tablet Take 10 mg by mouth at bedtime.   09/24/2016 at Unknown time  . doxycycline (VIBRA-TABS) 100 MG tablet Take 1 tablet by mouth 2 (two) times daily.   09/25/2016 at Unknown time  . glimepiride (AMARYL) 2 MG tablet Take 2 mg by mouth daily.   09/25/2016 at Unknown time  . hydrochlorothiazide (HYDRODIURIL) 25 MG tablet Take 25 mg by mouth daily.    09/25/2016 at Unknown time  . liothyronine (CYTOMEL) 5 MCG tablet Take 5 mcg by mouth daily.   09/25/2016 at Unknown time  . meloxicam (MOBIC) 15 MG tablet Take 15 mg by mouth daily as needed for pain.    09/24/2016 at Unknown time  . metFORMIN (GLUCOPHAGE-XR) 500 MG 24 hr tablet Take 1,000 mg by mouth 2 (two) times daily.   09/25/2016 at Unknown time  . nystatin (MYCOSTATIN) 100000 UNIT/ML suspension Take 5 mLs by mouth 4 (four) times daily.    09/25/2016 at Unknown time  .  pantoprazole (PROTONIX) 40 MG tablet Take 1 tablet (40 mg total) by mouth 2 (two) times daily before a meal. 30 tablet 2 09/25/2016 at Unknown time  . pravastatin (PRAVACHOL) 10 MG tablet Take 10 mg by mouth every evening.   09/24/2016 at Unknown time  .  traMADol (ULTRAM) 50 MG tablet Take 50-100 mg by mouth every 6 (six) hours as needed (for pain.). Reported on 09/28/2015   unknown  . VENTOLIN HFA 108 (90 Base) MCG/ACT inhaler Inhale 2 puffs into the lungs every 6 (six) hours as needed for wheezing or shortness of breath.    unknown  . VICTOZA 18 MG/3ML SOPN Inject 1.2 mg into the skin at bedtime.    09/24/2016 at Unknown time   Scheduled: . aspirin EC  81 mg Oral Daily  . carvedilol  25 mg Oral BID WC  . enoxaparin (LOVENOX) injection  40 mg Subcutaneous Q24H  . fluconazole  150 mg Oral Daily  . glimepiride  2 mg Oral Q breakfast  . guaiFENesin  600 mg Oral BID  . hydrochlorothiazide  25 mg Oral Daily  . insulin aspart  0-20 Units Subcutaneous TID WC  . insulin aspart  0-5 Units Subcutaneous QHS  . insulin aspart  5 Units Subcutaneous TID WC  . insulin glargine  15 Units Subcutaneous Daily  . ipratropium-albuterol  3 mL Nebulization TID  . levofloxacin  750 mg Oral q1800  . liothyronine  5 mcg Oral Daily  . liraglutide  1.2 mg Subcutaneous Daily  . metFORMIN  1,000 mg Oral BID WC  . methylPREDNISolone (SOLU-MEDROL) injection  40 mg Intravenous Q12H  . pantoprazole  40 mg Oral BID AC  . pravastatin  10 mg Oral QPM  . tamsulosin  0.4 mg Oral Daily   Continuous:  WUJ:[id number] **OR** acetaminophen, albuterol, metroNIDAZOLE, ondansetron **OR** ondansetron (ZOFRAN) IV, traMADol, traZODone  Assesment: She has asthmatic bronchitis. She is responding to current treatment. I would continue IV antibiotics etc. at least today. She was very slow to respond as an outpatient and in fact got worse on intensive antibiotic and steroid therapy. Active Problems:   Bronchitis    Plan: Continue  treatments    LOS: 3 days   Kaylum Shrum L 09/28/2016, 8:47 AM

## 2016-09-29 LAB — GLUCOSE, CAPILLARY
GLUCOSE-CAPILLARY: 216 mg/dL — AB (ref 65–99)
GLUCOSE-CAPILLARY: 280 mg/dL — AB (ref 65–99)
Glucose-Capillary: 157 mg/dL — ABNORMAL HIGH (ref 65–99)
Glucose-Capillary: 266 mg/dL — ABNORMAL HIGH (ref 65–99)

## 2016-09-29 MED ORDER — SALINE SPRAY 0.65 % NA SOLN
1.0000 | NASAL | Status: DC | PRN
  Filled 2016-09-29: qty 44

## 2016-09-29 NOTE — Progress Notes (Signed)
Subjective: She says she is still having a lot of cough and congestion. No other new complaints. She is still wheezing.  Objective: Vital signs in last 24 hours: Temp:  [97.7 F (36.5 C)-97.9 F (36.6 C)] 97.9 F (36.6 C) (05/20 4332) Pulse Rate:  [84-90] 90 (05/20 0613) Resp:  [18] 18 (05/20 0613) BP: (116-134)/(74-79) 116/79 (05/20 0613) SpO2:  [96 %-99 %] 96 % (05/20 0728) Weight:  [65.7 kg (144 lb 12.8 oz)] 65.7 kg (144 lb 12.8 oz) (05/20 9518) Weight change: -2.903 kg (-6 lb 6.4 oz) Last BM Date: 09/28/16  Intake/Output from previous day: 05/19 0701 - 05/20 0700 In: 480 [P.O.:480] Out: 2900 [Urine:2900]  PHYSICAL EXAM General appearance: alert, cooperative and mild distress Resp: rhonchi bilaterally and wheezes bilaterally Cardio: regular rate and rhythm, S1, S2 normal, no murmur, click, rub or gallop GI: soft, non-tender; bowel sounds normal; no masses,  no organomegaly Extremities: extremities normal, atraumatic, no cyanosis or edema Mucous membranes are moist  Lab Results:  Results for orders placed or performed during the hospital encounter of 09/25/16 (from the past 48 hour(s))  Glucose, capillary     Status: Abnormal   Collection Time: 09/27/16 11:39 AM  Result Value Ref Range   Glucose-Capillary 317 (H) 65 - 99 mg/dL  Glucose, capillary     Status: Abnormal   Collection Time: 09/27/16  4:31 PM  Result Value Ref Range   Glucose-Capillary 169 (H) 65 - 99 mg/dL   Comment 1 Notify RN    Comment 2 Document in Chart   Glucose, capillary     Status: Abnormal   Collection Time: 09/27/16  9:35 PM  Result Value Ref Range   Glucose-Capillary 134 (H) 65 - 99 mg/dL   Comment 1 Notify RN    Comment 2 Document in Chart   Glucose, capillary     Status: Abnormal   Collection Time: 09/28/16  7:42 AM  Result Value Ref Range   Glucose-Capillary 129 (H) 65 - 99 mg/dL   Comment 1 Notify RN    Comment 2 Document in Chart   Glucose, capillary     Status: Abnormal    Collection Time: 09/28/16 11:36 AM  Result Value Ref Range   Glucose-Capillary 277 (H) 65 - 99 mg/dL   Comment 1 Notify RN    Comment 2 Document in Chart   Glucose, capillary     Status: Abnormal   Collection Time: 09/28/16  9:18 PM  Result Value Ref Range   Glucose-Capillary 143 (H) 65 - 99 mg/dL   Comment 1 Notify RN    Comment 2 Document in Chart   Glucose, capillary     Status: Abnormal   Collection Time: 09/29/16  8:00 AM  Result Value Ref Range   Glucose-Capillary 216 (H) 65 - 99 mg/dL   Comment 1 Notify RN    Comment 2 Document in Chart     ABGS No results for input(s): PHART, PO2ART, TCO2, HCO3 in the last 72 hours.  Invalid input(s): PCO2 CULTURES Recent Results (from the past 240 hour(s))  Culture, sputum-assessment     Status: None   Collection Time: 09/26/16  5:30 AM  Result Value Ref Range Status   Specimen Description SPUTUM  Final   Special Requests Normal  Final   Sputum evaluation THIS SPECIMEN IS ACCEPTABLE FOR SPUTUM CULTURE  Final   Report Status 09/26/2016 FINAL  Final  Culture, respiratory (NON-Expectorated)     Status: None   Collection Time: 09/26/16  5:30 AM  Result Value Ref Range Status   Specimen Description SPUTUM  Final   Special Requests Normal Reflexed from [id number]  Final   Gram Stain   Final    FEW WBC PRESENT, PREDOMINANTLY PMN FEW GRAM POSITIVE COCCI RARE GRAM POSITIVE RODS RARE GRAM NEGATIVE RODS    Culture   Final    Consistent with normal respiratory flora. Performed at [facility], [place] [address], [place], [place] [address]    Report Status 09/28/2016 FINAL  Final   Studies/Results: No results found.  Medications:  Prior to Admission:  Prescriptions Prior to Admission  Medication Sig Dispense Refill Last Dose  . albuterol (PROVENTIL) (2.5 MG/3ML) 0.083% nebulizer solution Take 2.5 mg by nebulization every 6 (six) hours as needed.   09/25/2016 at Unknown time  . aspirin EC 81 MG tablet Take 1 tablet by mouth on Monday,  Wednesday, and Friday   Past Week at Unknown time  . carvedilol (COREG) 25 MG tablet Take 25 mg by mouth 2 (two) times daily with a meal.   09/25/2016 at 1800  . cetirizine (ZYRTEC) 10 MG tablet Take 10 mg by mouth at bedtime.   09/24/2016 at Unknown time  . doxycycline (VIBRA-TABS) 100 MG tablet Take 1 tablet by mouth 2 (two) times daily.   09/25/2016 at Unknown time  . glimepiride (AMARYL) 2 MG tablet Take 2 mg by mouth daily.   09/25/2016 at Unknown time  . hydrochlorothiazide (HYDRODIURIL) 25 MG tablet Take 25 mg by mouth daily.    09/25/2016 at Unknown time  . liothyronine (CYTOMEL) 5 MCG tablet Take 5 mcg by mouth daily.   09/25/2016 at Unknown time  . meloxicam (MOBIC) 15 MG tablet Take 15 mg by mouth daily as needed for pain.    09/24/2016 at Unknown time  . metFORMIN (GLUCOPHAGE-XR) 500 MG 24 hr tablet Take 1,000 mg by mouth 2 (two) times daily.   09/25/2016 at Unknown time  . nystatin (MYCOSTATIN) 100000 UNIT/ML suspension Take 5 mLs by mouth 4 (four) times daily.    09/25/2016 at Unknown time  . pantoprazole (PROTONIX) 40 MG tablet Take 1 tablet (40 mg total) by mouth 2 (two) times daily before a meal. 30 tablet 2 09/25/2016 at Unknown time  . pravastatin (PRAVACHOL) 10 MG tablet Take 10 mg by mouth every evening.   09/24/2016 at Unknown time  . traMADol (ULTRAM) 50 MG tablet Take 50-100 mg by mouth every 6 (six) hours as needed (for pain.). Reported on 09/28/2015   unknown  . VENTOLIN HFA 108 (90 Base) MCG/ACT inhaler Inhale 2 puffs into the lungs every 6 (six) hours as needed for wheezing or shortness of breath.    unknown  . VICTOZA 18 MG/3ML SOPN Inject 1.2 mg into the skin at bedtime.    09/24/2016 at Unknown time   Scheduled: . aspirin EC  81 mg Oral Daily  . carvedilol  25 mg Oral BID WC  . enoxaparin (LOVENOX) injection  40 mg Subcutaneous Q24H  . fluconazole  150 mg Oral Daily  . glimepiride  2 mg Oral Q breakfast  . guaiFENesin  600 mg Oral BID  . hydrochlorothiazide  25 mg Oral Daily   . insulin aspart  0-20 Units Subcutaneous TID WC  . insulin aspart  0-5 Units Subcutaneous QHS  . insulin aspart  5 Units Subcutaneous TID WC  . insulin glargine  15 Units Subcutaneous Daily  . ipratropium-albuterol  3 mL Nebulization TID  . levofloxacin  750 mg Oral  [id number]  . liothyronine  5 mcg Oral Daily  . liraglutide  1.2 mg Subcutaneous Daily  . metFORMIN  1,000 mg Oral BID WC  . methylPREDNISolone (SOLU-MEDROL) injection  40 mg Intravenous Q12H  . pantoprazole  40 mg Oral BID AC  . pravastatin  10 mg Oral QPM  . tamsulosin  0.4 mg Oral Daily   Continuous:  FHS:[id number] **OR** acetaminophen, albuterol, metroNIDAZOLE, ondansetron **OR** ondansetron (ZOFRAN) IV, traMADol, traZODone  Assesment: She was admitted with acute asthmatic bronchitis. She I think is worse now. No other new complaints. Active Problems:   Bronchitis    Plan: Continue current medications. Continue IV steroids and IV antibiotics    LOS: 4 days   Brittay Mogle L 09/29/2016, 10:01 AM

## 2016-09-29 NOTE — Progress Notes (Signed)
Subjective: She has more wheezing and coughing with mucus production then yesterday morning. No fever.  Objective: Vital signs in last 24 hours: Vitals:   09/28/16 2017 09/28/16 2157 09/29/16 0613 09/29/16 0728  BP:  134/74 116/79   Pulse:  84 90   Resp:  18 18   Temp:  97.7 F (36.5 C) 97.9 F (36.6 C)   TempSrc:  Oral Oral   SpO2: 96% 99% 97% 96%  Weight:   144 lb 12.8 oz (65.7 kg)   Height:       Weight change: -6 lb 6.4 oz (-2.903 kg)  Intake/Output Summary (Last 24 hours) at 09/29/16 0911 Last data filed at 09/29/16 0009  Gross per 24 hour  Intake              240 ml  Output             2900 ml  Net            -2660 ml    Physical Exam: Alert. Lungs reveal bilateral wheezes. Heart regular with no murmurs. Extremities reveal no edema.  Lab Results:    Results for orders placed or performed during the hospital encounter of 09/25/16 (from the past 24 hour(s))  Glucose, capillary     Status: Abnormal   Collection Time: 09/28/16 11:36 AM  Result Value Ref Range   Glucose-Capillary 277 (H) 65 - 99 mg/dL   Comment 1 Notify RN    Comment 2 Document in Chart   Glucose, capillary     Status: Abnormal   Collection Time: 09/28/16  9:18 PM  Result Value Ref Range   Glucose-Capillary 143 (H) 65 - 99 mg/dL   Comment 1 Notify RN    Comment 2 Document in Chart   Glucose, capillary     Status: Abnormal   Collection Time: 09/29/16  8:00 AM  Result Value Ref Range   Glucose-Capillary 216 (H) 65 - 99 mg/dL   Comment 1 Notify RN    Comment 2 Document in Chart      ABGS No results for input(s): PHART, PO2ART, TCO2, HCO3 in the last 72 hours.  Invalid input(s): PCO2 CULTURES Recent Results (from the past 240 hour(s))  Culture, sputum-assessment     Status: None   Collection Time: 09/26/16  5:30 AM  Result Value Ref Range Status   Specimen Description SPUTUM  Final   Special Requests Normal  Final   Sputum evaluation THIS SPECIMEN IS ACCEPTABLE FOR SPUTUM CULTURE  Final    Report Status 09/26/2016 FINAL  Final  Culture, respiratory (NON-Expectorated)     Status: None   Collection Time: 09/26/16  5:30 AM  Result Value Ref Range Status   Specimen Description SPUTUM  Final   Special Requests Normal Reflexed from [id number]  Final   Gram Stain   Final    FEW WBC PRESENT, PREDOMINANTLY PMN FEW GRAM POSITIVE COCCI RARE GRAM POSITIVE RODS RARE GRAM NEGATIVE RODS    Culture   Final    Consistent with normal respiratory flora. Performed at [facility], [place] [address] [place],  [address]    Report Status 09/28/2016 FINAL  Final   Studies/Results: No results found. Micro Results: Recent Results (from the past 240 hour(s))  Culture, sputum-assessment     Status: None   Collection Time: 09/26/16  5:30 AM  Result Value Ref Range Status   Specimen Description SPUTUM  Final   Special Requests Normal  Final   Sputum evaluation  THIS SPECIMEN IS ACCEPTABLE FOR SPUTUM CULTURE  Final   Report Status 09/26/2016 FINAL  Final  Culture, respiratory (NON-Expectorated)     Status: None   Collection Time: 09/26/16  5:30 AM  Result Value Ref Range Status   Specimen Description SPUTUM  Final   Special Requests Normal Reflexed from [id number]  Final   Gram Stain   Final    FEW WBC PRESENT, PREDOMINANTLY PMN FEW GRAM POSITIVE COCCI RARE GRAM POSITIVE RODS RARE GRAM NEGATIVE RODS    Culture   Final    Consistent with normal respiratory flora. Performed at [facility], [place] [address] [place],  [address]    Report Status 09/28/2016 FINAL  Final   Studies/Results: No results found. Medications:  I have reviewed the patient's current medications Scheduled Meds: . aspirin EC  81 mg Oral Daily  . carvedilol  25 mg Oral BID WC  . enoxaparin (LOVENOX) injection  40 mg Subcutaneous Q24H  . fluconazole  150 mg Oral Daily  . glimepiride  2 mg Oral Q breakfast  . guaiFENesin  600 mg Oral BID  . hydrochlorothiazide  25 mg Oral Daily  . insulin aspart   0-20 Units Subcutaneous TID WC  . insulin aspart  0-5 Units Subcutaneous QHS  . insulin aspart  5 Units Subcutaneous TID WC  . insulin glargine  15 Units Subcutaneous Daily  . ipratropium-albuterol  3 mL Nebulization TID  . levofloxacin  750 mg Oral q1800  . liothyronine  5 mcg Oral Daily  . liraglutide  1.2 mg Subcutaneous Daily  . metFORMIN  1,000 mg Oral BID WC  . methylPREDNISolone (SOLU-MEDROL) injection  40 mg Intravenous Q12H  . pantoprazole  40 mg Oral BID AC  . pravastatin  10 mg Oral QPM  . tamsulosin  0.4 mg Oral Daily   Continuous Infusions: PRN Meds:.acetaminophen **OR** acetaminophen, albuterol, metroNIDAZOLE, ondansetron **OR** ondansetron (ZOFRAN) IV, traMADol, traZODone   Assessment/Plan: #1. Asthmatic bronchitis. Symptoms persist. Sputum culture reveals normal flora. Continue Levaquin, Solu-Medrol and duo nebs. #2. Diabetes. Glucose 216. Continue current treatment. Active Problems:   Bronchitis     LOS: 4 days   Kathy Tran 09/29/2016, 9:11 AM

## 2016-09-30 LAB — BASIC METABOLIC PANEL
Anion gap: 8 (ref 5–15)
BUN: 23 mg/dL — AB (ref 6–20)
CALCIUM: 9.4 mg/dL (ref 8.9–10.3)
CO2: 27 mmol/L (ref 22–32)
Chloride: 100 mmol/L — ABNORMAL LOW (ref 101–111)
Creatinine, Ser: 0.67 mg/dL (ref 0.44–1.00)
GFR calc Af Amer: >60 mL/min (ref >60)
Glucose, Bld: 247 mg/dL — ABNORMAL HIGH (ref 65–99)
POTASSIUM: 4.3 mmol/L (ref 3.5–5.1)
Sodium: 135 mmol/L (ref 135–145)

## 2016-09-30 LAB — GLUCOSE, CAPILLARY
GLUCOSE-CAPILLARY: 202 mg/dL — AB (ref 65–99)
Glucose-Capillary: 306 mg/dL — ABNORMAL HIGH (ref 65–99)

## 2016-09-30 MED ORDER — IPRATROPIUM-ALBUTEROL 0.5-2.5 (3) MG/3ML IN SOLN: 3.0000 mL | Freq: Three times a day (TID) | RESPIRATORY_TRACT | 5 refills | Status: DC

## 2016-09-30 MED ORDER — INSULIN GLARGINE 100 UNIT/ML ~~LOC~~ SOLN
15.0000 [IU] | Freq: Every day | SUBCUTANEOUS | 11 refills | Status: DC
Start: 2016-09-30 — End: 2016-10-29

## 2016-09-30 MED ORDER — LEVOFLOXACIN 750 MG PO TABS: 750.0000 mg | ORAL_TABLET | Freq: Every day | ORAL | 0 refills | Status: DC

## 2016-09-30 MED ORDER — GUAIFENESIN ER 600 MG PO TB12: 600.0000 mg | ORAL_TABLET | Freq: Two times a day (BID) | ORAL | 1 refills | Status: AC

## 2016-09-30 MED ORDER — PREDNISONE 10 MG PO TABS: ORAL_TABLET | ORAL | 0 refills | Status: DC

## 2016-09-30 NOTE — Progress Notes (Signed)
Subjective: She feels much better and wants to go home. She has no new complaints. Her breathing is back to normal  Objective: Vital signs in last 24 hours: Temp:  [97.6 F (36.4 C)-97.8 F (36.6 C)] 97.8 F (36.6 C) (05/21 0646) Pulse Rate:  [76-99] 76 (05/21 0646) Resp:  [18-20] 20 (05/21 0646) BP: (127-151)/(70-80) 142/75 (05/21 0646) SpO2:  [96 %-98 %] 98 % (05/21 0646) Weight:  [66 kg (145 lb 8 oz)] 66 kg (145 lb 8 oz) (05/21 0646) Weight change: 0.318 kg (11.2 oz) Last BM Date: 09/28/16  Intake/Output from previous day: 05/20 0701 - 05/21 0700 In: 240 [P.O.:240] Out: 1000 [Urine:1000]  PHYSICAL EXAM General appearance: alert, cooperative and no distress Resp: clear to auscultation bilaterally Cardio: regular rate and rhythm, S1, S2 normal, no murmur, click, rub or gallop GI: soft, non-tender; bowel sounds normal; no masses,  no organomegaly Extremities: extremities normal, atraumatic, no cyanosis or edema Skin warm and dry  Lab Results:  Results for orders placed or performed during the hospital encounter of 09/25/16 (from the past 48 hour(s))  Glucose, capillary     Status: Abnormal   Collection Time: 09/28/16 11:36 AM  Result Value Ref Range   Glucose-Capillary 277 (H) 65 - 99 mg/dL   Comment 1 Notify RN    Comment 2 Document in Chart   Glucose, capillary     Status: Abnormal   Collection Time: 09/28/16  9:18 PM  Result Value Ref Range   Glucose-Capillary 143 (H) 65 - 99 mg/dL   Comment 1 Notify RN    Comment 2 Document in Chart   Glucose, capillary     Status: Abnormal   Collection Time: 09/29/16  8:00 AM  Result Value Ref Range   Glucose-Capillary 216 (H) 65 - 99 mg/dL   Comment 1 Notify RN    Comment 2 Document in Chart   Glucose, capillary     Status: Abnormal   Collection Time: 09/29/16 11:38 AM  Result Value Ref Range   Glucose-Capillary 280 (H) 65 - 99 mg/dL   Comment 1 Notify RN    Comment 2 Document in Chart   Glucose, capillary     Status:  Abnormal   Collection Time: 09/29/16  4:30 PM  Result Value Ref Range   Glucose-Capillary 157 (H) 65 - 99 mg/dL   Comment 1 Notify RN    Comment 2 Document in Chart   Glucose, capillary     Status: Abnormal   Collection Time: 09/29/16  9:18 PM  Result Value Ref Range   Glucose-Capillary 266 (H) 65 - 99 mg/dL  Basic metabolic panel     Status: Abnormal   Collection Time: 09/30/16  4:39 AM  Result Value Ref Range   Sodium 135 135 - 145 mmol/L   Potassium 4.3 3.5 - 5.1 mmol/L   Chloride 100 (L) 101 - 111 mmol/L   CO2 27 22 - 32 mmol/L   Glucose, Bld 247 (H) 65 - 99 mg/dL   BUN 23 (H) 6 - 20 mg/dL   Creatinine, Ser 0.67 0.44 - 1.00 mg/dL   Calcium 9.4 8.9 - 10.3 mg/dL   GFR calc non Af Amer >60 >60 mL/min   GFR calc Af Amer >60 >60 mL/min    Comment: (NOTE) The eGFR has been calculated using the CKD EPI equation. This calculation has not been validated in all clinical situations. eGFR's persistently <60 mL/min signify possible Chronic Kidney Disease.    Anion gap 8 5 - 15  Glucose, capillary     Status: Abnormal   Collection Time: 09/30/16  7:30 AM  Result Value Ref Range   Glucose-Capillary 306 (H) 65 - 99 mg/dL   Comment 1 Notify RN    Comment 2 Document in Chart     ABGS No results for input(s): PHART, PO2ART, TCO2, HCO3 in the last 72 hours.  Invalid input(s): PCO2 CULTURES Recent Results (from the past 240 hour(s))  Culture, sputum-assessment     Status: None   Collection Time: 09/26/16  5:30 AM  Result Value Ref Range Status   Specimen Description SPUTUM  Final   Special Requests Normal  Final   Sputum evaluation THIS SPECIMEN IS ACCEPTABLE FOR SPUTUM CULTURE  Final   Report Status 09/26/2016 FINAL  Final  Culture, respiratory (NON-Expectorated)     Status: None   Collection Time: 09/26/16  5:30 AM  Result Value Ref Range Status   Specimen Description SPUTUM  Final   Special Requests Normal Reflexed from [id number]  Final   Gram Stain   Final    FEW WBC PRESENT,  PREDOMINANTLY PMN FEW GRAM POSITIVE COCCI RARE GRAM POSITIVE RODS RARE GRAM NEGATIVE RODS    Culture   Final    Consistent with normal respiratory flora. Performed at [facility], [place] [address] [place], [place] [address]    Report Status 09/28/2016 FINAL  Final   Studies/Results: No results found.  Medications:  Prior to Admission:  Prescriptions Prior to Admission  Medication Sig Dispense Refill Last Dose  . albuterol (PROVENTIL) (2.5 MG/3ML) 0.083% nebulizer solution Take 2.5 mg by nebulization every 6 (six) hours as needed.   09/25/2016 at Unknown time  . aspirin EC 81 MG tablet Take 1 tablet by mouth on Monday, Wednesday, and Friday   Past Week at Unknown time  . carvedilol (COREG) 25 MG tablet Take 25 mg by mouth 2 (two) times daily with a meal.   09/25/2016 at 1800  . cetirizine (ZYRTEC) 10 MG tablet Take 10 mg by mouth at bedtime.   09/24/2016 at Unknown time  . doxycycline (VIBRA-TABS) 100 MG tablet Take 1 tablet by mouth 2 (two) times daily.   09/25/2016 at Unknown time  . glimepiride (AMARYL) 2 MG tablet Take 2 mg by mouth daily.   09/25/2016 at Unknown time  . hydrochlorothiazide (HYDRODIURIL) 25 MG tablet Take 25 mg by mouth daily.    09/25/2016 at Unknown time  . liothyronine (CYTOMEL) 5 MCG tablet Take 5 mcg by mouth daily.   09/25/2016 at Unknown time  . meloxicam (MOBIC) 15 MG tablet Take 15 mg by mouth daily as needed for pain.    09/24/2016 at Unknown time  . metFORMIN (GLUCOPHAGE-XR) 500 MG 24 hr tablet Take 1,000 mg by mouth 2 (two) times daily.   09/25/2016 at Unknown time  . nystatin (MYCOSTATIN) 100000 UNIT/ML suspension Take 5 mLs by mouth 4 (four) times daily.    09/25/2016 at Unknown time  . pantoprazole (PROTONIX) 40 MG tablet Take 1 tablet (40 mg total) by mouth 2 (two) times daily before a meal. 30 tablet 2 09/25/2016 at Unknown time  . pravastatin (PRAVACHOL) 10 MG tablet Take 10 mg by mouth every evening.   09/24/2016 at Unknown time  . traMADol (ULTRAM) 50  MG tablet Take 50-100 mg by mouth every 6 (six) hours as needed (for pain.). Reported on 09/28/2015   unknown  . VENTOLIN HFA 108 (90 Base) MCG/ACT inhaler Inhale 2 puffs into the lungs every 6 (  six) hours as needed for wheezing or shortness of breath.    unknown  . VICTOZA 18 MG/3ML SOPN Inject 1.2 mg into the skin at bedtime.    09/24/2016 at Unknown time   Scheduled: . aspirin EC  81 mg Oral Daily  . carvedilol  25 mg Oral BID WC  . enoxaparin (LOVENOX) injection  40 mg Subcutaneous Q24H  . fluconazole  150 mg Oral Daily  . glimepiride  2 mg Oral Q breakfast  . guaiFENesin  600 mg Oral BID  . hydrochlorothiazide  25 mg Oral Daily  . insulin aspart  0-20 Units Subcutaneous TID WC  . insulin aspart  0-5 Units Subcutaneous QHS  . insulin aspart  5 Units Subcutaneous TID WC  . insulin glargine  15 Units Subcutaneous Daily  . ipratropium-albuterol  3 mL Nebulization TID  . levofloxacin  750 mg Oral q1800  . liothyronine  5 mcg Oral Daily  . liraglutide  1.2 mg Subcutaneous Daily  . metFORMIN  1,000 mg Oral BID WC  . methylPREDNISolone (SOLU-MEDROL) injection  40 mg Intravenous Q12H  . pantoprazole  40 mg Oral BID AC  . pravastatin  10 mg Oral QPM  . tamsulosin  0.4 mg Oral Daily   Continuous:  ZCK:[id number] **OR** acetaminophen, albuterol, metroNIDAZOLE, ondansetron **OR** ondansetron (ZOFRAN) IV, sodium chloride, traMADol, traZODone  Assesment: She was admitted with acute asthmatic bronchitis. She is substantially better. 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 She may require further treatment but I would measure a level as an outpatient and see what it is. Agree she could be discharged today Active Problems:   Bronchitis    Plan: As above    LOS: 5 days   Dany Harten L 09/30/2016, 8:18 AM

## 2016-09-30 NOTE — Discharge Summary (Signed)
Physician Discharge Summary  [name] VZD:[id number] DOB: [birth year] DOA: 09/25/2016   Admit date: 09/25/2016 Discharge date: 09/30/2016  Discharge Diagnoses:  Active Problems:   Bronchitis    Wt Readings from Last 3 Encounters:  09/30/16 145 lb 8 oz (66 kg)  07/18/16 149 lb (67.6 kg)  11/08/15 150 lb 6.4 oz (68.2 kg)     Hospital Course:  This patient is a 61 year old female who presented with coughing, wheezing and shortness of breath. She was hospitalized by Dr. Luan Pulling. Chest x-ray revealed no acute infiltrate. She did not have fever. She had not respond to maximal outpatient therapy. She was treated with intravenous levofloxacin, intravenous Solu-Medrol and ipratropium/albuterol nebulizer treatments. Sputum culture revealed normal respiratory flora. She was followed by pulmonary/Dr. Luan Pulling while hospitalized. She received IVIG after having a low IgG level. She had a CT scan of the chest which revealed no evidence of pulmonary embolus.  Hyperglycemia was managed with Lantus and NovoLog in addition to metformin, glimepiride and Victoza.  Respiratory status gradually improved and she was felt to be stable for discharge on the morning of May 21. She'll be seen in follow-up in one week. Discharge exam reveals clear lung fields regular heart rhythm in the 80 range and no peripheral edema.  She is being tapered to an oral prednisone course and oral levofloxacin.   Discharge Instructions  Discharge Instructions    Diet - low sodium heart healthy    Complete by:  As directed    Increase activity slowly    Complete by:  As directed      Allergies as of 09/30/2016      Reactions   Sulfa Antibiotics Anaphylaxis      Medication List    STOP taking these medications   doxycycline 100 MG tablet Commonly known as:  VIBRA-TABS   meloxicam 15 MG tablet Commonly known as:  MOBIC   nystatin 100000 UNIT/ML suspension Commonly known as:  MYCOSTATIN     TAKE these medications    aspirin EC 81 MG tablet Take 1 tablet by mouth on Monday, Wednesday, and Friday   carvedilol 25 MG tablet Commonly known as:  COREG Take 25 mg by mouth 2 (two) times daily with a meal.   cetirizine 10 MG tablet Commonly known as:  ZYRTEC Take 10 mg by mouth at bedtime.   glimepiride 2 MG tablet Commonly known as:  AMARYL Take 2 mg by mouth daily.   guaiFENesin 600 MG 12 hr tablet Commonly known as:  MUCINEX Take 1 tablet (600 mg total) by mouth 2 (two) times daily.   hydrochlorothiazide 25 MG tablet Commonly known as:  HYDRODIURIL Take 25 mg by mouth daily.   insulin glargine 100 UNIT/ML injection Commonly known as:  LANTUS Inject 0.15 mLs (15 Units total) into the skin daily.   ipratropium-albuterol 0.5-2.5 (3) MG/3ML Soln Commonly known as:  DUONEB Take 3 mLs by nebulization 3 (three) times daily.   levofloxacin 750 MG tablet Commonly known as:  LEVAQUIN Take 1 tablet (750 mg total) by mouth daily at 6 PM.   liothyronine 5 MCG tablet Commonly known as:  CYTOMEL Take 5 mcg by mouth daily.   metFORMIN 500 MG 24 hr tablet Commonly known as:  GLUCOPHAGE-XR Take 1,000 mg by mouth 2 (two) times daily.   pantoprazole 40 MG tablet Commonly known as:  PROTONIX Take 1 tablet (40 mg total) by mouth 2 (two) times daily before a meal.   pravastatin 10 MG tablet Commonly known as:  PRAVACHOL Take 10 mg by mouth every evening.   predniSONE 10 MG tablet Commonly known as:  DELTASONE 4 daily for 3 days, 3 daily for 3 days, 2 daily for 3 days,1 daily for 3 days, one half daily for 3 days   traMADol 50 MG tablet Commonly known as:  ULTRAM Take 50-100 mg by mouth every 6 (six) hours as needed (for pain.). Reported on 09/28/2015   VENTOLIN HFA 108 (90 Base) MCG/ACT inhaler Generic drug:  albuterol Inhale 2 puffs into the lungs every 6 (six) hours as needed for wheezing or shortness of breath. What changed:  Another medication with the same name was removed. Continue taking  this medication, and follow the directions you see here.   VICTOZA 18 MG/3ML Sopn Generic drug:  liraglutide Inject 1.2 mg into the skin at bedtime.        Kathy Tran 09/30/2016

## 2016-09-30 NOTE — Progress Notes (Signed)
Discharge instructions read to patient and her husband. Both verbalized understanding of all instructions. Discharged to home with husband 

## 2016-09-30 NOTE — Progress Notes (Signed)
Inpatient Diabetes Program Recommendations  AACE/ADA: New Consensus Statement on Inpatient Glycemic Control (2015)  Target Ranges:  Prepandial:   less than 140 mg/dL      Peak postprandial:   less than 180 mg/dL (1-2 hours)      Critically ill patients:  140 - 180 mg/dL   Results for Kathy Tran, Kathy Tran (MRN [id number]) as of 09/30/2016 07:37  Ref. Range 09/29/2016 08:00 09/29/2016 11:38 09/29/2016 16:30 09/29/2016 21:18 09/30/2016 07:30  Glucose-Capillary Latest Ref Range: 65 - 99 mg/dL 216 (H) 280 (H) 157 (H) 266 (H) 306 (H)   Review of Glycemic Control  Diabetes history: DM2 Outpatient Diabetes medications: Amaryl 2 mg daily, Metformin XR 1000 mg BID, Victoza 1.2 mg QHS Current orders for Inpatient glycemic control: Lantus 15 units daily, Novolog 5 units TID with meals, Novolog 0-20 units TID with meals, Novolog 0-5 QHS, Victoza 1.2 mg daily, Amaryl 2 mg QAM, Metformin XR 1000 mg BID  Inpatient Diabetes Program Recommendations: Insulin-Basal: If steroids are continued as ordered, please consider increasing Lantus to 18 units daily. Insulin-Meal Coverage: If steroids are continue as ordered, please consider increasing meal coverage to Novolog 8 units TID with meals.  Thanks, Barnie Alderman, RN, MSN, CDE Diabetes Coordinator Inpatient Diabetes Program [phone] (Team Pager from 8am to 5pm)

## 2016-10-08 DIAGNOSIS — E119 Type 2 diabetes mellitus without complications: Secondary | ICD-10-CM | POA: Diagnosis not present

## 2016-10-08 DIAGNOSIS — R05 Cough: Secondary | ICD-10-CM | POA: Diagnosis not present

## 2016-10-09 DIAGNOSIS — J45901 Unspecified asthma with (acute) exacerbation: Secondary | ICD-10-CM | POA: Diagnosis not present

## 2016-10-09 DIAGNOSIS — J40 Bronchitis, not specified as acute or chronic: Secondary | ICD-10-CM | POA: Diagnosis not present

## 2016-10-09 DIAGNOSIS — I10 Essential (primary) hypertension: Secondary | ICD-10-CM | POA: Diagnosis not present

## 2016-10-10 DIAGNOSIS — J45909 Unspecified asthma, uncomplicated: Secondary | ICD-10-CM | POA: Diagnosis not present

## 2016-10-10 DIAGNOSIS — I10 Essential (primary) hypertension: Secondary | ICD-10-CM | POA: Diagnosis not present

## 2016-10-10 DIAGNOSIS — J40 Bronchitis, not specified as acute or chronic: Secondary | ICD-10-CM | POA: Diagnosis not present

## 2016-10-16 DIAGNOSIS — J45901 Unspecified asthma with (acute) exacerbation: Secondary | ICD-10-CM | POA: Diagnosis not present

## 2016-10-16 DIAGNOSIS — J40 Bronchitis, not specified as acute or chronic: Secondary | ICD-10-CM | POA: Diagnosis not present

## 2016-10-16 DIAGNOSIS — I10 Essential (primary) hypertension: Secondary | ICD-10-CM | POA: Diagnosis not present

## 2016-10-22 ENCOUNTER — Ambulatory Visit (HOSPITAL_COMMUNITY)
Admission: RE | Admit: 2016-10-22 | Discharge: 2016-10-22 | Disposition: A | Discharge status: Home / Self Care | Payer: 59 | Source: Ambulatory Visit | Attending: Internal Medicine | Admitting: Internal Medicine

## 2016-10-22 ENCOUNTER — Other Ambulatory Visit (HOSPITAL_COMMUNITY): Payer: Self-pay | Admitting: Internal Medicine

## 2016-10-22 DIAGNOSIS — R05 Cough: Secondary | ICD-10-CM | POA: Diagnosis present

## 2016-10-22 DIAGNOSIS — R062 Wheezing: Secondary | ICD-10-CM

## 2016-10-22 DIAGNOSIS — R0602 Shortness of breath: Secondary | ICD-10-CM | POA: Diagnosis not present

## 2016-10-22 DIAGNOSIS — R059 Cough, unspecified: Secondary | ICD-10-CM

## 2016-10-22 DIAGNOSIS — J209 Acute bronchitis, unspecified: Secondary | ICD-10-CM | POA: Diagnosis not present

## 2016-10-23 DIAGNOSIS — R05 Cough: Secondary | ICD-10-CM | POA: Diagnosis not present

## 2016-10-28 DIAGNOSIS — J45901 Unspecified asthma with (acute) exacerbation: Secondary | ICD-10-CM | POA: Diagnosis not present

## 2016-10-28 DIAGNOSIS — J40 Bronchitis, not specified as acute or chronic: Secondary | ICD-10-CM | POA: Diagnosis not present

## 2016-10-28 DIAGNOSIS — I10 Essential (primary) hypertension: Secondary | ICD-10-CM | POA: Diagnosis not present

## 2016-10-29 NOTE — Patient Instructions (Signed)
Kathy Tran  10/29/2016     _0 @   Your procedure is scheduled on  10/31/2016  Report to [facility] at  615  A.M.  Call this number if you have problems the morning of surgery:  [phone]   Remember:  Do not eat food or drink liquids after midnight.  Take these medicines the morning of surgery with A SIP OF WATER  Coreg, zyrtec, protonix, deltasone, ultram. Use your nebulizer and your inhaler before you come. Take 1/2 of your usual insulin dosage the night before your procedure. DO NOT take any mediations for diabetes the morning of your procedure.   Do not wear jewelry, make-up or nail polish.  Do not wear lotions, powders, or perfumes, or deoderant.  Do not shave 48 hours prior to surgery.  Men may shave face and neck.  Do not bring valuables to the hospital.  [facility] is not responsible for any belongings or valuables.  Contacts, dentures or bridgework may not be worn into surgery.  Leave your suitcase in the car.  After surgery it may be brought to your room.  For patients admitted to the hospital, discharge time will be determined by your treatment team.  Patients discharged the day of surgery will not be allowed to drive home.   Name and phone number of your driver:   family Special instructions:  None  Please read over the following fact sheets that you were given. Anesthesia Post-op Instructions and Care and Recovery After Surgery      Flexible Bronchoscopy Bronchoscopy is a procedure used to examine the passageways in the lungs. During the procedure a thin, flexible tool with a lens and camera or eyepiece is passed in your mouth or nose, down the windpipe (trachea), and into the air tubes (bronchi). This tool allows your health care provider to carefully look at your lungs from the inside and take diagnostic samples if needed. Tell a health care provider about:  Allergies to food or medicine.  All medicines you are taking,  including blood thinners, vitamins, herbs, eye drops, creams, and over-the-counter medicines.  Any problems you or family members have had with anesthetic medicines.  Any blood disorders you have.  Any surgeries you have had.  Medical conditions you have, including heart disease, diabetes, or kidney problems.  Possibility of pregnancy, if this applies. What are the risks? Generally, this is a safe procedure. However, as with any procedure, problems can occur. Possible problems include:  Collapsed lung (pneumothorax).  Bleeding.  Increased need for oxygen or difficulty breathing after the procedure.  What happens before the procedure? Do not eat or drink anything after midnight on the night before the procedure or as directed by your health care provider. What happens during the procedure?  Relax as much as possible during the procedure.  Medicines may be given to relax you, dry up your secretions, and control coughing.  A numbing medicine (local anesthetic) will be given to numb your mouth, nose, throat, and voice box (larynx). You will be able to breathe normally during the procedure.  Samples of airway secretions may be collected for testing.  If abnormal areas are seen in your airways, tissue samples may be taken for examination under a microscope (biopsy).  If tissue samples are needed from the outer portions of the lung, a type of X-ray called fluoroscopy may be done.  If bleeding occurs, a drug may be used to stop  or decrease the bleeding. What happens after the procedure?  You may receive a chest X-ray following the procedure. This is to make sure the lungs have not collapsed (pneumothorax). This information is not intended to replace advice given to you by your health care provider. Make sure you discuss any questions you have with your health care provider. Document Released: 04/26/2000 Document Revised: 10/05/2015 Document Reviewed: 01/01/2013 Elsevier Interactive  Patient Education  2017 [employer].  Flexible Bronchoscopy, Care After These instructions give you information on caring for yourself after your procedure. Your doctor may also give you more specific instructions. Call your doctor if you have any problems or questions after your procedure. Follow these instructions at home:  Do not eat or drink anything for 2 hours after your procedure. If you try to eat or drink before the medicine wears off, food or drink could go into your lungs. You could also burn yourself.  After 2 hours have passed and when you can cough and gag normally, you may eat soft food and drink liquids slowly.  The day after the test, you may eat your normal diet.  You may do your normal activities.  Keep all doctor visits. Get help right away if:  You get more and more short of breath.  You get light-headed.  You feel like you are going to pass out (faint).  You have chest pain.  You have new problems that worry you.  You cough up more than a little blood.  You cough up more blood than before. This information is not intended to replace advice given to you by your health care provider. Make sure you discuss any questions you have with your health care provider. Document Released: 02/24/2009 Document Revised: 10/05/2015 Document Reviewed: 01/01/2013 Elsevier Interactive Patient Education  2017 [place] Anesthesia is a term that refers to techniques, procedures, and medicines that help a person stay safe and comfortable during a medical procedure. Monitored anesthesia care, or sedation, is one type of anesthesia. Your anesthesia specialist may recommend sedation if you will be having a procedure that does not require you to be unconscious, such as:  Cataract surgery.  A dental procedure.  A biopsy.  A colonoscopy.  During the procedure, you may receive a medicine to help you relax (sedative). There are three levels of  sedation:  Mild sedation. At this level, you may feel awake and relaxed. You will be able to follow directions.  Moderate sedation. At this level, you will be sleepy. You may not remember the procedure.  Deep sedation. At this level, you will be asleep. You will not remember the procedure.  The more medicine you are given, the deeper your level of sedation will be. Depending on how you respond to the procedure, the anesthesia specialist may change your level of sedation or the type of anesthesia to fit your needs. An anesthesia specialist will monitor you closely during the procedure. Let your health care provider know about:  Any allergies you have.  All medicines you are taking, including vitamins, herbs, eye drops, creams, and over-the-counter medicines.  Any use of steroids (by mouth or as a cream).  Any problems you or family members have had with sedatives and anesthetic medicines.  Any blood disorders you have.  Any surgeries you have had.  Any medical conditions you have, such as sleep apnea.  Whether you are pregnant or may be pregnant.  Any use of cigarettes, alcohol, or street drugs. What  are the risks? Generally, this is a safe procedure. However, problems may occur, including:  Getting too much medicine (oversedation).  Nausea.  Allergic reaction to medicines.  Trouble breathing. If this happens, a breathing tube may be used to help with breathing. It will be removed when you are awake and breathing on your own.  Heart trouble.  Lung trouble.  Before the procedure Staying hydrated Follow instructions from your health care provider about hydration, which may include:  Up to 2 hours before the procedure - you may continue to drink clear liquids, such as water, clear fruit juice, black coffee, and plain tea.  Eating and drinking restrictions Follow instructions from your health care provider about eating and drinking, which may include:  8 hours before  the procedure - stop eating heavy meals or foods such as meat, fried foods, or fatty foods.  6 hours before the procedure - stop eating light meals or foods, such as toast or cereal.  6 hours before the procedure - stop drinking milk or drinks that contain milk.  2 hours before the procedure - stop drinking clear liquids.  Medicines Ask your health care provider about:  Changing or stopping your regular medicines. This is especially important if you are taking diabetes medicines or blood thinners.  Taking medicines such as aspirin and ibuprofen. These medicines can thin your blood. Do not take these medicines before your procedure if your health care provider instructs you not to.  Tests and exams  You will have a physical exam.  You may have blood tests done to show: ? How well your kidneys and liver are working. ? How well your blood can clot.  General instructions  Plan to have someone take you home from the hospital or clinic.  If you will be going home right after the procedure, plan to have someone with you for 24 hours.  What happens during the procedure?  Your blood pressure, heart rate, breathing, level of pain and overall condition will be monitored.  An IV tube will be inserted into one of your veins.  Your anesthesia specialist will give you medicines as needed to keep you comfortable during the procedure. This may mean changing the level of sedation.  The procedure will be performed. After the procedure  Your blood pressure, heart rate, breathing rate, and blood oxygen level will be monitored until the medicines you were given have worn off.  Do not drive for 24 hours if you received a sedative.  You may: ? Feel sleepy, clumsy, or nauseous. ? Feel forgetful about what happened after the procedure. ? Have a sore throat if you had a breathing tube during the procedure. ? Vomit. This information is not intended to replace advice given to you by your health  care provider. Make sure you discuss any questions you have with your health care provider. Document Released: 01/23/2005 Document Revised: 10/06/2015 Document Reviewed: 08/20/2015 Elsevier Interactive Patient Education  2018 [place], Care After These instructions provide you with information about caring for yourself after your procedure. Your health care provider may also give you more specific instructions. Your treatment has been planned according to current medical practices, but problems sometimes occur. Call your health care provider if you have any problems or questions after your procedure. What can I expect after the procedure? After your procedure, it is common to:  Feel sleepy for several hours.  Feel clumsy and have poor balance for several hours.  Feel forgetful about  what happened after the procedure.  Have poor judgment for several hours.  Feel nauseous or vomit.  Have a sore throat if you had a breathing tube during the procedure.  Follow these instructions at home: For at least 24 hours after the procedure:   Do not: ? Participate in activities in which you could fall or become injured. ? Drive. ? Use heavy machinery. ? Drink alcohol. ? Take sleeping pills or medicines that cause drowsiness. ? Make important decisions or sign legal documents. ? Take care of children on your own.  Rest. Eating and drinking  Follow the diet that is recommended by your health care provider.  If you vomit, drink water, juice, or soup when you can drink without vomiting.  Make sure you have little or no nausea before eating solid foods. General instructions  Have a responsible adult stay with you until you are awake and alert.  Take over-the-counter and prescription medicines only as told by your health care provider.  If you smoke, do not smoke without supervision.  Keep all follow-up visits as told by your health care provider. This is  important. Contact a health care provider if:  You keep feeling nauseous or you keep vomiting.  You feel light-headed.  You develop a rash.  You have a fever. Get help right away if:  You have trouble breathing. This information is not intended to replace advice given to you by your health care provider. Make sure you discuss any questions you have with your health care provider. Document Released: 08/20/2015 Document Revised: 12/20/2015 Document Reviewed: 08/20/2015 Elsevier Interactive Patient Education  [employer].

## 2016-10-30 ENCOUNTER — Encounter (HOSPITAL_COMMUNITY)
Admission: RE | Admit: 2016-10-30 | Discharge: 2016-10-30 | Disposition: A | Discharge status: Home / Self Care | Payer: 59 | Source: Ambulatory Visit | Attending: Pulmonary Disease | Admitting: Pulmonary Disease

## 2016-10-30 ENCOUNTER — Encounter (HOSPITAL_COMMUNITY): Payer: Self-pay

## 2016-10-30 DIAGNOSIS — I10 Essential (primary) hypertension: Secondary | ICD-10-CM | POA: Diagnosis not present

## 2016-10-30 DIAGNOSIS — R05 Cough: Secondary | ICD-10-CM | POA: Diagnosis not present

## 2016-10-30 DIAGNOSIS — E119 Type 2 diabetes mellitus without complications: Secondary | ICD-10-CM | POA: Diagnosis not present

## 2016-10-30 DIAGNOSIS — Z809 Family history of malignant neoplasm, unspecified: Secondary | ICD-10-CM | POA: Diagnosis not present

## 2016-10-30 DIAGNOSIS — Z825 Family history of asthma and other chronic lower respiratory diseases: Secondary | ICD-10-CM | POA: Diagnosis not present

## 2016-10-30 DIAGNOSIS — Z87442 Personal history of urinary calculi: Secondary | ICD-10-CM | POA: Diagnosis not present

## 2016-10-30 DIAGNOSIS — J449 Chronic obstructive pulmonary disease, unspecified: Secondary | ICD-10-CM | POA: Diagnosis not present

## 2016-10-30 DIAGNOSIS — Z794 Long term (current) use of insulin: Secondary | ICD-10-CM | POA: Diagnosis not present

## 2016-10-30 DIAGNOSIS — Z79899 Other long term (current) drug therapy: Secondary | ICD-10-CM | POA: Diagnosis not present

## 2016-10-30 DIAGNOSIS — Z7982 Long term (current) use of aspirin: Secondary | ICD-10-CM | POA: Diagnosis not present

## 2016-10-30 DIAGNOSIS — Z882 Allergy status to sulfonamides status: Secondary | ICD-10-CM | POA: Diagnosis not present

## 2016-10-30 DIAGNOSIS — R011 Cardiac murmur, unspecified: Secondary | ICD-10-CM | POA: Diagnosis not present

## 2016-10-30 HISTORY — DX: Unspecified asthma, uncomplicated: J45.909

## 2016-10-30 LAB — CBC
HEMATOCRIT: 43.1 % (ref 36.0–46.0)
HEMOGLOBIN: 14.2 g/dL (ref 12.0–15.0)
MCH: 32.1 pg (ref 26.0–34.0)
MCHC: 32.9 g/dL (ref 30.0–36.0)
MCV: 97.5 fL (ref 78.0–100.0)
Platelets: 228 10*3/uL (ref 150–400)
RBC: 4.42 MIL/uL (ref 3.87–5.11)
RDW: 12.5 % (ref 11.5–15.5)
WBC: 8.8 10*3/uL (ref 4.0–10.5)

## 2016-10-30 LAB — BASIC METABOLIC PANEL
Anion gap: 15 (ref 5–15)
BUN: 19 mg/dL (ref 6–20)
CHLORIDE: 97 mmol/L — AB (ref 101–111)
CO2: 21 mmol/L — AB (ref 22–32)
CREATININE: 0.94 mg/dL (ref 0.44–1.00)
Calcium: 9.5 mg/dL (ref 8.9–10.3)
GFR calc Af Amer: >60 mL/min (ref >60)
GFR calc non Af Amer: >60 mL/min (ref >60)
Glucose, Bld: 483 mg/dL — ABNORMAL HIGH (ref 65–99)
POTASSIUM: 3.9 mmol/L (ref 3.5–5.1)
Sodium: 133 mmol/L — ABNORMAL LOW (ref 135–145)

## 2016-10-30 NOTE — Progress Notes (Signed)
Dr Patsey Berthold and Luan Pulling made aware of blood sugar of 483.  Per Dr Luan Pulling, advised patient to cover with her insulin as she would normally do in the am and report what she received when she arrives.  Patient understands instructions.

## 2016-10-31 ENCOUNTER — Ambulatory Visit (HOSPITAL_COMMUNITY): Payer: 59 | Admitting: Anesthesiology

## 2016-10-31 ENCOUNTER — Ambulatory Visit (HOSPITAL_COMMUNITY)
Admission: RE | Admit: 2016-10-31 | Discharge: 2016-10-31 | Disposition: A | Discharge status: Home / Self Care | Payer: 59 | Source: Ambulatory Visit | Attending: Pulmonary Disease | Admitting: Pulmonary Disease

## 2016-10-31 ENCOUNTER — Encounter (HOSPITAL_COMMUNITY)
Admission: RE | Disposition: A | Discharge status: Home / Self Care | Payer: Self-pay | Source: Ambulatory Visit | Attending: Pulmonary Disease

## 2016-10-31 DIAGNOSIS — Z809 Family history of malignant neoplasm, unspecified: Secondary | ICD-10-CM | POA: Insufficient documentation

## 2016-10-31 DIAGNOSIS — J449 Chronic obstructive pulmonary disease, unspecified: Secondary | ICD-10-CM | POA: Insufficient documentation

## 2016-10-31 DIAGNOSIS — Z882 Allergy status to sulfonamides status: Secondary | ICD-10-CM | POA: Insufficient documentation

## 2016-10-31 DIAGNOSIS — Z825 Family history of asthma and other chronic lower respiratory diseases: Secondary | ICD-10-CM | POA: Insufficient documentation

## 2016-10-31 DIAGNOSIS — R011 Cardiac murmur, unspecified: Secondary | ICD-10-CM | POA: Insufficient documentation

## 2016-10-31 DIAGNOSIS — R05 Cough: Secondary | ICD-10-CM | POA: Diagnosis not present

## 2016-10-31 DIAGNOSIS — R918 Other nonspecific abnormal finding of lung field: Secondary | ICD-10-CM | POA: Diagnosis not present

## 2016-10-31 DIAGNOSIS — Z87442 Personal history of urinary calculi: Secondary | ICD-10-CM | POA: Insufficient documentation

## 2016-10-31 DIAGNOSIS — R911 Solitary pulmonary nodule: Secondary | ICD-10-CM | POA: Diagnosis not present

## 2016-10-31 DIAGNOSIS — Z7982 Long term (current) use of aspirin: Secondary | ICD-10-CM | POA: Insufficient documentation

## 2016-10-31 DIAGNOSIS — E119 Type 2 diabetes mellitus without complications: Secondary | ICD-10-CM | POA: Diagnosis not present

## 2016-10-31 DIAGNOSIS — I10 Essential (primary) hypertension: Secondary | ICD-10-CM | POA: Insufficient documentation

## 2016-10-31 DIAGNOSIS — J209 Acute bronchitis, unspecified: Secondary | ICD-10-CM | POA: Diagnosis not present

## 2016-10-31 DIAGNOSIS — Z794 Long term (current) use of insulin: Secondary | ICD-10-CM | POA: Insufficient documentation

## 2016-10-31 DIAGNOSIS — Z79899 Other long term (current) drug therapy: Secondary | ICD-10-CM | POA: Insufficient documentation

## 2016-10-31 HISTORY — PX: FLEXIBLE BRONCHOSCOPY: SHX5094

## 2016-10-31 LAB — GLUCOSE, CAPILLARY
GLUCOSE-CAPILLARY: 119 mg/dL — AB (ref 65–99)
Glucose-Capillary: 111 mg/dL — ABNORMAL HIGH (ref 65–99)

## 2016-10-31 SURGERY — BRONCHOSCOPY, FLEXIBLE
Anesthesia: Monitor Anesthesia Care | Laterality: Bilateral

## 2016-10-31 MED ORDER — FENTANYL CITRATE (PF) 100 MCG/2ML IJ SOLN
INTRAMUSCULAR | Status: AC
  Filled 2016-10-31: qty 2

## 2016-10-31 MED ORDER — GLYCOPYRROLATE 0.2 MG/ML IJ SOLN
0.2000 mg | Freq: Once | INTRAMUSCULAR | Status: AC | PRN
  Administered 2016-10-31: 0.2 mg via INTRAVENOUS

## 2016-10-31 MED ORDER — LIDOCAINE VISCOUS 2 % MT SOLN
OROMUCOSAL | Status: AC
  Filled 2016-10-31: qty 15

## 2016-10-31 MED ORDER — PROPOFOL 10 MG/ML IV BOLUS
INTRAVENOUS | Status: DC | PRN
  Administered 2016-10-31 (×2): 20 mg via INTRAVENOUS

## 2016-10-31 MED ORDER — PROPOFOL 10 MG/ML IV BOLUS
INTRAVENOUS | Status: AC
  Filled 2016-10-31: qty 20

## 2016-10-31 MED ORDER — MIDAZOLAM HCL 2 MG/2ML IJ SOLN
1.0000 mg | INTRAMUSCULAR | Status: AC
  Administered 2016-10-31: 2 mg via INTRAVENOUS

## 2016-10-31 MED ORDER — LIDOCAINE HCL (PF) 2 % IJ SOLN
INTRAMUSCULAR | Status: DC | PRN
Start: 2016-10-31 — End: 2016-10-31
  Administered 2016-10-31: 18 mL

## 2016-10-31 MED ORDER — LIDOCAINE HCL (PF) 2 % IJ SOLN
INTRAMUSCULAR | Status: AC
  Filled 2016-10-31: qty 4

## 2016-10-31 MED ORDER — MIDAZOLAM HCL 2 MG/2ML IJ SOLN
INTRAMUSCULAR | Status: AC
  Filled 2016-10-31: qty 2

## 2016-10-31 MED ORDER — IPRATROPIUM-ALBUTEROL 0.5-2.5 (3) MG/3ML IN SOLN
RESPIRATORY_TRACT | Status: AC
  Filled 2016-10-31: qty 3

## 2016-10-31 MED ORDER — LACTATED RINGERS IV SOLN
INTRAVENOUS | Status: DC
  Administered 2016-10-31: 07:00:00 via INTRAVENOUS

## 2016-10-31 MED ORDER — FENTANYL CITRATE (PF) 100 MCG/2ML IJ SOLN
25.0000 ug | Freq: Once | INTRAMUSCULAR | Status: AC
  Administered 2016-10-31: 25 ug via INTRAVENOUS

## 2016-10-31 MED ORDER — LIDOCAINE VISCOUS 2 % MT SOLN
OROMUCOSAL | Status: DC | PRN
  Administered 2016-10-31: 4 mL via OROMUCOSAL

## 2016-10-31 MED ORDER — GLYCOPYRROLATE 0.2 MG/ML IJ SOLN
INTRAMUSCULAR | Status: AC
  Filled 2016-10-31: qty 1

## 2016-10-31 MED ORDER — 0.9 % SODIUM CHLORIDE (POUR BTL) OPTIME
TOPICAL | Status: DC | PRN
  Administered 2016-10-31: 60 mL

## 2016-10-31 MED ORDER — LIDOCAINE HCL (PF) 2 % IJ SOLN
INTRAMUSCULAR | Status: AC
  Filled 2016-10-31: qty 20

## 2016-10-31 MED ORDER — IPRATROPIUM-ALBUTEROL 0.5-2.5 (3) MG/3ML IN SOLN
3.0000 mL | Freq: Once | RESPIRATORY_TRACT | Status: AC
  Administered 2016-10-31: 3 mL via RESPIRATORY_TRACT

## 2016-10-31 MED ORDER — PROPOFOL 500 MG/50ML IV EMUL
INTRAVENOUS | Status: DC | PRN
  Administered 2016-10-31: 100 ug/kg/min via INTRAVENOUS

## 2016-10-31 MED ORDER — BUTAMBEN-TETRACAINE-BENZOCAINE 2-2-14 % EX AERO
INHALATION_SPRAY | CUTANEOUS | Status: AC
  Filled 2016-10-31: qty 5

## 2016-10-31 SURGICAL SUPPLY — 16 items
BRUSH CYTOL CELLEBRITY 1.5X140 (MISCELLANEOUS) ×2 IMPLANT
CLOTH BEACON ORANGE TIMEOUT ST (SAFETY) ×2 IMPLANT
CONNECTOR 5 IN 1 STRAIGHT STRL (MISCELLANEOUS) ×2 IMPLANT
FORCEPS BIOP RJ4 1.8 (CUTTING FORCEPS) ×2 IMPLANT
GLOVE BIO SURGEON STRL SZ7.5 (GLOVE) ×2 IMPLANT
KIT CLEAN CATCH URINE (SET/KITS/TRAYS/PACK) IMPLANT
MARKER SKIN DUAL TIP RULER LAB (MISCELLANEOUS) ×2 IMPLANT
NS IRRIG 1000ML POUR BTL (IV SOLUTION) ×2 IMPLANT
SPONGE GAUZE 4X4 12PLY (GAUZE/BANDAGES/DRESSINGS) ×2 IMPLANT
SYR 20CC LL (SYRINGE) ×2 IMPLANT
SYR 30ML LL (SYRINGE) ×2 IMPLANT
SYR CONTROL 10ML LL (SYRINGE) ×2 IMPLANT
TRAP SPECIMEN CP (MISCELLANEOUS) ×2 IMPLANT
VALVE DISPOSABLE (MISCELLANEOUS) ×2 IMPLANT
WATER STERILE IRR 1000ML POUR (IV SOLUTION) ×2 IMPLANT
YANKAUER SUCT BULB TIP 10FT TU (MISCELLANEOUS) ×4 IMPLANT

## 2016-10-31 NOTE — Anesthesia Preprocedure Evaluation (Signed)
Anesthesia Evaluation  Patient identified by MRN, date of birth, ID band Patient awake    Reviewed: Allergy & Precautions, NPO status , Patient's Chart, lab work & pertinent test results  Airway Mallampati: I  TM Distance: >3 FB Neck ROM: Full    Dental  (+) Teeth Intact   Pulmonary asthma ,  Pulmonary nodules   breath sounds clear to auscultation       Cardiovascular hypertension, Pt. on home beta blockers and Pt. on medications  Rhythm:Regular Rate:Normal     Neuro/Psych negative neurological ROS  negative psych ROS   GI/Hepatic GERD  Controlled and Medicated,  Endo/Other  diabetes, Poorly Controlled, Type 2, Oral Hypoglycemic Agents  Renal/GU      Musculoskeletal   Abdominal   Peds  Hematology   Anesthesia Other Findings   Reproductive/Obstetrics                             Anesthesia Physical Anesthesia Plan  ASA: III  Anesthesia Plan: MAC   Post-op Pain Management:    Induction: Intravenous  PONV Risk Score and Plan:   Airway Management Planned: Simple Face Mask  Additional Equipment:   Intra-op Plan:   Post-operative Plan:   Informed Consent: I have reviewed the patients History and Physical, chart, labs and discussed the procedure including the risks, benefits and alternatives for the proposed anesthesia with the patient or authorized representative who has indicated his/her understanding and acceptance.     Plan Discussed with:   Anesthesia Plan Comments:         Anesthesia Quick Evaluation  

## 2016-10-31 NOTE — Anesthesia Postprocedure Evaluation (Addendum)
Anesthesia Post Note  Patient: Kathy Tran  Procedure(s) Performed: Procedure(s) (LRB): FLEXIBLE BRONCHOSCOPY WITH PROPOFOL (Bilateral)  Patient location during evaluation: PACU Anesthesia Type: MAC Level of consciousness: awake and alert Pain management: satisfactory to patient Respiratory status: spontaneous breathing Cardiovascular status: stable Postop Assessment: no signs of nausea or vomiting Anesthetic complications: no     Last Vitals:  Vitals:   10/31/16 0815 10/31/16 0830  BP: 125/83 133/84  Pulse: 95 97  Resp: 17 14  Temp:      Last Pain:  Vitals:   10/31/16 0655  TempSrc: Oral                 Prynce Jacober

## 2016-10-31 NOTE — Anesthesia Procedure Notes (Addendum)
Procedure Name: MAC Date/Time: 10/31/2016 7:35 AM Performed by: Vista Deck Pre-anesthesia Checklist: Patient identified, Emergency Drugs available, Suction available, Timeout performed and Patient being monitored Patient Re-evaluated:Patient Re-evaluated prior to inductionOxygen Delivery Method: Non-rebreather mask

## 2016-10-31 NOTE — H&P (Signed)
Kathy Tran MRN: [id number] DOB/AGE: [birth year] 61 y.o. Primary Care Physician:Fagan, Carloyn Manner, MD Admit date: 10/31/2016 Chief Complaint: Cough/wheezing HPI: This is a 61 year old who's had an approximately 3-4 month history of productive cough. She was in the hospital with COPD chronic bronchitis/asthma and was treated with IV antibiotics IV steroids and improved but then her symptoms restarted after she went home. She's been on off antibiotics for months and and still is coughing of sputum and still short of breath and wheezing. She is undergoing bronchoscopy to obtain specimens and to see if she has something else that is causing her not to clear. She says she has not had fever recently she's coughing up some yellowish-green sputum she is short of breath. No chest pain. No other new complaints.  Past Medical History:  Diagnosis Date  . Asthma    Flu march and May 2018  . Diabetes mellitus type 2, controlled ([place])    x5 yrs  . Heart murmur   . Hypertension    x 5 yrs  . Renal disorder    renal stones   Past Surgical History:  Procedure Laterality Date  . [place]  . COLONOSCOPY  03/27/2011   Procedure: COLONOSCOPY;  Surgeon: Rogene Houston, MD;  Location: [facility];  Service: Endoscopy;  Laterality: N/A;  2:00  . CYSTOSCOPY W/ URETERAL STENT PLACEMENT Left 11/08/2015   Procedure: CYSTOSCOPY, URETEROSCOPY, WITH STONE BASKET EXTRACTION;  Surgeon: Raynelle Bring, MD;  Location: [facility];  Service: Urology;  Laterality: Left;  . CYSTOSCOPY/RETROGRADE/URETEROSCOPY/STONE EXTRACTION WITH BASKET Right 07/03/2015   Procedure: CYSTOSCOPY/RETROGRADE/URETEROSCOPY/STONE EXTRACTION WITH BASKET;  Surgeon: Irine Seal, MD;  Location: [facility];  Service: Urology;  Laterality: Right;  . ENDOMETRIAL ABLATION    . KIDNEY STONE SURGERY     multiple  . SHOULDER SURGERY    . TONSILLECTOMY    . TUBAL LIGATION  1991        Family History  Problem Relation Age of Onset  . COPD Mother    . Cancer Father     Social History:  reports that she has never smoked. She has never used smokeless tobacco. She reports that she drinks alcohol. She reports that she does not use drugs.   Allergies:  Allergies  Allergen Reactions  . Sulfa Antibiotics Anaphylaxis    Medications Prior to Admission  Medication Sig Dispense Refill  . amoxicillin-clavulanate (AUGMENTIN) 875-125 MG tablet Take 1 tablet by mouth 2 (two) times daily.    Marland Kitchen aspirin EC 81 MG tablet Take 1 tablet by mouth on Monday, Wednesday, and Friday    . carvedilol (COREG) 25 MG tablet Take 25 mg by mouth 2 (two) times daily with a meal.    . cetirizine (ZYRTEC) 10 MG tablet Take 10 mg by mouth at bedtime.    Marland Kitchen glimepiride (AMARYL) 2 MG tablet Take 2 mg by mouth daily.    Marland Kitchen guaiFENesin (MUCINEX) 600 MG 12 hr tablet Take 1 tablet (600 mg total) by mouth 2 (two) times daily. 60 tablet 1  . hydrochlorothiazide (HYDRODIURIL) 25 MG tablet Take 25 mg by mouth daily.     . insulin detemir (LEVEMIR) 100 UNIT/ML injection Inject 15 Units into the skin every morning.    Marland Kitchen ipratropium-albuterol (DUONEB) 0.5-2.5 (3) MG/3ML SOLN Take 3 mLs by nebulization 3 (three) times daily. 360 mL 5  . liothyronine (CYTOMEL) 5 MCG tablet Take 5 mcg by mouth every morning.     . metFORMIN (GLUCOPHAGE-XR) 500 MG 24  hr tablet Take 1,000 mg by mouth 2 (two) times daily.    . pantoprazole (PROTONIX) 40 MG tablet Take 1 tablet (40 mg total) by mouth 2 (two) times daily before a meal. 30 tablet 2  . pravastatin (PRAVACHOL) 10 MG tablet Take 10 mg by mouth every evening.    . predniSONE (DELTASONE) 20 MG tablet Take 40 mg by mouth daily with breakfast.    . VENTOLIN HFA 108 (90 Base) MCG/ACT inhaler Inhale 2 puffs into the lungs every 4 (four) hours.     Marland Kitchen VICTOZA 18 MG/3ML SOPN Inject 1.2 mg into the skin at bedtime.          QMG:NOIBB from the symptoms mentioned above,there are no other symptoms referable to all systems reviewed.10 point review of  systems otherwise negative  Physical Exam: Blood pressure 128/74, pulse 95, temperature 97.7 F (36.5 C), temperature source Oral, resp. rate 18, SpO2 95 %. Constitutional: She is awake and alert and in no acute distress. Eyes: Pupils react ears nose mouth and throat: Her mucous membranes are moist. Cardiovascular: Her heart is regular with normal heart sounds. Respiratory: She has some end-expiratory wheezing but sounds a little better than she did in the office. Gastrointestinal: Her abdomen is soft with no masses. Skin: Warm and dry. Musculoskeletal: Normal strength. Neurological: No focal abnormalities psychiatric: Normal mood and affect    Recent Labs  10/30/16 1430  WBC 8.8  HGB 14.2  HCT 43.1  MCV 97.5  PLT 228    Recent Labs  10/30/16 1430  NA 133*  K 3.9  CL 97*  CO2 21*  GLUCOSE 483*  BUN 19  CREATININE 0.94  CALCIUM 9.5  lablast2(ast:2,ALT:2,alkphos:2,bilitot:2,prot:2,albumin:2)@    No results found for this or any previous visit (from the past 240 hour(s)).   Dg Chest 2 View  Result Date: 10/22/2016 CLINICAL DATA:  Cough and wheezing. Shortness of breath. High blood pressure. EXAM: CHEST  2 VIEW COMPARISON:  07/18/2016. FINDINGS: The heart size and mediastinal contours are within normal limits. Both lungs are clear. The visualized skeletal structures are unremarkable. IMPRESSION: No active cardiopulmonary disease. Electronically Signed   By: Staci Righter M.D.   On: 10/22/2016 15:07   Impression: She is here for bronchoscopy. She has had a prolonged exacerbation of asthma/bronchitis. Active Problems:   * No active hospital problems. *     Plan: For fiberoptic bronchoscopy with washings      Matisse Salais L   10/31/2016, 7:32 AM

## 2016-10-31 NOTE — Addendum Note (Signed)
Addendum  created 10/31/16 0902 by Vista Deck, CRNA   Charge Capture section accepted

## 2016-10-31 NOTE — Op Note (Signed)
Bronchoscopy Procedure Note Kathy Tran [id number] 30-Nov-1955  Procedure: Bronchoscopy Indications: Diagnostic evaluation of the airways, Obtain specimens for culture and/or other diagnostic studies and Remove secretions  Procedure Details Consent: Risks of procedure as well as the alternatives and risks of each were explained to the (patient/caregiver).  Consent for procedure obtained. Time Out: Verified patient identification, verified procedure, site/side was marked, verified correct patient position, special equipment/implants available, medications/allergies/relevent history reviewed, required imaging and test results available.  Performed  In preparation for procedure, patient was given 100% FiO2 and bronchoscope lubricated. Sedation: Propofol  Airway entered and the following bronchi were examined: RUL, RML, RLL, LUL, LLL and Bronchi.  She had copious secretions throughout. Her trachea and bronchi were erythematous and inflamed in appearance. No endobronchial lesions. Mucous plugs removed. Procedures performed: Brushings performed Bronchoscope removed.    Evaluation Hemodynamic Status: BP stable throughout; O2 sats: stable throughout Patient's Current Condition: stable Specimens:  Sent purulent fluid  Complications: No apparent complications Patient did tolerate procedure well.   Parrish Daddario L 10/31/2016

## 2016-10-31 NOTE — Transfer of Care (Signed)
Immediate Anesthesia Transfer of Care Note  Patient: LONDA MACKOWSKI  Procedure(s) Performed: Procedure(s): FLEXIBLE BRONCHOSCOPY WITH PROPOFOL (Bilateral)  Patient Location: PACU  Anesthesia Type:MAC  Level of Consciousness: sedated and patient cooperative  Airway & Oxygen Therapy: Patient Spontanous Breathing and non-rebreather face mask  Post-op Assessment: Report given to RN and Post -op Vital signs reviewed and stable  Post vital signs: Reviewed and stable  Last Vitals:  Vitals:   10/31/16 0655  BP: 128/74  Pulse: 95  Resp: 18  Temp: 36.5 C    Last Pain:  Vitals:   10/31/16 0655  TempSrc: Oral      Patients Stated Pain Goal: 5 (05/39/76 7341)  Complications: No apparent anesthesia complications

## 2016-10-31 NOTE — Discharge Instructions (Signed)
Flexible Bronchoscopy, Care After These instructions give you information on caring for yourself after your procedure. Your doctor may also give you more specific instructions. Call your doctor if you have any problems or questions after your procedure. Follow these instructions at home:  Do not eat or drink anything for 2 hours after your procedure. If you try to eat or drink before the medicine wears off, food or drink could go into your lungs. You could also burn yourself.  After 2 hours have passed and when you can cough and gag normally, you may eat soft food and drink liquids slowly.  The day after the test, you may eat your normal diet.  You may do your normal activities.  Keep all doctor visits. Get help right away if:  You get more and more short of breath.  You get light-headed.  You feel like you are going to pass out (faint).  You have chest pain.  You have new problems that worry you.  You cough up more than a little blood.  You cough up more blood than before. This information is not intended to replace advice given to you by your health care provider. Make sure you discuss any questions you have with your health care provider. Document Released: 02/24/2009 Document Revised: 10/05/2015 Document Reviewed: 01/01/2013 Elsevier Interactive Patient Education  2017 [employer].

## 2016-11-01 LAB — ACID FAST SMEAR (AFB): ACID FAST SMEAR - AFSCU2: NEGATIVE

## 2016-11-02 LAB — CULTURE, RESPIRATORY W GRAM STAIN: Culture: NORMAL

## 2016-11-05 ENCOUNTER — Encounter (HOSPITAL_COMMUNITY): Payer: Self-pay | Admitting: Pulmonary Disease

## 2016-11-05 DIAGNOSIS — E119 Type 2 diabetes mellitus without complications: Secondary | ICD-10-CM | POA: Diagnosis not present

## 2016-11-05 DIAGNOSIS — Z79899 Other long term (current) drug therapy: Secondary | ICD-10-CM | POA: Diagnosis not present

## 2016-11-05 DIAGNOSIS — I10 Essential (primary) hypertension: Secondary | ICD-10-CM | POA: Diagnosis not present

## 2016-11-05 DIAGNOSIS — I348 Other nonrheumatic mitral valve disorders: Secondary | ICD-10-CM | POA: Diagnosis not present

## 2016-11-12 ENCOUNTER — Encounter (INDEPENDENT_AMBULATORY_CARE_PROVIDER_SITE_OTHER): Payer: Self-pay | Admitting: *Deleted

## 2016-11-12 DIAGNOSIS — Z0001 Encounter for general adult medical examination with abnormal findings: Secondary | ICD-10-CM | POA: Diagnosis not present

## 2016-11-12 DIAGNOSIS — E119 Type 2 diabetes mellitus without complications: Secondary | ICD-10-CM | POA: Diagnosis not present

## 2016-11-12 DIAGNOSIS — R05 Cough: Secondary | ICD-10-CM | POA: Diagnosis not present

## 2016-11-22 DIAGNOSIS — I10 Essential (primary) hypertension: Secondary | ICD-10-CM | POA: Diagnosis not present

## 2016-11-22 DIAGNOSIS — E119 Type 2 diabetes mellitus without complications: Secondary | ICD-10-CM | POA: Diagnosis not present

## 2016-11-22 DIAGNOSIS — J40 Bronchitis, not specified as acute or chronic: Secondary | ICD-10-CM | POA: Diagnosis not present

## 2016-11-27 LAB — FUNGUS CULTURE WITH STAIN

## 2016-11-27 LAB — FUNGAL ORGANISM REFLEX

## 2016-11-27 LAB — FUNGUS CULTURE RESULT

## 2016-12-06 ENCOUNTER — Telehealth (INDEPENDENT_AMBULATORY_CARE_PROVIDER_SITE_OTHER): Payer: Self-pay | Admitting: *Deleted

## 2016-12-06 NOTE — Telephone Encounter (Signed)
Referring MD/PCP: fagan   Procedure: tcs  Reason/Indication:  Hx polyps  Has patient had this procedure before?  Yes, 2012             If so, when, by whom and where?    Is there a family history of colon cancer?  no             Who?  What age when diagnosed?    Is patient diabetic?   yes                                                  Does patient have prosthetic heart valve or mechanical valve?  no  Do you have a pacemaker?  no  Has patient ever had endocarditis? no  Has patient had joint replacement within last 12 months?  no  Does patient tend to be constipated or take laxatives? no  Does patient have a history of alcohol/drug use?  no  Is patient on Coumadin, Plavix and/or Aspirin? no  Medications: see epic  Allergies: see epic  Medication Adjustment per Dr Laural Golden: decrease amaryl to 5 mg day before, don't take metformin day before, don't take victosa day of procedure  Procedure date & time: 01/08/17 at 930

## 2016-12-10 DIAGNOSIS — M545 Low back pain: Secondary | ICD-10-CM | POA: Diagnosis not present

## 2016-12-10 DIAGNOSIS — N3001 Acute cystitis with hematuria: Secondary | ICD-10-CM | POA: Diagnosis not present

## 2016-12-10 DIAGNOSIS — R8271 Bacteriuria: Secondary | ICD-10-CM | POA: Diagnosis not present

## 2016-12-10 NOTE — Telephone Encounter (Signed)
agree

## 2016-12-12 DIAGNOSIS — E119 Type 2 diabetes mellitus without complications: Secondary | ICD-10-CM | POA: Diagnosis not present

## 2016-12-12 DIAGNOSIS — I10 Essential (primary) hypertension: Secondary | ICD-10-CM | POA: Diagnosis not present

## 2016-12-14 LAB — ACID FAST CULTURE WITH REFLEXED SENSITIVITIES

## 2016-12-14 LAB — ACID FAST CULTURE WITH REFLEXED SENSITIVITIES (MYCOBACTERIA): Acid Fast Culture: NEGATIVE

## 2016-12-17 DIAGNOSIS — N202 Calculus of kidney with calculus of ureter: Secondary | ICD-10-CM | POA: Diagnosis not present

## 2016-12-17 DIAGNOSIS — N200 Calculus of kidney: Secondary | ICD-10-CM | POA: Diagnosis not present

## 2016-12-19 DIAGNOSIS — R0602 Shortness of breath: Secondary | ICD-10-CM | POA: Diagnosis not present

## 2016-12-19 DIAGNOSIS — R062 Wheezing: Secondary | ICD-10-CM | POA: Diagnosis not present

## 2016-12-19 DIAGNOSIS — R05 Cough: Secondary | ICD-10-CM | POA: Diagnosis not present

## 2016-12-19 DIAGNOSIS — R000 Tachycardia, unspecified: Secondary | ICD-10-CM | POA: Diagnosis not present

## 2016-12-26 ENCOUNTER — Inpatient Hospital Stay (HOSPITAL_COMMUNITY): Payer: 59

## 2016-12-26 ENCOUNTER — Encounter (HOSPITAL_COMMUNITY): Payer: Self-pay | Admitting: *Deleted

## 2016-12-26 ENCOUNTER — Inpatient Hospital Stay (HOSPITAL_COMMUNITY)
Admission: AD | Admit: 2016-12-26 | Discharge: 2016-12-30 | DRG: 203 | Disposition: A | Discharge status: Home / Self Care | Payer: 59 | Source: Ambulatory Visit | Attending: Internal Medicine | Admitting: Internal Medicine

## 2016-12-26 DIAGNOSIS — J209 Acute bronchitis, unspecified: Secondary | ICD-10-CM | POA: Diagnosis not present

## 2016-12-26 DIAGNOSIS — J45901 Unspecified asthma with (acute) exacerbation: Secondary | ICD-10-CM | POA: Diagnosis not present

## 2016-12-26 DIAGNOSIS — J4551 Severe persistent asthma with (acute) exacerbation: Secondary | ICD-10-CM | POA: Diagnosis not present

## 2016-12-26 DIAGNOSIS — I10 Essential (primary) hypertension: Secondary | ICD-10-CM | POA: Diagnosis present

## 2016-12-26 DIAGNOSIS — R05 Cough: Secondary | ICD-10-CM | POA: Diagnosis not present

## 2016-12-26 DIAGNOSIS — Z882 Allergy status to sulfonamides status: Secondary | ICD-10-CM | POA: Diagnosis not present

## 2016-12-26 DIAGNOSIS — R0602 Shortness of breath: Secondary | ICD-10-CM | POA: Diagnosis not present

## 2016-12-26 DIAGNOSIS — E119 Type 2 diabetes mellitus without complications: Secondary | ICD-10-CM | POA: Diagnosis not present

## 2016-12-26 LAB — GLUCOSE, CAPILLARY
GLUCOSE-CAPILLARY: 296 mg/dL — AB (ref 65–99)
Glucose-Capillary: 242 mg/dL — ABNORMAL HIGH (ref 65–99)

## 2016-12-26 MED ORDER — IPRATROPIUM BROMIDE 0.02 % IN SOLN: 0.5000 mg | RESPIRATORY_TRACT | Status: DC

## 2016-12-26 MED ORDER — ENOXAPARIN SODIUM 40 MG/0.4ML ~~LOC~~ SOLN
40.0000 mg | SUBCUTANEOUS | Status: DC
  Administered 2016-12-28 – 2016-12-29 (×2): 40 mg via SUBCUTANEOUS
  Filled 2016-12-26 (×4): qty 0.4

## 2016-12-26 MED ORDER — ACETAMINOPHEN 650 MG RE SUPP: 650.0000 mg | Freq: Four times a day (QID) | RECTAL | Status: DC | PRN

## 2016-12-26 MED ORDER — ASPIRIN EC 81 MG PO TBEC: 81.0000 mg | DELAYED_RELEASE_TABLET | ORAL | Status: DC

## 2016-12-26 MED ORDER — PANTOPRAZOLE SODIUM 40 MG PO TBEC
40.0000 mg | DELAYED_RELEASE_TABLET | Freq: Two times a day (BID) | ORAL | Status: DC
  Administered 2016-12-26 – 2016-12-30 (×9): 40 mg via ORAL
  Filled 2016-12-26 (×8): qty 1

## 2016-12-26 MED ORDER — LIOTHYRONINE SODIUM 5 MCG PO TABS
5.0000 ug | ORAL_TABLET | ORAL | Status: DC
  Administered 2016-12-27 – 2016-12-30 (×4): 5 ug via ORAL
  Filled 2016-12-26 (×6): qty 1

## 2016-12-26 MED ORDER — IPRATROPIUM-ALBUTEROL 0.5-2.5 (3) MG/3ML IN SOLN
3.0000 mL | RESPIRATORY_TRACT | Status: DC
  Administered 2016-12-26: 3 mL via RESPIRATORY_TRACT
  Filled 2016-12-26: qty 3

## 2016-12-26 MED ORDER — IPRATROPIUM-ALBUTEROL 0.5-2.5 (3) MG/3ML IN SOLN: 3.0000 mL | RESPIRATORY_TRACT | Status: DC

## 2016-12-26 MED ORDER — SODIUM CHLORIDE 0.9% FLUSH: 3.0000 mL | INTRAVENOUS | Status: DC | PRN

## 2016-12-26 MED ORDER — SODIUM CHLORIDE 0.9 % IV SOLN
250.0000 mL | INTRAVENOUS | Status: DC | PRN
Start: 2016-12-26 — End: 2016-12-30

## 2016-12-26 MED ORDER — ACETAMINOPHEN 325 MG PO TABS: 650.0000 mg | ORAL_TABLET | Freq: Four times a day (QID) | ORAL | Status: DC | PRN

## 2016-12-26 MED ORDER — ALBUTEROL SULFATE (2.5 MG/3ML) 0.083% IN NEBU: 2.5000 mg | INHALATION_SOLUTION | RESPIRATORY_TRACT | Status: DC

## 2016-12-26 MED ORDER — GUAIFENESIN ER 600 MG PO TB12
600.0000 mg | ORAL_TABLET | Freq: Two times a day (BID) | ORAL | Status: DC
  Administered 2016-12-26 – 2016-12-30 (×8): 600 mg via ORAL
  Filled 2016-12-26 (×8): qty 1

## 2016-12-26 MED ORDER — PRAVASTATIN SODIUM 10 MG PO TABS
10.0000 mg | ORAL_TABLET | Freq: Every evening | ORAL | Status: DC
  Administered 2016-12-26 – 2016-12-29 (×4): 10 mg via ORAL
  Filled 2016-12-26 (×4): qty 1

## 2016-12-26 MED ORDER — ONDANSETRON HCL 4 MG PO TABS: 4.0000 mg | ORAL_TABLET | Freq: Four times a day (QID) | ORAL | Status: DC | PRN

## 2016-12-26 MED ORDER — INSULIN ASPART 100 UNIT/ML ~~LOC~~ SOLN
0.0000 [IU] | Freq: Three times a day (TID) | SUBCUTANEOUS | Status: DC
  Administered 2016-12-26 – 2016-12-27 (×2): 11 [IU] via SUBCUTANEOUS
  Administered 2016-12-27: 20 [IU] via SUBCUTANEOUS
  Administered 2016-12-28: 25 [IU] via SUBCUTANEOUS
  Administered 2016-12-28: 11 [IU] via SUBCUTANEOUS
  Administered 2016-12-28 – 2016-12-29 (×2): 15 [IU] via SUBCUTANEOUS
  Administered 2016-12-29: 20 [IU] via SUBCUTANEOUS
  Administered 2016-12-29 – 2016-12-30 (×2): 15 [IU] via SUBCUTANEOUS

## 2016-12-26 MED ORDER — INSULIN ASPART 100 UNIT/ML ~~LOC~~ SOLN
0.0000 [IU] | Freq: Every day | SUBCUTANEOUS | Status: DC
  Administered 2016-12-26: 2 [IU] via SUBCUTANEOUS
  Administered 2016-12-28: 5 [IU] via SUBCUTANEOUS
  Administered 2016-12-29: 3 [IU] via SUBCUTANEOUS

## 2016-12-26 MED ORDER — LORATADINE 10 MG PO TABS
10.0000 mg | ORAL_TABLET | Freq: Every day | ORAL | Status: DC
  Administered 2016-12-26: 10 mg via ORAL
  Filled 2016-12-26: qty 1

## 2016-12-26 MED ORDER — SODIUM CHLORIDE 0.9% FLUSH
3.0000 mL | Freq: Two times a day (BID) | INTRAVENOUS | Status: DC
  Administered 2016-12-27 – 2016-12-29 (×6): 3 mL via INTRAVENOUS

## 2016-12-26 MED ORDER — LIRAGLUTIDE 18 MG/3ML ~~LOC~~ SOPN
1.2000 mg | PEN_INJECTOR | Freq: Every day | SUBCUTANEOUS | Status: DC
  Administered 2016-12-27 – 2016-12-28 (×2): 1.2 mg via SUBCUTANEOUS
  Filled 2016-12-26: qty 3

## 2016-12-26 MED ORDER — CARVEDILOL 12.5 MG PO TABS
25.0000 mg | ORAL_TABLET | Freq: Two times a day (BID) | ORAL | Status: DC
  Administered 2016-12-26 – 2016-12-30 (×8): 25 mg via ORAL
  Filled 2016-12-26 (×8): qty 2

## 2016-12-26 MED ORDER — ONDANSETRON HCL 4 MG/2ML IJ SOLN: 4.0000 mg | Freq: Four times a day (QID) | INTRAMUSCULAR | Status: DC | PRN

## 2016-12-26 MED ORDER — IPRATROPIUM-ALBUTEROL 0.5-2.5 (3) MG/3ML IN SOLN
3.0000 mL | Freq: Four times a day (QID) | RESPIRATORY_TRACT | Status: DC
  Administered 2016-12-27 (×3): 3 mL via RESPIRATORY_TRACT
  Filled 2016-12-26 (×4): qty 3

## 2016-12-26 MED ORDER — DEXTROSE 5 % IV SOLN
1.0000 g | INTRAVENOUS | Status: DC
  Administered 2016-12-26 – 2016-12-29 (×4): 1 g via INTRAVENOUS
  Filled 2016-12-26 (×5): qty 10

## 2016-12-26 MED ORDER — INSULIN DETEMIR 100 UNIT/ML ~~LOC~~ SOLN
15.0000 [IU] | SUBCUTANEOUS | Status: DC
  Administered 2016-12-27: 15 [IU] via SUBCUTANEOUS
  Filled 2016-12-26 (×4): qty 0.15

## 2016-12-26 MED ORDER — METHYLPREDNISOLONE SODIUM SUCC 125 MG IJ SOLR
125.0000 mg | Freq: Four times a day (QID) | INTRAMUSCULAR | Status: DC
  Administered 2016-12-26 – 2016-12-29 (×11): 125 mg via INTRAVENOUS
  Filled 2016-12-26 (×11): qty 2

## 2016-12-26 MED ORDER — AZITHROMYCIN 500 MG IV SOLR
500.0000 mg | INTRAVENOUS | Status: DC
  Administered 2016-12-26 – 2016-12-28 (×3): 500 mg via INTRAVENOUS
  Filled 2016-12-26 (×4): qty 500

## 2016-12-26 MED ORDER — HYDROCHLOROTHIAZIDE 25 MG PO TABS
25.0000 mg | ORAL_TABLET | Freq: Every day | ORAL | Status: DC
  Administered 2016-12-27 – 2016-12-30 (×4): 25 mg via ORAL
  Filled 2016-12-26 (×5): qty 1

## 2016-12-26 NOTE — H&P (Signed)
Full note dictated. Admitted from my office with severe respiratory symptoms for iv rx.

## 2016-12-27 LAB — CBC WITH DIFFERENTIAL/PLATELET
BASOS ABS: 0 10*3/uL (ref 0.0–0.1)
BASOS PCT: 0 %
Eosinophils Absolute: 0 10*3/uL (ref 0.0–0.7)
Eosinophils Relative: 0 %
HEMATOCRIT: 41.3 % (ref 36.0–46.0)
Hemoglobin: 13.9 g/dL (ref 12.0–15.0)
Lymphocytes Relative: 8 %
Lymphs Abs: 0.7 10*3/uL (ref 0.7–4.0)
MCH: 32.2 pg (ref 26.0–34.0)
MCHC: 33.7 g/dL (ref 30.0–36.0)
MCV: 95.6 fL (ref 78.0–100.0)
MONO ABS: 0 10*3/uL — AB (ref 0.1–1.0)
MONOS PCT: 0 %
NEUTROS ABS: 8.1 10*3/uL — AB (ref 1.7–7.7)
Neutrophils Relative %: 92 %
Platelets: 233 10*3/uL (ref 150–400)
RBC: 4.32 MIL/uL (ref 3.87–5.11)
RDW: 12.6 % (ref 11.5–15.5)
WBC: 8.8 10*3/uL (ref 4.0–10.5)

## 2016-12-27 LAB — COMPREHENSIVE METABOLIC PANEL
ALBUMIN: 4 g/dL (ref 3.5–5.0)
ALT: 22 U/L (ref 14–54)
AST: 19 U/L (ref 15–41)
Alkaline Phosphatase: 50 U/L (ref 38–126)
Anion gap: 11 (ref 5–15)
BILIRUBIN TOTAL: 0.7 mg/dL (ref 0.3–1.2)
BUN: 11 mg/dL (ref 6–20)
CALCIUM: 9.6 mg/dL (ref 8.9–10.3)
CO2: 25 mmol/L (ref 22–32)
Chloride: 101 mmol/L (ref 101–111)
Creatinine, Ser: 0.62 mg/dL (ref 0.44–1.00)
GFR calc Af Amer: >60 mL/min (ref >60)
GFR calc non Af Amer: >60 mL/min (ref >60)
GLUCOSE: 456 mg/dL — AB (ref 65–99)
Potassium: 4 mmol/L (ref 3.5–5.1)
Sodium: 137 mmol/L (ref 135–145)
TOTAL PROTEIN: 7 g/dL (ref 6.5–8.1)

## 2016-12-27 LAB — GLUCOSE, CAPILLARY
GLUCOSE-CAPILLARY: 403 mg/dL — AB (ref 65–99)
Glucose-Capillary: 413 mg/dL — ABNORMAL HIGH (ref 65–99)
Glucose-Capillary: 411 mg/dL — ABNORMAL HIGH (ref 65–99)

## 2016-12-27 MED ORDER — BENZONATATE 100 MG PO CAPS
200.0000 mg | ORAL_CAPSULE | Freq: Four times a day (QID) | ORAL | Status: DC | PRN
  Administered 2016-12-27 – 2016-12-29 (×5): 200 mg via ORAL
  Filled 2016-12-27 (×5): qty 2

## 2016-12-27 MED ORDER — LORATADINE 10 MG PO TABS
10.0000 mg | ORAL_TABLET | Freq: Every day | ORAL | Status: DC
  Administered 2016-12-27 – 2016-12-30 (×4): 10 mg via ORAL
  Filled 2016-12-27 (×4): qty 1

## 2016-12-27 MED ORDER — INSULIN ASPART 100 UNIT/ML ~~LOC~~ SOLN
15.0000 [IU] | Freq: Once | SUBCUTANEOUS | Status: AC
  Administered 2016-12-27: 15 [IU] via SUBCUTANEOUS

## 2016-12-27 MED ORDER — ASPIRIN EC 81 MG PO TBEC
81.0000 mg | DELAYED_RELEASE_TABLET | ORAL | Status: DC
  Administered 2016-12-27 – 2016-12-30 (×2): 81 mg via ORAL
  Filled 2016-12-27 (×2): qty 1

## 2016-12-27 MED ORDER — INSULIN ASPART 100 UNIT/ML ~~LOC~~ SOLN
20.0000 [IU] | SUBCUTANEOUS | Status: AC
  Administered 2016-12-27: 20 [IU] via SUBCUTANEOUS

## 2016-12-27 MED ORDER — INSULIN DETEMIR 100 UNIT/ML ~~LOC~~ SOLN
20.0000 [IU] | SUBCUTANEOUS | Status: DC
  Administered 2016-12-28: 20 [IU] via SUBCUTANEOUS
  Filled 2016-12-27 (×3): qty 0.2

## 2016-12-27 NOTE — H&P (Signed)
Subjective: She says she feels better. She is much clearer. No new complaints.  Objective: Vital signs in last 24 hours: Temp:  [97.5 F (36.4 C)-97.8 F (36.6 C)] 97.5 F (36.4 C) (08/17 0422) Pulse Rate:  [96-112] 106 (08/17 0422) Resp:  [18-20] 18 (08/17 0422) BP: (109-129)/(70-91) 129/75 (08/17 0422) SpO2:  [95 %-98 %] 95 % (08/17 0422) Weight:  [68.4 kg (150 lb 12.7 oz)-69.2 kg (152 lb 9.6 oz)] 68.4 kg (150 lb 12.7 oz) (08/17 0422) Weight change:  Last BM Date: 12/26/16  Intake/Output from previous day: 08/16 0701 - 08/17 0700 In: 300 [IV Piggyback:300] Out: -   PHYSICAL EXAM General appearance: alert, cooperative and no distress Resp: She has cleared substantially. Cardio: regular rate and rhythm, S1, S2 normal, no murmur, click, rub or gallop GI: soft, non-tender; bowel sounds normal; no masses,  no organomegaly Extremities: extremities normal, atraumatic, no cyanosis or edema skin warm and dry  Lab Results:  Results for orders placed or performed during the hospital encounter of 12/26/16 (from the past 48 hour(s))  Glucose, capillary     Status: Abnormal   Collection Time: 12/26/16  4:48 PM  Result Value Ref Range   Glucose-Capillary 296 (H) 65 - 99 mg/dL   Comment 1 Notify RN    Comment 2 Document in Chart   Glucose, capillary     Status: Abnormal   Collection Time: 12/26/16  8:51 PM  Result Value Ref Range   Glucose-Capillary 242 (H) 65 - 99 mg/dL   Comment 1 Notify RN    Comment 2 Document in Chart   Glucose, capillary     Status: Abnormal   Collection Time: 12/27/16  7:52 AM  Result Value Ref Range   Glucose-Capillary 413 (H) 65 - 99 mg/dL    ABGS No results for input(s): PHART, PO2ART, TCO2, HCO3 in the last 72 hours.  Invalid input(s): PCO2 CULTURES No results found for this or any previous visit (from the past 240 hour(s)). Studies/Results: X-ray Chest Pa And Lateral  Result Date: 12/26/2016 CLINICAL DATA:  Shortness of breath, worse in the  last week. Productive cough since February. EXAM: CHEST  2 VIEW COMPARISON:  October 22, 2016 FINDINGS: The heart, hila, and mediastinum are normal. No pneumothorax. No pulmonary nodules or masses. No focal infiltrates. IMPRESSION: No active cardiopulmonary disease. Electronically Signed   By: Dorise Bullion III M.D   On: 12/26/2016 20:57    Medications:  Prior to Admission:  Prescriptions Prior to Admission  Medication Sig Dispense Refill Last Dose  . amoxicillin-clavulanate (AUGMENTIN) 875-125 MG tablet Take 1 tablet by mouth 2 (two) times daily.   10/30/2016 at Unknown time  . aspirin EC 81 MG tablet Take 1 tablet by mouth on Monday, Wednesday, and Friday   10/25/2016 at Unknown time  . carvedilol (COREG) 25 MG tablet Take 25 mg by mouth 2 (two) times daily with a meal.   10/31/2016 at 0535  . cetirizine (ZYRTEC) 10 MG tablet Take 10 mg by mouth at bedtime.   10/30/2016 at Unknown time  . glimepiride (AMARYL) 2 MG tablet Take 2 mg by mouth daily.   10/30/2016 at Unknown time  . guaiFENesin (MUCINEX) 600 MG 12 hr tablet Take 1 tablet (600 mg total) by mouth 2 (two) times daily. 60 tablet 1 10/30/2016 at Unknown time  . hydrochlorothiazide (HYDRODIURIL) 25 MG tablet Take 25 mg by mouth daily.    10/30/2016 at Unknown time  . insulin detemir (LEVEMIR) 100 UNIT/ML injection Inject 15  Units into the skin every morning.   10/30/2016  . ipratropium-albuterol (DUONEB) 0.5-2.5 (3) MG/3ML SOLN Take 3 mLs by nebulization 3 (three) times daily. 360 mL 5 10/31/2016 at Unknown time  . LEVEMIR FLEXTOUCH 100 UNIT/ML Pen      . liothyronine (CYTOMEL) 5 MCG tablet Take 5 mcg by mouth every morning.    10/30/2016 at Unknown time  . metFORMIN (GLUCOPHAGE-XR) 500 MG 24 hr tablet Take 1,000 mg by mouth 2 (two) times daily.   10/30/2016 at Unknown time  . pantoprazole (PROTONIX) 40 MG tablet Take 1 tablet (40 mg total) by mouth 2 (two) times daily before a meal. 30 tablet 2 10/31/2016 at Unknown time  . pravastatin (PRAVACHOL) 10  MG tablet Take 10 mg by mouth every evening.   10/30/2016 at Unknown time  . predniSONE (DELTASONE) 10 MG tablet      . predniSONE (DELTASONE) 20 MG tablet Take 40 mg by mouth daily with breakfast.   10/30/2016 at Unknown time  . VENTOLIN HFA 108 (90 Base) MCG/ACT inhaler Inhale 2 puffs into the lungs every 4 (four) hours.    10/31/2016 at Unknown time  . VICTOZA 18 MG/3ML SOPN Inject 1.2 mg into the skin at bedtime.    10/30/2016 at Unknown time   Scheduled: . aspirin EC  81 mg Oral Q M,W,F  . carvedilol  25 mg Oral BID WC  . enoxaparin (LOVENOX) injection  40 mg Subcutaneous Q24H  . guaiFENesin  600 mg Oral BID  . hydrochlorothiazide  25 mg Oral Daily  . insulin aspart  0-20 Units Subcutaneous TID WC  . insulin aspart  0-5 Units Subcutaneous QHS  . insulin detemir  15 Units Subcutaneous BH-q7a  . ipratropium-albuterol  3 mL Nebulization Q6H  . liothyronine  5 mcg Oral BH-q7a  . liraglutide  1.2 mg Subcutaneous QHS  . loratadine  10 mg Oral Daily  . methylPREDNISolone (SOLU-MEDROL) injection  125 mg Intravenous Q6H  . pantoprazole  40 mg Oral BID AC  . pravastatin  10 mg Oral QPM  . sodium chloride flush  3 mL Intravenous Q12H   Continuous: . sodium chloride    . azithromycin Stopped (12/26/16 2030)  . cefTRIAXone (ROCEPHIN)  IV Stopped (12/26/16 1838)   TLX:BWIOMB chloride, acetaminophen **OR** acetaminophen, ondansetron **OR** ondansetron (ZOFRAN) IV, sodium chloride flush  Assesment: She was admitted with exacerbation of asthma. She is much improved after 1 day of IV steroids and IV antibiotics. She has diabetes and of course is giving her more trouble since she's on IV steroids Active Problems:   Asthma exacerbation    Plan: Continue current treatments    LOS: 1 day   Michael Walrath L 12/27/2016, 9:06 AM

## 2016-12-27 NOTE — Progress Notes (Signed)
Inpatient Diabetes Program Recommendations  AACE/ADA: New Consensus Statement on Inpatient Glycemic Control (2015)  Target Ranges:  Prepandial:   less than 140 mg/dL      Peak postprandial:   less than 180 mg/dL (1-2 hours)      Critically ill patients:  140 - 180 mg/dL   Lab Results  Component Value Date   GLUCAP 413 (H) 12/27/2016    Review of Glycemic Control  Results for INETA, SINNING (MRN [id number]) as of 12/27/2016 10:17  Ref. Range 12/26/2016 16:48 12/26/2016 20:51 12/27/2016 07:52  Glucose-Capillary Latest Ref Range: 65 - 99 mg/dL 296 (H) 242 (H) 413 (H)    Diabetes history: Type 2 Outpatient Diabetes medications: Amaryl 2mg  qday, Levemir 15 units qam, Metformin 1000mg  bid  Current orders for Inpatient glycemic control: Levemir 15 units qhs, Victoza 1.2mg  qha, Novolog 0-20 units tid, Novolog 0-5 units qhs    **Solu-medrol 125mg  q6h  Inpatient Diabetes Program Recommendations: Consider adding Novolog mealtime insulin 8 units tid with meals (hold if patient eats less than 50%)- continue Novolog correction as ordered. (titrate Novolog as steroids are decreased)  Consider increasing Levemir to 14 units bid while the patient is on steroids- decrease as steroids are tapered.  Gentry Fitz, RN, BA, MHA, CDE Diabetes Coordinator Inpatient Diabetes Program  [phone] (Team Pager) [phone] ([place]) 12/27/2016 10:25 AM

## 2016-12-27 NOTE — Care Management Note (Addendum)
Case Management Note  Patient Details  Name: Kathy Tran MRN: [id number] Date of Birth: Apr 19, 1956  Subjective/Objective:    Chart reviewed. Adm with asthma exacerbation. Patient works full time at [employer]. Currently on room air.           Action/Plan: Anticipate DC with self care. Not eligible for home health.  Currently on room air.  No CM needs.       Expected Discharge Date:   12/28/2016               Expected Discharge Plan:     In-House Referral:     Discharge planning Services  CM Consult  Post Acute Care Choice:  NA Choice offered to:  NA  DME Arranged:    DME Agency:     HH Arranged:    HH Agency:     Status of Service:  Completed, signed off  If discussed at [employer] of Stay Meetings, dates discussed:    Additional Comments:  Zowie Lundahl, Chauncey Reading, RN 12/27/2016, 9:49 AM

## 2016-12-27 NOTE — H&P (Signed)
Kathy Tran MRN: [id number] DOB/AGE: 05-30-1955 61 y.o. Primary Care Physician:Fagan, Carloyn Manner, MD Admit date: 12/26/2016 Chief Complaint: Shortness of breath HPI: This is a 60 year old who came to my office for routine visit but was much worse than usual. She was coughing and congested very short of breath and uncomfortable. She could not finish a sentence. She has been coughing up sputum. She has been having problems for the last 2 or 3 months but much worse for the last 2 weeks. No fever. Her sputum is discolored. No chest pain. She has diabetes and her blood sugar has been up some. No nausea vomiting diarrhea. No swelling of her legs. No pleurisy. She's had some headache from coughing she thinks. No blurred vision. No other new complaints  Past Medical History:  Diagnosis Date  . Asthma    Flu march and May 2018  . Diabetes mellitus type 2, controlled ([place])    x5 yrs  . Heart murmur   . Hypertension    x 5 yrs  . Renal disorder    renal stones   Past Surgical History:  Procedure Laterality Date  . [place]  . COLONOSCOPY  03/27/2011   Procedure: COLONOSCOPY;  Surgeon: Rogene Houston, MD;  Location: [facility];  Service: Endoscopy;  Laterality: N/A;  2:00  . CYSTOSCOPY W/ URETERAL STENT PLACEMENT Left 11/08/2015   Procedure: CYSTOSCOPY, URETEROSCOPY, WITH STONE BASKET EXTRACTION;  Surgeon: Raynelle Bring, MD;  Location: [facility];  Service: Urology;  Laterality: Left;  . CYSTOSCOPY/RETROGRADE/URETEROSCOPY/STONE EXTRACTION WITH BASKET Right 07/03/2015   Procedure: CYSTOSCOPY/RETROGRADE/URETEROSCOPY/STONE EXTRACTION WITH BASKET;  Surgeon: Irine Seal, MD;  Location: [facility];  Service: Urology;  Laterality: Right;  . ENDOMETRIAL ABLATION    . FLEXIBLE BRONCHOSCOPY Bilateral 10/31/2016   Procedure: FLEXIBLE BRONCHOSCOPY WITH PROPOFOL;  Surgeon: Sinda Du, MD;  Location: [facility];  Service: Cardiopulmonary;  Laterality: Bilateral;  . KIDNEY STONE SURGERY     multiple  . SHOULDER SURGERY    . TONSILLECTOMY    . TUBAL LIGATION  1991        Family History  Problem Relation Age of Onset  . COPD Mother   . Cancer Father     Social History:  reports that she has never smoked. She has never used smokeless tobacco. She reports that she drinks alcohol. She reports that she does not use drugs.   Allergies:  Allergies  Allergen Reactions  . Sulfa Antibiotics Anaphylaxis    Medications Prior to Admission  Medication Sig Dispense Refill  . amoxicillin-clavulanate (AUGMENTIN) 875-125 MG tablet Take 1 tablet by mouth 2 (two) times daily.    Marland Kitchen aspirin EC 81 MG tablet Take 1 tablet by mouth on Monday, Wednesday, and Friday    . carvedilol (COREG) 25 MG tablet Take 25 mg by mouth 2 (two) times daily with a meal.    . cetirizine (ZYRTEC) 10 MG tablet Take 10 mg by mouth at bedtime.    Marland Kitchen glimepiride (AMARYL) 2 MG tablet Take 2 mg by mouth daily.    Marland Kitchen guaiFENesin (MUCINEX) 600 MG 12 hr tablet Take 1 tablet (600 mg total) by mouth 2 (two) times daily. 60 tablet 1  . hydrochlorothiazide (HYDRODIURIL) 25 MG tablet Take 25 mg by mouth daily.     . insulin detemir (LEVEMIR) 100 UNIT/ML injection Inject 15 Units into the skin every morning.    Marland Kitchen ipratropium-albuterol (DUONEB) 0.5-2.5 (3) MG/3ML SOLN Take 3 mLs by nebulization 3 (three) times daily. [place]  mL 5  . liothyronine (CYTOMEL) 5 MCG tablet Take 5 mcg by mouth every morning.     . metFORMIN (GLUCOPHAGE-XR) 500 MG 24 hr tablet Take 1,000 mg by mouth 2 (two) times daily.    . pantoprazole (PROTONIX) 40 MG tablet Take 1 tablet (40 mg total) by mouth 2 (two) times daily before a meal. 30 tablet 2  . pravastatin (PRAVACHOL) 10 MG tablet Take 10 mg by mouth every evening.    . predniSONE (DELTASONE) 20 MG tablet Take 40 mg by mouth daily with breakfast.    . VENTOLIN HFA 108 (90 Base) MCG/ACT inhaler Inhale 2 puffs into the lungs every 4 (four) hours.     Marland Kitchen VICTOZA 18 MG/3ML SOPN Inject 1.2 mg into the  skin at bedtime.          KJZ:PHXTA from the symptoms mentioned above,there are no other symptoms referable to all systems reviewed.Other than as mentioned 10 point review of systems is negative  Physical Exam: Blood pressure 129/75, pulse (!) 106, temperature (!) 97.5 F (36.4 C), temperature source Oral, resp. rate 18, height _0  (1.575 m), weight 68.4 kg (150 lb 12.7 oz), SpO2 95 %. Constitutional: She is awake and alert and in mild acute distress. Cardiovascular: Her heart is regular with normal heart rate. No gallop. No edema. Eyes: Pupils react EOMI. Ears nose mouth and throat: Mucous membranes are moist. Hearing is grossly normal. Respiratory: Her respiratory effort is increased. She has bilateral rhonchi and wheezing. Gastrointestinal: Her abdomen is soft with no masses. Skin: Warm and dry. Musculoskeletal: Her muscle strength is normal. Neurological: No focal abnormalities. Psychiatric: She is a little anxious about her situation   No results for input(s): WBC, NEUTROABS, HGB, HCT, MCV, PLT in the last 72 hours. No results for input(s): NA, K, CL, CO2, GLUCOSE, BUN, CREATININE, CALCIUM, MG in the last 72 hours.  Invalid input(s): PHOlablast2(ast:2,ALT:2,alkphos:2,bilitot:2,prot:2,albumin:2)@    No results found for this or any previous visit (from the past 240 hour(s)).   X-ray Chest Pa And Lateral  Result Date: 12/26/2016 CLINICAL DATA:  Shortness of breath, worse in the last week. Productive cough since February. EXAM: CHEST  2 VIEW COMPARISON:  October 22, 2016 FINDINGS: The heart, hila, and mediastinum are normal. No pneumothorax. No pulmonary nodules or masses. No focal infiltrates. IMPRESSION: No active cardiopulmonary disease. Electronically Signed   By: Dorise Bullion III M.D   On: 12/26/2016 20:57   Impression: She has asthma exacerbation. She has gotten much worse over the last 10 days. I think she needs to be hospitalized because she's already been on antibiotics and  steroids for a prolonged period of time. She has hypertension at baseline and that's well-controlled. She has diabetes but she's done pretty well with that despite all the steroids Active Problems:   Asthma exacerbation     Plan: Admit for IV antibiotics IV steroids continue other treatments. She is admitted to Dr. Willey Blade. Since I was seeing her in my office I did the history and physical but she will be on his service.      Jian Hodgman L   12/27/2016, 8:12 AM

## 2016-12-27 NOTE — Progress Notes (Signed)
Subjective: She was admitted yesterday with exacerbation of her asthma. She has had shortness of breath, coughing, wheezing and fatigue. She denies fever. She has been coughing up some discolored mucus.  Objective: Vital signs in last 24 hours: Vitals:   12/26/16 1646 12/26/16 2049 12/26/16 2106 12/27/16 0422  BP: (!) 109/91 124/70  129/75  Pulse: (!) 112 (!) 101 96 (!) 106  Resp: 20 18 18 18   Temp: 97.7 F (36.5 C) 97.8 F (36.6 C)  (!) 97.5 F (36.4 C)  TempSrc: Oral Oral  Oral  SpO2: 98% 97% 96% 95%  Weight: 152 lb 9.6 oz (69.2 kg)   150 lb 12.7 oz (68.4 kg)  Height: 5\' 2"  (1.575 m)      Weight change:   Intake/Output Summary (Last 24 hours) at 12/27/16 0803 Last data filed at 12/27/16 0319  Gross per 24 hour  Intake              300 ml  Output                0 ml  Net              300 ml    Physical Exam: Oropharynx unremarkable. Lungs reveal bilateral wheezes. Heart regular with no murmurs. Extremities reveal no edema.  Lab Results:    Results for orders placed or performed during the hospital encounter of 12/26/16 (from the past 24 hour(s))  Glucose, capillary     Status: Abnormal   Collection Time: 12/26/16  4:48 PM  Result Value Ref Range   Glucose-Capillary 296 (H) 65 - 99 mg/dL   Comment 1 Notify RN    Comment 2 Document in Chart   Glucose, capillary     Status: Abnormal   Collection Time: 12/26/16  8:51 PM  Result Value Ref Range   Glucose-Capillary 242 (H) 65 - 99 mg/dL   Comment 1 Notify RN    Comment 2 Document in Chart   Glucose, capillary     Status: Abnormal   Collection Time: 12/27/16  7:52 AM  Result Value Ref Range   Glucose-Capillary 413 (H) 65 - 99 mg/dL     ABGS No results for input(s): PHART, PO2ART, TCO2, HCO3 in the last 72 hours.  Invalid input(s): PCO2 CULTURES No results found for this or any previous visit (from the past 240 hour(s)). Studies/Results: X-ray Chest Pa And Lateral  Result Date: 12/26/2016 CLINICAL DATA:   Shortness of breath, worse in the last week. Productive cough since February. EXAM: CHEST  2 VIEW COMPARISON:  October 22, 2016 FINDINGS: The heart, hila, and mediastinum are normal. No pneumothorax. No pulmonary nodules or masses. No focal infiltrates. IMPRESSION: No active cardiopulmonary disease. Electronically Signed   By: Dorise Bullion III M.D   On: 12/26/2016 20:57   Micro Results: No results found for this or any previous visit (from the past 240 hour(s)). Studies/Results: X-ray Chest Pa And Lateral  Result Date: 12/26/2016 CLINICAL DATA:  Shortness of breath, worse in the last week. Productive cough since February. EXAM: CHEST  2 VIEW COMPARISON:  October 22, 2016 FINDINGS: The heart, hila, and mediastinum are normal. No pneumothorax. No pulmonary nodules or masses. No focal infiltrates. IMPRESSION: No active cardiopulmonary disease. Electronically Signed   By: Dorise Bullion III M.D   On: 12/26/2016 20:57   Medications:  I have reviewed the patient's current medications Scheduled Meds: . aspirin EC  81 mg Oral Q M,W,F  . carvedilol  25 mg Oral  BID WC  . enoxaparin (LOVENOX) injection  40 mg Subcutaneous Q24H  . guaiFENesin  600 mg Oral BID  . hydrochlorothiazide  25 mg Oral Daily  . insulin aspart  0-20 Units Subcutaneous TID WC  . insulin aspart  0-5 Units Subcutaneous QHS  . insulin detemir  15 Units Subcutaneous BH-q7a  . ipratropium-albuterol  3 mL Nebulization Q6H  . liothyronine  5 mcg Oral BH-q7a  . liraglutide  1.2 mg Subcutaneous QHS  . loratadine  10 mg Oral Daily  . methylPREDNISolone (SOLU-MEDROL) injection  125 mg Intravenous Q6H  . pantoprazole  40 mg Oral BID AC  . pravastatin  10 mg Oral QPM  . sodium chloride flush  3 mL Intravenous Q12H   Continuous Infusions: . sodium chloride    . azithromycin Stopped (12/26/16 2030)  . cefTRIAXone (ROCEPHIN)  IV Stopped (12/26/16 1838)   PRN Meds:.sodium chloride, acetaminophen **OR** acetaminophen, ondansetron **OR**  ondansetron (ZOFRAN) IV, sodium chloride flush   Assessment/Plan: #1. Asthma exacerbation. Continue Rocephin, Zithromax, Solu-Medrol and nebulizers. #2. Diabetes. Continue Levemir and NovoLog.  Chest x-ray reveals no acute infiltrate. Active Problems:   Asthma exacerbation     LOS: 1 day   Kathy Tran 12/27/2016, 8:03 AM

## 2016-12-28 LAB — GLUCOSE, CAPILLARY
GLUCOSE-CAPILLARY: 330 mg/dL — AB (ref 65–99)
GLUCOSE-CAPILLARY: 447 mg/dL — AB (ref 65–99)
GLUCOSE-CAPILLARY: 276 mg/dL — AB (ref 65–99)
GLUCOSE-CAPILLARY: 381 mg/dL — AB (ref 65–99)

## 2016-12-28 MED ORDER — IPRATROPIUM-ALBUTEROL 0.5-2.5 (3) MG/3ML IN SOLN
3.0000 mL | Freq: Four times a day (QID) | RESPIRATORY_TRACT | Status: DC
  Administered 2016-12-28 – 2016-12-30 (×7): 3 mL via RESPIRATORY_TRACT
  Filled 2016-12-28 (×7): qty 3

## 2016-12-28 MED ORDER — FLUCONAZOLE 100 MG PO TABS
150.0000 mg | ORAL_TABLET | Freq: Once | ORAL | Status: AC
  Administered 2016-12-28: 150 mg via ORAL
  Filled 2016-12-28: qty 2

## 2016-12-28 MED ORDER — INSULIN ASPART 100 UNIT/ML ~~LOC~~ SOLN
25.0000 [IU] | Freq: Once | SUBCUTANEOUS | Status: AC
  Administered 2016-12-28: 25 [IU] via SUBCUTANEOUS

## 2016-12-28 MED ORDER — INSULIN DETEMIR 100 UNIT/ML ~~LOC~~ SOLN
25.0000 [IU] | SUBCUTANEOUS | Status: DC
  Administered 2016-12-29 – 2016-12-30 (×2): 25 [IU] via SUBCUTANEOUS
  Filled 2016-12-28 (×3): qty 0.25

## 2016-12-28 NOTE — Progress Notes (Signed)
Subjective: She notes continued wheezing and dyspnea with exertion. No fever.  Objective: Vital signs in last 24 hours: Vitals:   12/27/16 1954 12/27/16 2109 12/28/16 0529 12/28/16 0820  BP:  116/84 126/80   Pulse:  (!) 106 93   Resp:  18 18   Temp:  97.8 F (36.6 C) 97.8 F (36.6 C)   TempSrc:  Oral Oral   SpO2: 97% 98% 96% 95%  Weight:   150 lb 1.6 oz (68.1 kg)   Height:       Weight change: -2 lb 8 oz (-1.134 kg)  Intake/Output Summary (Last 24 hours) at 12/28/16 0907 Last data filed at 12/28/16 0837  Gross per 24 hour  Intake              890 ml  Output             3200 ml  Net            -2310 ml    Physical Exam: Alert. No distress. Lungs reveal bilateral wheezes. Heart regular with no murmurs. Extremities reveal no edema. Oropharynx reveals no thrush.  Lab Results:    Results for orders placed or performed during the hospital encounter of 12/26/16 (from the past 24 hour(s))  Glucose, capillary     Status: Abnormal   Collection Time: 12/27/16 11:06 AM  Result Value Ref Range   Glucose-Capillary 403 (H) 65 - 99 mg/dL  Glucose, capillary     Status: Abnormal   Collection Time: 12/27/16  9:08 PM  Result Value Ref Range   Glucose-Capillary 411 (H) 65 - 99 mg/dL   Comment 1 Notify RN    Comment 2 Document in Chart   Glucose, capillary     Status: Abnormal   Collection Time: 12/28/16  7:59 AM  Result Value Ref Range   Glucose-Capillary 330 (H) 65 - 99 mg/dL     ABGS No results for input(s): PHART, PO2ART, TCO2, HCO3 in the last 72 hours.  Invalid input(s): PCO2 CULTURES No results found for this or any previous visit (from the past 240 hour(s)). Studies/Results: X-ray Chest Pa And Lateral  Result Date: 12/26/2016 CLINICAL DATA:  Shortness of breath, worse in the last week. Productive cough since February. EXAM: CHEST  2 VIEW COMPARISON:  October 22, 2016 FINDINGS: The heart, hila, and mediastinum are normal. No pneumothorax. No pulmonary nodules or masses. No  focal infiltrates. IMPRESSION: No active cardiopulmonary disease. Electronically Signed   By: Dorise Bullion III M.D   On: 12/26/2016 20:57   Micro Results: No results found for this or any previous visit (from the past 240 hour(s)). Studies/Results: X-ray Chest Pa And Lateral  Result Date: 12/26/2016 CLINICAL DATA:  Shortness of breath, worse in the last week. Productive cough since February. EXAM: CHEST  2 VIEW COMPARISON:  October 22, 2016 FINDINGS: The heart, hila, and mediastinum are normal. No pneumothorax. No pulmonary nodules or masses. No focal infiltrates. IMPRESSION: No active cardiopulmonary disease. Electronically Signed   By: Dorise Bullion III M.D   On: 12/26/2016 20:57   Medications:  I have reviewed the patient's current medications Scheduled Meds: . aspirin EC  81 mg Oral Q M,W,F  . carvedilol  25 mg Oral BID WC  . enoxaparin (LOVENOX) injection  40 mg Subcutaneous Q24H  . guaiFENesin  600 mg Oral BID  . hydrochlorothiazide  25 mg Oral Daily  . insulin aspart  0-20 Units Subcutaneous TID WC  . insulin aspart  0-5 Units Subcutaneous  QHS  . [START ON 12/29/2016] insulin detemir  25 Units Subcutaneous BH-q7a  . ipratropium-albuterol  3 mL Nebulization Q6H WA  . liothyronine  5 mcg Oral BH-q7a  . liraglutide  1.2 mg Subcutaneous QHS  . loratadine  10 mg Oral Daily  . methylPREDNISolone (SOLU-MEDROL) injection  125 mg Intravenous Q6H  . pantoprazole  40 mg Oral BID AC  . pravastatin  10 mg Oral QPM  . sodium chloride flush  3 mL Intravenous Q12H   Continuous Infusions: . sodium chloride    . azithromycin 500 mg (12/27/16 1721)  . cefTRIAXone (ROCEPHIN)  IV Stopped (12/27/16 1728)   PRN Meds:.sodium chloride, acetaminophen **OR** acetaminophen, benzonatate, ondansetron **OR** ondansetron (ZOFRAN) IV, sodium chloride flush   Assessment/Plan: #1. Asthma exacerbation. Continue current treatment. Improving. #2. Diabetes. Increase Levemir to 25 units daily.  Encouraged  patient to comply with Lovenox.  She notes symptoms of a developing vaginal yeast infection which will be treated with Diflucan. Active Problems:   Asthma exacerbation     LOS: 2 days   Daiwik Buffalo 12/28/2016, 9:07 AM

## 2016-12-28 NOTE — Progress Notes (Signed)
Subjective: She says she feels much better. She was able to sleep last night.  Objective: Vital signs in last 24 hours: Temp:  [97.8 F (36.6 C)-97.9 F (36.6 C)] 97.8 F (36.6 C) (08/18 0529) Pulse Rate:  [93-106] 93 (08/18 0529) Resp:  [18-19] 18 (08/18 0529) BP: (109-126)/(72-84) 126/80 (08/18 0529) SpO2:  [96 %-98 %] 96 % (08/18 0529) Weight:  [68.1 kg (150 lb 1.6 oz)] 68.1 kg (150 lb 1.6 oz) (08/18 0529) Weight change: -1.134 kg (-2 lb 8 oz) Last BM Date: 12/27/16  Intake/Output from previous day: 08/17 0701 - 08/18 0700 In: 965 [P.O.:915; IV Piggyback:50] Out: 3250 [Urine:3250]  PHYSICAL EXAM General appearance: alert, cooperative and no distress Resp: rhonchi bilaterally Cardio: regular rate and rhythm, S1, S2 normal, no murmur, click, rub or gallop GI: soft, non-tender; bowel sounds normal; no masses,  no organomegaly Extremities: extremities normal, atraumatic, no cyanosis or edema Skin warm and dry  Lab Results:  Results for orders placed or performed during the hospital encounter of 12/26/16 (from the past 48 hour(s))  Glucose, capillary     Status: Abnormal   Collection Time: 12/26/16  4:48 PM  Result Value Ref Range   Glucose-Capillary 296 (H) 65 - 99 mg/dL   Comment 1 Notify RN    Comment 2 Document in Chart   Glucose, capillary     Status: Abnormal   Collection Time: 12/26/16  8:51 PM  Result Value Ref Range   Glucose-Capillary 242 (H) 65 - 99 mg/dL   Comment 1 Notify RN    Comment 2 Document in Chart   Glucose, capillary     Status: Abnormal   Collection Time: 12/27/16  7:52 AM  Result Value Ref Range   Glucose-Capillary 413 (H) 65 - 99 mg/dL  CBC with Differential/Platelet     Status: Abnormal   Collection Time: 12/27/16  8:46 AM  Result Value Ref Range   WBC 8.8 4.0 - 10.5 K/uL   RBC 4.32 3.87 - 5.11 MIL/uL   Hemoglobin 13.9 12.0 - 15.0 g/dL   HCT 41.3 36.0 - 46.0 %   MCV 95.6 78.0 - 100.0 fL   MCH 32.2 26.0 - 34.0 pg   MCHC 33.7 30.0 - 36.0  g/dL   RDW 12.6 11.5 - 15.5 %   Platelets 233 150 - 400 K/uL   Neutrophils Relative % 92 %   Neutro Abs 8.1 (H) 1.7 - 7.7 K/uL   Lymphocytes Relative 8 %   Lymphs Abs 0.7 0.7 - 4.0 K/uL   Monocytes Relative 0 %   Monocytes Absolute 0.0 (L) 0.1 - 1.0 K/uL   Eosinophils Relative 0 %   Eosinophils Absolute 0.0 0.0 - 0.7 K/uL   Basophils Relative 0 %   Basophils Absolute 0.0 0.0 - 0.1 K/uL  Comprehensive metabolic panel     Status: Abnormal   Collection Time: 12/27/16  8:46 AM  Result Value Ref Range   Sodium 137 135 - 145 mmol/L   Potassium 4.0 3.5 - 5.1 mmol/L   Chloride 101 101 - 111 mmol/L   CO2 25 22 - 32 mmol/L   Glucose, Bld 456 (H) 65 - 99 mg/dL   BUN 11 6 - 20 mg/dL   Creatinine, Ser 0.62 0.44 - 1.00 mg/dL   Calcium 9.6 8.9 - 10.3 mg/dL   Total Protein 7.0 6.5 - 8.1 g/dL   Albumin 4.0 3.5 - 5.0 g/dL   AST 19 15 - 41 U/L   ALT 22 14 - 54  U/L   Alkaline Phosphatase 50 38 - 126 U/L   Total Bilirubin 0.7 0.3 - 1.2 mg/dL   GFR calc non Af Amer >60 >60 mL/min   GFR calc Af Amer >60 >60 mL/min    Comment: (NOTE) The eGFR has been calculated using the CKD EPI equation. This calculation has not been validated in all clinical situations. eGFR's persistently <60 mL/min signify possible Chronic Kidney Disease.    Anion gap 11 5 - 15  Glucose, capillary     Status: Abnormal   Collection Time: 12/27/16 11:06 AM  Result Value Ref Range   Glucose-Capillary 403 (H) 65 - 99 mg/dL  Glucose, capillary     Status: Abnormal   Collection Time: 12/27/16  9:08 PM  Result Value Ref Range   Glucose-Capillary 411 (H) 65 - 99 mg/dL   Comment 1 Notify RN    Comment 2 Document in Chart   Glucose, capillary     Status: Abnormal   Collection Time: 12/28/16  7:59 AM  Result Value Ref Range   Glucose-Capillary 330 (H) 65 - 99 mg/dL    ABGS No results for input(s): PHART, PO2ART, TCO2, HCO3 in the last 72 hours.  Invalid input(s): PCO2 CULTURES No results found for this or any previous  visit (from the past 240 hour(s)). Studies/Results: X-ray Chest Pa And Lateral  Result Date: 12/26/2016 CLINICAL DATA:  Shortness of breath, worse in the last week. Productive cough since February. EXAM: CHEST  2 VIEW COMPARISON:  October 22, 2016 FINDINGS: The heart, hila, and mediastinum are normal. No pneumothorax. No pulmonary nodules or masses. No focal infiltrates. IMPRESSION: No active cardiopulmonary disease. Electronically Signed   By: Dorise Bullion III M.D   On: 12/26/2016 20:57    Medications:  Prior to Admission:  Prescriptions Prior to Admission  Medication Sig Dispense Refill Last Dose  . aspirin EC 81 MG tablet Take 1 tablet by mouth on Monday, Wednesday, and Friday   Past Week at Unknown time  . carvedilol (COREG) 25 MG tablet Take 25 mg by mouth 2 (two) times daily with a meal.   12/26/2016 at 0800  . cetirizine (ZYRTEC) 10 MG tablet Take 10 mg by mouth at bedtime.   Past Week at Unknown time  . glimepiride (AMARYL) 2 MG tablet Take 2 mg by mouth daily.   12/26/2016 at Unknown time  . guaiFENesin (MUCINEX) 600 MG 12 hr tablet Take 1 tablet (600 mg total) by mouth 2 (two) times daily. 60 tablet 1 12/26/2016 at Unknown time  . hydrochlorothiazide (HYDRODIURIL) 25 MG tablet Take 25 mg by mouth daily.    12/26/2016 at Unknown time  . insulin detemir (LEVEMIR) 100 UNIT/ML injection Inject 15 Units into the skin every morning.   12/26/2016 at Unknown time  . ipratropium-albuterol (DUONEB) 0.5-2.5 (3) MG/3ML SOLN Take 3 mLs by nebulization 3 (three) times daily. 360 mL 5 12/26/2016 at Unknown time  . liothyronine (CYTOMEL) 5 MCG tablet Take 5 mcg by mouth every morning.    12/26/2016 at Unknown time  . metFORMIN (GLUCOPHAGE-XR) 500 MG 24 hr tablet Take 1,000 mg by mouth 2 (two) times daily.   12/26/2016 at Unknown time  . pantoprazole (PROTONIX) 40 MG tablet Take 1 tablet (40 mg total) by mouth 2 (two) times daily before a meal. 30 tablet 2 12/26/2016 at Unknown time  . pravastatin (PRAVACHOL)  10 MG tablet Take 10 mg by mouth every evening.   Past Week at Unknown time  . predniSONE (DELTASONE)  20 MG tablet Take 40 mg by mouth daily with breakfast.   Past Week at Unknown time  . VENTOLIN HFA 108 (90 Base) MCG/ACT inhaler Inhale 2 puffs into the lungs every 4 (four) hours.    12/26/2016 at Unknown time  . VICTOZA 18 MG/3ML SOPN Inject 1.2 mg into the skin at bedtime.    Past Week at Unknown time   Scheduled: . aspirin EC  81 mg Oral Q M,W,F  . carvedilol  25 mg Oral BID WC  . enoxaparin (LOVENOX) injection  40 mg Subcutaneous Q24H  . guaiFENesin  600 mg Oral BID  . hydrochlorothiazide  25 mg Oral Daily  . insulin aspart  0-20 Units Subcutaneous TID WC  . insulin aspart  0-5 Units Subcutaneous QHS  . insulin detemir  20 Units Subcutaneous BH-q7a  . ipratropium-albuterol  3 mL Nebulization Q6H WA  . liothyronine  5 mcg Oral BH-q7a  . liraglutide  1.2 mg Subcutaneous QHS  . loratadine  10 mg Oral Daily  . methylPREDNISolone (SOLU-MEDROL) injection  125 mg Intravenous Q6H  . pantoprazole  40 mg Oral BID AC  . pravastatin  10 mg Oral QPM  . sodium chloride flush  3 mL Intravenous Q12H   Continuous: . sodium chloride    . azithromycin 500 mg (12/27/16 1721)  . cefTRIAXone (ROCEPHIN)  IV Stopped (12/27/16 1728)   [facility]:RVACQP chloride, acetaminophen **OR** acetaminophen, benzonatate, ondansetron **OR** ondansetron (ZOFRAN) IV, sodium chloride flush  Assesment: She was admitted with asthma exacerbation probably acute on chronic bronchitis. She is much improved. Active Problems:   Asthma exacerbation    Plan: Continue current treatments    LOS: 2 days   Calani Gick L 12/28/2016, 8:22 AM

## 2016-12-29 LAB — GLUCOSE, CAPILLARY
GLUCOSE-CAPILLARY: 329 mg/dL — AB (ref 65–99)
Glucose-Capillary: 310 mg/dL — ABNORMAL HIGH (ref 65–99)
Glucose-Capillary: 375 mg/dL — ABNORMAL HIGH (ref 65–99)
Glucose-Capillary: 286 mg/dL — ABNORMAL HIGH (ref 65–99)

## 2016-12-29 MED ORDER — METHYLPREDNISOLONE SODIUM SUCC 40 MG IJ SOLR
40.0000 mg | Freq: Four times a day (QID) | INTRAMUSCULAR | Status: DC
  Administered 2016-12-29 – 2016-12-30 (×4): 40 mg via INTRAVENOUS
  Filled 2016-12-29 (×3): qty 1

## 2016-12-29 MED ORDER — AZITHROMYCIN 250 MG PO TABS
500.0000 mg | ORAL_TABLET | Freq: Every day | ORAL | Status: DC
  Administered 2016-12-29: 500 mg via ORAL
  Filled 2016-12-29: qty 2

## 2016-12-29 NOTE — Progress Notes (Signed)
PHARMACIST - PHYSICIAN COMMUNICATION DR:    CONCERNING: Antibiotic IV to Oral Route Change Policy  RECOMMENDATION: This patient is receiving Azithromycin  by the intravenous route.  Based on criteria approved by the Pharmacy and Therapeutics Committee, the antibiotic(s) is/are being converted to the equivalent oral dose form(s).   DESCRIPTION: These criteria include:  Patient being treated for a respiratory tract infection, urinary tract infection, cellulitis or clostridium difficile associated diarrhea if on metronidazole  The patient is not neutropenic and does not exhibit a GI malabsorption state  The patient is eating (either orally or via tube) and/or has been taking other orally administered medications for a least 24 hours  The patient is improving clinically and has a Tmax < 100.5  If you have questions about this conversion, please contact the Pharmacy Department  [x]  ( [phone] )  [place] []  ( [phone]  [facility] []  ( [phone]  [place] []  ( [phone]  [facility] []  ( [phone] )  [facility]  

## 2016-12-29 NOTE — Progress Notes (Signed)
Subjective: Coughing and wheezing are improving. No fever.  Objective: Vital signs in last 24 hours: Vitals:   12/28/16 1413 12/28/16 1948 12/28/16 2035 12/29/16 0514  BP:   (!) 141/76 118/74  Pulse:  (!) 114 92 91  Resp:  18 18 18   Temp:   97.9 F (36.6 C) 98.1 F (36.7 C)  TempSrc:   Oral Oral  SpO2: 97% 95% 99% 96%  Weight:    150 lb 11.2 oz (68.4 kg)  Height:       Weight change: 9.6 oz (0.272 kg)  Intake/Output Summary (Last 24 hours) at 12/29/16 0918 Last data filed at 12/28/16 1800  Gross per 24 hour  Intake              840 ml  Output             2700 ml  Net            -1860 ml    Physical Exam: Alert. Lungs reveal decreased wheezes. Heart regular with no murmurs. Extremities reveal no edema.    Lab Results:    Results for orders placed or performed during the hospital encounter of 12/26/16 (from the past 24 hour(s))  Glucose, capillary     Status: Abnormal   Collection Time: 12/28/16 11:22 AM  Result Value Ref Range   Glucose-Capillary 447 (H) 65 - 99 mg/dL  Glucose, capillary     Status: Abnormal   Collection Time: 12/28/16  5:08 PM  Result Value Ref Range   Glucose-Capillary 276 (H) 65 - 99 mg/dL  Glucose, capillary     Status: Abnormal   Collection Time: 12/28/16  8:38 PM  Result Value Ref Range   Glucose-Capillary 381 (H) 65 - 99 mg/dL   Comment 1 Notify RN    Comment 2 Document in Chart   Glucose, capillary     Status: Abnormal   Collection Time: 12/29/16  7:36 AM  Result Value Ref Range   Glucose-Capillary 329 (H) 65 - 99 mg/dL     ABGS No results for input(s): PHART, PO2ART, TCO2, HCO3 in the last 72 hours.  Invalid input(s): PCO2 CULTURES No results found for this or any previous visit (from the past 240 hour(s)). Studies/Results: No results found. Micro Results: No results found for this or any previous visit (from the past 240 hour(s)). Studies/Results: No results found. Medications:  I have reviewed the patient's current  medications Scheduled Meds: . aspirin EC  81 mg Oral Q M,W,F  . carvedilol  25 mg Oral BID WC  . enoxaparin (LOVENOX) injection  40 mg Subcutaneous Q24H  . guaiFENesin  600 mg Oral BID  . hydrochlorothiazide  25 mg Oral Daily  . insulin aspart  0-20 Units Subcutaneous TID WC  . insulin aspart  0-5 Units Subcutaneous QHS  . insulin detemir  25 Units Subcutaneous BH-q7a  . ipratropium-albuterol  3 mL Nebulization Q6H WA  . liothyronine  5 mcg Oral BH-q7a  . liraglutide  1.2 mg Subcutaneous QHS  . loratadine  10 mg Oral Daily  . methylPREDNISolone (SOLU-MEDROL) injection  40 mg Intravenous Q6H  . pantoprazole  40 mg Oral BID AC  . pravastatin  10 mg Oral QPM  . sodium chloride flush  3 mL Intravenous Q12H   Continuous Infusions: . sodium chloride    . azithromycin Stopped (12/28/16 1949)  . cefTRIAXone (ROCEPHIN)  IV 1 g (12/28/16 1815)   PRN Meds:.sodium chloride, acetaminophen **OR** acetaminophen, benzonatate, ondansetron **OR** ondansetron (ZOFRAN) IV,  sodium chloride flush   Assessment/Plan: #1. Asthma exacerbation. Significantly improved today. Solu-Medrol dose is decreased. Continue antibiotics and nebulizer treatments. #2. Diabetes. Levemir dose has been increased. Expect improved glucose levels with Solu-Medrol dose reduction. Continue sliding scale NovoLog. Glucose 329 this morning. Active Problems:   Asthma exacerbation     LOS: 3 days   Kathy Tran 12/29/2016, 9:18 AM

## 2016-12-29 NOTE — Progress Notes (Signed)
Subjective: She looks better. She is still coughing up sputum. She is still wheezing.  Objective: Vital signs in last 24 hours: Temp:  [97.9 F (36.6 C)-98.5 F (36.9 C)] 98.1 F (36.7 C) (08/19 0514) Pulse Rate:  [87-114] 91 (08/19 0514) Resp:  [18-19] 18 (08/19 0514) BP: (118-141)/(74-76) 118/74 (08/19 0514) SpO2:  [95 %-99 %] 96 % (08/19 0514) Weight:  [68.4 kg (150 lb 11.2 oz)] 68.4 kg (150 lb 11.2 oz) (08/19 0514) Weight change: 0.272 kg (9.6 oz) Last BM Date: 12/28/16  Intake/Output from previous day: 08/18 0701 - 08/19 0700 In: 1200 [P.O.:1200] Out: 3050 [Urine:3050]  PHYSICAL EXAM General appearance: alert, cooperative and no distress Resp: clear to auscultation bilaterally Cardio: regular rate and rhythm, S1, S2 normal, no murmur, click, rub or gallop GI: soft, non-tender; bowel sounds normal; no masses,  no organomegaly Extremities: extremities normal, atraumatic, no cyanosis or edema Skin warm and dry  Lab Results:  Results for orders placed or performed during the hospital encounter of 12/26/16 (from the past 48 hour(s))  CBC with Differential/Platelet     Status: Abnormal   Collection Time: 12/27/16  8:46 AM  Result Value Ref Range   WBC 8.8 4.0 - 10.5 K/uL   RBC 4.32 3.87 - 5.11 MIL/uL   Hemoglobin 13.9 12.0 - 15.0 g/dL   HCT 41.3 36.0 - 46.0 %   MCV 95.6 78.0 - 100.0 fL   MCH 32.2 26.0 - 34.0 pg   MCHC 33.7 30.0 - 36.0 g/dL   RDW 12.6 11.5 - 15.5 %   Platelets 233 150 - 400 K/uL   Neutrophils Relative % 92 %   Neutro Abs 8.1 (H) 1.7 - 7.7 K/uL   Lymphocytes Relative 8 %   Lymphs Abs 0.7 0.7 - 4.0 K/uL   Monocytes Relative 0 %   Monocytes Absolute 0.0 (L) 0.1 - 1.0 K/uL   Eosinophils Relative 0 %   Eosinophils Absolute 0.0 0.0 - 0.7 K/uL   Basophils Relative 0 %   Basophils Absolute 0.0 0.0 - 0.1 K/uL  Comprehensive metabolic panel     Status: Abnormal   Collection Time: 12/27/16  8:46 AM  Result Value Ref Range   Sodium 137 135 - 145 mmol/L   Potassium 4.0 3.5 - 5.1 mmol/L   Chloride 101 101 - 111 mmol/L   CO2 25 22 - 32 mmol/L   Glucose, Bld 456 (H) 65 - 99 mg/dL   BUN 11 6 - 20 mg/dL   Creatinine, Ser 0.62 0.44 - 1.00 mg/dL   Calcium 9.6 8.9 - 10.3 mg/dL   Total Protein 7.0 6.5 - 8.1 g/dL   Albumin 4.0 3.5 - 5.0 g/dL   AST 19 15 - 41 U/L   ALT 22 14 - 54 U/L   Alkaline Phosphatase 50 38 - 126 U/L   Total Bilirubin 0.7 0.3 - 1.2 mg/dL   GFR calc non Af Amer >60 >60 mL/min   GFR calc Af Amer >60 >60 mL/min    Comment: (NOTE) The eGFR has been calculated using the CKD EPI equation. This calculation has not been validated in all clinical situations. eGFR's persistently <60 mL/min signify possible Chronic Kidney Disease.    Anion gap 11 5 - 15  Glucose, capillary     Status: Abnormal   Collection Time: 12/27/16 11:06 AM  Result Value Ref Range   Glucose-Capillary 403 (H) 65 - 99 mg/dL  Glucose, capillary     Status: Abnormal   Collection Time: 12/27/16  9:08 PM  Result Value Ref Range   Glucose-Capillary 411 (H) 65 - 99 mg/dL   Comment 1 Notify RN    Comment 2 Document in Chart   Glucose, capillary     Status: Abnormal   Collection Time: 12/28/16  7:59 AM  Result Value Ref Range   Glucose-Capillary 330 (H) 65 - 99 mg/dL  Glucose, capillary     Status: Abnormal   Collection Time: 12/28/16 11:22 AM  Result Value Ref Range   Glucose-Capillary 447 (H) 65 - 99 mg/dL  Glucose, capillary     Status: Abnormal   Collection Time: 12/28/16  5:08 PM  Result Value Ref Range   Glucose-Capillary 276 (H) 65 - 99 mg/dL  Glucose, capillary     Status: Abnormal   Collection Time: 12/28/16  8:38 PM  Result Value Ref Range   Glucose-Capillary 381 (H) 65 - 99 mg/dL   Comment 1 Notify RN    Comment 2 Document in Chart   Glucose, capillary     Status: Abnormal   Collection Time: 12/29/16  7:36 AM  Result Value Ref Range   Glucose-Capillary 329 (H) 65 - 99 mg/dL    ABGS No results for input(s): PHART, PO2ART, TCO2, HCO3 in  the last 72 hours.  Invalid input(s): PCO2 CULTURES No results found for this or any previous visit (from the past 240 hour(s)). Studies/Results: No results found.  Medications:  Prior to Admission:  Prescriptions Prior to Admission  Medication Sig Dispense Refill Last Dose  . aspirin EC 81 MG tablet Take 1 tablet by mouth on Monday, Wednesday, and Friday   Past Week at Unknown time  . carvedilol (COREG) 25 MG tablet Take 25 mg by mouth 2 (two) times daily with a meal.   12/26/2016 at 0800  . cetirizine (ZYRTEC) 10 MG tablet Take 10 mg by mouth at bedtime.   Past Week at Unknown time  . glimepiride (AMARYL) 2 MG tablet Take 2 mg by mouth daily.   12/26/2016 at Unknown time  . guaiFENesin (MUCINEX) 600 MG 12 hr tablet Take 1 tablet (600 mg total) by mouth 2 (two) times daily. 60 tablet 1 12/26/2016 at Unknown time  . hydrochlorothiazide (HYDRODIURIL) 25 MG tablet Take 25 mg by mouth daily.    12/26/2016 at Unknown time  . insulin detemir (LEVEMIR) 100 UNIT/ML injection Inject 15 Units into the skin every morning.   12/26/2016 at Unknown time  . ipratropium-albuterol (DUONEB) 0.5-2.5 (3) MG/3ML SOLN Take 3 mLs by nebulization 3 (three) times daily. 360 mL 5 12/26/2016 at Unknown time  . liothyronine (CYTOMEL) 5 MCG tablet Take 5 mcg by mouth every morning.    12/26/2016 at Unknown time  . metFORMIN (GLUCOPHAGE-XR) 500 MG 24 hr tablet Take 1,000 mg by mouth 2 (two) times daily.   12/26/2016 at Unknown time  . pantoprazole (PROTONIX) 40 MG tablet Take 1 tablet (40 mg total) by mouth 2 (two) times daily before a meal. 30 tablet 2 12/26/2016 at Unknown time  . pravastatin (PRAVACHOL) 10 MG tablet Take 10 mg by mouth every evening.   Past Week at Unknown time  . predniSONE (DELTASONE) 20 MG tablet Take 40 mg by mouth daily with breakfast.   Past Week at Unknown time  . VENTOLIN HFA 108 (90 Base) MCG/ACT inhaler Inhale 2 puffs into the lungs every 4 (four) hours.    12/26/2016 at Unknown time  . VICTOZA 18  MG/3ML SOPN Inject 1.2 mg into the skin at bedtime.  Past Week at Unknown time   Scheduled: . aspirin EC  81 mg Oral Q M,W,F  . carvedilol  25 mg Oral BID WC  . enoxaparin (LOVENOX) injection  40 mg Subcutaneous Q24H  . guaiFENesin  600 mg Oral BID  . hydrochlorothiazide  25 mg Oral Daily  . insulin aspart  0-20 Units Subcutaneous TID WC  . insulin aspart  0-5 Units Subcutaneous QHS  . insulin detemir  25 Units Subcutaneous BH-q7a  . ipratropium-albuterol  3 mL Nebulization Q6H WA  . liothyronine  5 mcg Oral BH-q7a  . liraglutide  1.2 mg Subcutaneous QHS  . loratadine  10 mg Oral Daily  . methylPREDNISolone (SOLU-MEDROL) injection  125 mg Intravenous Q6H  . pantoprazole  40 mg Oral BID AC  . pravastatin  10 mg Oral QPM  . sodium chloride flush  3 mL Intravenous Q12H   Continuous: . sodium chloride    . azithromycin Stopped (12/28/16 1949)  . cefTRIAXone (ROCEPHIN)  IV 1 g (12/28/16 1815)   [facility]:BWLSLH chloride, acetaminophen **OR** acetaminophen, benzonatate, ondansetron **OR** ondansetron (ZOFRAN) IV, sodium chloride flush  Assesment: She was admitted with asthma exacerbation. She is still wheezing. She is better. I think okay to cut steroids Active Problems:   Asthma exacerbation    Plan: Reduce Solu-Medrol. Continue other treatments.    LOS: 3 days   Nigil Braman L 12/29/2016, 8:16 AM

## 2016-12-30 LAB — GLUCOSE, CAPILLARY
GLUCOSE-CAPILLARY: 284 mg/dL — AB (ref 65–99)
GLUCOSE-CAPILLARY: 338 mg/dL — AB (ref 65–99)

## 2016-12-30 MED ORDER — PREDNISONE 20 MG PO TABS: 40.0000 mg | ORAL_TABLET | Freq: Every day | ORAL | 0 refills | Status: AC

## 2016-12-30 MED ORDER — CEFUROXIME AXETIL 500 MG PO TABS: 500.0000 mg | ORAL_TABLET | Freq: Two times a day (BID) | ORAL | 0 refills | Status: AC

## 2016-12-30 MED ORDER — INSULIN DETEMIR 100 UNIT/ML ~~LOC~~ SOLN: 25.0000 [IU] | SUBCUTANEOUS | 11 refills | Status: DC

## 2016-12-30 NOTE — Progress Notes (Signed)
She is being discharged. I will have her follow-up in my office next week

## 2016-12-30 NOTE — Discharge Summary (Signed)
Physician Discharge Summary  MYSTERY SCHRUPP VOH:[id number] DOB: 10-20-1955 DOA: 12/26/2016   Admit date: 12/26/2016 Discharge date: 12/30/2016  Discharge Diagnoses:  Active Problems:   Asthma exacerbation    Wt Readings from Last 3 Encounters:  12/30/16 149 lb 14.6 oz (68 kg)  10/30/16 148 lb (67.1 kg)  09/30/16 145 lb 8 oz (66 kg)     Hospital Course:  This patient is a 61 year old female who presented with shortness of breath, coughing and wheezing. She was admitted directly to the hospital from Dr. Luan Pulling office. Her chest x-ray revealed no acute infiltrate. She was treated with Rocephin, Zithromax, Solu-Medrol and nebulizer treatments. Coughing and wheezing gradually improved. Diabetes was treated with sliding scale insulin and Levemir. Her Levemir dose was increased.  She is now breathing much more comfortably. She is moving air well and has normal oxygen saturations. She is much improved and stable for discharge on the morning of August 20. She will be seen in follow-up in the office in one week. Prednisone will be gradually tapered.   Discharge Instructions  Discharge Instructions    Diet - low sodium heart healthy    Complete by:  As directed    Increase activity slowly    Complete by:  As directed      Allergies as of 12/30/2016      Reactions   Sulfa Antibiotics Anaphylaxis      Medication List    TAKE these medications   aspirin EC 81 MG tablet Take 1 tablet by mouth on Monday, Wednesday, and Friday   carvedilol 25 MG tablet Commonly known as:  COREG Take 25 mg by mouth 2 (two) times daily with a meal.   cefUROXime 500 MG tablet Commonly known as:  CEFTIN Take 1 tablet (500 mg total) by mouth 2 (two) times daily.   cetirizine 10 MG tablet Commonly known as:  ZYRTEC Take 10 mg by mouth at bedtime.   glimepiride 2 MG tablet Commonly known as:  AMARYL Take 2 mg by mouth daily.   guaiFENesin 600 MG 12 hr tablet Commonly known as:  MUCINEX Take 1 tablet  (600 mg total) by mouth 2 (two) times daily.   hydrochlorothiazide 25 MG tablet Commonly known as:  HYDRODIURIL Take 25 mg by mouth daily.   insulin detemir 100 UNIT/ML injection Commonly known as:  LEVEMIR Inject 0.25 mLs (25 Units total) into the skin every morning. What changed:  how much to take   ipratropium-albuterol 0.5-2.5 (3) MG/3ML Soln Commonly known as:  DUONEB Take 3 mLs by nebulization 3 (three) times daily.   liothyronine 5 MCG tablet Commonly known as:  CYTOMEL Take 5 mcg by mouth every morning.   metFORMIN 500 MG 24 hr tablet Commonly known as:  GLUCOPHAGE-XR Take 1,000 mg by mouth 2 (two) times daily.   pantoprazole 40 MG tablet Commonly known as:  PROTONIX Take 1 tablet (40 mg total) by mouth 2 (two) times daily before a meal.   pravastatin 10 MG tablet Commonly known as:  PRAVACHOL Take 10 mg by mouth every evening.   predniSONE 20 MG tablet Commonly known as:  DELTASONE Take 2 tablets (40 mg total) by mouth daily. What changed:  when to take this   VENTOLIN HFA 108 (90 Base) MCG/ACT inhaler Generic drug:  albuterol Inhale 2 puffs into the lungs every 4 (four) hours.   VICTOZA 18 MG/3ML Sopn Generic drug:  liraglutide Inject 1.2 mg into the skin at bedtime.  Chaitanya Amedee 12/30/2016

## 2016-12-30 NOTE — Progress Notes (Signed)
Patient discharged home.  IV removed - WNL.  Reviewed DC instructions and medications.   Emphasized importance of completing doses of Abx and continuing on prednisone to prevent further exacerbation.  Advised to continue taking blood sugars frequently at home.  Instructed to follow up with PCP and pulmonology.  Verbalizes understanding.  No questions at this time.  Awaiting arrival of husband, in NAD at this time

## 2017-01-03 DIAGNOSIS — E119 Type 2 diabetes mellitus without complications: Secondary | ICD-10-CM | POA: Diagnosis not present

## 2017-01-03 DIAGNOSIS — J45901 Unspecified asthma with (acute) exacerbation: Secondary | ICD-10-CM | POA: Diagnosis not present

## 2017-01-08 ENCOUNTER — Ambulatory Visit (HOSPITAL_COMMUNITY): Admit: 2017-01-08 | Payer: 59 | Admitting: Internal Medicine

## 2017-01-08 ENCOUNTER — Encounter (HOSPITAL_COMMUNITY): Payer: Self-pay

## 2017-01-08 SURGERY — COLONOSCOPY
Anesthesia: Moderate Sedation

## 2017-01-09 DIAGNOSIS — E119 Type 2 diabetes mellitus without complications: Secondary | ICD-10-CM | POA: Diagnosis not present

## 2017-01-09 DIAGNOSIS — I10 Essential (primary) hypertension: Secondary | ICD-10-CM | POA: Diagnosis not present

## 2017-01-09 DIAGNOSIS — J45909 Unspecified asthma, uncomplicated: Secondary | ICD-10-CM | POA: Diagnosis not present

## 2017-01-14 ENCOUNTER — Encounter (HOSPITAL_COMMUNITY): Payer: Self-pay

## 2017-01-14 ENCOUNTER — Encounter (HOSPITAL_COMMUNITY)
Admission: RE | Admit: 2017-01-14 | Discharge: 2017-01-14 | Disposition: A | Discharge status: Home / Self Care | Payer: 59 | Source: Ambulatory Visit | Attending: Pulmonary Disease | Admitting: Pulmonary Disease

## 2017-01-14 ENCOUNTER — Other Ambulatory Visit: Payer: Self-pay | Admitting: Pulmonary Disease

## 2017-01-14 DIAGNOSIS — J4551 Severe persistent asthma with (acute) exacerbation: Secondary | ICD-10-CM | POA: Diagnosis not present

## 2017-01-14 DIAGNOSIS — I10 Essential (primary) hypertension: Secondary | ICD-10-CM | POA: Diagnosis not present

## 2017-01-14 DIAGNOSIS — J40 Bronchitis, not specified as acute or chronic: Secondary | ICD-10-CM | POA: Diagnosis not present

## 2017-01-15 ENCOUNTER — Other Ambulatory Visit (HOSPITAL_COMMUNITY): Payer: 59

## 2017-01-16 ENCOUNTER — Other Ambulatory Visit: Payer: Self-pay | Admitting: Pulmonary Disease

## 2017-01-17 ENCOUNTER — Ambulatory Visit (HOSPITAL_COMMUNITY): Payer: 59 | Admitting: Anesthesiology

## 2017-01-17 ENCOUNTER — Encounter (HOSPITAL_COMMUNITY)
Admission: RE | Disposition: A | Discharge status: Home / Self Care | Payer: Self-pay | Source: Ambulatory Visit | Attending: Pulmonary Disease

## 2017-01-17 ENCOUNTER — Encounter (HOSPITAL_COMMUNITY): Payer: Self-pay | Admitting: *Deleted

## 2017-01-17 ENCOUNTER — Ambulatory Visit (HOSPITAL_COMMUNITY)
Admission: RE | Admit: 2017-01-17 | Discharge: 2017-01-17 | Disposition: A | Discharge status: Home / Self Care | Payer: 59 | Source: Ambulatory Visit | Attending: Pulmonary Disease | Admitting: Pulmonary Disease

## 2017-01-17 DIAGNOSIS — B371 Pulmonary candidiasis: Secondary | ICD-10-CM | POA: Insufficient documentation

## 2017-01-17 DIAGNOSIS — Z87442 Personal history of urinary calculi: Secondary | ICD-10-CM | POA: Diagnosis not present

## 2017-01-17 DIAGNOSIS — Z794 Long term (current) use of insulin: Secondary | ICD-10-CM | POA: Insufficient documentation

## 2017-01-17 DIAGNOSIS — E119 Type 2 diabetes mellitus without complications: Secondary | ICD-10-CM | POA: Diagnosis not present

## 2017-01-17 DIAGNOSIS — Z882 Allergy status to sulfonamides status: Secondary | ICD-10-CM | POA: Insufficient documentation

## 2017-01-17 DIAGNOSIS — J45901 Unspecified asthma with (acute) exacerbation: Secondary | ICD-10-CM | POA: Insufficient documentation

## 2017-01-17 DIAGNOSIS — R911 Solitary pulmonary nodule: Secondary | ICD-10-CM | POA: Diagnosis not present

## 2017-01-17 DIAGNOSIS — K219 Gastro-esophageal reflux disease without esophagitis: Secondary | ICD-10-CM | POA: Insufficient documentation

## 2017-01-17 DIAGNOSIS — I10 Essential (primary) hypertension: Secondary | ICD-10-CM | POA: Insufficient documentation

## 2017-01-17 DIAGNOSIS — Z79899 Other long term (current) drug therapy: Secondary | ICD-10-CM | POA: Diagnosis not present

## 2017-01-17 DIAGNOSIS — R011 Cardiac murmur, unspecified: Secondary | ICD-10-CM | POA: Insufficient documentation

## 2017-01-17 DIAGNOSIS — Z7982 Long term (current) use of aspirin: Secondary | ICD-10-CM | POA: Diagnosis not present

## 2017-01-17 DIAGNOSIS — R918 Other nonspecific abnormal finding of lung field: Secondary | ICD-10-CM | POA: Diagnosis not present

## 2017-01-17 HISTORY — PX: FLEXIBLE BRONCHOSCOPY: SHX5094

## 2017-01-17 LAB — GLUCOSE, CAPILLARY
GLUCOSE-CAPILLARY: 110 mg/dL — AB (ref 65–99)
GLUCOSE-CAPILLARY: 106 mg/dL — AB (ref 65–99)

## 2017-01-17 SURGERY — BRONCHOSCOPY, FLEXIBLE
Anesthesia: Monitor Anesthesia Care

## 2017-01-17 MED ORDER — FENTANYL CITRATE (PF) 100 MCG/2ML IJ SOLN
25.0000 ug | Freq: Once | INTRAMUSCULAR | Status: AC
  Administered 2017-01-17: 25 ug via INTRAVENOUS

## 2017-01-17 MED ORDER — SUCCINYLCHOLINE CHLORIDE 20 MG/ML IJ SOLN
INTRAMUSCULAR | Status: AC
  Filled 2017-01-17: qty 1

## 2017-01-17 MED ORDER — FENTANYL CITRATE (PF) 100 MCG/2ML IJ SOLN
INTRAMUSCULAR | Status: AC
  Filled 2017-01-17: qty 2

## 2017-01-17 MED ORDER — PROPOFOL 500 MG/50ML IV EMUL
INTRAVENOUS | Status: DC | PRN
  Administered 2017-01-17: 150 ug/kg/min via INTRAVENOUS

## 2017-01-17 MED ORDER — 0.9 % SODIUM CHLORIDE (POUR BTL) OPTIME
TOPICAL | Status: DC | PRN
  Administered 2017-01-17: 10 mL

## 2017-01-17 MED ORDER — LIDOCAINE VISCOUS 2 % MT SOLN
OROMUCOSAL | Status: DC | PRN
  Administered 2017-01-17: 8 mL via OROMUCOSAL

## 2017-01-17 MED ORDER — LIDOCAINE HCL (PF) 2 % IJ SOLN
INTRAMUSCULAR | Status: DC | PRN
  Administered 2017-01-17: 2 mL

## 2017-01-17 MED ORDER — MIDAZOLAM HCL 2 MG/2ML IJ SOLN
INTRAMUSCULAR | Status: AC
  Filled 2017-01-17: qty 2

## 2017-01-17 MED ORDER — PROPOFOL 10 MG/ML IV BOLUS
INTRAVENOUS | Status: AC
  Filled 2017-01-17: qty 60

## 2017-01-17 MED ORDER — LIDOCAINE VISCOUS 2 % MT SOLN
OROMUCOSAL | Status: AC
  Filled 2017-01-17: qty 15

## 2017-01-17 MED ORDER — LACTATED RINGERS IV SOLN
INTRAVENOUS | Status: DC
  Administered 2017-01-17: 1000 mL via INTRAVENOUS

## 2017-01-17 MED ORDER — IPRATROPIUM-ALBUTEROL 0.5-2.5 (3) MG/3ML IN SOLN
3.0000 mL | Freq: Four times a day (QID) | RESPIRATORY_TRACT | Status: DC
  Administered 2017-01-17: 3 mL via RESPIRATORY_TRACT

## 2017-01-17 MED ORDER — MIDAZOLAM HCL 2 MG/2ML IJ SOLN
1.0000 mg | INTRAMUSCULAR | Status: AC
  Administered 2017-01-17: 2 mg via INTRAVENOUS

## 2017-01-17 MED ORDER — IPRATROPIUM-ALBUTEROL 0.5-2.5 (3) MG/3ML IN SOLN
RESPIRATORY_TRACT | Status: AC
  Filled 2017-01-17: qty 3

## 2017-01-17 MED ORDER — LIDOCAINE HCL (PF) 2 % IJ SOLN
INTRAMUSCULAR | Status: AC
  Filled 2017-01-17: qty 10

## 2017-01-17 MED ORDER — BUTAMBEN-TETRACAINE-BENZOCAINE 2-2-14 % EX AERO
INHALATION_SPRAY | CUTANEOUS | Status: AC
  Filled 2017-01-17: qty 5

## 2017-01-17 MED ORDER — IPRATROPIUM-ALBUTEROL 0.5-2.5 (3) MG/3ML IN SOLN: 3.0000 mL | RESPIRATORY_TRACT | Status: DC

## 2017-01-17 SURGICAL SUPPLY — 16 items
BRUSH CYTOL CELLEBRITY 1.5X140 (MISCELLANEOUS) ×2 IMPLANT
CLOTH BEACON ORANGE TIMEOUT ST (SAFETY) ×2 IMPLANT
CONNECTOR 5 IN 1 STRAIGHT STRL (MISCELLANEOUS) ×2 IMPLANT
FORCEPS BIOP RJ4 1.8 (CUTTING FORCEPS) ×2 IMPLANT
GLOVE BIO SURGEON STRL SZ7.5 (GLOVE) ×2 IMPLANT
KIT CLEAN CATCH URINE (SET/KITS/TRAYS/PACK) IMPLANT
MARKER SKIN DUAL TIP RULER LAB (MISCELLANEOUS) ×2 IMPLANT
NS IRRIG 1000ML POUR BTL (IV SOLUTION) ×2 IMPLANT
SPONGE GAUZE 4X4 12PLY (GAUZE/BANDAGES/DRESSINGS) ×2 IMPLANT
SYR 20CC LL (SYRINGE) ×2 IMPLANT
SYR 30ML LL (SYRINGE) ×2 IMPLANT
SYR CONTROL 10ML LL (SYRINGE) ×2 IMPLANT
TRAP SPECIMEN CP (MISCELLANEOUS) ×2 IMPLANT
VALVE DISPOSABLE (MISCELLANEOUS) ×2 IMPLANT
WATER STERILE IRR 1000ML POUR (IV SOLUTION) ×2 IMPLANT
YANKAUER SUCT BULB TIP 10FT TU (MISCELLANEOUS) ×4 IMPLANT

## 2017-01-17 NOTE — Anesthesia Postprocedure Evaluation (Signed)
Anesthesia Post Note  Patient: Kathy Tran  Procedure(s) Performed: Procedure(s) (LRB): FLEXIBLE BRONCHOSCOPY WITH PROPOFOL (N/A)  Patient location during evaluation: PACU Anesthesia Type: MAC Level of consciousness: awake, oriented and patient cooperative Pain management: pain level controlled Vital Signs Assessment: post-procedure vital signs reviewed and stable Respiratory status: spontaneous breathing and patient connected to face mask oxygen Cardiovascular status: stable Postop Assessment: no signs of nausea or vomiting Anesthetic complications: no     Last Vitals:  Vitals:   01/17/17 0700 01/17/17 0715  BP:    Pulse:    Resp: 15 14  Temp:    SpO2: 96% 100%    Last Pain:  Vitals:   01/17/17 0638  TempSrc: Oral                 [name] A

## 2017-01-17 NOTE — OR Nursing (Signed)
Patient had a small amount of bright red blood from left nostril after scope intubation. Patient tolerated procedure without difficulty.

## 2017-01-17 NOTE — Anesthesia Preprocedure Evaluation (Signed)
Anesthesia Evaluation  Patient identified by MRN, date of birth, ID band Patient awake    Reviewed: Allergy & Precautions, NPO status , Patient's Chart, lab work & pertinent test results  Airway Mallampati: I  TM Distance: >3 FB Neck ROM: Full    Dental  (+) Teeth Intact   Pulmonary asthma ,  Pulmonary nodules   breath sounds clear to auscultation       Cardiovascular hypertension, Pt. on home beta blockers and Pt. on medications  Rhythm:Regular Rate:Normal     Neuro/Psych negative neurological ROS  negative psych ROS   GI/Hepatic GERD  Controlled and Medicated,  Endo/Other  diabetes, Poorly Controlled, Type 2, Oral Hypoglycemic Agents  Renal/GU      Musculoskeletal   Abdominal   Peds  Hematology   Anesthesia Other Findings   Reproductive/Obstetrics                             Anesthesia Physical Anesthesia Plan  ASA: III  Anesthesia Plan: MAC   Post-op Pain Management:    Induction: Intravenous  PONV Risk Score and Plan:   Airway Management Planned: Simple Face Mask  Additional Equipment:   Intra-op Plan:   Post-operative Plan:   Informed Consent: I have reviewed the patients History and Physical, chart, labs and discussed the procedure including the risks, benefits and alternatives for the proposed anesthesia with the patient or authorized representative who has indicated his/her understanding and acceptance.     Plan Discussed with:   Anesthesia Plan Comments:         Anesthesia Quick Evaluation

## 2017-01-17 NOTE — Op Note (Signed)
Bronchoscopy Procedure Note  Date of Operation: 01/17/2017  Pre-op Diagnosis: Asthma exacerbation  Post-op Diagnosis: Same/mucus plugging  Surgeon: Heron Pitcock Tran  Assistants: None  Anesthesia: Propofol  Operation: Flexible fiberoptic bronchoscopy, diagnostic and therapeutic  Findings: Large amount of milky white secretions  Specimen: Washings  Estimated Blood Loss: 10 mL from nose  Drains: None  Complications: Nosebleed,left naris  Indications and History: The patient is a 61 y.o. female with severe asthma which has not responded to antibiotics and steroids. She had bronchoscopy done about 3 months ago which helped her clear substantially over a month and is going to undergo 1 again to try to clear secretions.  The risks, benefits, complications, treatment options and expected outcomes were discussed with the patient.  The possibilities of reaction to medication, pulmonary aspiration, perforation of a viscus, bleeding, failure to diagnose a condition and creating a complication requiring transfusion or operation were discussed with the patient who freely signed the consent.    Description of Procedure: The patient was seen in the Holding Room and the site of surgery properly noted/marked.  The patient was taken to the endoscopy suite identified as Kathy Tran and the procedure verified as Flexible Fiberoptic Bronchoscopy.  A Time Out was held and the above information confirmed.   After the induction of topical nasopharyngeal anesthesia, the patient was positioned supine and the bronchoscope was passed through the left naris . The vocal cords were visualized and  2% plain lidocaine,  10 ml was topically placed onto the cords. The cords were traversed. The scope was then passed into the trachea.  2% plain lidocaine 5 ml was used topically on the carina.  Careful inspection of the tracheal lumen was accomplished. The scope was sequentially passed into the left main and then left upper  and lower bronchi and segmental bronchi. Washings was done and there was 1 specimen.   The scope was then withdrawn and advanced into the right main bronchus and then into the RUL, RML, and RLL bronchi and segmental bronchi. Washings was done and there was 1 specimen.   Endobronchial findings: She had some erythema of the trachea and had significant secretions throughout Trachea: Erythema Carina: Erythematous mucosa Right main bronchus: Erythematous mucosa with large amount of secretions Right upper lobe bronchus: Erythematous mucosa with large amount of secretions Right upper lobe bronchus: Erythematous mucosa with large amount of secretions Right upper lobe bronchus: Erythematous mucosa with large amount of secretions Left main bronchus: Erythematous mucosa with large amount of secretions Left upper lobe bronchus: Erythematous mucosa with large amount of secretions Left lower lobe bronchus: Erythematous mucosa with large amount of secretions  The Patient was taken to the Endoscopy Recovery area in satisfactory condition.  Attestation: I performed the procedure.  Kathy Tran

## 2017-01-17 NOTE — Discharge Instructions (Signed)
Flexible Bronchoscopy, Care After These instructions give you information on caring for yourself after your procedure. Your doctor may also give you more specific instructions. Call your doctor if you have any problems or questions after your procedure. Follow these instructions at home:  Do not eat or drink anything for 2 hours after your procedure. If you try to eat or drink before the medicine wears off, food or drink could go into your lungs. You could also burn yourself.  After 2 hours have passed and when you can cough and gag normally, you may eat soft food and drink liquids slowly.  The day after the test, you may eat your normal diet.  You may do your normal activities.  Keep all doctor visits. Get help right away if:  You get more and more short of breath.  You get light-headed.  You feel like you are going to pass out (faint).  You have chest pain.  You have new problems that worry you.  You cough up more than a little blood.  You cough up more blood than before. This information is not intended to replace advice given to you by your health care provider. Make sure you discuss any questions you have with your health care provider. Document Released: 02/24/2009 Document Revised: 10/05/2015 Document Reviewed: 01/01/2013 Elsevier Interactive Patient Education  2017 [employer].

## 2017-01-17 NOTE — Transfer of Care (Signed)
Immediate Anesthesia Transfer of Care Note  Patient: Kathy Tran  Procedure(s) Performed: Procedure(s): FLEXIBLE BRONCHOSCOPY WITH PROPOFOL (N/A)  Patient Location: PACU  Anesthesia Type:MAC  Level of Consciousness: awake, oriented and patient cooperative  Airway & Oxygen Therapy: Patient Spontanous Breathing and Patient connected to face mask oxygen  Post-op Assessment: Report given to RN and Post -op Vital signs reviewed and stable  Post vital signs: Reviewed and stable  Last Vitals:  Vitals:   01/17/17 0700 01/17/17 0715  BP:    Pulse:    Resp: 15 14  Temp:    SpO2: 96% 100%    Last Pain:  Vitals:   01/17/17 0638  TempSrc: Oral      Patients Stated Pain Goal: 5 (54/98/26 4158)  Complications: No apparent anesthesia complications

## 2017-01-17 NOTE — Anesthesia Procedure Notes (Signed)
Procedure Name: MAC Date/Time: 01/17/2017 7:29 AM Performed by: Andree Elk, AMY A Pre-anesthesia Checklist: Patient identified, Emergency Drugs available, Suction available, Patient being monitored and Timeout performed Oxygen Delivery Method: Simple face mask

## 2017-01-17 NOTE — H&P (Signed)
Kathy Tran MRN: [id number] DOB/AGE: 10/02/1955 61 y.o. Primary Care Physician:Fagan, Carloyn Manner, MD Admit date: 01/17/2017 Chief Complaint: Shortness of breath HPI: This is a 61 year old who is here for bronchoscopy. She has been having a very long exacerbation of asthma. She's been in the hospital twice had bronchoscopy about 2 months ago which seemed to make her better for a significant period of time. She has continued however to have increasing wheezing cough congestion shortness of breath. She's been on off antibiotics and prednisone for the last 4 months or so. She is undergoing bronchoscopy to try to clear secretions. She doesn't have any other new complaints. She is wheezing some this morning so she will have a nebulizer treatment before the procedure is done.  Past Medical History:  Diagnosis Date  . Asthma    Flu march and May 2018  . Diabetes mellitus type 2, controlled ([place])    x5 yrs  . Heart murmur   . Hypertension    x 5 yrs  . Renal disorder    renal stones   Past Surgical History:  Procedure Laterality Date  . [place]  . COLONOSCOPY  03/27/2011   Procedure: COLONOSCOPY;  Surgeon: Rogene Houston, MD;  Location: [facility];  Service: Endoscopy;  Laterality: N/A;  2:00  . CYSTOSCOPY W/ URETERAL STENT PLACEMENT Left 11/08/2015   Procedure: CYSTOSCOPY, URETEROSCOPY, WITH STONE BASKET EXTRACTION;  Surgeon: Raynelle Bring, MD;  Location: [facility];  Service: Urology;  Laterality: Left;  . CYSTOSCOPY/RETROGRADE/URETEROSCOPY/STONE EXTRACTION WITH BASKET Right 07/03/2015   Procedure: CYSTOSCOPY/RETROGRADE/URETEROSCOPY/STONE EXTRACTION WITH BASKET;  Surgeon: Irine Seal, MD;  Location: [facility];  Service: Urology;  Laterality: Right;  . ENDOMETRIAL ABLATION    . FLEXIBLE BRONCHOSCOPY Bilateral 10/31/2016   Procedure: FLEXIBLE BRONCHOSCOPY WITH PROPOFOL;  Surgeon: Sinda Du, MD;  Location: [facility];  Service: Cardiopulmonary;  Laterality: Bilateral;  . KIDNEY  STONE SURGERY     multiple  . SHOULDER SURGERY    . TONSILLECTOMY    . TUBAL LIGATION  1991        Family History  Problem Relation Age of Onset  . COPD Mother   . Cancer Father     Social History:  reports that she has never smoked. She has never used smokeless tobacco. She reports that she drinks alcohol. She reports that she does not use drugs.   Allergies:  Allergies  Allergen Reactions  . Sulfa Antibiotics Anaphylaxis    Medications Prior to Admission  Medication Sig Dispense Refill  . aspirin EC 81 MG tablet Take 1 tablet by mouth on Monday, Wednesday, and Friday    . carvedilol (COREG) 25 MG tablet Take 25 mg by mouth 2 (two) times daily with a meal.    . cetirizine (ZYRTEC) 10 MG tablet Take 10 mg by mouth at bedtime.    Marland Kitchen glimepiride (AMARYL) 2 MG tablet Take 2 mg by mouth daily.    Marland Kitchen guaiFENesin (MUCINEX) 600 MG 12 hr tablet Take 1 tablet (600 mg total) by mouth 2 (two) times daily. 60 tablet 1  . hydrochlorothiazide (HYDRODIURIL) 25 MG tablet Take 25 mg by mouth daily.     . insulin detemir (LEVEMIR) 100 UNIT/ML injection Inject 0.25 mLs (25 Units total) into the skin every morning. 10 mL 11  . ipratropium-albuterol (DUONEB) 0.5-2.5 (3) MG/3ML SOLN Take 3 mLs by nebulization 3 (three) times daily. 360 mL 5  . liothyronine (CYTOMEL) 5 MCG tablet Take 5 mcg by mouth every morning.     Marland Kitchen  metFORMIN (GLUCOPHAGE-XR) 500 MG 24 hr tablet Take 1,000 mg by mouth 2 (two) times daily.    . pantoprazole (PROTONIX) 40 MG tablet Take 1 tablet (40 mg total) by mouth 2 (two) times daily before a meal. 30 tablet 2  . pravastatin (PRAVACHOL) 10 MG tablet Take 10 mg by mouth every evening.    . predniSONE (DELTASONE) 20 MG tablet Take 2 tablets (40 mg total) by mouth daily. 60 tablet 0  . VENTOLIN HFA 108 (90 Base) MCG/ACT inhaler Inhale 2 puffs into the lungs every 4 (four) hours.     Marland Kitchen VICTOZA 18 MG/3ML SOPN Inject 1.2 mg into the skin at bedtime.          XIH:WTUUE from the  symptoms mentioned above,there are no other symptoms referable to all systems reviewed.10 point review of systems otherwise negative  Physical Exam: Blood pressure 117/75, pulse 85, temperature 97.7 F (36.5 C), temperature source Oral, resp. rate 18, height _0  (1.575 m), weight 67.6 kg (149 lb), SpO2 95 %. Constitutional: She is awake and alert and in no acute distress but she is wheezing. Eyes: Pupils react ears nose mouth and throat: Mucous membranes are moist cardiovascular: Her heart is regular with normal heart sounds. Respiratory: Her respiratory effort is increased and she is wheezing bilaterally. Skin: Warm and dry. Gastrointestinal: Her abdomen is soft with no masses. Musculoskeletal: Normal strength neurological: No focal abnormalities psychiatric: Normal mood and affect   No results for input(s): WBC, NEUTROABS, HGB, HCT, MCV, PLT in the last 72 hours. No results for input(s): NA, K, CL, CO2, GLUCOSE, BUN, CREATININE, CALCIUM, MG in the last 72 hours.  Invalid input(s): PHOlablast2(ast:2,ALT:2,alkphos:2,bilitot:2,prot:2,albumin:2)@    No results found for this or any previous visit (from the past 240 hour(s)).   X-ray Chest Pa And Lateral  Result Date: 12/26/2016 CLINICAL DATA:  Shortness of breath, worse in the last week. Productive cough since February. EXAM: CHEST  2 VIEW COMPARISON:  October 22, 2016 FINDINGS: The heart, hila, and mediastinum are normal. No pneumothorax. No pulmonary nodules or masses. No focal infiltrates. IMPRESSION: No active cardiopulmonary disease. Electronically Signed   By: Dorise Bullion III M.D   On: 12/26/2016 20:57   Impression: She is here for bronchoscopy because of worsening asthma. She had significant mucus plugging the last time and I suspect she has that again and hopefully this will improve her situation as it did last time we did this Active Problems:   * No active hospital problems. *     Plan: For bronchoscopy with washings and  clear secretions      Leroy Pettway L   01/17/2017, 7:18 AM

## 2017-01-19 LAB — CULTURE, RESPIRATORY W GRAM STAIN

## 2017-01-20 ENCOUNTER — Encounter: Payer: Self-pay | Admitting: Allergy & Immunology

## 2017-01-20 ENCOUNTER — Ambulatory Visit (INDEPENDENT_AMBULATORY_CARE_PROVIDER_SITE_OTHER): Payer: 59 | Admitting: Allergy & Immunology

## 2017-01-20 VITALS — BP 140/78 | HR 98 | Temp 97.8°F | Resp 18 | Ht 61.5 in | Wt 149.0 lb

## 2017-01-20 DIAGNOSIS — J4552 Severe persistent asthma with status asthmaticus: Secondary | ICD-10-CM

## 2017-01-20 MED ORDER — MONTELUKAST SODIUM 10 MG PO TABS: 10.0000 mg | ORAL_TABLET | Freq: Every day | ORAL | 5 refills | Status: DC

## 2017-01-20 MED ORDER — MEPOLIZUMAB 100 MG ~~LOC~~ SOLR
100.0000 mg | SUBCUTANEOUS | Status: DC
  Administered 2017-01-20 – 2018-04-08 (×16): 100 mg via SUBCUTANEOUS

## 2017-01-20 NOTE — Patient Instructions (Addendum)
1. Severe persistent asthma  - Lung function looked terrible today, but it did improve somewhat with albuterol. - We will get some lab work to rule out serious causes of uncontrolled asthma: Alpha-1-Antitrypsin, Hypersensitivity Pneumonitis Panel, serum IgE level, and Aspergillus serologies. - We will obtain an environmental allergen panel to rule out environmental allergies as a cause of your current clinical state. - We gave your first dose of Nucala today (anti-eosinophil medication). - Kathy Tran will contact you to get information from you to get the [place] approved. - We will get a full pulmonary function test at [facility]. - I hope the addition of Nucala will help with weaning off of the prednisone.  - I would also like to start you on Trelegy one inhalation once daily as a controller medication. - We will also add on Singulair 10mg  daily. - Daily controller medication(s): Trelegy one puff once daily + Singulair 10mg  daily - Rescue medications: ProAir 4 puffs every 4-6 hours as needed or DuoNeb nebulizer one vial every 4-6 hours as needed - Asthma control goals:  * Full participation in all desired activities (may need albuterol before activity) * Albuterol use two time or less a week on average (not counting use with activity) * Cough interfering with sleep two time or less a month * Oral steroids no more than once a year * No hospitalizations - Email me in two weeks to let me know how you are doing: [name]  2. Return in about 4 weeks (around 02/18/2017).   Please inform us of any Emergency Department visits, hospitalizations, or changes in symptoms. Call us before going to the ED for breathing or allergy symptoms since we might be able to fit you in for a sick visit. Feel free to contact us anytime with any questions, problems, or concerns.  It was a pleasure to meet you today! Enjoy the upcoming fall season!  Websites that have reliable patient information: 1.  American Academy of Asthma, Allergy, and Immunology: www.aaaai.org 2. Food Allergy Research and Education (FARE): foodallergy.org 3. Mothers of Asthmatics: http://www.asthmacommunitynetwork.org 4. American College of Allergy, Asthma, and Immunology: www.acaai.org   Election Day is coming up on Tuesday, November 6th! Make your voice heard! Register to vote at [email or web address]!

## 2017-01-20 NOTE — Progress Notes (Signed)
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 It is not entirely clear that this is indeed asthma, as there is does not seem to be much improvement even on steroids. However, she is responsive (although not markedly so) to albuterol. She does have some lung nodules, but otherwise imaging has been unremarkable, and these are likely secondary to respiratory infections over the course of her current illness. She is on chronic steroids, and these are clearly taking a toll on her health with type 2 diabetes and Cushingoid facies. Given her course, we need to rule out etiologies of difficult to control asthma, including alpha-1 antitrypsin deficiency, allergic bronchopulmonary aspergillosis, and hypersensitivity pneumonitis.We will also place her on an anti-IL5 drug to see if this provides any improvement in her symptoms and acts as a bridge to help her to get off of glucocorticoids. We will maximize her asthma treatment with an ICS/LABA/LAMA combination as well as montelukast for now, with intentions to decrease this treatment over the course of the next couple of months.   1. Severe persistent asthma  - Lung function looked terrible today, but it did improve somewhat with albuterol. - We will get some lab work to rule out serious causes of uncontrolled asthma: Alpha-1-Antitrypsin, Hypersensitivity Pneumonitis Panel, serum IgE level, and Aspergillus serologies. - We will  obtain an environmental allergen panel to rule out environmental allergies as a cause of your current clinical state. - We gave your first dose of Nucala today (anti-eosinophil medication). - Tammy will contact you to get information from you to get the [place] approved. - We will get a full pulmonary function test at [facility]. - I hope the addition of Nucala will help with weaning off of the prednisone.  - I would also like to start you on Trelegy one inhalation once daily as a controller medication. - We will also add on Singulair 14m daily. - Daily controller medication(s): Trelegy one puff once daily + Singulair 178mdaily - Rescue medications: ProAir 4 puffs every 4-6 hours as needed or DuoNeb nebulizer one vial every 4-6 hours as needed - Asthma control goals:  * Full participation in all desired activities (may need albuterol before activity) * Albuterol use two time or less a week on average (not counting use with activity) * Cough interfering with sleep two time or less a month * Oral steroids no more than once a year * No hospitalizations - Email me in two weeks to let me know how you are doing: [email or web address]  2. Return in about 4 weeks (around 02/18/2017).   Subjective:   Kathy Tran a 89.o. female presenting today for evaluation of  Chief Complaint  Patient presents with  . Cough    started 06/2016 with influenza. ongoing cough and wheeze. does have productivity with the cough. has had bronchoscopy x2    Kathy Tran a history of the following: Patient Active Problem List   Diagnosis Date Noted  .  Asthma exacerbation 12/26/2016  . Bronchitis 09/25/2016  . Asthmatic bronchitis 07/18/2016  . DM (diabetes mellitus) ([place]) 07/18/2016  . GERD (gastroesophageal reflux disease) 07/18/2016  . HLD (hyperlipidemia) 07/18/2016  . HTN (hypertension) 07/18/2016  . History of colonic polyps 06/12/2016    History obtained from: chart review and  patient.  Kathy Tran was referred by Asencion Noble, MD.  Kathy Tran is a 62 y.o. female presenting for evaluation of chronic cough. She was previously healthy, but then developed problems with breathing when she was diagnosed with influenza in February 2018. She did have a fever at the time as well as the traditional symptoms thereof. She has had continuous coughing and wheezing since that time. Sh was hospitalized three times in 2018 at [facility] in March 2018 (six days), May 2018 (six days), and then again three weeks ago (five days). Workups have largely been unrevealing, but she did have positive serologies of Mycoplasma and was treated with doxycyline during the May hospitalization.   During these hospitalizations, it seems that she is placed on IV steroids and continued on nebulizer treatments for days at a time. She did have a chest CT performed during one of the hospitalizations in May 2018. This was negative for pulmonary embolisms. However, there were several nodular opacities in the right lung, with one measuring up to 7 mm. A repeat chest CT was recommended in 6-12 months. There was no evident adenopathy.   During two of these hospitalizations, she has undergone two bronchoscopies with a copious amount of mucous plugging. She has been on a number of steroid courses, and essentially on a constant course since February. Currently she is on 81m prednisone daily. She has gotten down to 167mprednisone at the lowest since February. She did have a low IgG of 421 in May 2018 and was actually given IVIG in the hospital evidently.   She is on Duonebs at home and then albuterol as needed. She was on Symbicort 160/4.5 two puffs twice daily for a short period of time, but this was stopped in March since there was no improvement. She has had an absolute eosinophil count was 300 in May 2018.    Prior to this current episode, around two years ago, she did have a bronchitis diagnosis and was given albuterol to use  for five weeks. However, she improved after this and was asymptomatic for almost two years. Otherwise, she has had no need for albuterol. She never had asthma a child. Her mother died from COPD, but she was a smoker for 50-60 years. Ms. HaKincannonrew up in a smoking home and she worked on a farming house. She smoked herself only socially. She has no new exposures, including pet birds or occupational chemicals. She does have a dog in the home, but this has been the case for quite some time.   [place] since [place]. She has never been allergy tested at all. She has no nasal congestion, rhinorrhea, or ocular symptoms since the surgery occurred in 1998.    Otherwise, there is no history of other atopic diseases, including drug allergies, food allergies, stinging insect allergies, or urticaria. There is no significant infectious history aside from influenza and Mycoplasma over the last six or seven months. Vaccinations are up to date.    Past Medical History: Patient Active Problem List   Diagnosis Date Noted  . Asthma exacerbation 12/26/2016  . Bronchitis 09/25/2016  .  Asthmatic bronchitis 07/18/2016  . DM (diabetes mellitus) ([place]) 07/18/2016  . GERD (gastroesophageal reflux disease) 07/18/2016  . HLD (hyperlipidemia) 07/18/2016  . HTN (hypertension) 07/18/2016  . History of colonic polyps 06/12/2016    Medication List:  Allergies as of 01/20/2017      Reactions   Sulfa Antibiotics Anaphylaxis      Medication List       Accurate as of 01/20/17  1:15 PM. Always use your most recent med list.          aspirin EC 81 MG tablet Take 1 tablet by mouth on Monday, Wednesday, and Friday   carvedilol 25 MG tablet Commonly known as:  COREG Take 25 mg by mouth 2 (two) times daily with a meal.   cetirizine 10 MG tablet Commonly known as:  ZYRTEC Take 10 mg by mouth at bedtime.   glimepiride 2 MG tablet Commonly known as:   AMARYL Take 2 mg by mouth daily.   guaiFENesin 600 MG 12 hr tablet Commonly known as:  MUCINEX Take 1 tablet (600 mg total) by mouth 2 (two) times daily.   hydrochlorothiazide 25 MG tablet Commonly known as:  HYDRODIURIL Take 25 mg by mouth daily.   insulin detemir 100 UNIT/ML injection Commonly known as:  LEVEMIR Inject 0.25 mLs (25 Units total) into the skin every morning.   ipratropium-albuterol 0.5-2.5 (3) MG/3ML Soln Commonly known as:  DUONEB Take 3 mLs by nebulization 3 (three) times daily.   liothyronine 5 MCG tablet Commonly known as:  CYTOMEL Take 5 mcg by mouth every morning.   metFORMIN 500 MG 24 hr tablet Commonly known as:  GLUCOPHAGE-XR Take 1,000 mg by mouth 2 (two) times daily.   montelukast 10 MG tablet Commonly known as:  SINGULAIR Take 1 tablet (10 mg total) by mouth at bedtime.   pantoprazole 40 MG tablet Commonly known as:  PROTONIX Take 1 tablet (40 mg total) by mouth 2 (two) times daily before a meal.   pravastatin 10 MG tablet Commonly known as:  PRAVACHOL Take 10 mg by mouth every evening.   predniSONE 20 MG tablet Commonly known as:  DELTASONE Take 2 tablets (40 mg total) by mouth daily.   VENTOLIN HFA 108 (90 Base) MCG/ACT inhaler Generic drug:  albuterol Inhale 2 puffs into the lungs every 4 (four) hours.   VICTOZA 18 MG/3ML Sopn Generic drug:  liraglutide Inject 1.2 mg into the skin at bedtime.            Discharge Care Instructions        Start     Ordered   01/20/17 1100  MEPOLIZUMAB 100 MG [place] SOLR  Every 28 days     01/20/17 1046   01/20/17 0000  Spirometry with Graph    Question Answer Comment  Where should this test be performed? Other   Basic spirometry No   Spirometry pre & post bronchodilator Yes      01/20/17 1026   01/20/17 0000  Alpha-1-Antitrypsin     01/20/17 1026   01/20/17 0000  Hypersensitivity Pneumonitis     01/20/17 1026   01/20/17 0000  Allergens Zone 2     01/20/17 1026   01/20/17 0000  IgE      01/20/17 1026   01/20/17 0000  Aspergillus IgE Panel     01/20/17 1026   01/20/17 0000  Allergen, A fumigatus IgG     01/20/17 1026   01/20/17 0000  [id number] Aspergillus fumigatus     01/20/17  1026   01/20/17 0000  Pulmonary function test    Question Answer Comment  Where should this test be performed? [name]   Full PFT: includes the following: basic spirometry, spirometry pre & post bronchodilator, diffusion capacity (DLCO), lung volumes Full PFT   MIP/MEP No   6 minute walk Yes   ABG No   Diffusion capacity (DLCO) Yes   Lung volumes Yes   Methacholine challenge No      01/20/17 1026   01/20/17 0000  montelukast (SINGULAIR) 10 MG tablet  Daily at bedtime     01/20/17 1046      Birth History: non-contributory.   Developmental History: non-contributory.   Past Surgical History: Past Surgical History:  Procedure Laterality Date  . [place]  . COLONOSCOPY  03/27/2011   Procedure: COLONOSCOPY;  Surgeon: Rogene Houston, MD;  Location: [facility];  Service: Endoscopy;  Laterality: N/A;  2:00  . CYSTOSCOPY W/ URETERAL STENT PLACEMENT Left 11/08/2015   Procedure: CYSTOSCOPY, URETEROSCOPY, WITH STONE BASKET EXTRACTION;  Surgeon: Raynelle Bring, MD;  Location: [facility];  Service: Urology;  Laterality: Left;  . CYSTOSCOPY/RETROGRADE/URETEROSCOPY/STONE EXTRACTION WITH BASKET Right 07/03/2015   Procedure: CYSTOSCOPY/RETROGRADE/URETEROSCOPY/STONE EXTRACTION WITH BASKET;  Surgeon: Irine Seal, MD;  Location: [facility];  Service: Urology;  Laterality: Right;  . ENDOMETRIAL ABLATION    . FLEXIBLE BRONCHOSCOPY Bilateral 10/31/2016   Procedure: FLEXIBLE BRONCHOSCOPY WITH PROPOFOL;  Surgeon: Sinda Du, MD;  Location: [facility];  Service: Cardiopulmonary;  Laterality: Bilateral;  . KIDNEY STONE SURGERY     multiple  . SHOULDER SURGERY    . TONSILLECTOMY    . TUBAL LIGATION  1991     Family History: Family History  Problem Relation Age of Onset  . COPD  Mother   . Cancer Father      Social History: Ronelle lives at home with her family. They live in 59yo home. There is carpeting throughout the home. They have wood heating and central cooling. There is one dog in the home, whom they have had for 8 years. There are many types of animals outside of the home, including more dogs as well as beef cattle. There are no dust mite coverings on the bedding. There is smoke exposure in the car (patient's husband). 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 However, her current clinical state has caused her to miss quite a bit of work.     Review of Systems: a 14-point review of systems is pertinent for what is mentioned in HPI.  Otherwise, all other systems were negative. Constitutional: negative other than that listed in the HPI Eyes: negative other than that listed in the HPI Ears, nose, mouth, throat, and face: negative other than that listed in the HPI Respiratory: negative other than that listed in the HPI Cardiovascular: negative other than that listed in the HPI Gastrointestinal: negative other than that listed in the HPI Genitourinary: negative other than that listed in the HPI Integument: negative other than that listed in the HPI Hematologic: negative other than that listed in the HPI Musculoskeletal: negative other than that listed in the HPI Neurological: negative other than that listed in the HPI Allergy/Immunologic: negative other than that listed in the HPI    Objective:   Blood pressure 140/78, pulse 98, temperature 97.8 F (36.6 C), temperature source Oral, resp. rate 18, height 5' 1.5" (1.562 m), weight 149 lb (67.6 kg), SpO2 97 %.  Body mass index is 27.7 kg/m.   Physical Exam:  General: Alert, interactive, in no acute distress. Pleasant but clearly short of breath with audible wheezing. Cushingoid facies. Eyes: No conjunctival injection present on the right, No conjunctival  injection present on the left, PERRL bilaterally, No discharge on the right, No discharge on the left and No Horner-Trantas dots present Ears: Right TM pearly gray with normal light reflex, Left TM pearly gray with normal light reflex, Left TM intact without perforation and Right TM unable to be visualized due to cerumen impaction.  Nose/Throat: External nose within normal limits and septum midline, turbinates edematous and pale without discharge, post-pharynx mildly erythematous without cobblestoning in the posterior oropharynx. Tonsils 2+ without exudates Neck: Supple without thyromegaly.  Adenopathy: shoddy bilateral anterior cervical lymphadenopathy. and no enlarged lymph nodes appreciated in the occipital, axillary, epitrochlear, inguinal, or popliteal regions. Lungs: Decreased breath sounds with expiratory wheezing bilaterally. Increased work of breathing. CV: Normal S1/S2, no murmurs. Capillary refill <2 seconds.  Abdomen: Nondistended, nontender. No guarding or rebound tenderness. Bowel sounds present in all fields and hypoactive  Skin: Warm and dry, without lesions or rashes. Extremities:  No clubbing, cyanosis or edema. Neuro:   Grossly intact. No focal deficits appreciated. Responsive to questions.  Diagnostic studies:   Spirometry: results abnormal (FEV1: 1.50/63%, FVC: 2.02/65%, FEV1/FVC: 74%).    Spirometry consistent with normal pattern. Albuterol nebulizer treatment given in clinic with improvement, although it did not reach statistical significance. Her FEV1 increased 11% and her FVC increased 5%. Her FEF 25-75% improved 52%, however.  Allergy Studies: none (deferred due to current pulmonary state)     Salvatore Marvel, MD [facility]

## 2017-01-21 ENCOUNTER — Encounter (HOSPITAL_COMMUNITY): Payer: Self-pay | Admitting: Pulmonary Disease

## 2017-01-24 DIAGNOSIS — J45901 Unspecified asthma with (acute) exacerbation: Secondary | ICD-10-CM | POA: Diagnosis not present

## 2017-01-24 DIAGNOSIS — E119 Type 2 diabetes mellitus without complications: Secondary | ICD-10-CM | POA: Diagnosis not present

## 2017-01-24 LAB — IGE+ALLERGENS ZONE 2(30)
Amer Sycamore IgE Qn: <0.1 kU/L
Aspergillus Fumigatus IgE: <0.1 kU/L
Bahia Grass IgE: <0.1 kU/L
Cat Dander IgE: <0.1 kU/L
Cladosporium Herbarum IgE: <0.1 kU/L
Cockroach, American IgE: <0.1 kU/L
D Farinae IgE: <0.1 kU/L
Elm, American IgE: <0.1 kU/L
Hickory, White IgE: <0.1 kU/L
Maple/Box Elder IgE: <0.1 kU/L
Penicillium Chrysogen IgE: <0.1 kU/L
Pigweed, Rough IgE: <0.1 kU/L
Sheep Sorrel IgE Qn: <0.1 kU/L
White Mulberry IgE: <0.1 kU/L

## 2017-01-24 LAB — HYPERSENSITIVITY PNEUMONITIS
A. Pullulans Abs: NEGATIVE
A.Fumigatus #1 Abs: NEGATIVE
MICROPOLYSPORA FAENI IGG: NEGATIVE
PIGEON SERUM ABS: NEGATIVE
THERMOACT. SACCHARII: NEGATIVE
Thermoactinomyces vulgaris, IgG: NEGATIVE

## 2017-01-24 LAB — ASPERGILLUS IGE PANEL
A. AMSTEL/GLAUCU CLASS INTERP: 0
A. Flavus Class Interp: 0
A. Fumigatus Class Interp: 0
A. Nidulans Class Interp: 0
A. Niger Class Interp: 0
A. Terreus Class Interp: 0
A. VERSICOLOR CLASS INTERP: 0
Aspergillus amstel/glaucu IgE*: <0.35 kU/L (ref <0.35)
Aspergillus nidulans IgE: <0.35 kU/L (ref <0.35)
Aspergillus niger IgE: <0.1 kU/L (ref <0.35)
Aspergillus terreus IgE: <0.35 kU/L (ref <0.35)

## 2017-01-24 LAB — ALPHA-1-ANTITRYPSIN: A-1 Antitrypsin: 113 mg/dL (ref 90–200)

## 2017-01-24 LAB — ALLERGEN A FUMIGATUS IGG: Aspergillus fumigatus IgG: 7.8 ug/mL — ABNORMAL HIGH (ref 0.0–1.9)

## 2017-01-29 ENCOUNTER — Ambulatory Visit (HOSPITAL_COMMUNITY)
Admission: RE | Admit: 2017-01-29 | Discharge: 2017-01-29 | Disposition: A | Discharge status: Home / Self Care | Payer: 59 | Source: Ambulatory Visit | Attending: Allergy & Immunology | Admitting: Allergy & Immunology

## 2017-01-29 DIAGNOSIS — J449 Chronic obstructive pulmonary disease, unspecified: Secondary | ICD-10-CM | POA: Diagnosis not present

## 2017-01-29 DIAGNOSIS — J4552 Severe persistent asthma with status asthmaticus: Secondary | ICD-10-CM | POA: Insufficient documentation

## 2017-01-29 MED ORDER — ALBUTEROL SULFATE (2.5 MG/3ML) 0.083% IN NEBU
2.5000 mg | INHALATION_SOLUTION | Freq: Once | RESPIRATORY_TRACT | Status: AC
  Administered 2017-01-29: 2.5 mg via RESPIRATORY_TRACT

## 2017-01-30 LAB — PULMONARY FUNCTION TEST
DL/VA % pred: 102 %
DL/VA: 4.66 ml/min/mmHg/L
DLCO UNC % PRED: 79 %
DLCO UNC: 17.16 ml/min/mmHg
DLCO cor % pred: 79 %
DLCO cor: 17.16 ml/min/mmHg
FEF 25-75 PRE: 0.97 L/s
FEF 25-75 Post: 1.13 L/sec
FEF2575-%Change-Post: 17 %
FEF2575-%PRED-POST: 49 %
FEF2575-%Pred-Pre: 42 %
FEV1-%Change-Post: 5 %
FEV1-%PRED-POST: 66 %
FEV1-%PRED-PRE: 63 %
FEV1-POST: 1.6 L
FEV1-PRE: 1.52 L
FEV1FVC-%CHANGE-POST: 0 %
FEV1FVC-%Pred-Pre: 87 %
FEV6-%CHANGE-POST: 6 %
FEV6-%PRED-POST: 78 %
FEV6-%PRED-PRE: 73 %
FEV6-POST: 2.36 L
FEV6-PRE: 2.21 L
FEV6FVC-%CHANGE-POST: 0 %
FEV6FVC-%PRED-POST: 103 %
FEV6FVC-%PRED-PRE: 103 %
FVC-%CHANGE-POST: 6 %
FVC-%Pred-Post: 76 %
FVC-%Pred-Pre: 71 %
FVC-Post: 2.36 L
FVC-Pre: 2.22 L
POST FEV6/FVC RATIO: 100 %
Post FEV1/FVC ratio: 68 %
Pre FEV1/FVC ratio: 68 %
Pre FEV6/FVC Ratio: 100 %
RV % PRED: 97 %
RV: 1.81 L
TLC % PRED: 90 %
TLC: 4.28 L

## 2017-02-04 LAB — FUNGUS CULTURE RESULT

## 2017-02-04 LAB — FUNGUS CULTURE WITH STAIN

## 2017-02-04 LAB — FUNGAL ORGANISM REFLEX

## 2017-02-10 ENCOUNTER — Other Ambulatory Visit: Payer: Self-pay

## 2017-02-10 MED ORDER — FLUTICASONE-UMECLIDIN-VILANT 100-62.5-25 MCG/INH IN AEPB: 1.0000 | INHALATION_SPRAY | Freq: Every day | RESPIRATORY_TRACT | 0 refills | Status: DC

## 2017-02-10 NOTE — Telephone Encounter (Signed)
Patient emailed Dr. Ernst Bowler directly to request a refill of Trelegy Ellipta inhaler. I have sent that in, per his request. Patient is scheduled for next Tuesday in the [place] office.

## 2017-02-10 NOTE — Telephone Encounter (Signed)
Per Dr. Ernst Bowler, if the Trelegy Ellipta is not covered we can go ahead and send in Breo 200/25 mcg instead.

## 2017-02-14 DIAGNOSIS — E119 Type 2 diabetes mellitus without complications: Secondary | ICD-10-CM | POA: Diagnosis not present

## 2017-02-18 ENCOUNTER — Encounter: Payer: Self-pay | Admitting: Allergy & Immunology

## 2017-02-18 ENCOUNTER — Other Ambulatory Visit: Payer: Self-pay | Admitting: *Deleted

## 2017-02-18 ENCOUNTER — Ambulatory Visit (INDEPENDENT_AMBULATORY_CARE_PROVIDER_SITE_OTHER): Payer: 59 | Admitting: Allergy & Immunology

## 2017-02-18 VITALS — BP 118/80 | HR 100 | Resp 20

## 2017-02-18 DIAGNOSIS — B999 Unspecified infectious disease: Secondary | ICD-10-CM

## 2017-02-18 DIAGNOSIS — J310 Chronic rhinitis: Secondary | ICD-10-CM | POA: Diagnosis not present

## 2017-02-18 DIAGNOSIS — J4550 Severe persistent asthma, uncomplicated: Secondary | ICD-10-CM

## 2017-02-18 DIAGNOSIS — Z7952 Long term (current) use of systemic steroids: Secondary | ICD-10-CM | POA: Diagnosis not present

## 2017-02-18 MED ORDER — FLUTICASONE-UMECLIDIN-VILANT 100-62.5-25 MCG/INH IN AEPB: 1.0000 | INHALATION_SPRAY | Freq: Every day | RESPIRATORY_TRACT | 5 refills | Status: DC

## 2017-02-18 NOTE — Patient Instructions (Addendum)
1. 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 - I will defer a prednisone wean to Dr. Luan Pulling.  - Please give my work cell to Dr. Luan Pulling so that we can discuss your case [phone]).  - I am still unsure what is going on with you, but we will continue to work on it.  - We will continue you on the same medications.  - Daily controller medication(s): Trelegy one puff once daily + Singulair 10mg  daily - Rescue medications: ProAir 4 puffs every 4-6 hours as needed or DuoNeb nebulizer one vial every 4-6 hours as needed - Asthma control goals:  * Full participation in all desired activities (may need albuterol before activity) * Albuterol use two time or less a week on average (not counting use with activity) * Cough interfering with sleep two time or less a month * Oral steroids no more than once a year * No hospitalizations  2. Chronic rhinitis - with negative blood environmental allergen testing. - Continue with Benadryl 25mg  once daily.  - We can consider skin allergy testing once your pulmonary status is more stable.  3. Return in about 2 months (around 04/20/2017).   Please inform us of any Emergency Department visits, hospitalizations, or changes in symptoms. Call us before going to the ED for breathing or allergy symptoms since we might be able to fit you in for a sick visit. Feel free to contact us anytime with any questions, problems, or concerns.  It was a pleasure to see you again today! Enjoy the upcoming fall season!  Websites that have reliable patient information: 1. American Academy of Asthma, Allergy, and Immunology: www.aaaai.org 2. Food Allergy Research and Education (FARE): foodallergy.org 3. Mothers of Asthmatics: http://www.asthmacommunitynetwork.org 4. American College of Allergy, Asthma, and Immunology: www.acaai.org   Election Day is coming up on Tuesday, November 6th! Make your voice heard! Register to vote at  [email or web address]!

## 2017-02-18 NOTE — Progress Notes (Signed)
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 - I will defer a prednisone wean to Dr. Luan Pulling.  - Please give my work cell to Dr. Luan Pulling so that we can discuss your case [phone]).  - I am still unclear as to the underlying cause of Ms. Blea's symptoms. - We will continue you on the same medications.  - Daily controller medication(s): Trelegy one puff once daily + Singulair 59m daily - Rescue medications: ProAir 4 puffs every 4-6 hours as needed or DuoNeb nebulizer one vial every 4-6 hours as needed - Asthma control goals:  * Full participation in all desired activities (may need albuterol before activity) * Albuterol use two time or less a week on average (not counting use with activity) * Cough interfering with sleep two time or less a month * Oral steroids no more than once a year * No hospitalizations  2. Chronic rhinitis - with negative blood environmental allergen testing. - Continue with Benadryl 228monce daily.  - We can consider skin allergy testing once your pulmonary status is more stable.  3. Chronic steroid dependence - We may consider a referral to Dr. NiDorris Fetcho help with the prednisone wean. - Will discuss with Dr. HaLuan Pullingrior to doing this.  4. History of hypogammaglobulinemia - We will recheck the immunoglobulin levels at the next visit. - I anticipate that the low  levels are secondary to the prednisone. - Infectious history is largely unremarkable.  5. Multiple lung nodules in the right lung lobes - Anticipate a recheck in the spring 2019. - Patient follows closely with a pulmonologist.    6. Return in about 2 months (around 04/20/2017).   Subjective:   LoAMIEE WILEYs a [age].o. female presenting today for follow up of  Chief Complaint  Patient presents with  . Asthma    LoBeryle Quantas a history of the following: Patient Active Problem List   Diagnosis Date Noted  . Asthma exacerbation 12/26/2016  . Bronchitis 09/25/2016  . Asthmatic bronchitis 07/18/2016  . [place] mellitus) [place]/12[place]2016  . GERD (gastroesophageal reflux disease) 07/18/2016  . HLD (hyperlipidemia) 07/18/2016  . HTN (hypertension) 07/18/2016  . History of colonic polyps 06/12/2016    History obtained from: chart review and patient.  LoJorge Mandrilarris's Primary Care Provider is FaAsencion NobleMD.     LoKendelles a [age].o. female presenting for a follow up visit. She was seen for her first appointment in September 2018. At that time, she had a prolonged history of shortness of breath and wheezing including a seven-month history of steroid dependence. She had undergone 2 bronchoscopies with unenlightening workups. Secondary to her steroids, she was developing type 2 diabetes as well as cushingoid facies. Her previous history of asthma was very vague, and really only included one episode of bronchitis. We obtain lab work to rule out serious causes of asthma including  allergic bronchopulmonary aspergillosis, alpha-1 antitrypsin deficiency, and hypersensitivity pneumonitis. Aside from slightly elevated IgG to aspergillosis, her workup was entirely normal. She did have a history of an elevated absolute eosinophil count of 300, therefore we started her on Nucala at that visit. Her smoking history was rather vague, and she reported smoking only socially. However she grew up with both  parents smoking and her current husband smokes as well. Because of her terrible breathing status, we started her on Trelegy 1 puff once daily.  Since the last visit, she has done fairly well. She is using her DuoNeb nebulizer 2-3 times daily. She does feel that she needs it and this is what Dr. Luan Pulling recommended. Activity level has been fairly good. she feels that the addition of the inhaler has helped her tremendously. However, she remains on prednisone 20 mg daily. It is not clear who is managing the spleen. She does have an appointment with Dr. Luan Pulling next week, where some of these issues will be figured out. Her insurance has approved Nucala, but the medication is not shifted this time. At this time, she is not paying anything for her medications because she met her deductible with her previous 2 hospital admissions.   She does report congestion every morning. She takes Benadryl at night for her allergy symptoms, which seems to control her congestion fairly well. She does not use a nasal spray as she has not tolerated these in the past. Her environmental allergy panel was negative to all of the allergens tested.  Otherwise, there have been no changes to her past medical history, surgical history, family history, or social history.    Review of Systems: a 14-point review of systems is pertinent for what is mentioned in HPI.  Otherwise, all other systems were negative. Constitutional: negative other than that listed in the HPI Eyes: negative other than that listed in the HPI Ears, nose, mouth, throat, and face: negative other than that listed in the HPI Respiratory: negative other than that listed in the HPI Cardiovascular: negative other than that listed in the HPI Gastrointestinal: negative other than that listed in the HPI Genitourinary: negative other than that listed in the HPI Integument: negative other than that listed in the HPI Hematologic: negative other than that listed in the  HPI Musculoskeletal: negative other than that listed in the HPI Neurological: negative other than that listed in the HPI Allergy/Immunologic: negative other than that listed in the HPI    Objective:   Blood pressure 118/80, pulse 100, resp. rate 20, SpO2 96 %. There is no height or weight on file to calculate BMI.   Physical Exam:  General: Alert, interactive, in no acute distress. Eyes: No conjunctival injection bilaterally, no discharge on the right, no discharge on the left and no Horner-Trantas dots present. PERRL bilaterally. EOMI without pain. No photophobia.  Ears: Right TM pearly gray with normal light reflex, Left TM pearly gray with normal light reflex, Right TM intact without perforation and Left TM intact without perforation.  Nose/Throat: External nose within normal limits and septum midline. Turbinates markedly edematous with clear discharge. Posterior oropharynx erythematous without cobblestoning in the posterior oropharynx. Tonsils 2+ without exudates.  Tongue without thrush. Adenopathy: shoddy bilateral anterior cervical lymphadenopathy Lungs: Clear to auscultation without wheezing, rhonchi or rales. No increased work of breathing. CV: Normal S1/S2. No murmurs. Capillary refill <2 seconds.  Skin: Warm and dry, without lesions or rashes. Neuro:   Grossly intact. No focal deficits appreciated. Responsive to questions.  Diagnostic studies:   Spirometry: results abnormal (FEV1: 1.60/74%, FVC: 2.56/99%, FEV1/FVC: 62%).    Spirometry consistent with mild obstructive disease.   Allergy Studies: none     Salvatore Marvel, MD [facility]

## 2017-02-21 NOTE — Addendum Note (Signed)
Addended by: [name] on: 02/21/2017 09:52 AM   Modules accepted: Orders

## 2017-02-21 NOTE — Progress Notes (Signed)
I talked to Kathy Tran and she is interested in getting an Endocrinology referral for help with her prednisone wean as well as her diabetes management. Referral placed and route to our Referral Coordinator.   Salvatore Marvel, MD All[facility] and [facility]

## 2017-02-25 ENCOUNTER — Ambulatory Visit (INDEPENDENT_AMBULATORY_CARE_PROVIDER_SITE_OTHER): Payer: 59

## 2017-02-25 DIAGNOSIS — B999 Unspecified infectious disease: Secondary | ICD-10-CM | POA: Diagnosis not present

## 2017-02-25 DIAGNOSIS — J4550 Severe persistent asthma, uncomplicated: Secondary | ICD-10-CM | POA: Diagnosis not present

## 2017-02-25 DIAGNOSIS — Z7952 Long term (current) use of systemic steroids: Secondary | ICD-10-CM | POA: Diagnosis not present

## 2017-02-25 NOTE — Addendum Note (Signed)
Addended by: [name] on: 02/25/2017 09:50 AM   Modules accepted: Orders

## 2017-02-25 NOTE — Addendum Note (Signed)
Addended by: [name] on: 02/25/2017 10:02 AM   Modules accepted: Orders

## 2017-02-26 DIAGNOSIS — I10 Essential (primary) hypertension: Secondary | ICD-10-CM | POA: Diagnosis not present

## 2017-02-26 DIAGNOSIS — E1165 Type 2 diabetes mellitus with hyperglycemia: Secondary | ICD-10-CM | POA: Diagnosis not present

## 2017-02-26 DIAGNOSIS — J4551 Severe persistent asthma with (acute) exacerbation: Secondary | ICD-10-CM | POA: Diagnosis not present

## 2017-02-26 LAB — COMPREHENSIVE METABOLIC PANEL
A/G RATIO: 2.3 — AB (ref 1.2–2.2)
ALBUMIN: 4.2 g/dL (ref 3.5–5.5)
ALT: 24 IU/L (ref 0–32)
AST: 15 IU/L (ref 0–40)
Alkaline Phosphatase: 41 IU/L (ref 39–117)
BILIRUBIN TOTAL: 0.5 mg/dL (ref 0.0–1.2)
BUN / CREAT RATIO: 20 (ref 9–23)
BUN: 15 mg/dL (ref 6–24)
CALCIUM: 9.4 mg/dL (ref 8.7–10.2)
CHLORIDE: 99 mmol/L (ref 96–106)
CO2: 25 mmol/L (ref 20–29)
Creatinine, Ser: 0.76 mg/dL (ref 0.57–1.00)
GFR, EST AFRICAN AMERICAN: 100 mL/min/{1.73_m2} (ref >59)
GFR, EST NON AFRICAN AMERICAN: 87 mL/min/{1.73_m2} (ref >59)
Globulin, Total: 1.8 g/dL (ref 1.5–4.5)
Glucose: 261 mg/dL — ABNORMAL HIGH (ref 65–99)
Potassium: 3.9 mmol/L (ref 3.5–5.2)
Sodium: 140 mmol/L (ref 134–144)
TOTAL PROTEIN: 6 g/dL (ref 6.0–8.5)

## 2017-02-26 LAB — HEMOGLOBIN A1C
Est. average glucose Bld gHb Est-mCnc: 295 mg/dL
Hgb A1c MFr Bld: 11.9 % — ABNORMAL HIGH (ref 4.8–5.6)

## 2017-03-03 LAB — STREP PNEUMONIAE 23 SEROTYPES IGG
PNEUMO AB TYPE 14: 0.3 ug/mL — AB (ref >1.3)
PNEUMO AB TYPE 17 (17F): 0.3 ug/mL — AB (ref >1.3)
PNEUMO AB TYPE 19 (19F): 0.4 ug/mL — AB (ref >1.3)
PNEUMO AB TYPE 20: 0.5 ug/mL — AB (ref >1.3)
PNEUMO AB TYPE 2: 0.6 ug/mL — AB (ref >1.3)
PNEUMO AB TYPE 34 (10A): 0.9 ug/mL — AB (ref >1.3)
PNEUMO AB TYPE 3: 0.4 ug/mL — AB (ref >1.3)
PNEUMO AB TYPE 51 (7F): 0.3 ug/mL — AB (ref >1.3)
PNEUMO AB TYPE 56 (18C): 1.1 ug/mL — AB (ref >1.3)
PNEUMO AB TYPE 57 (19A): 1.4 ug/mL (ref >1.3)
PNEUMO AB TYPE 68 (9V): 0.6 ug/mL — AB (ref >1.3)
Pneumo Ab Type 1*: 0.2 ug/mL — ABNORMAL LOW (ref >1.3)
Pneumo Ab Type 12 (12F)*: 0.1 ug/mL — ABNORMAL LOW (ref >1.3)
Pneumo Ab Type 23 (23F)*: 0.1 ug/mL — ABNORMAL LOW (ref >1.3)
Pneumo Ab Type 26 (6B)*: <0.1 ug/mL — ABNORMAL LOW (ref >1.3)
Pneumo Ab Type 4*: <0.1 ug/mL — ABNORMAL LOW (ref >1.3)
Pneumo Ab Type 43 (11A)*: <0.1 ug/mL — ABNORMAL LOW (ref >1.3)
Pneumo Ab Type 5*: <0.2 ug/mL — ABNORMAL LOW (ref >1.3)
Pneumo Ab Type 70 (33F)*: 0.5 ug/mL — ABNORMAL LOW (ref >1.3)
Pneumo Ab Type 8*: 0.3 ug/mL — ABNORMAL LOW (ref >1.3)
Pneumo Ab Type 9 (9N)*: <0.1 ug/mL — ABNORMAL LOW (ref >1.3)

## 2017-03-03 LAB — COMPLEMENT, TOTAL: Compl, Total (CH50): >60 U/mL (ref >41)

## 2017-03-03 LAB — IGG, IGA, IGM
IGM (IMMUNOGLOBULIN M), SRM: 48 mg/dL (ref 26–217)
IgA/Immunoglobulin A, Serum: 89 mg/dL (ref 87–352)
IgG (Immunoglobin G), Serum: 398 mg/dL — ABNORMAL LOW (ref 700–1600)

## 2017-03-03 LAB — DIPHTHERIA / TETANUS ANTIBODY PANEL: TETANUS AB, IGG: 1.06 [IU]/mL (ref <0.10)

## 2017-03-03 LAB — HAEMOPHILIUS INFLUENZAE B AB IGG

## 2017-03-05 DIAGNOSIS — J4552 Severe persistent asthma with status asthmaticus: Secondary | ICD-10-CM | POA: Diagnosis not present

## 2017-03-05 DIAGNOSIS — E119 Type 2 diabetes mellitus without complications: Secondary | ICD-10-CM | POA: Diagnosis not present

## 2017-03-07 DIAGNOSIS — N200 Calculus of kidney: Secondary | ICD-10-CM | POA: Diagnosis not present

## 2017-03-12 DIAGNOSIS — J4550 Severe persistent asthma, uncomplicated: Secondary | ICD-10-CM | POA: Diagnosis not present

## 2017-03-13 ENCOUNTER — Telehealth: Payer: Self-pay | Admitting: Allergy & Immunology

## 2017-03-13 NOTE — Telephone Encounter (Signed)
Let's go with Cuvitru. Let's start with 600mg /kg/month. I know it is high, but we can back off later if needed.  Thanks, Salvatore Marvel, MD All[facility] and [facility]

## 2017-03-13 NOTE — Telephone Encounter (Signed)
Will submit and be in contact with patient

## 2017-03-13 NOTE — Telephone Encounter (Signed)
I contacted patient and advised I would be submitting for approval for SCIG therapy.  I have contacted PCP and have her record in hand for vaccine done in July from PCP.  I was wondering if you have a product preference and dose preference for patient if not I will pick.

## 2017-03-13 NOTE — Telephone Encounter (Signed)
I talked to Kathy Tran about her labs. She reports that she definitely have Pneumovax in July during one of her hospitalizations. Therefore, the labs we obtained are essentially her post vaccine titers. She also thinks that she got her Tetanus booster within the last two years. We will call her PCP to get vaccination records faxed over so that we have all of the dates. I will also talk to [place] about getting immunoglobulin replacement approved, given the new time course of the Pneumovax.  Salvatore Marvel, MD Allergy and [facility] [place]

## 2017-03-20 ENCOUNTER — Encounter: Payer: Self-pay | Admitting: "Endocrinology

## 2017-03-20 ENCOUNTER — Ambulatory Visit (INDEPENDENT_AMBULATORY_CARE_PROVIDER_SITE_OTHER): Payer: 59 | Admitting: "Endocrinology

## 2017-03-20 VITALS — BP 137/85 | HR 116 | Ht 61.5 in | Wt 154.0 lb

## 2017-03-20 DIAGNOSIS — E782 Mixed hyperlipidemia: Secondary | ICD-10-CM

## 2017-03-20 DIAGNOSIS — E1159 Type 2 diabetes mellitus with other circulatory complications: Secondary | ICD-10-CM | POA: Diagnosis not present

## 2017-03-20 DIAGNOSIS — I10 Essential (primary) hypertension: Secondary | ICD-10-CM | POA: Insufficient documentation

## 2017-03-20 MED ORDER — INSULIN DETEMIR 100 UNIT/ML ~~LOC~~ SOLN: 40.0000 [IU] | Freq: Every day | SUBCUTANEOUS | 2 refills | Status: DC

## 2017-03-20 NOTE — Progress Notes (Signed)
Consult Note       03/20/2017, 4:41 PM   Subjective:    Patient ID: Kathy Tran, female    DOB: [birth year].  Kathy Tran is being seen in consultation for management of currently uncontrolled symptomatic diabetes requested by  Asencion Noble, MD.   Past Medical History:  Diagnosis Date  . Asthma    Flu march and May 2018  . Diabetes mellitus type 2, controlled ([place])    x5 yrs  . Eczema   . Heart murmur   . Hypertension    x 5 yrs  . Recurrent upper respiratory infection (URI)   . Renal disorder    renal stones   Past Surgical History:  Procedure Laterality Date  . [place]  . ENDOMETRIAL ABLATION    . KIDNEY STONE SURGERY     multiple  . SHOULDER SURGERY    . TONSILLECTOMY    . TUBAL LIGATION  1991   Social History   Socioeconomic History  . Marital status: Married    Spouse name: None  . Number of children: None  . Years of education: None  . Highest education level: None  Social Needs  . Financial resource strain: None  . Food insecurity - worry: None  . Food insecurity - inability: None  . Transportation needs - medical: None  . Transportation needs - non-medical: None  Occupational History  . None  Tobacco Use  . Smoking status: Never Smoker  . Smokeless tobacco: Never Used  Substance and Sexual Activity  . Alcohol use: Yes    Comment: occ  . Drug use: No  . Sexual activity: Yes    Birth control/protection: Surgical  Other Topics Concern  . None  Social History Narrative  . None   Outpatient Encounter Medications as of 03/20/2017  Medication Sig  . aspirin EC 81 MG tablet Take 1 tablet by mouth on Monday, Wednesday, and Friday  . carvedilol (COREG) 25 MG tablet Take 25 mg by mouth 2 (two) times daily with a meal.  . cetirizine (ZYRTEC) 10 MG tablet Take 10 mg by mouth at bedtime.  . fluconazole (DIFLUCAN) 100 MG tablet   .  Fluticasone-Umeclidin-Vilant (TRELEGY ELLIPTA) 100-62.5-25 MCG/INH AEPB Inhale 1 puff into the lungs daily.  Marland Kitchen guaiFENesin (MUCINEX) 600 MG 12 hr tablet Take 1 tablet (600 mg total) by mouth 2 (two) times daily.  . hydrochlorothiazide (HYDRODIURIL) 25 MG tablet Take 25 mg by mouth daily.   . insulin detemir (LEVEMIR) 100 UNIT/ML injection Inject 0.4 mLs (40 Units total) at bedtime into the skin.  Marland Kitchen ipratropium-albuterol (DUONEB) 0.5-2.5 (3) MG/3ML SOLN Take 3 mLs by nebulization 3 (three) times daily.  . metFORMIN (GLUCOPHAGE-XR) 500 MG 24 hr tablet Take 1,000 mg by mouth 2 (two) times daily.  . montelukast (SINGULAIR) 10 MG tablet Take 1 tablet (10 mg total) by mouth at bedtime.  . pantoprazole (PROTONIX) 40 MG tablet Take 1 tablet (40 mg total) by mouth 2 (two) times daily before a meal.  . pravastatin (PRAVACHOL) 10 MG tablet Take 10 mg by mouth every evening.  . VENTOLIN HFA 108 (  90 Base) MCG/ACT inhaler Inhale 2 puffs into the lungs every 4 (four) hours.   Marland Kitchen VICTOZA 18 MG/3ML SOPN Inject 1.2 mg into the skin at bedtime.   . [DISCONTINUED] glimepiride (AMARYL) 2 MG tablet Take 2 mg by mouth daily.  . [DISCONTINUED] insulin detemir (LEVEMIR) 100 UNIT/ML injection Inject 0.25 mLs (25 Units total) into the skin every morning.  . [DISCONTINUED] liothyronine (CYTOMEL) 5 MCG tablet Take 5 mcg by mouth every morning.    Facility-Administered Encounter Medications as of 03/20/2017  Medication  . Mepolizumab SOLR 100 mg    ALLERGIES: Allergies  Allergen Reactions  . Sulfa Antibiotics Anaphylaxis    VACCINATION STATUS:  There is no immunization history on file for this patient.  Diabetes  She presents for her initial diabetic visit. She has type 2 diabetes mellitus. Onset time: She was diagnosed at approximate age of 63 years. Her disease course has been worsening (Worsening due to concurrent therapy with prednisone 20 mg daily related to her COPD.). There are no hypoglycemic associated  symptoms. Pertinent negatives for hypoglycemia include no confusion, headaches, pallor or seizures. Associated symptoms include fatigue, polydipsia and polyuria. Pertinent negatives for diabetes include no blurred vision, no chest pain and no polyphagia. There are no hypoglycemic complications. Symptoms are worsening. There are no diabetic complications. Risk factors for coronary artery disease include diabetes mellitus, hypertension, sedentary lifestyle, tobacco exposure and dyslipidemia. Current diabetic treatment includes oral agent (monotherapy) and insulin injections. Her weight is increasing steadily. She is following a generally unhealthy diet. When asked about meal planning, she reported none. She has not had a previous visit with a dietitian. She never participates in exercise. Her home blood glucose trend is increasing rapidly. Her overall blood glucose range is >200 mg/dl. (She came with the blood glucose logs showing persistent hyperglycemia averaging above 200 mg/dL. Her most recent A1c was 11.9%.) An ACE inhibitor/angiotensin II receptor blocker is not being taken. She does not see a podiatrist.Eye exam is not current.  Hypertension  This is a chronic problem. The current episode started more than 1 year ago. The problem is controlled. Pertinent negatives include no blurred vision, chest pain, headaches, palpitations or shortness of breath. Risk factors for coronary artery disease include diabetes mellitus, dyslipidemia, family history, sedentary lifestyle, smoking/tobacco exposure and post-menopausal state. Past treatments include diuretics.  Hyperlipidemia  This is a chronic problem. The current episode started more than 1 year ago. Exacerbating diseases include diabetes. Pertinent negatives include no chest pain, myalgias or shortness of breath. Current antihyperlipidemic treatment includes statins. Risk factors for coronary artery disease include diabetes mellitus, dyslipidemia, hypertension,  post-menopausal, a sedentary lifestyle and family history.    Review of Systems  Constitutional: Positive for fatigue. Negative for chills, fever and unexpected weight change.  HENT: Negative for trouble swallowing and voice change.   Eyes: Negative for blurred vision and visual disturbance.  Respiratory: Negative for cough, shortness of breath and wheezing.   Cardiovascular: Negative for chest pain, palpitations and leg swelling.  Gastrointestinal: Negative for diarrhea, nausea and vomiting.  Endocrine: Positive for polydipsia and polyuria. Negative for cold intolerance, heat intolerance and polyphagia.  Musculoskeletal: Negative for arthralgias and myalgias.  Skin: Negative for color change, pallor, rash and wound.  Neurological: Negative for seizures and headaches.  Psychiatric/Behavioral: Negative for confusion and suicidal ideas.    Objective:    BP 137/85   Pulse (!) 116   Ht 5' 1.5" (1.562 m)   Wt 154 lb (69.9 kg)   BMI  28.63 kg/m   Wt Readings from Last 3 Encounters:  03/20/17 154 lb (69.9 kg)  01/20/17 149 lb (67.6 kg)  01/17/17 149 lb (67.6 kg)     Physical Exam  Constitutional: She is oriented to person, place, and time. She appears well-developed.  HENT:  Head: Normocephalic and atraumatic.  Eyes: EOM are normal.  Neck: Normal range of motion. Neck supple. No tracheal deviation present. No thyromegaly present.  Cardiovascular: Normal rate and regular rhythm.  Pulmonary/Chest: Effort normal. She has wheezes. She has rales.  Abdominal: Soft. Bowel sounds are normal. There is no tenderness. There is no guarding.  Musculoskeletal: Normal range of motion. She exhibits no edema.  Neurological: She is alert and oriented to person, place, and time. She has normal reflexes. No cranial nerve deficit. Coordination normal.  Skin: Skin is warm and dry. No rash noted. No erythema. No pallor.  Psychiatric: She has a normal mood and affect. Judgment normal.   CMP ( most  recent) CMP     Component Value Date/Time   NA 140 02/25/2017 1044   K 3.9 02/25/2017 1044   CL 99 02/25/2017 1044   CO2 25 02/25/2017 1044   GLUCOSE 261 (H) 02/25/2017 1044   GLUCOSE 456 (H) 12/27/2016 0846   BUN 15 02/25/2017 1044   CREATININE 0.76 02/25/2017 1044   CALCIUM 9.4 02/25/2017 1044   PROT 6.0 02/25/2017 1044   ALBUMIN 4.2 02/25/2017 1044   AST 15 02/25/2017 1044   ALT 24 02/25/2017 1044   ALKPHOS 41 02/25/2017 1044   BILITOT 0.5 02/25/2017 1044   GFRNONAA 87 02/25/2017 1044   GFRAA 100 02/25/2017 1044     Diabetic Labs (most recent): Lab Results  Component Value Date   HGBA1C 11.9 (H) 02/25/2017     Lab Results  Component Value Date   TSH 2.715 09/25/2016   TSH 0.823 07/18/2016      Assessment & Plan:   1. DM type 2 causing vascular disease ([place])  - Kathy Tran has currently uncontrolled symptomatic type 2 DM since  61 years of age,  with most recent A1c of 11.9 %. Recent labs reviewed.  -her diabetes is complicated by recent requirement for high-dose steroids related to her COPD, sedentary life, and IVIE SAVITT remains at a high risk for more acute and chronic complications which include CAD, CVA, CKD, retinopathy, and neuropathy. These are all discussed in detail with the patient.  - I have counseled her on diet management and weight loss, by adopting a carbohydrate restricted/protein rich diet.  - Suggestion is made for her to avoid simple carbohydrates  from her diet including Cakes, Sweet Desserts, Ice Cream, Soda (diet and regular), Sweet Tea, Candies, Chips, Cookies, Store Bought Juices, Alcohol in Excess of  1-2 drinks a day, Artificial Sweeteners, and "Sugar-free" Products. This will help patient to have stable blood glucose profile and potentially avoid unintended weight gain.  - I encouraged her to switch to  unprocessed or minimally processed complex starch and increased protein intake (animal or plant source), fruits, and vegetables.  -  she is advised to stick to a routine mealtimes to eat 3 meals  a day and avoid unnecessary snacks ( to snack only to correct hypoglycemia).   - she will be scheduled with Jearld Fenton, RDN, CDE for individualized diabetes education.  - I have approached her with the following individualized plan to manage diabetes and patient agrees:   - She may need intensive treatment with basal/bolus insulin. -  However, I approached her to maximize her current basal insulin Levemir to 40 units daily at bedtime,  associated with strict monitoring of glucose 4 times a day-before meals and at bedtime. - She will be considered for prandial insulin if she returns with significantly above target postprandial hyperglycemia. - Patient is warned not to take insulin without proper monitoring per orders.  -Patient is encouraged to call clinic for blood glucose levels less than 70 or above 300 mg /dl. - I will continue metformin 1000 mg by mouth twice a day, therapeutically suitable for patient . - I will discontinue glimepiride, risk outweighs benefit for this patient.  - she advised to continue Victoza 1.2 mg subcutaneously daily. - Patient specific target  A1c;  LDL, HDL, Triglycerides, and  Waist Circumference were discussed in detail.  2) BP/HTN: Controlled. Continue current medications, hydrochlorothiazide 25 mg by mouth daily, carvedilol 25 mg by mouth twice a day. She said to be allergic to sulfa medications. 3) Lipids/HPL:   Control unknown, no recent lipid panel in her records.   Patient is advised to continue statins. 4)  Weight/Diet: CDE Consult will be initiated , exercise, and detailed carbohydrates information provided.  5) Chronic Care/Health Maintenance:  -she  is Statin medications and  is encouraged to continue to follow up with Ophthalmology, Dentist,  Podiatrist at least yearly or according to recommendations, and advised to  stay away from smoking. I have recommended yearly flu vaccine and  pneumonia vaccination at least every 5 years; moderate intensity exercise for up to 150 minutes weekly; and  sleep for at least 7 hours a day.  - I advised patient to maintain close follow up with Asencion Noble, MD for primary care needs.  - Time spent with the patient: 1 hour, of which >50% was spent in obtaining information about her symptoms, reviewing her previous labs, evaluations, and treatments, counseling her about her   currently uncontrolled complicated type 2 diabetes, hyperlipidemia, hypertension, and developing a plan for long term treatment; her  questions were answered to her satisfaction.  Follow up plan: - Return in about 1 week (around 03/27/2017) for follow up with meter and logs- no labs.  Glade Lloyd, MD [facility] [address] [place], [place] [address] Phone: [phone]  Fax: [phone]    03/20/2017, 4:41 PM  This note was partially dictated with voice recognition software. Similar sounding words can be transcribed inadequately or may not  be corrected upon review.

## 2017-03-20 NOTE — Patient Instructions (Signed)

## 2017-03-24 ENCOUNTER — Telehealth: Payer: Self-pay | Admitting: Allergy & Immunology

## 2017-03-24 NOTE — Telephone Encounter (Signed)
Patient is returning Kathy Tran's call from Friday.

## 2017-03-24 NOTE — Telephone Encounter (Signed)
Spoke to patient and went over her lab results and she understood

## 2017-03-25 ENCOUNTER — Ambulatory Visit (INDEPENDENT_AMBULATORY_CARE_PROVIDER_SITE_OTHER): Payer: 59

## 2017-03-25 DIAGNOSIS — J4550 Severe persistent asthma, uncomplicated: Secondary | ICD-10-CM

## 2017-03-25 DIAGNOSIS — N200 Calculus of kidney: Secondary | ICD-10-CM | POA: Diagnosis not present

## 2017-03-31 ENCOUNTER — Encounter: Payer: Self-pay | Admitting: "Endocrinology

## 2017-03-31 ENCOUNTER — Ambulatory Visit (INDEPENDENT_AMBULATORY_CARE_PROVIDER_SITE_OTHER): Payer: 59 | Admitting: "Endocrinology

## 2017-03-31 VITALS — BP 137/80 | HR 102 | Ht 61.5 in | Wt 154.0 lb

## 2017-03-31 DIAGNOSIS — E1159 Type 2 diabetes mellitus with other circulatory complications: Secondary | ICD-10-CM

## 2017-03-31 DIAGNOSIS — E782 Mixed hyperlipidemia: Secondary | ICD-10-CM

## 2017-03-31 DIAGNOSIS — I10 Essential (primary) hypertension: Secondary | ICD-10-CM

## 2017-03-31 MED ORDER — INSULIN ASPART 100 UNIT/ML FLEXPEN: 10.0000 [IU] | PEN_INJECTOR | Freq: Three times a day (TID) | SUBCUTANEOUS | 2 refills | Status: DC

## 2017-03-31 MED ORDER — INSULIN DETEMIR 100 UNIT/ML FLEXPEN: 40.0000 [IU] | PEN_INJECTOR | Freq: Every day | SUBCUTANEOUS | 2 refills | Status: DC

## 2017-03-31 NOTE — Progress Notes (Signed)
Consult Note       03/31/2017, 2:12 PM   Subjective:    Patient ID: Kathy Tran, female    DOB: 13-Jun-1955.  Kathy Tran is being seen in f/u for management of currently uncontrolled symptomatic diabetes requested by  Asencion Noble, MD.   Past Medical History:  Diagnosis Date  . Asthma    Flu march and May 2018  . Diabetes mellitus type 2, controlled ([place])    x5 yrs  . Eczema   . Heart murmur   . Hypertension    x 5 yrs  . Recurrent upper respiratory infection (URI)   . Renal disorder    renal stones   Past Surgical History:  Procedure Laterality Date  . [place]  . COLONOSCOPY N/A 03/27/2011   Performed by Rogene Houston, MD at [place]  . CYSTOSCOPY, URETEROSCOPY, WITH STONE BASKET EXTRACTION Left 11/08/2015   Performed by Raynelle Bring, MD at [facility]  . CYSTOSCOPY/RETROGRADE/URETEROSCOPY/STONE EXTRACTION WITH BASKET Right 07/03/2015   Performed by Irine Seal, MD at [facility]  . ENDOMETRIAL ABLATION    . FLEXIBLE BRONCHOSCOPY WITH PROPOFOL N/A 01/17/2017   Performed by Sinda Du, MD at [place]  . FLEXIBLE BRONCHOSCOPY WITH PROPOFOL Bilateral 10/31/2016   Performed by Sinda Du, MD at [place]  . KIDNEY STONE SURGERY     multiple  . SHOULDER SURGERY    . TONSILLECTOMY    . TUBAL LIGATION  1991   Social History   Socioeconomic History  . Marital status: Married    Spouse name: None  . Number of children: None  . Years of education: None  . Highest education level: None  Social Needs  . Financial resource strain: None  . Food insecurity - worry: None  . Food insecurity - inability: None  . Transportation needs - medical: None  . Transportation needs - non-medical: None  Occupational History  . None  Tobacco Use  . Smoking status: Never Smoker  . Smokeless tobacco: Never Used  Substance and Sexual Activity  . Alcohol use: Yes     Comment: occ  . Drug use: No  . Sexual activity: Yes    Birth control/protection: Surgical  Other Topics Concern  . None  Social History Narrative  . None   Outpatient Encounter Medications as of 03/31/2017  Medication Sig  . aspirin EC 81 MG tablet Take 1 tablet by mouth on Monday, Wednesday, and Friday  . carvedilol (COREG) 25 MG tablet Take 25 mg by mouth 2 (two) times daily with a meal.  . cetirizine (ZYRTEC) 10 MG tablet Take 10 mg by mouth at bedtime.  . fluconazole (DIFLUCAN) 100 MG tablet   . Fluticasone-Umeclidin-Vilant (TRELEGY ELLIPTA) 100-62.5-25 MCG/INH AEPB Inhale 1 puff into the lungs daily.  Marland Kitchen guaiFENesin (MUCINEX) 600 MG 12 hr tablet Take 1 tablet (600 mg total) by mouth 2 (two) times daily.  . hydrochlorothiazide (HYDRODIURIL) 25 MG tablet Take 25 mg by mouth daily.   . insulin aspart (NOVOLOG FLEXPEN) 100 UNIT/ML FlexPen Inject 10-16 Units 3 (three) times daily with meals into  the skin.  . Insulin Detemir (LEVEMIR FLEXTOUCH) 100 UNIT/ML Pen Inject 40 Units daily at 10 pm into the skin.  Marland Kitchen ipratropium-albuterol (DUONEB) 0.5-2.5 (3) MG/3ML SOLN Take 3 mLs by nebulization 3 (three) times daily.  . metFORMIN (GLUCOPHAGE-XR) 500 MG 24 hr tablet Take 1,000 mg by mouth 2 (two) times daily.  . montelukast (SINGULAIR) 10 MG tablet Take 1 tablet (10 mg total) by mouth at bedtime.  . pantoprazole (PROTONIX) 40 MG tablet Take 1 tablet (40 mg total) by mouth 2 (two) times daily before a meal.  . pravastatin (PRAVACHOL) 10 MG tablet Take 10 mg by mouth every evening.  . predniSONE (DELTASONE) 20 MG tablet 10 mg daily.  . VENTOLIN HFA 108 (90 Base) MCG/ACT inhaler Inhale 2 puffs into the lungs every 4 (four) hours.   Marland Kitchen VICTOZA 18 MG/3ML SOPN Inject 1.2 mg into the skin at bedtime.   . [DISCONTINUED] insulin detemir (LEVEMIR) 100 UNIT/ML injection Inject 0.4 mLs (40 Units total) at bedtime into the skin.   Facility-Administered Encounter Medications as of 03/31/2017  Medication  .  Mepolizumab SOLR 100 mg    ALLERGIES: Allergies  Allergen Reactions  . Sulfa Antibiotics Anaphylaxis    VACCINATION STATUS:  There is no immunization history on file for this patient.  Diabetes  She presents for her follow-up diabetic visit. She has type 2 diabetes mellitus. Onset time: She was diagnosed at approximate age of 78 years. Her disease course has been improving (Worsening due to concurrent therapy with prednisone 20 mg daily related to her COPD.). There are no hypoglycemic associated symptoms. Pertinent negatives for hypoglycemia include no confusion, headaches, pallor or seizures. Associated symptoms include fatigue, polydipsia and polyuria. Pertinent negatives for diabetes include no blurred vision, no chest pain and no polyphagia. There are no hypoglycemic complications. Symptoms are improving. There are no diabetic complications. Risk factors for coronary artery disease include diabetes mellitus, hypertension, sedentary lifestyle, tobacco exposure and dyslipidemia. Current diabetic treatment includes oral agent (monotherapy) and insulin injections. Her weight is stable. She is following a generally unhealthy diet. When asked about meal planning, she reported none. She has not had a previous visit with a dietitian. She never participates in exercise. Her home blood glucose trend is increasing rapidly. Her breakfast blood glucose range is generally 140-180 mg/dl. Her lunch blood glucose range is generally >200 mg/dl. Her dinner blood glucose range is generally >200 mg/dl. Her bedtime blood glucose range is generally >200 mg/dl. Her overall blood glucose range is >200 mg/dl. (She came with the blood glucose logs showing persistent hyperglycemia averaging above 200 mg/dL. Her most recent A1c was 11.9%.) An ACE inhibitor/angiotensin II receptor blocker is not being taken. She does not see a podiatrist.Eye exam is not current.  Hypertension  This is a chronic problem. The current episode  started more than 1 year ago. The problem is controlled. Pertinent negatives include no blurred vision, chest pain, headaches, palpitations or shortness of breath. Risk factors for coronary artery disease include diabetes mellitus, dyslipidemia, family history, sedentary lifestyle, smoking/tobacco exposure and post-menopausal state. Past treatments include diuretics.  Hyperlipidemia  This is a chronic problem. The current episode started more than 1 year ago. Exacerbating diseases include diabetes. Pertinent negatives include no chest pain, myalgias or shortness of breath. Current antihyperlipidemic treatment includes statins. Risk factors for coronary artery disease include diabetes mellitus, dyslipidemia, hypertension, post-menopausal, a sedentary lifestyle and family history.    Review of Systems  Constitutional: Positive for fatigue. Negative for chills, fever and  unexpected weight change.  HENT: Negative for trouble swallowing and voice change.   Eyes: Negative for blurred vision and visual disturbance.  Respiratory: Negative for cough, shortness of breath and wheezing.   Cardiovascular: Negative for chest pain, palpitations and leg swelling.  Gastrointestinal: Negative for diarrhea, nausea and vomiting.  Endocrine: Positive for polydipsia and polyuria. Negative for cold intolerance, heat intolerance and polyphagia.  Musculoskeletal: Negative for arthralgias and myalgias.  Skin: Negative for color change, pallor, rash and wound.  Neurological: Negative for seizures and headaches.  Psychiatric/Behavioral: Negative for confusion and suicidal ideas.    Objective:    BP 137/80   Pulse (!) 102   Ht 5' 1.5" (1.562 m)   Wt 154 lb (69.9 kg)   BMI 28.63 kg/m   Wt Readings from Last 3 Encounters:  03/31/17 154 lb (69.9 kg)  03/20/17 154 lb (69.9 kg)  01/20/17 149 lb (67.6 kg)     Physical Exam  Constitutional: She is oriented to person, place, and time. She appears well-developed.   HENT:  Head: Normocephalic and atraumatic.  Eyes: EOM are normal.  Neck: Normal range of motion. Neck supple. No tracheal deviation present. No thyromegaly present.  Cardiovascular: Normal rate and regular rhythm.  Pulmonary/Chest: Effort normal. She has wheezes. She has rales.  Abdominal: Soft. Bowel sounds are normal. There is no tenderness. There is no guarding.  Musculoskeletal: Normal range of motion. She exhibits no edema.  Neurological: She is alert and oriented to person, place, and time. She has normal reflexes. No cranial nerve deficit. Coordination normal.  Skin: Skin is warm and dry. No rash noted. No erythema. No pallor.  Psychiatric: She has a normal mood and affect. Judgment normal.   CMP ( most recent) CMP     Component Value Date/Time   NA 140 02/25/2017 1044   K 3.9 02/25/2017 1044   CL 99 02/25/2017 1044   CO2 25 02/25/2017 1044   GLUCOSE 261 (H) 02/25/2017 1044   GLUCOSE 456 (H) 12/27/2016 0846   BUN 15 02/25/2017 1044   CREATININE 0.76 02/25/2017 1044   CALCIUM 9.4 02/25/2017 1044   PROT 6.0 02/25/2017 1044   ALBUMIN 4.2 02/25/2017 1044   AST 15 02/25/2017 1044   ALT 24 02/25/2017 1044   ALKPHOS 41 02/25/2017 1044   BILITOT 0.5 02/25/2017 1044   GFRNONAA 87 02/25/2017 1044   GFRAA 100 02/25/2017 1044     Diabetic Labs (most recent): Lab Results  Component Value Date   HGBA1C 11.9 (H) 02/25/2017    Lab Results  Component Value Date   TSH 2.715 09/25/2016   TSH 0.823 07/18/2016      Assessment & Plan:   1. DM type 2 causing vascular disease ([place])  - Kathy Tran has currently uncontrolled symptomatic type 2 DM since  61 years of age. - She came with controlled fasting blood glucose profile however her postprandial blood glucose readings are significantly above target. Her recent A1c was 11.9%.  Recent labs reviewed.  -her diabetes is complicated by recent requirement for high-dose steroids related to her COPD, sedentary life, and BRIENNE LIGUORI remains at a high risk for more acute and chronic complications which include CAD, CVA, CKD, retinopathy, and neuropathy. These are all discussed in detail with the patient.  - I have counseled her on diet management and weight loss, by adopting a carbohydrate restricted/protein rich diet.  -  Suggestion is made for her to avoid simple carbohydrates  from her diet including  Cakes, Sweet Desserts / Pastries, Ice Cream, Soda (diet and regular), Sweet Tea, Candies, Chips, Cookies, Store Bought Juices, Alcohol in Excess of  1-2 drinks a day, Artificial Sweeteners, and "Sugar-free" Products. This will help patient to have stable blood glucose profile and potentially avoid unintended weight gain.   - I encouraged her to switch to  unprocessed or minimally processed complex starch and increased protein intake (animal or plant source), fruits, and vegetables.  - she is advised to stick to a routine mealtimes to eat 3 meals  a day and avoid unnecessary snacks ( to snack only to correct hypoglycemia).    - I have approached her with the following individualized plan to manage diabetes and patient agrees:   - She will need intensive treatment with basal/bolus insulin. -  I advised her to continue Levemir 40 units daily at bedtime, initiate NovoLog 10-16 units 3 times a day before meals associated with monitoring of glucose 4 times a day-before meals and at bedtime.  - Patient is warned not to take insulin without proper monitoring per orders.  -Patient is encouraged to call clinic for blood glucose levels less than 70 or above 200 mg /dl. - I will continue metformin 1000 mg by mouth twice a day, therapeutically suitable for patient .  - she advised to continue Victoza 1.2 mg subcutaneously daily. - Patient specific target  A1c;  LDL, HDL, Triglycerides, and  Waist Circumference were discussed in detail.  2) BP/HTN: Controlled. Continue current medications, hydrochlorothiazide 25 mg by mouth  daily, carvedilol 25 mg by mouth twice a day. She said to be allergic to sulfa medications. 3) Lipids/HPL:   Control unknown, no recent lipid panel in her records.   Patient is advised to continue statins. 4)  Weight/Diet: CDE Consult has been initiated , exercise, and detailed carbohydrates information provided.  5) Chronic Care/Health Maintenance:  -she  is Statin medications and  is encouraged to continue to follow up with Ophthalmology, Dentist,  Podiatrist at least yearly or according to recommendations, and advised to  stay away from smoking. I have recommended yearly flu vaccine and pneumonia vaccination at least every 5 years; moderate intensity exercise for up to 150 minutes weekly; and  sleep for at least 7 hours a day.  - I advised patient to maintain close follow up with Asencion Noble, MD for primary care needs.  - Time spent with the patient: 25 min, of which >50% was spent in reviewing her sugar logs , discussing her hypo- and hyper-glycemic episodes, reviewing her current and  previous labs and insulin doses and developing a plan to avoid hypo- and hyper-glycemia.    Follow up plan: - Return in about 4 weeks (around 04/28/2017) for follow up with meter and logs- no labs.  Glade Lloyd, MD [facility] [address] [place], [place] [address] Phone: [phone]  Fax: [phone]    03/31/2017, 2:12 PM  This note was partially dictated with voice recognition software. Similar sounding words can be transcribed inadequately or may not  be corrected upon review.

## 2017-03-31 NOTE — Patient Instructions (Signed)

## 2017-04-02 ENCOUNTER — Telehealth: Payer: Self-pay | Admitting: Allergy & Immunology

## 2017-04-02 MED ORDER — PNEUMOCOCCAL VAC POLYVALENT 25 MCG/0.5ML IJ INJ: 0.5000 mL | INJECTION | Freq: Once | INTRAMUSCULAR | 0 refills | Status: AC

## 2017-04-02 MED ORDER — HAEMOPHILUS B POLYSAC CONJ VAC IM SOLR: 0.5000 mL | Freq: Once | INTRAMUSCULAR | 0 refills | Status: AC

## 2017-04-02 NOTE — Telephone Encounter (Signed)
I have entered the prescriptions for the HIB and PneumoVax immunizations. Dr. Nelva Bush signed them and they have been sent to [facility] at [facility].

## 2017-04-02 NOTE — Telephone Encounter (Signed)
Pt called needs to a rx for HIB fax to the health dept. At [phone]. Pt number is [phone].

## 2017-04-07 DIAGNOSIS — J4550 Severe persistent asthma, uncomplicated: Secondary | ICD-10-CM | POA: Diagnosis not present

## 2017-04-08 DIAGNOSIS — I10 Essential (primary) hypertension: Secondary | ICD-10-CM | POA: Diagnosis not present

## 2017-04-08 DIAGNOSIS — D839 Common variable immunodeficiency, unspecified: Secondary | ICD-10-CM | POA: Diagnosis not present

## 2017-04-08 DIAGNOSIS — J4551 Severe persistent asthma with (acute) exacerbation: Secondary | ICD-10-CM | POA: Diagnosis not present

## 2017-04-09 DIAGNOSIS — D839 Common variable immunodeficiency, unspecified: Secondary | ICD-10-CM | POA: Diagnosis not present

## 2017-04-10 ENCOUNTER — Telehealth: Payer: Self-pay | Admitting: Allergy & Immunology

## 2017-04-10 NOTE — Telephone Encounter (Signed)
Spoke to patient advised paperwork is ready to be picked up. Will place in [place] box. Patient states she had immunoglobulin treatment yesterday and it made her sick fyi.

## 2017-04-10 NOTE — Telephone Encounter (Signed)
Forms filled out for [employer]ation at patient's workplace. I gave them to [facility], who will contact patient to let them know that we have them.   Salvatore Marvel, MD [facility] [place]

## 2017-04-16 DIAGNOSIS — D839 Common variable immunodeficiency, unspecified: Secondary | ICD-10-CM | POA: Diagnosis not present

## 2017-04-22 ENCOUNTER — Ambulatory Visit: Payer: Self-pay

## 2017-04-24 ENCOUNTER — Ambulatory Visit (INDEPENDENT_AMBULATORY_CARE_PROVIDER_SITE_OTHER): Payer: 59 | Admitting: *Deleted

## 2017-04-24 DIAGNOSIS — J4550 Severe persistent asthma, uncomplicated: Secondary | ICD-10-CM

## 2017-04-29 ENCOUNTER — Encounter: Payer: Self-pay | Admitting: "Endocrinology

## 2017-04-29 ENCOUNTER — Ambulatory Visit (INDEPENDENT_AMBULATORY_CARE_PROVIDER_SITE_OTHER): Payer: 59 | Admitting: Allergy & Immunology

## 2017-04-29 ENCOUNTER — Encounter: Payer: Self-pay | Admitting: Allergy & Immunology

## 2017-04-29 ENCOUNTER — Ambulatory Visit (INDEPENDENT_AMBULATORY_CARE_PROVIDER_SITE_OTHER): Payer: 59 | Admitting: "Endocrinology

## 2017-04-29 VITALS — BP 132/80 | HR 92 | Resp 18

## 2017-04-29 VITALS — BP 132/83 | HR 92 | Ht 62.0 in | Wt 157.0 lb

## 2017-04-29 DIAGNOSIS — J310 Chronic rhinitis: Secondary | ICD-10-CM | POA: Diagnosis not present

## 2017-04-29 DIAGNOSIS — B999 Unspecified infectious disease: Secondary | ICD-10-CM

## 2017-04-29 DIAGNOSIS — I10 Essential (primary) hypertension: Secondary | ICD-10-CM | POA: Diagnosis not present

## 2017-04-29 DIAGNOSIS — D801 Nonfamilial hypogammaglobulinemia: Secondary | ICD-10-CM | POA: Diagnosis not present

## 2017-04-29 DIAGNOSIS — J4550 Severe persistent asthma, uncomplicated: Secondary | ICD-10-CM

## 2017-04-29 DIAGNOSIS — J4551 Severe persistent asthma with (acute) exacerbation: Secondary | ICD-10-CM | POA: Insufficient documentation

## 2017-04-29 DIAGNOSIS — E782 Mixed hyperlipidemia: Secondary | ICD-10-CM | POA: Diagnosis not present

## 2017-04-29 DIAGNOSIS — E1159 Type 2 diabetes mellitus with other circulatory complications: Secondary | ICD-10-CM

## 2017-04-29 MED ORDER — FLUTICASONE FUROATE 200 MCG/ACT IN AEPB: 1.0000 | INHALATION_SPRAY | Freq: Every evening | RESPIRATORY_TRACT | 5 refills | Status: DC

## 2017-04-29 NOTE — Progress Notes (Addendum)
FOLLOW UP  Date of Service/Encounter:  04/28/17   Assessment:   Severe persistent asthma with COPD overlap  Non-allergic rhinitis  Recurrent infections with hypogammaglobulinemia - on Cuvitru (665m/kg/month)  Chronic steroid use x 7 months (Feb 2018 - December 2018) - now off prednisone  Multiple lung nodules in the right lung - needs repeat chest CT spring 2019  Insulin dependent diabetes - following with Dr. NDorris Fetch GERD - on pantoprazole   Asthma Reportables:  Severity: severe persistent  Risk: high Control: not well controlled    Plan/Recommendations:   Kathy Tran to be slowly improving from a respiratory perspective. There has been no magic bullet, as it were, but I believe cummulatively she is heading in the right direction. It is still very unclear what precipitated her decline in lung function earlier this year. In conjunction with this, she seems to have developed complication of chronic prednisone use, including insulin dependent diabetes (she did have diabetes mellitus at baseline) and hypogammaglobulinemia. I anticipate that her endogenous IgG production will rebound once the effects of the prednisone run its course.   1. 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 - I am optimistic that you are slowly improving.  - I am pleased that she is off of her prednisone, but it seems that she is having some side effects of the prednisone cessation (muscle aches, chills). - She does not have a fever and is actually feeling fine today, therefore I feel that influenza is unlikely. - Muscle aches and mood swings are fairly common with prednisone use.  - We will add on an inhaled steroid today as well since you are off of the prednisone: Arnuity 206m once daily (samples provided) - Daily controller medication(s): Arnuity 20048mone puff in the evening + Trelegy one puff once in the morning + Singulair 47m20mily +  Nucala monthly - Rescue medications: ProAir 4 puffs every 4-6 hours as needed or DuoNeb nebulizer one vial every 4-6 hours as needed - Asthma control goals:  * Full participation in all desired activities (may need albuterol before activity) * Albuterol use two time or less a week on average (not counting use with activity) * Cough interfering with sleep two time or less a month * Oral steroids no more than once a year * No hospitalizations  2. Chronic rhinitis - with negative blood environmental allergen testing - Continue with Benadryl 25mg43me daily.  - Kathy Tran does not seem to have many problems from a rhinitis standpoint. - We are planning to do skin testing once she is in a more stable place from a respiratory perspective.   3. Recurrent infections with hypogammaglobulinemia - on replacement immunoglobulin - Continue with Cuvitru every two weeks. - I still feel that the low IgG was secondary to her chronic steroid use. - Since she is off steroids, we can obtain some flow cytometry in the next month or so in order to specifically evaluate her B-cell markers.  - We will also get a steady state IgG level in one month to see if we need to make some adjustments to her dosing. - She is having some minor reactions, which could be attributed to the load that she is receiving.  - We will anticipate possibly coming off of your Cuvitru in the summer to see how she tolerates this and evaluate her endogenous IgG production.  - This will take around three months to fully see this since to  allow an adequate wash out period of her replacement immunoglobulin.  - Stopping her Cuvitru at this time will leave her defenses down for the cold and flu season.   4. Return in about 3 months (around 07/28/2017).  Subjective:   Kathy Tran is a 61 y.o. female presenting today for follow up of  Chief Complaint  Patient presents with  . Asthma    Kathy Tran has a history of the following: Patient  Active Problem List   Diagnosis Date Noted  . Hypogammaglobulinemia ([place]) 04/29/2017  . Recurrent infections 04/29/2017  . Non-allergic rhinitis 04/29/2017  . Severe persistent asthma without complication 61/22/4497  . Essential hypertension, benign 03/20/2017  . Asthma exacerbation 12/26/2016  . Bronchitis 09/25/2016  . Asthmatic bronchitis 07/18/2016  . DM type 2 causing vascular disease ([place]) 07/18/2016  . GERD (gastroesophageal reflux disease) 07/18/2016  . Mixed hyperlipidemia 07/18/2016  . History of colonic polyps 06/12/2016    History obtained from: chart review and patient.  Kathy Tran's Primary Care Provider is Asencion Noble, MD.     Kathy Tran is a 61 y.o. female presenting for a follow up visit. She was seen for her first appointment in September 2018. At that time, she had a prolonged history of shortness of breath and wheezing including a seven-month history of steroid dependence. She had undergone 2 bronchoscopies with unenlightening workups. Secondary to her steroids, she was developing type 2 diabetes as well as cushingoid facies. Her previous history of asthma was very vague, and really only included one episode of bronchitis. We obtained lab work to rule out serious causes of asthma including allergic bronchopulmonary aspergillosis, alpha-1 antitrypsin deficiency, and hypersensitivity pneumonitis; all of these were normal. 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 She did have a history of an elevated absolute eosinophil count of 300, therefore we started her on Nucala at that visit. Her smoking history was rather vague, and she reported smoking only socially. However she grew up with both parents smoking and her current husband smokes as well. Because of her terrible breathing status, we started her on Trelegy 1 puff once daily. I then saw her in October 2016 and she was doing ever so slightly better, although she remained on prednisone. She  did feel that the Nucala was providing some relief.   Since the last visit, she has remained stable. She feels that she is slowly getting better. Yesterday, she had some flu like symptoms. She did complete her steroids on December 13th. She has been slowly weaned since February. She has not been febrile, but she does endorse symptoms of body aches and chills. This is only since stopping the prednisone a few days ago.   She is now doing breathing treatments around 1-2 times per day (this is down from the 3-4 per day that she was doing). There were discussions about doing another bronchoscopies, but there is nothing scheduled at this point. She overall feels better than when I first met her, but she is certainly not back to normal. She is able to fulfill activities of daily living on certain days, but then other days she has problems with fatigue and SOB just from walking to the mailbox. She has submitted an application for disability (submitted the beginning of November 2018). She currently has healthcare coverage at least through March 2019 through her current workplace; unfortunately she has been unable to work and is on [employer]. She has been increasing her social life and  has been able to make it to some friends' gatherings as of late.   She has received two Cuvitru infusions thus far. She is getting help from a home nurse. She has had some mild infusion reactions consisting of generalized muscle aches and malaise, but she thinks overall that she is tolerating this well. Immunoglobin replacement is going well. She is getting routine visits at this time for the third visit. She is not very comfortable with adminsitering it herself at this point and is quite nervous about it. Otherwise, she has had no infectious problems at all and has required no antibiotics.   Dr. Dorris Fetch has officially weaned her from prednisone. He did not make any changes to her insulin or other medications, but Ms. Dormer is hopeful that  being off of the prednisone will help regulate her blood sugars. She does report some muscle aches intermittently that had worsened. However, there are some days that she feels perfectly fine without aches. She has remained afebrile and denies any worsening cough with the muscle aches.   Otherwise, there have been no changes to her past medical history, surgical history, family history, or social history.    Review of Systems: a 14-point review of systems is pertinent for what is mentioned in HPI.  Otherwise, all other systems were negative. Constitutional: negative other than that listed in the HPI Eyes: negative other than that listed in the HPI Ears, nose, mouth, throat, and face: negative other than that listed in the HPI Respiratory: negative other than that listed in the HPI Cardiovascular: negative other than that listed in the HPI Gastrointestinal: negative other than that listed in the HPI Genitourinary: negative other than that listed in the HPI Integument: negative other than that listed in the HPI Hematologic: negative other than that listed in the HPI Musculoskeletal: negative other than that listed in the HPI Neurological: negative other than that listed in the HPI Allergy/Immunologic: negative other than that listed in the HPI    Objective:   Blood pressure 132/80, pulse 92, resp. rate 18, SpO2 94 %. There is no height or weight on file to calculate BMI.   Physical Exam:  General: Alert, interactive, in no acute distress. Cushingoid facies.  Eyes: No conjunctival injection bilaterally, no discharge on the right, no discharge on the left and no Horner-Trantas dots present. PERRL bilaterally. EOMI without pain. No photophobia.  Ears: Right TM pearly gray with normal light reflex, Left TM pearly gray with normal light reflex, Right TM intact without perforation and Left TM intact without perforation.  Nose/Throat: External nose within normal limits and septum midline.  Turbinates edematous with clear discharge. Posterior oropharynx erythematous without cobblestoning in the posterior oropharynx. Tonsils 2+ without exudates.  Tongue without thrush. Adenopathy: no enlarged lymph nodes appreciated in the anterior cervical, occipital, axillary, epitrochlear, inguinal, or popliteal regions. Lungs: Decreased breath sounds with expiratory wheezing bilaterally. No increased work of breathing. CV: Normal S1/S2. No murmurs. Capillary refill <2 seconds.  Skin: Warm and dry, without lesions or rashes. Neuro:   Grossly intact. No focal deficits appreciated. Responsive to questions.  Diagnostic studies:   Spirometry: results abnormal (FEV1: 1.65/76%, FVC: 2.80/109%, FEV1/FVC: 58%).    Spirometry consistent with moderate obstructive disease. Overall, her values have actually improved compared to previous clinic visits.   Allergy Studies: none     Salvatore Marvel, MD [facility]

## 2017-04-29 NOTE — Patient Instructions (Signed)

## 2017-04-29 NOTE — Patient Instructions (Addendum)
1. 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 - I am optimistic that you are slowly improving.  - I am happy that you have weaned off of your steroids, but you may have some symptoms such as muscle aches during this after this period.  - We will add on an inhaled steroid today as well since you are off of the prednisone: Arnuity 2101mcg once daily - Daily controller medication(s): Arnuity 226mcg one puff in the evening + Trelegy one puff once in the morning + Singulair 10mg  daily + Nucala monthly - Rescue medications: ProAir 4 puffs every 4-6 hours as needed or DuoNeb nebulizer one vial every 4-6 hours as needed - Asthma control goals:  * Full participation in all desired activities (may need albuterol before activity) * Albuterol use two time or less a week on average (not counting use with activity) * Cough interfering with sleep two time or less a month * Oral steroids no more than once a year * No hospitalizations  2. Chronic rhinitis - with negative blood environmental allergen testing - Continue with Benadryl 25mg  once daily.   3. Hypogammaglobulinemia - Continue with Cuvitru every two weeks. - We will anticipate possibly coming off of your Cuvitru in the summer to see how you do. - I still feel that your low IgG levels were secondary to your prednisone that you have been on for so long.   4. Return in about 3 months (around 07/28/2017).   Please inform us of any Emergency Department visits, hospitalizations, or changes in symptoms. Call us before going to the ED for breathing or allergy symptoms since we might be able to fit you in for a sick visit. Feel free to contact us anytime with any questions, problems, or concerns.  It was a pleasure to see you again today! Enjoy the holiday season!  Websites that have reliable patient information: 1. American Academy of Asthma, Allergy, and Immunology: www.aaaai.org 2. Food Allergy  Research and Education (FARE): foodallergy.org 3. Mothers of Asthmatics: http://www.asthmacommunitynetwork.org 4. American College of Allergy, Asthma, and Immunology: www.acaai.org

## 2017-04-29 NOTE — Progress Notes (Signed)
Consult Note       04/29/2017, 9:41 AM   Subjective:    Patient ID: Kathy Tran, female    DOB: 01-Oct-1955.  Kathy Tran is being seen in f/u for management of currently uncontrolled symptomatic diabetes requested by  Asencion Noble, MD.   Past Medical History:  Diagnosis Date  . Asthma    Flu march and May 2018  . Diabetes mellitus type 2, controlled ([place])    x5 yrs  . Eczema   . Heart murmur   . Hypertension    x 5 yrs  . Recurrent upper respiratory infection (URI)   . Renal disorder    renal stones   Past Surgical History:  Procedure Laterality Date  . [place]  . COLONOSCOPY  03/27/2011   Procedure: COLONOSCOPY;  Surgeon: Rogene Houston, MD;  [facility];  Service: Endoscopy;  Laterality: N/A;  2:00  . CYSTOSCOPY W/ URETERAL STENT PLACEMENT Left 11/08/2015   Procedure: CYSTOSCOPY, URETEROSCOPY, WITH STONE BASKET EXTRACTION;  Surgeon: Raynelle Bring, MD;  Location: [facility];  Service: Urology;  Laterality: Left;  . CYSTOSCOPY/RETROGRADE/URETEROSCOPY/STONE EXTRACTION WITH BASKET Right 07/03/2015   Procedure: CYSTOSCOPY/RETROGRADE/URETEROSCOPY/STONE EXTRACTION WITH BASKET;  Surgeon: Irine Seal, MD;  Location: [facility];  Service: Urology;  Laterality: Right;  . ENDOMETRIAL ABLATION    . FLEXIBLE BRONCHOSCOPY Bilateral 10/31/2016   Procedure: FLEXIBLE BRONCHOSCOPY WITH PROPOFOL;  Surgeon: Sinda Du, MD;  [facility];  Service: Cardiopulmonary;  Laterality: Bilateral;  . FLEXIBLE BRONCHOSCOPY N/A 01/17/2017   Procedure: FLEXIBLE BRONCHOSCOPY WITH PROPOFOL;  Surgeon: Sinda Du, MD;  [facility];  Service: Cardiopulmonary;  Laterality: N/A;  . KIDNEY STONE SURGERY     multiple  . SHOULDER SURGERY    . TONSILLECTOMY    . TUBAL LIGATION  1991   Social History   Socioeconomic History  . Marital status: Married    Spouse name: None  .  Number of children: None  . Years of education: None  . Highest education level: None  Social Needs  . Financial resource strain: None  . Food insecurity - worry: None  . Food insecurity - inability: None  . Transportation needs - medical: None  . Transportation needs - non-medical: None  Occupational History  . None  Tobacco Use  . Smoking status: Never Smoker  . Smokeless tobacco: Never Used  Substance and Sexual Activity  . Alcohol use: Yes    Comment: occ  . Drug use: No  . Sexual activity: Yes    Birth control/protection: Surgical  Other Topics Concern  . None  Social History Narrative  . None   Outpatient Encounter Medications as of 04/29/2017  Medication Sig  . aspirin EC 81 MG tablet Take 1 tablet by mouth on Monday, Wednesday, and Friday  . carvedilol (COREG) 25 MG tablet Take 25 mg by mouth 2 (two) times daily with a meal.  . diphenhydrAMINE (BENADRYL) 25 MG tablet Take 25 mg by mouth at bedtime.  . fluconazole (DIFLUCAN) 100 MG tablet   . Fluticasone-Umeclidin-Vilant (TRELEGY ELLIPTA) 100-62.5-25 MCG/INH AEPB Inhale 1 puff into the  lungs daily.  Marland Kitchen guaiFENesin (MUCINEX) 600 MG 12 hr tablet Take 1 tablet (600 mg total) by mouth 2 (two) times daily.  . hydrochlorothiazide (HYDRODIURIL) 25 MG tablet Take 25 mg by mouth daily.   . insulin aspart (NOVOLOG FLEXPEN) 100 UNIT/ML FlexPen Inject 10-16 Units 3 (three) times daily with meals into the skin.  . Insulin Detemir (LEVEMIR FLEXTOUCH) 100 UNIT/ML Pen Inject 40 Units daily at 10 pm into the skin.  Marland Kitchen ipratropium-albuterol (DUONEB) 0.5-2.5 (3) MG/3ML SOLN Take 3 mLs by nebulization 3 (three) times daily.  . metFORMIN (GLUCOPHAGE-XR) 500 MG 24 hr tablet Take 1,000 mg by mouth 2 (two) times daily.  . montelukast (SINGULAIR) 10 MG tablet Take 1 tablet (10 mg total) by mouth at bedtime.  . pantoprazole (PROTONIX) 40 MG tablet Take 1 tablet (40 mg total) by mouth 2 (two) times daily before a meal.  . pravastatin (PRAVACHOL) 10  MG tablet Take 10 mg by mouth every evening.  . VENTOLIN HFA 108 (90 Base) MCG/ACT inhaler Inhale 2 puffs into the lungs every 4 (four) hours.   Marland Kitchen VICTOZA 18 MG/3ML SOPN Inject 1.2 mg into the skin at bedtime.   . cetirizine (ZYRTEC) 10 MG tablet Take 10 mg by mouth at bedtime.  . predniSONE (DELTASONE) 20 MG tablet 10 mg daily.   Facility-Administered Encounter Medications as of 04/29/2017  Medication  . Mepolizumab SOLR 100 mg    ALLERGIES: Allergies  Allergen Reactions  . Sulfa Antibiotics Anaphylaxis    VACCINATION STATUS:  There is no immunization history on file for this patient.  Diabetes  She presents for her follow-up diabetic visit. She has type 2 diabetes mellitus. Onset time: She was diagnosed at approximate age of 64 years. Her disease course has been improving (Worsening due to concurrent therapy with prednisone 20 mg daily related to her COPD.). There are no hypoglycemic associated symptoms. Pertinent negatives for hypoglycemia include no confusion, headaches, pallor or seizures. Associated symptoms include fatigue, polydipsia and polyuria. Pertinent negatives for diabetes include no blurred vision, no chest pain and no polyphagia. There are no hypoglycemic complications. Symptoms are improving. There are no diabetic complications. Risk factors for coronary artery disease include diabetes mellitus, hypertension, sedentary lifestyle, tobacco exposure and dyslipidemia. Current diabetic treatment includes oral agent (monotherapy) and insulin injections. Her weight is stable. She is following a generally unhealthy diet. When asked about meal planning, she reported none. She has not had a previous visit with a dietitian. She never participates in exercise. Her home blood glucose trend is increasing rapidly. Her breakfast blood glucose range is generally 140-180 mg/dl. Her lunch blood glucose range is generally 140-180 mg/dl. Her dinner blood glucose range is generally 140-180 mg/dl.  Her bedtime blood glucose range is generally 140-180 mg/dl. Her overall blood glucose range is 140-180 mg/dl. (She came with the blood glucose logs showing persistent hyperglycemia averaging above 200 mg/dL. Her most recent A1c was 11.9%.) An ACE inhibitor/angiotensin II receptor blocker is not being taken. She does not see a podiatrist.Eye exam is not current.  Hypertension  This is a chronic problem. The current episode started more than 1 year ago. The problem is controlled. Pertinent negatives include no blurred vision, chest pain, headaches, palpitations or shortness of breath. Risk factors for coronary artery disease include diabetes mellitus, dyslipidemia, family history, sedentary lifestyle, smoking/tobacco exposure and post-menopausal state. Past treatments include diuretics.  Hyperlipidemia  This is a chronic problem. The current episode started more than 1 year ago. Exacerbating diseases include diabetes.  Pertinent negatives include no chest pain, myalgias or shortness of breath. Current antihyperlipidemic treatment includes statins. Risk factors for coronary artery disease include diabetes mellitus, dyslipidemia, hypertension, post-menopausal, a sedentary lifestyle and family history.    Review of Systems  Constitutional: Positive for fatigue. Negative for chills, fever and unexpected weight change.  HENT: Negative for trouble swallowing and voice change.   Eyes: Negative for blurred vision and visual disturbance.  Respiratory: Negative for cough, shortness of breath and wheezing.   Cardiovascular: Negative for chest pain, palpitations and leg swelling.  Gastrointestinal: Negative for diarrhea, nausea and vomiting.  Endocrine: Positive for polydipsia and polyuria. Negative for cold intolerance, heat intolerance and polyphagia.  Musculoskeletal: Negative for arthralgias and myalgias.  Skin: Negative for color change, pallor, rash and wound.  Neurological: Negative for seizures and  headaches.  Psychiatric/Behavioral: Negative for confusion and suicidal ideas.    Objective:    BP 132/83   Pulse 92   Ht _0  (1.575 m)   Wt 157 lb (71.2 kg)   BMI 28.72 kg/m   Wt Readings from Last 3 Encounters:  04/29/17 157 lb (71.2 kg)  03/31/17 154 lb (69.9 kg)  03/20/17 154 lb (69.9 kg)     Physical Exam  Constitutional: She is oriented to person, place, and time. She appears well-developed.  HENT:  Head: Normocephalic and atraumatic.  Eyes: EOM are normal.  Neck: Normal range of motion. Neck supple. No tracheal deviation present. No thyromegaly present.  Cardiovascular: Normal rate and regular rhythm.  Pulmonary/Chest: Effort normal. She has wheezes. She has rales.  Abdominal: Soft. Bowel sounds are normal. There is no tenderness. There is no guarding.  Musculoskeletal: Normal range of motion. She exhibits no edema.  Neurological: She is alert and oriented to person, place, and time. She has normal reflexes. No cranial nerve deficit. Coordination normal.  Skin: Skin is warm and dry. No rash noted. No erythema. No pallor.  Psychiatric: She has a normal mood and affect. Judgment normal.   CMP ( most recent) CMP     Component Value Date/Time   NA 140 02/25/2017 1044   K 3.9 02/25/2017 1044   CL 99 02/25/2017 1044   CO2 25 02/25/2017 1044   GLUCOSE 261 (H) 02/25/2017 1044   GLUCOSE 456 (H) 12/27/2016 0846   BUN 15 02/25/2017 1044   CREATININE 0.76 02/25/2017 1044   CALCIUM 9.4 02/25/2017 1044   PROT 6.0 02/25/2017 1044   ALBUMIN 4.2 02/25/2017 1044   AST 15 02/25/2017 1044   ALT 24 02/25/2017 1044   ALKPHOS 41 02/25/2017 1044   BILITOT 0.5 02/25/2017 1044   GFRNONAA 87 02/25/2017 1044   GFRAA 100 02/25/2017 1044     Diabetic Labs (most recent): Lab Results  Component Value Date   HGBA1C 11.9 (H) 02/25/2017    Lab Results  Component Value Date   TSH 2.715 09/25/2016   TSH 0.823 07/18/2016      Assessment & Plan:   1. DM type 2 causing  vascular disease ([place])  - Kathy Tran has currently uncontrolled symptomatic type 2 DM since  61 years of age. - She came with controlled blood glucose profile, both fasting and postprandial to close to target.  - Her recent A1c was 11.9%.  Recent labs reviewed.  -her diabetes is complicated by recent requirement for high-dose steroids related to her COPD, sedentary life, and CECILLE MCCLUSKY remains at a high risk for more acute and chronic complications which include CAD, CVA, CKD,  retinopathy, and neuropathy. These are all discussed in detail with the patient.  - I have counseled her on diet management and weight loss, by adopting a carbohydrate restricted/protein rich diet.  -  Suggestion is made for her to avoid simple carbohydrates  from her diet including Cakes, Sweet Desserts / Pastries, Ice Cream, Soda (diet and regular), Sweet Tea, Candies, Chips, Cookies, Store Bought Juices, Alcohol in Excess of  1-2 drinks a day, Artificial Sweeteners, and "Sugar-free" Products. This will help patient to have stable blood glucose profile and potentially avoid unintended weight gain.   - I encouraged her to switch to  unprocessed or minimally processed complex starch and increased protein intake (animal or plant source), fruits, and vegetables.  - she is advised to stick to a routine mealtimes to eat 3 meals  a day and avoid unnecessary snacks ( to snack only to correct hypoglycemia).    - I have approached her with the following individualized plan to manage diabetes and patient agrees:   - She will continue to require intensive treatment with basal/bolus insulin. -  I advised her to continue Levemir 40 units daily at bedtime,  Humalog 10-16 units 3 times a day before meals associated with monitoring of glucose 4 times a day-before meals and at bedtime.  - Patient is warned not to take insulin without proper monitoring per orders.  -Patient is encouraged to call clinic for blood glucose levels  less than 70 or above 200 mg /dl. - I will continue metformin 1000 mg by mouth twice a day, therapeutically suitable for patient .  - she advised to continue Victoza 1.2 mg subcutaneously daily. - Patient specific target  A1c;  LDL, HDL, Triglycerides, and  Waist Circumference were discussed in detail.  2) BP/HTN: Controlled, I advised her to continue her current blood pressure medications  hydrochlorothiazide 25 mg by mouth daily, carvedilol 25 mg by mouth twice a day. She said to be allergic to sulfa medications. 3) Lipids/HPL:   Control unknown, no recent lipid panel in her records.   she will have fasting lipid panel along with her next blood work.   Patient is advised to continue statins. 4)  Weight/Diet: CDE Consult has been initiated , exercise, and detailed carbohydrates information provided.  5) Chronic Care/Health Maintenance:  -she  is Statin medications and  is encouraged to continue to follow up with Ophthalmology, Dentist,  Podiatrist at least yearly or according to recommendations, and advised to  stay away from smoking. I have recommended yearly flu vaccine and pneumonia vaccination at least every 5 years; moderate intensity exercise for up to 150 minutes weekly; and  sleep for at least 7 hours a day.  - I advised patient to maintain close follow up with Asencion Noble, MD for primary care needs.  - Time spent with the patient: 25 min, of which >50% was spent in reviewing her sugar logs , discussing her hypo- and hyper-glycemic episodes, reviewing her current and  previous labs and insulin doses and developing a plan to avoid hypo- and hyper-glycemia.   Follow up plan: - Return in about 7 weeks (around 06/17/2017) for follow up with pre-visit labs, meter, and logs.  Glade Lloyd, MD [facility] [address] [place], [place] [address] Phone: [phone]  Fax: [phone]    04/29/2017, 9:41 AM  This note was partially  dictated with voice recognition software. Similar sounding words can be transcribed inadequately or may not  be corrected upon  review.  

## 2017-04-30 ENCOUNTER — Encounter: Payer: Self-pay | Admitting: Allergy & Immunology

## 2017-04-30 DIAGNOSIS — D839 Common variable immunodeficiency, unspecified: Secondary | ICD-10-CM | POA: Diagnosis not present

## 2017-05-08 ENCOUNTER — Encounter: Payer: 59 | Attending: "Endocrinology | Admitting: Nutrition

## 2017-05-08 ENCOUNTER — Encounter: Payer: Self-pay | Admitting: Nutrition

## 2017-05-08 VITALS — Ht 62.0 in | Wt 158.0 lb

## 2017-05-08 DIAGNOSIS — J4550 Severe persistent asthma, uncomplicated: Secondary | ICD-10-CM | POA: Diagnosis not present

## 2017-05-08 DIAGNOSIS — E118 Type 2 diabetes mellitus with unspecified complications: Secondary | ICD-10-CM

## 2017-05-08 DIAGNOSIS — Z713 Dietary counseling and surveillance: Secondary | ICD-10-CM | POA: Insufficient documentation

## 2017-05-08 DIAGNOSIS — E1159 Type 2 diabetes mellitus with other circulatory complications: Secondary | ICD-10-CM | POA: Insufficient documentation

## 2017-05-08 DIAGNOSIS — IMO0002 Reserved for concepts with insufficient information to code with codable children: Secondary | ICD-10-CM

## 2017-05-08 DIAGNOSIS — J441 Chronic obstructive pulmonary disease with (acute) exacerbation: Secondary | ICD-10-CM

## 2017-05-08 DIAGNOSIS — E1165 Type 2 diabetes mellitus with hyperglycemia: Secondary | ICD-10-CM

## 2017-05-08 NOTE — Patient Instructions (Signed)
Goals 1.Follow My Palte2 2.ADD protein with brekfast 3. Cut out diet sodas 4.Add fruit with meals 5. Use baked potatoe or sweet potato instead of mashed potatoes 6. Increase water to 4-5 bottles of water per day Call Dr. Dorris Fetch if BS are below 70 or 300 2-3 times in a row.

## 2017-05-08 NOTE — Progress Notes (Signed)
  Medical Nutrition Therapy:  Appt start time: 1330 end time:  1430.   Assessment:  Primary concerns today: Diabetes Type 2.. Had it for about 10 yrs. Recently had COPD and been on prednisone which effects her BS. She lives with her husband. She does the cooking and shopping in the home. Most foods are baked and steamed. 40 units of Levemir, 10 units plus sliding scale of Humalog. Take inhalers and steroids at times. Bs are coming down now that she has been off steroids for about 2 weeks. Avg now is about 150's. Range 80-200's. [place] but since her COPD, hasn't been able to be as active.   She eats most foods they grow and meat they raise. Cooks mostly at home.  Wt gain is from prednisone. UBW 140's. She is motivated to improve her BS and reduce complications from her DM. Nutrition for COPD  Lab Results  Component Value Date   HGBA1C 11.9 (H) 02/25/2017  FBS"  Preferred Learning Style:   No preference indicated   Learning Readiness:   Ready  Change in progress   MEDICATIONS:   DIETARY INTAKE:  24-hr recall:  B ( AM): Toasted bacon sandwich, grape jelly , water, coffee and sf creamer Snk ( AM):   L ( PM): Roast beef, mashed potatoes, butter, corn 1/4 c, water and diet sodas. Snk ( PM):  D ( PM): Roast beef, carrots, potatoes, 1/2 roll, water Snk ( PM):  Beverages: water, diet sodas,coffee  Usual physical activity: ADL, walks on steps.  Estimated energy needs: 1500  calories 170 g carbohydrates 112 g protein 42 g fat  Progress Towards Goal(s):  In progress.   Nutritional Diagnosis:  NB-1.1 Food and nutrition-related knowledge deficit As related to Diabetes Type 2.  As evidenced by A1C 11.9%.    Intervention:  Nutrition and Diabetes education provided on My Plate, CHO counting, meal planning, portion sizes, timing of meals, avoiding snacks between meals unless having a low blood sugar, target ranges for A1C and blood sugars, signs/symptoms  and treatment of hyper/hypoglycemia, monitoring blood sugars, taking medications as prescribed, benefits of exercising 30 minutes per day and prevention of complications of DM. Marland Kitchen Goals 1.Follow My Plate Method 2.ADD protein with brekfast 3. Cut out diet sodas 4.Add fruit with meals 5. Use baked potatoe or sweet potato instead of mashed potatoes 6. Increase water to 4-5 bottles of water per day Call Dr. Dorris Fetch if BS are below 70 or 300 2-3 times in a row.   Teaching Method Utilized:  Visual Auditory Hands on  Handouts given during visit include:  The Plate MEthod   Meal Plan Card  Diabetes Instructions.     Barriers to learning/adherence to lifestyle change: none  Demonstrated degree of understanding via:  Teach Back   Monitoring/Evaluation:  Dietary intake, exercise, meal planning,SBG, and body weight in 1 month(s).

## 2017-05-12 DIAGNOSIS — D839 Common variable immunodeficiency, unspecified: Secondary | ICD-10-CM | POA: Diagnosis not present

## 2017-05-12 DIAGNOSIS — J4551 Severe persistent asthma with (acute) exacerbation: Secondary | ICD-10-CM | POA: Diagnosis not present

## 2017-05-12 DIAGNOSIS — I10 Essential (primary) hypertension: Secondary | ICD-10-CM | POA: Diagnosis not present

## 2017-05-15 ENCOUNTER — Telehealth: Payer: Self-pay | Admitting: Allergy & Immunology

## 2017-05-15 NOTE — Telephone Encounter (Signed)
Spoke with Kathy Tran. Kathy Tran states she is taking Prednisone, Augmentin and was given injections by Dr. Luan Pulling. Kathy Tran states she was unsure if she needed to have a follow up with Dr. Ernst Bowler sooner than 07/2017. Please advise if you would like for the patient to follow up sooner, thank you.

## 2017-05-15 NOTE — Telephone Encounter (Signed)
Patient went to see her pulmonologist, Dr. Luan Pulling, in [place], on Monday. He gave her 2 shots, oral prednisone, and an antibiotic. She wants to know if Dr. Ernst Bowler needs to see her, or if he got notes on that visit.

## 2017-05-15 NOTE — Telephone Encounter (Signed)
She is coming in on January 15th for her Nucala at 3pm. Why don't you overbook her for 3pm and I can see her quickly.   Salvatore Marvel, MD [facility] [place] [facility] [place]

## 2017-05-16 NOTE — Telephone Encounter (Signed)
Called and spoke with patient, she has agreed to be seen on January 15th at 3 pm

## 2017-05-17 NOTE — Telephone Encounter (Signed)
Noted. Thanks, Caryl Pina!   Salvatore Marvel, MD All[facility] and [place] [facility] [place]

## 2017-05-19 ENCOUNTER — Telehealth: Payer: Self-pay

## 2017-05-19 DIAGNOSIS — J449 Chronic obstructive pulmonary disease, unspecified: Secondary | ICD-10-CM

## 2017-05-19 NOTE — Telephone Encounter (Signed)
I called Ms. Kathy Tran to discuss her thoughts on her current therapies. She reports that she was in recent contact with a cold from one of her granddaughters over Christmas. She ended up contracting this illness and worsened. She went to see Dr. Luan Pulling last Monday and she was placed on a prednisone taper. She was also placed on Augmentin.   She is rather overwhelmed at this point. She reports that she is "taking so much medication and [she] is unsure what is working and what is not". We had discussed a referral to an academic center in the past, but she has declined. She is now ready to re-consider. Therefore, I will refer her to see Dr. Mahlon Gammon at [facility] in the Pulmonology Department. I will prepare a letter summarizing her clinical presentation and attempted treatment modalities and forward this letter to Dr. Floy Sabina when I complete it. In the meantime, we will start the referral process.   Office phone #: [phone] Office fax #: [phone]  Salvatore Marvel, MD [facility] and [place] [facility] [place]

## 2017-05-19 NOTE — Telephone Encounter (Signed)
Referral faxed to Dr Pam Drown office.Will follow back up to make sure patient gets scheduled.

## 2017-05-19 NOTE — Telephone Encounter (Signed)
Patient is calling due to her wanting to stop [place] [employer]. She continues to get sick and she is unsure what medication is really working for her. Patient stated she is ready to be referred to [facility].  She also wants to know how can she stop them from delivering this medication.    Please Advise

## 2017-05-20 DIAGNOSIS — J4550 Severe persistent asthma, uncomplicated: Secondary | ICD-10-CM | POA: Diagnosis not present

## 2017-05-20 NOTE — Telephone Encounter (Signed)
Summarizing letter written and forwarded to Dr. Floy Sabina.   Salvatore Marvel, MD [facility] and [place] [facility] [place]

## 2017-05-20 NOTE — Telephone Encounter (Signed)
S 

## 2017-05-27 ENCOUNTER — Encounter: Payer: Self-pay | Admitting: Allergy & Immunology

## 2017-05-27 ENCOUNTER — Ambulatory Visit (INDEPENDENT_AMBULATORY_CARE_PROVIDER_SITE_OTHER): Payer: 59 | Admitting: Allergy & Immunology

## 2017-05-27 ENCOUNTER — Ambulatory Visit (INDEPENDENT_AMBULATORY_CARE_PROVIDER_SITE_OTHER): Payer: 59

## 2017-05-27 VITALS — BP 150/80 | HR 80 | Resp 20

## 2017-05-27 DIAGNOSIS — J4550 Severe persistent asthma, uncomplicated: Secondary | ICD-10-CM

## 2017-05-27 DIAGNOSIS — D801 Nonfamilial hypogammaglobulinemia: Secondary | ICD-10-CM

## 2017-05-27 MED ORDER — AMOXICILLIN-POT CLAVULANATE 875-125 MG PO TABS: 1.0000 | ORAL_TABLET | Freq: Two times a day (BID) | ORAL | 0 refills | Status: AC

## 2017-05-27 NOTE — Progress Notes (Signed)
FOLLOW UP  Date of Service/Encounter:  05/27/17   Assessment:   Severe persistent asthma with COPD overlap  Non-allergic rhinitis  Recurrent infections with hypogammaglobulinemia - on Cuvitru (600mg /kg/month) (transitioning to IVIG)  Chronic steroid use x 7 months (Feb 2018 - December 2018) - now off prednisone  Multiple lung nodules in the right lung - needs repeat chest CT spring 2019   Insulin dependent diabetes - followed by Dr. Dorris Fetch  GERD - on pantoprazole   Asthma Reportables:  Severity: severe persistent  Risk: high Control: not well controlled   Plan/Recommendations:   Ms. Low appears to be slowly improving from a respiratory perspective. I believe cummulatively she is heading in the right direction. It is still very unclear what precipitated her decline in lung function in early 2018. In conjunction with this, she seems to have developed complication of chronic prednisone use, including insulin dependent diabetes (she did have diabetes mellitus at baseline) and hypogammaglobulinemia. I anticipate that her endogenous IgG production will rebound once the effects of the prednisone run its course. At this point, she remains stable and we are referring to [place] for a second opinion.   1. 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  - I am optimistic that you are slowly improving, but I am looking forward to you seeing Dr. Floy Sabina.  - Daily controller medication(s): Arnuity 270mcg one puff in the evening + Trelegy one puff once in the morning + Singulair 10mg  daily + Nucala monthly - Rescue medications: ProAir 4 puffs every 4-6 hours as needed or DuoNeb nebulizer one vial every 4-6 hours as needed - Asthma control goals:  * Full participation in all desired activities (may need albuterol before activity) * Albuterol use two time or less a week on average (not counting  use with activity) * Cough interfering with sleep two time or less a month * Oral steroids no more than once a year * No hospitalizations  2. Chronic rhinitis - with negative blood environmental allergen testing - Continue with Benadryl 25mg  once daily.  - This seems to control her symptoms well.   3. Sinusitis - partially treated - We will send in a prescription for Augmentin twice daily for one more week since she seems to have developed some rebound sinusitis symptoms. - I think we can hold off on prednisone for now.   4. Hypogammaglobulinemia - We are switching you to IVIG since she is having marked abdominal pain from the infusions.  - We will anticipate possibly coming off of her IgG replacement in the summer since this is a more low risk time of the year. - Stopping the middle of the cold and flu season could be disastrous.  - We will get an IgG level today to see where her IgG level is at this point.  - We will also get some labs to look at your B cell markers. - I continue to think that her chronic prednisone use lead to the hypogammaglobulinemia.   5. Return in about 2 months (around 07/29/2017).  Subjective:   NEL STONEKING is a 62 y.o. female presenting today for follow up of  Chief Complaint  Patient presents with  . Fatigue  . Otalgia    [name] has a history of the following: Patient Active Problem List   Diagnosis Date Noted  . Hypogammaglobulinemia ([place]) 04/29/2017  . Recurrent infections 04/29/2017  . Non-allergic rhinitis  04/29/2017  . Severe persistent asthma without complication 53/66/4403  . Essential hypertension, benign 03/20/2017  . Asthma exacerbation 12/26/2016  . Bronchitis 09/25/2016  . Asthmatic bronchitis 07/18/2016  . DM type 2 causing vascular disease ([place]) 07/18/2016  . GERD (gastroesophageal reflux disease) 07/18/2016  . Mixed hyperlipidemia 07/18/2016  . History of colonic polyps 06/12/2016    History obtained from: chart review  and patient.  Jorge Mandril Adamczak's Primary Care Provider is Asencion Noble, MD.     Gracen is a 62 y.o. female presenting for a follow up visit. She was seen for her first appointment in September 2018. At that time, she had a prolonged history of shortness of breath and wheezing including a seven-month history of steroid dependence. She had undergone 2 bronchoscopies with unenlightening workups. Secondary to her steroids, she was developing type 2 diabetes as well as cushingoid facies. Her previous history of asthma was very vague, and really only included one episode of bronchitis. We obtained lab work to rule out serious causes of asthma including allergic bronchopulmonary aspergillosis, alpha-1 antitrypsin deficiency, and hypersensitivity pneumonitis; all of these were normal. 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 She did have a history of an elevated absolute eosinophil count of 300, therefore we started her on Nucala at that visit. Her smoking history was rather vague, and she reported smoking only socially. However she grew up with both parents smoking and her current husband smokes as well. Because of her terrible breathing status, we started her onTrelegy1 puff once daily in September 2018. At the last visit in December 2018, her lung function was actually somewhat improved. She had recently completed her prednisone wean. Diabetes was better controlled under the management of Dr. Dorris Fetch.   Since the last visit, she has become rather dejected. Her symptoms took a turn for the worse at the end of December. At that time, she developed nasal congestion, shortness of breath, and actually needed to be put on prednisone once again. She was also started on Augmentin for one week, which did seem to improve her symptoms. Evidently, she has two step grand children living with her, therefore she has been exposed to more germs than normal. She remains on the Trelegy as well as  Singulair. She has also been very compliant with her Nucala. She and I had a long conversation around one week ago at which time she told me that she was finally open to obtaining a second opinion at [facility]. We have referred her to see Dr. Mahlon Gammon and I have compiled a long letter to introduce her clinical story to Dr. Floy Sabina.Today is a good day for her, and she denies wheezing at all today. However, her symptoms could change within hours.   She remains on IgG replacement, but is in the midst of transitioning to IVIG. She has felt that her abdomen becomes rather taut with the SCIG and it seems to hurt for a long period of time before abating. She is also unsure whether she would be able to give the infusions to herself. She is already giving herself insulin, therefore her abdomen is rather pricked already. We are in the process of switching over to IVIG.   Otherwise, there have been no changes to her past medical history, surgical history, family history, or social history. She is applying for disability. She was getting fairly close, but with the government shut down this process has ground to a halt.     Review of  Systems: a 14-point review of systems is pertinent for what is mentioned in HPI.  Otherwise, all other systems were negative. Constitutional: negative other than that listed in the HPI Eyes: negative other than that listed in the HPI Ears, nose, mouth, throat, and face: negative other than that listed in the HPI Respiratory: negative other than that listed in the HPI Cardiovascular: negative other than that listed in the HPI Gastrointestinal: negative other than that listed in the HPI Genitourinary: negative other than that listed in the HPI Integument: negative other than that listed in the HPI Hematologic: negative other than that listed in the HPI Musculoskeletal: negative other than that listed in the HPI Neurological: negative other than that listed in the  HPI Allergy/Immunologic: negative other than that listed in the HPI    Objective:   Blood pressure (!) 150/80, pulse 80, resp. rate 20. There is no height or weight on file to calculate BMI.   Physical Exam:  General: Alert, interactive, in no acute distress. Cushingoid facies.  Eyes: No conjunctival injection bilaterally, no discharge on the right, no discharge on the left and no Horner-Trantas dots present. PERRL bilaterally. EOMI without pain. No photophobia.  Ears: Right TM pearly gray with normal light reflex, Left TM pearly gray with normal light reflex, Right TM intact without perforation and Left TM intact without perforation.  Nose/Throat: External nose within normal limits and septum midline. Turbinates edematous with clear discharge. Posterior oropharynx mildly erythematous without cobblestoning in the posterior oropharynx. Tonsils 2+ without exudates.  Tongue without thrush.Right sided maxillary tenderness.  Adenopathy: no enlarged lymph nodes appreciated in the anterior cervical, occipital, axillary, epitrochlear, inguinal, or popliteal regions. Lungs: Clear to auscultation without wheezing, rhonchi or rales. No increased work of breathing. She is actually moving air well even in the bases.  CV: Normal S1/S2. No murmurs. Capillary refill <2 seconds.  Skin: Warm and dry, without lesions or rashes. Neuro:   Grossly intact. No focal deficits appreciated. Responsive to questions.  Diagnostic studies:   Spirometry: results abnormal (FEV1: 1.93/93%, FVC: 3.03/122%, FEV1/FVC: 63%) .    Spirometry consistent with mild obstructive disease. Overall values are improved over the last two clinic visits. Today her physical exam is markedly improved compared to previous visits.    Allergy Studies: none     Salvatore Marvel, MD [facility]

## 2017-05-27 NOTE — Patient Instructions (Addendum)
1. 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  - I am optimistic that you are slowly improving, but I am looking forward to you seeing Dr. Floy Sabina.  - Daily controller medication(s): Arnuity 227mcg one puff in the evening + Trelegy one puff once in the morning + Singulair 10mg  daily + Nucala monthly - Rescue medications: ProAir 4 puffs every 4-6 hours as needed or DuoNeb nebulizer one vial every 4-6 hours as needed - Asthma control goals:  * Full participation in all desired activities (may need albuterol before activity) * Albuterol use two time or less a week on average (not counting use with activity) * Cough interfering with sleep two time or less a month * Oral steroids no more than once a year * No hospitalizations  2. Chronic rhinitis - with negative blood environmental allergen testing - Continue with Benadryl 25mg  once daily.   3. Sinusitis - partially treated - We will send in a prescription for Augmentin twice daily for one more week. - I think we can remain off of prednisone.   4. Hypogammaglobulinemia - We are switching you to IVIG. - We will anticipate possibly coming off of your IgG replacement in the summer to see how you do. - We will get an IgG level today to see where they are at this point. - We will also get some labs to look at your B cell markers.  5. Return in about 2 months (around 07/29/2017).   Please inform us of any Emergency Department visits, hospitalizations, or changes in symptoms. Call us before going to the ED for breathing or allergy symptoms since we might be able to fit you in for a sick visit. Feel free to contact us anytime with any questions, problems, or concerns.  It was a pleasure to see you again today! Happy New Year!   Websites that have reliable patient information: 1. American Academy of Asthma, Allergy, and Immunology: www.aaaai.org 2. Food Allergy  Research and Education (FARE): foodallergy.org 3. Mothers of Asthmatics: http://www.asthmacommunitynetwork.org 4. American College of Allergy, Asthma, and Immunology: www.acaai.org

## 2017-05-30 DIAGNOSIS — D839 Common variable immunodeficiency, unspecified: Secondary | ICD-10-CM | POA: Diagnosis not present

## 2017-05-30 NOTE — Addendum Note (Signed)
Addended by: Herbie Drape on: 05/30/2017 08:14 AM   Modules accepted: Orders

## 2017-06-02 DIAGNOSIS — D801 Nonfamilial hypogammaglobulinemia: Secondary | ICD-10-CM | POA: Diagnosis not present

## 2017-06-02 DIAGNOSIS — E119 Type 2 diabetes mellitus without complications: Secondary | ICD-10-CM | POA: Diagnosis not present

## 2017-06-03 DIAGNOSIS — D839 Common variable immunodeficiency, unspecified: Secondary | ICD-10-CM | POA: Diagnosis not present

## 2017-06-05 DIAGNOSIS — K219 Gastro-esophageal reflux disease without esophagitis: Secondary | ICD-10-CM | POA: Diagnosis not present

## 2017-06-05 DIAGNOSIS — E119 Type 2 diabetes mellitus without complications: Secondary | ICD-10-CM | POA: Diagnosis not present

## 2017-06-05 DIAGNOSIS — J45909 Unspecified asthma, uncomplicated: Secondary | ICD-10-CM | POA: Diagnosis not present

## 2017-06-05 LAB — B-CELL MEMORY AND NAIVE PANEL
B-CELLS % CD19: 6 % (ref 5–26)
B-CELLS ABSOLUTE CD19: 81 {cells}/uL (ref 58–558)
CLASS-SWITCHED ABS: 16 {cells}/uL (ref 4–62)
Class-switched Memory %: 20 % (ref 3–23)
IGM ONLY MEMORY %: 1.9 % (ref 0.3–6.0)
IgM Only Memory Abs: 1.5 cells/uL (ref 0.6–16.4)
Naive B-cell %CD19+/CD27-/IgD+: 63 % (ref 29–93)
Naive BCL Abs CD19+/CD27-/IgD+: 51 cells/uL (ref 22–423)
Non-switch Abs: 7 cells/uL (ref 4–66)
Non-switched Memory %: 9 % (ref 2–25)
TOT MEM BCL ABSOL CD19+/CD27+: 25 {cells}/uL (ref 13–148)
TOTAL MEMORY B-CELL%CD19/CD27+: 31 % (ref 7–48)

## 2017-06-05 LAB — IGG: IGG (IMMUNOGLOBIN G), SERUM: 722 mg/dL (ref 700–1600)

## 2017-06-10 DIAGNOSIS — J45909 Unspecified asthma, uncomplicated: Secondary | ICD-10-CM | POA: Diagnosis not present

## 2017-06-10 DIAGNOSIS — I10 Essential (primary) hypertension: Secondary | ICD-10-CM | POA: Diagnosis not present

## 2017-06-10 DIAGNOSIS — B370 Candidal stomatitis: Secondary | ICD-10-CM | POA: Diagnosis not present

## 2017-06-10 DIAGNOSIS — J4550 Severe persistent asthma, uncomplicated: Secondary | ICD-10-CM | POA: Diagnosis not present

## 2017-06-11 ENCOUNTER — Other Ambulatory Visit: Payer: Self-pay | Admitting: "Endocrinology

## 2017-06-11 DIAGNOSIS — E782 Mixed hyperlipidemia: Secondary | ICD-10-CM | POA: Diagnosis not present

## 2017-06-11 DIAGNOSIS — E1159 Type 2 diabetes mellitus with other circulatory complications: Secondary | ICD-10-CM | POA: Diagnosis not present

## 2017-06-12 LAB — COMPREHENSIVE METABOLIC PANEL
ALK PHOS: 48 IU/L (ref 39–117)
ALT: 20 IU/L (ref 0–32)
AST: 19 IU/L (ref 0–40)
Albumin/Globulin Ratio: 1.4 (ref 1.2–2.2)
Albumin: 4.3 g/dL (ref 3.5–5.5)
BUN / CREAT RATIO: 23 (ref 9–23)
BUN: 16 mg/dL (ref 6–24)
Bilirubin Total: 0.4 mg/dL (ref 0.0–1.2)
CALCIUM: 10 mg/dL (ref 8.7–10.2)
CHLORIDE: 99 mmol/L (ref 96–106)
CO2: 27 mmol/L (ref 20–29)
Creatinine, Ser: 0.71 mg/dL (ref 0.57–1.00)
GFR, EST AFRICAN AMERICAN: 108 mL/min/{1.73_m2} (ref >59)
GFR, EST NON AFRICAN AMERICAN: 94 mL/min/{1.73_m2} (ref >59)
GLOBULIN, TOTAL: 3 g/dL (ref 1.5–4.5)
Glucose: 91 mg/dL (ref 65–99)
Potassium: 4.2 mmol/L (ref 3.5–5.2)
Sodium: 142 mmol/L (ref 134–144)
Total Protein: 7.3 g/dL (ref 6.0–8.5)

## 2017-06-12 LAB — LIPID PANEL W/O CHOL/HDL RATIO
Cholesterol, Total: 187 mg/dL (ref 100–199)
HDL: 73 mg/dL (ref >39)
LDL Calculated: 99 mg/dL (ref 0–99)
Triglycerides: 75 mg/dL (ref 0–149)
VLDL CHOLESTEROL CAL: 15 mg/dL (ref 5–40)

## 2017-06-12 LAB — MICROALBUMIN, URINE: MICROALBUM., U, RANDOM: 8.4 ug/mL

## 2017-06-12 LAB — T4, FREE: FREE T4: 1.53 ng/dL (ref 0.82–1.77)

## 2017-06-12 LAB — VITAMIN D 25 HYDROXY (VIT D DEFICIENCY, FRACTURES): VIT D 25 HYDROXY: 26.4 ng/mL — AB (ref 30.0–100.0)

## 2017-06-12 LAB — SPECIMEN STATUS REPORT

## 2017-06-12 LAB — TSH: TSH: 3.31 u[IU]/mL (ref 0.450–4.500)

## 2017-06-17 ENCOUNTER — Ambulatory Visit (INDEPENDENT_AMBULATORY_CARE_PROVIDER_SITE_OTHER): Payer: 59 | Admitting: "Endocrinology

## 2017-06-17 ENCOUNTER — Encounter: Payer: Self-pay | Admitting: Nutrition

## 2017-06-17 ENCOUNTER — Encounter: Payer: Self-pay | Admitting: "Endocrinology

## 2017-06-17 ENCOUNTER — Encounter: Payer: 59 | Attending: Internal Medicine | Admitting: Nutrition

## 2017-06-17 VITALS — Ht 62.0 in | Wt 151.0 lb

## 2017-06-17 VITALS — BP 137/79 | HR 90 | Ht 62.0 in | Wt 151.0 lb

## 2017-06-17 DIAGNOSIS — E118 Type 2 diabetes mellitus with unspecified complications: Secondary | ICD-10-CM

## 2017-06-17 DIAGNOSIS — E1165 Type 2 diabetes mellitus with hyperglycemia: Secondary | ICD-10-CM

## 2017-06-17 DIAGNOSIS — E782 Mixed hyperlipidemia: Secondary | ICD-10-CM

## 2017-06-17 DIAGNOSIS — I10 Essential (primary) hypertension: Secondary | ICD-10-CM | POA: Diagnosis not present

## 2017-06-17 DIAGNOSIS — E1159 Type 2 diabetes mellitus with other circulatory complications: Secondary | ICD-10-CM | POA: Insufficient documentation

## 2017-06-17 DIAGNOSIS — Z713 Dietary counseling and surveillance: Secondary | ICD-10-CM | POA: Insufficient documentation

## 2017-06-17 DIAGNOSIS — IMO0002 Reserved for concepts with insufficient information to code with codable children: Secondary | ICD-10-CM

## 2017-06-17 LAB — HEMOGLOBIN A1C: HEMOGLOBIN A1C: 8.3

## 2017-06-17 NOTE — Progress Notes (Signed)
Consult Note       06/17/2017, 2:21 PM   Subjective:    Patient ID: Kathy Tran, female    DOB: [birth year].  Kathy Tran is being seen in f/u for management of currently uncontrolled symptomatic diabetes requested by  Asencion Noble, MD.   Past Medical History:  Diagnosis Date  . Asthma    Flu march and May 2018  . Diabetes mellitus type 2, controlled ([place])    x5 yrs  . Eczema   . Heart murmur   . Hypertension    x 5 yrs  . Recurrent upper respiratory infection (URI)   . Renal disorder    renal stones   Past Surgical History:  Procedure Laterality Date  . [place]  . COLONOSCOPY  03/27/2011   Procedure: COLONOSCOPY;  Surgeon: Rogene Houston, MD;  Location: [facility];  Service: Endoscopy;  Laterality: N/A;  2:00  . CYSTOSCOPY W/ URETERAL STENT PLACEMENT Left 11/08/2015   Procedure: CYSTOSCOPY, URETEROSCOPY, WITH STONE BASKET EXTRACTION;  Surgeon: Raynelle Bring, MD;  Location: [facility];  Service: Urology;  Laterality: Left;  . CYSTOSCOPY/RETROGRADE/URETEROSCOPY/STONE EXTRACTION WITH BASKET Right 07/03/2015   Procedure: CYSTOSCOPY/RETROGRADE/URETEROSCOPY/STONE EXTRACTION WITH BASKET;  Surgeon: Irine Seal, MD;  Location: [facility];  Service: Urology;  Laterality: Right;  . ENDOMETRIAL ABLATION    . FLEXIBLE BRONCHOSCOPY Bilateral 10/31/2016   Procedure: FLEXIBLE BRONCHOSCOPY WITH PROPOFOL;  Surgeon: Sinda Du, MD;  Location: [facility];  Service: Cardiopulmonary;  Laterality: Bilateral;  . FLEXIBLE BRONCHOSCOPY N/A 01/17/2017   Procedure: FLEXIBLE BRONCHOSCOPY WITH PROPOFOL;  Surgeon: Sinda Du, MD;  Location: [facility];  Service: Cardiopulmonary;  Laterality: N/A;  . KIDNEY STONE SURGERY     multiple  . SHOULDER SURGERY    . TONSILLECTOMY    . TUBAL LIGATION  1991   Social History   Socioeconomic History  . Marital status: Married    Spouse name: None  . Number  of children: None  . Years of education: None  . Highest education level: None  Social Needs  . Financial resource strain: None  . Food insecurity - worry: None  . Food insecurity - inability: None  . Transportation needs - medical: None  . Transportation needs - non-medical: None  Occupational History  . None  Tobacco Use  . Smoking status: Never Smoker  . Smokeless tobacco: Never Used  Substance and Sexual Activity  . Alcohol use: Yes    Comment: occ  . Drug use: No  . Sexual activity: Yes    Birth control/protection: Surgical  Other Topics Concern  . None  Social History Narrative  . None   Outpatient Encounter Medications as of 06/17/2017  Medication Sig  . aspirin EC 81 MG tablet Take 1 tablet by mouth on Monday, Wednesday, and Friday  . carvedilol (COREG) 25 MG tablet Take 25 mg by mouth 2 (two) times daily with a meal.  . diphenhydrAMINE (BENADRYL) 25 MG tablet Take 25 mg by mouth at bedtime.  . fluconazole (DIFLUCAN) 100 MG tablet   . Fluticasone-Umeclidin-Vilant (TRELEGY ELLIPTA) 100-62.5-25 MCG/INH AEPB Inhale 1 puff into the  lungs daily.  Marland Kitchen guaiFENesin (MUCINEX) 600 MG 12 hr tablet Take 1 tablet (600 mg total) by mouth 2 (two) times daily.  . hydrochlorothiazide (HYDRODIURIL) 25 MG tablet Take 25 mg by mouth daily.   . Insulin Detemir (LEVEMIR FLEXTOUCH) 100 UNIT/ML Pen Inject 40 Units daily at 10 pm into the skin.  Marland Kitchen ipratropium-albuterol (DUONEB) 0.5-2.5 (3) MG/3ML SOLN Take 3 mLs by nebulization 3 (three) times daily.  Marland Kitchen LORazepam (ATIVAN) 0.5 MG tablet   . metFORMIN (GLUCOPHAGE-XR) 500 MG 24 hr tablet Take 1,000 mg by mouth 2 (two) times daily.  . montelukast (SINGULAIR) 10 MG tablet Take 1 tablet (10 mg total) by mouth at bedtime.  . pantoprazole (PROTONIX) 40 MG tablet Take 1 tablet (40 mg total) by mouth 2 (two) times daily before a meal.  . pravastatin (PRAVACHOL) 10 MG tablet Take 10 mg by mouth every evening.  . VENTOLIN HFA 108 (90 Base) MCG/ACT inhaler  Inhale 2 puffs into the lungs every 4 (four) hours.   Marland Kitchen VICTOZA 18 MG/3ML SOPN Inject 1.2 mg into the skin at bedtime.   . [DISCONTINUED] Fluticasone Furoate (ARNUITY ELLIPTA) 200 MCG/ACT AEPB Inhale 1 Inhaler into the lungs every evening.  . [DISCONTINUED] glimepiride (AMARYL) 2 MG tablet   . [DISCONTINUED] HUMALOG KWIKPEN 100 UNIT/ML KiwkPen   . [DISCONTINUED] insulin aspart (NOVOLOG FLEXPEN) 100 UNIT/ML FlexPen Inject 10-16 Units 3 (three) times daily with meals into the skin.  . [DISCONTINUED] nitrofurantoin, macrocrystal-monohydrate, (MACROBID) 100 MG capsule   . [DISCONTINUED] predniSONE (DELTASONE) 20 MG tablet 10 mg daily.   Facility-Administered Encounter Medications as of 06/17/2017  Medication  . Mepolizumab SOLR 100 mg    ALLERGIES: Allergies  Allergen Reactions  . Sulfa Antibiotics Anaphylaxis    VACCINATION STATUS:  There is no immunization history on file for this patient.  Diabetes  She presents for her follow-up diabetic visit. She has type 2 diabetes mellitus. Onset time: She was diagnosed at approximate age of 6 years. Her disease course has been improving (Worsening due to concurrent therapy with prednisone 20 mg daily related to her COPD.). There are no hypoglycemic associated symptoms. Pertinent negatives for hypoglycemia include no confusion, headaches, pallor or seizures. Associated symptoms include fatigue, polydipsia and polyuria. Pertinent negatives for diabetes include no blurred vision, no chest pain and no polyphagia. There are no hypoglycemic complications. Symptoms are improving. There are no diabetic complications. Risk factors for coronary artery disease include diabetes mellitus, hypertension, sedentary lifestyle, tobacco exposure and dyslipidemia. Current diabetic treatment includes oral agent (monotherapy) and insulin injections. Her weight is decreasing steadily. She is following a generally unhealthy diet. When asked about meal planning, she reported  none. She has not had a previous visit with a dietitian. She never participates in exercise. Her home blood glucose trend is increasing rapidly. Her breakfast blood glucose range is generally 110-130 mg/dl. Her lunch blood glucose range is generally 110-130 mg/dl. Her bedtime blood glucose is taken between 10-11 pm. Her bedtime blood glucose range is generally 130-140 mg/dl. Her overall blood glucose range is 130-140 mg/dl. An ACE inhibitor/angiotensin II receptor blocker is not being taken. She does not see a podiatrist.Eye exam is not current.  Hypertension  This is a chronic problem. The current episode started more than 1 year ago. The problem is controlled. Pertinent negatives include no blurred vision, chest pain, headaches, palpitations or shortness of breath. Risk factors for coronary artery disease include diabetes mellitus, dyslipidemia, family history, sedentary lifestyle, smoking/tobacco exposure and post-menopausal state. Past treatments  include diuretics.  Hyperlipidemia  This is a chronic problem. The current episode started more than 1 year ago. Exacerbating diseases include diabetes. Pertinent negatives include no chest pain, myalgias or shortness of breath. Current antihyperlipidemic treatment includes statins. Risk factors for coronary artery disease include diabetes mellitus, dyslipidemia, hypertension, post-menopausal, a sedentary lifestyle and family history.    Review of Systems  Constitutional: Positive for fatigue. Negative for chills, fever and unexpected weight change.  HENT: Negative for trouble swallowing and voice change.   Eyes: Negative for blurred vision and visual disturbance.  Respiratory: Negative for cough, shortness of breath and wheezing.   Cardiovascular: Negative for chest pain, palpitations and leg swelling.  Gastrointestinal: Negative for diarrhea, nausea and vomiting.  Endocrine: Positive for polydipsia and polyuria. Negative for cold intolerance, heat  intolerance and polyphagia.  Musculoskeletal: Negative for arthralgias and myalgias.  Skin: Negative for color change, pallor, rash and wound.  Neurological: Negative for seizures and headaches.  Psychiatric/Behavioral: Negative for confusion and suicidal ideas.    Objective:    BP 137/79   Pulse 90   Ht _0  (1.575 m)   Wt 151 lb (68.5 kg)   BMI 27.62 kg/m   Wt Readings from Last 3 Encounters:  06/17/17 151 lb (68.5 kg)  06/17/17 151 lb (68.5 kg)  05/08/17 158 lb (71.7 kg)     Physical Exam  Constitutional: She is oriented to person, place, and time. She appears well-developed.  HENT:  Head: Normocephalic and atraumatic.  Eyes: EOM are normal.  Neck: Normal range of motion. Neck supple. No tracheal deviation present. No thyromegaly present.  Cardiovascular: Normal rate and regular rhythm.  Pulmonary/Chest: Effort normal. She has wheezes. She has rales.  Abdominal: Soft. Bowel sounds are normal. There is no tenderness. There is no guarding.  Musculoskeletal: Normal range of motion. She exhibits no edema.  Neurological: She is alert and oriented to person, place, and time. She has normal reflexes. No cranial nerve deficit. Coordination normal.  Skin: Skin is warm and dry. No rash noted. No erythema. No pallor.  Psychiatric: She has a normal mood and affect. Judgment normal.   CMP ( most recent) CMP     Component Value Date/Time   NA 142 06/11/2017 0824   K 4.2 06/11/2017 0824   CL 99 06/11/2017 0824   CO2 27 06/11/2017 0824   GLUCOSE 91 06/11/2017 0824   GLUCOSE 456 (H) 12/27/2016 0846   BUN 16 06/11/2017 0824   CREATININE 0.71 06/11/2017 0824   CALCIUM 10.0 06/11/2017 0824   PROT 7.3 06/11/2017 0824   ALBUMIN 4.3 06/11/2017 0824   AST 19 06/11/2017 0824   ALT 20 06/11/2017 0824   ALKPHOS 48 06/11/2017 0824   BILITOT 0.4 06/11/2017 0824   GFRNONAA 94 06/11/2017 0824   GFRAA 108 06/11/2017 0824     Diabetic Labs (most recent): Lab Results  Component Value  Date   HGBA1C 11.9 (H) 02/25/2017    Lab Results  Component Value Date   TSH 3.310 06/11/2017   TSH 2.715 09/25/2016   TSH 0.823 07/18/2016   FREET4 1.53 06/11/2017   Lipid Panel     Component Value Date/Time   CHOL 187 06/11/2017 0824   TRIG 75 06/11/2017 0824   HDL 73 06/11/2017 0824   LDLCALC 99 06/11/2017 0824    Assessment & Plan:   1. DM type 2 causing vascular disease ([place])  - Kathy Tran has currently uncontrolled symptomatic type 2 DM since  62 years of  age. - She came with tightly controlled blood glucose profile both fasting and postprandial.  Her average blood glucose is between 111 and 134 over last month.  Her A1c is slowly improving to 8.3% from 11.9%.   Recent labs reviewed.  -her diabetes is complicated by recent requirement for high-dose steroids related to her COPD, sedentary life, and DOLOROS KWOLEK remains at a high risk for more acute and chronic complications which include CAD, CVA, CKD, retinopathy, and neuropathy. These are all discussed in detail with the patient.  - I have counseled her on diet management and weight loss, by adopting a carbohydrate restricted/protein rich diet.  -  Suggestion is made for her to avoid simple carbohydrates  from her diet including Cakes, Sweet Desserts / Pastries, Ice Cream, Soda (diet and regular), Sweet Tea, Candies, Chips, Cookies, Store Bought Juices, Alcohol in Excess of  1-2 drinks a day, Artificial Sweeteners, and "Sugar-free" Products. This will help patient to have stable blood glucose profile and potentially avoid unintended weight gain.   - I encouraged her to switch to  unprocessed or minimally processed complex starch and increased protein intake (animal or plant source), fruits, and vegetables.  - she is advised to stick to a routine mealtimes to eat 3 meals  a day and avoid unnecessary snacks ( to snack only to correct hypoglycemia).    - I have approached her with the following individualized plan to  manage diabetes and patient agrees:   -Her average blood glucose is between 111-134 for the last 30 days, 95% of her blood glucose readings are on or near target.  -At this time, she would be tried off of prandial insulin-hold Humalog for now. -  I advised her to continue Levemir 40 units daily at bedtime,   associated with monitoring of glucose 4 times a day-before meals and at bedtime.  - Patient is warned not to take insulin without proper monitoring per orders.  -Patient is encouraged to call clinic for blood glucose levels less than 70 or above 200 mg /dl. - I will continue metformin 1000 mg by mouth twice a day, therapeutically suitable for patient .  - she advised to continue Victoza 1.2 mg subcutaneously daily. - Patient specific target  A1c;  LDL, HDL, Triglycerides, and  Waist Circumference were discussed in detail.  2) BP/HTN: Her blood pressure is controlled to target.   I advised her to continue her current blood pressure medications  hydrochlorothiazide 25 mg by mouth daily, carvedilol 25 mg by mouth twice a day. She said to be allergic to sulfa medications. 3) Lipids/HPL: Uncontrolled with LDL of 99.    Patient is advised to continue statins-pravastatin 10 mg p.o. nightly.  Side effects and precautions discussed with her.  4)  Weight/Diet: CDE Consult has been initiated , exercise, and detailed carbohydrates information provided.  5) Chronic Care/Health Maintenance:  -she  is Statin medications and  is encouraged to continue to follow up with Ophthalmology, Dentist,  Podiatrist at least yearly or according to recommendations, and advised to  stay away from smoking. I have recommended yearly flu vaccine and pneumonia vaccination at least every 5 years; moderate intensity exercise for up to 150 minutes weekly; and  sleep for at least 7 hours a day.  - I advised patient to maintain close follow up with Asencion Noble, MD for primary care needs.  - Time spent with the patient: 25 min,  of which >50% was spent in reviewing her blood glucose logs ,  discussing her hypo- and hyper-glycemic episodes, reviewing her current and  previous labs and insulin doses and developing a plan to avoid hypo- and hyper-glycemia. Please refer to Patient Instructions for Blood Glucose Monitoring and Insulin/Medications Dosing Guide"  in media tab for additional information.   Follow up plan: - Return in about 2 weeks (around 07/01/2017) for follow up with meter and logs- no labs.  Glade Lloyd, MD [facility] [address] [place], [place] [address] Phone: [phone]  Fax: [phone]    06/17/2017, 2:21 PM  This note was partially dictated with voice recognition software. Similar sounding words can be transcribed inadequately or may not  be corrected upon review.

## 2017-06-17 NOTE — Progress Notes (Signed)
  Medical Nutrition Therapy:  Appt start time: 1000 end time:  1030.   Assessment:  Primary concerns today: Diabetes Type 2.. Feels much better. Staying full. No low blood sugars and all BS less than 140 mg/dl other than when she got steriods. Has kep a food journal. Saw Dr. Dorris Fetch today. Stopped meal time insulin. Will continue to take 40 units of Levemir and Victoza and Metformin.  BS are excellent. Had steroids for 2 weeks that elevated BS but rest of BS are very good. BS avg 120-140's. Feels better. More energy.  Lost 7 lbs.    A1C 8.3%.  Lab Results  Component Value Date   HGBA1C 11.9 (H) 02/25/2017  FBS"  Preferred Learning Style:   No preference indicated   Learning Readiness:   Ready  Change in progress   MEDICATIONS:   DIETARY INTAKE:  24-hr recall:  B ( AM): eggs 2, ww toast LS bacon, fruit, water Snk ( AM):   L ( PM):  Hamburger on ww bread 2, small baked potatoe and water,mango   Snk ( PM):  D ( PM): Baked ham, sweet pototo, squash,  Mango , 1 slice ww toast .  Snk ( PM):  Beverages: water, diet sodas,coffee  Usual physical activity: ADL, walks on steps.  Estimated energy needs: 1500  calories 170 g carbohydrates 112 g protein 42 g fat  Progress Towards Goal(s):  In progress.   Nutritional Diagnosis:  NB-1.1 Food and nutrition-related knowledge deficit As related to Diabetes Type 2.  As evidenced by A1C 11.9%.    Intervention:  Nutrition and Diabetes education provided on My Plate, CHO counting, meal planning, portion sizes, timing of meals, avoiding snacks between meals unless having a low blood sugar, target ranges for A1C and blood sugars, signs/symptoms and treatment of hyper/hypoglycemia, monitoring blood sugars, taking medications as prescribed, benefits of exercising 30 minutes per day and prevention of complications of DM. Marland Kitchen Goals 1. Stick to eat 45-60 grams 2. Continue to drink water and fresh fruits and vegetables. 3. Keep up the great  job!!! Lose 2  Lbs per month Keep track of BS and log sheets and food journal Stop meal time insulin.   Teaching Method Utilized:  Visual Auditory Hands on  Handouts given during visit include:  The Plate MEthod   Meal Plan Card  Diabetes Instructions.     Barriers to learning/adherence to lifestyle change: none  Demonstrated degree of understanding via:  Teach Back   Monitoring/Evaluation:  Dietary intake, exercise, meal planning,SBG, and body weight in 3 month(s).

## 2017-06-17 NOTE — Patient Instructions (Signed)

## 2017-06-17 NOTE — Patient Instructions (Addendum)
Goals 1. Stick to eat 45-60 grams 2. Continue to drink water and fresh fruits and vegetables. 3. Keep up the great job!!! Lose 2  Lbs per month Keep track of BS and log sheets and food journal

## 2017-06-23 DIAGNOSIS — J4550 Severe persistent asthma, uncomplicated: Secondary | ICD-10-CM | POA: Diagnosis not present

## 2017-06-25 ENCOUNTER — Ambulatory Visit (INDEPENDENT_AMBULATORY_CARE_PROVIDER_SITE_OTHER): Payer: 59 | Admitting: *Deleted

## 2017-06-25 DIAGNOSIS — J4550 Severe persistent asthma, uncomplicated: Secondary | ICD-10-CM

## 2017-06-27 DIAGNOSIS — J4550 Severe persistent asthma, uncomplicated: Secondary | ICD-10-CM | POA: Diagnosis not present

## 2017-06-27 DIAGNOSIS — D801 Nonfamilial hypogammaglobulinemia: Secondary | ICD-10-CM | POA: Diagnosis not present

## 2017-06-27 DIAGNOSIS — J449 Chronic obstructive pulmonary disease, unspecified: Secondary | ICD-10-CM | POA: Diagnosis not present

## 2017-07-01 DIAGNOSIS — D839 Common variable immunodeficiency, unspecified: Secondary | ICD-10-CM | POA: Diagnosis not present

## 2017-07-02 ENCOUNTER — Encounter: Payer: Self-pay | Admitting: "Endocrinology

## 2017-07-02 ENCOUNTER — Ambulatory Visit (INDEPENDENT_AMBULATORY_CARE_PROVIDER_SITE_OTHER): Payer: 59 | Admitting: "Endocrinology

## 2017-07-02 VITALS — BP 133/81 | HR 82 | Ht 62.0 in | Wt 154.0 lb

## 2017-07-02 DIAGNOSIS — Z780 Asymptomatic menopausal state: Secondary | ICD-10-CM

## 2017-07-02 DIAGNOSIS — I10 Essential (primary) hypertension: Secondary | ICD-10-CM | POA: Diagnosis not present

## 2017-07-02 DIAGNOSIS — E782 Mixed hyperlipidemia: Secondary | ICD-10-CM | POA: Diagnosis not present

## 2017-07-02 DIAGNOSIS — E1159 Type 2 diabetes mellitus with other circulatory complications: Secondary | ICD-10-CM

## 2017-07-02 NOTE — Patient Instructions (Signed)

## 2017-07-02 NOTE — Progress Notes (Signed)
Consult Note       07/02/2017, 11:49 AM   Subjective:    Patient ID: Kathy Tran, female    DOB: 1956-02-26.  Kathy Tran is being seen in f/u for management of currently uncontrolled symptomatic diabetes requested by  Asencion Noble, MD.   Past Medical History:  Diagnosis Date  . Asthma    Flu march and May 2018  . Diabetes mellitus type 2, controlled ([place])    x5 yrs  . Eczema   . Heart murmur   . Hypertension    x 5 yrs  . Recurrent upper respiratory infection (URI)   . Renal disorder    renal stones   Past Surgical History:  Procedure Laterality Date  . [place]  . COLONOSCOPY  03/27/2011   Procedure: COLONOSCOPY;  Surgeon: Rogene Houston, MD;  Location: [facility];  Service: Endoscopy;  Laterality: N/A;  2:00  . CYSTOSCOPY W/ URETERAL STENT PLACEMENT Left 11/08/2015   Procedure: CYSTOSCOPY, URETEROSCOPY, WITH STONE BASKET EXTRACTION;  Surgeon: Raynelle Bring, MD;  Location: [facility];  Service: Urology;  Laterality: Left;  . CYSTOSCOPY/RETROGRADE/URETEROSCOPY/STONE EXTRACTION WITH BASKET Right 07/03/2015   Procedure: CYSTOSCOPY/RETROGRADE/URETEROSCOPY/STONE EXTRACTION WITH BASKET;  Surgeon: Irine Seal, MD;  Location: [facility];  Service: Urology;  Laterality: Right;  . ENDOMETRIAL ABLATION    . FLEXIBLE BRONCHOSCOPY Bilateral 10/31/2016   Procedure: FLEXIBLE BRONCHOSCOPY WITH PROPOFOL;  Surgeon: Sinda Du, MD;  Location: [facility];  Service: Cardiopulmonary;  Laterality: Bilateral;  . FLEXIBLE BRONCHOSCOPY N/A 01/17/2017   Procedure: FLEXIBLE BRONCHOSCOPY WITH PROPOFOL;  Surgeon: Sinda Du, MD;  Location: [facility];  Service: Cardiopulmonary;  Laterality: N/A;  . KIDNEY STONE SURGERY     multiple  . SHOULDER SURGERY    . TONSILLECTOMY    . TUBAL LIGATION  1991   Social History   Socioeconomic History  . Marital status: Married    Spouse name: None  .  Number of children: None  . Years of education: None  . Highest education level: None  Social Needs  . Financial resource strain: None  . Food insecurity - worry: None  . Food insecurity - inability: None  . Transportation needs - medical: None  . Transportation needs - non-medical: None  Occupational History  . None  Tobacco Use  . Smoking status: Never Smoker  . Smokeless tobacco: Never Used  Substance and Sexual Activity  . Alcohol use: Yes    Comment: occ  . Drug use: No  . Sexual activity: Yes    Birth control/protection: Surgical  Other Topics Concern  . None  Social History Narrative  . None   Outpatient Encounter Medications as of 07/02/2017  Medication Sig  . aspirin EC 81 MG tablet Take 1 tablet by mouth on Monday, Wednesday, and Friday  . carvedilol (COREG) 25 MG tablet Take 25 mg by mouth 2 (two) times daily with a meal.  . diphenhydrAMINE (BENADRYL) 25 MG tablet Take 25 mg by mouth at bedtime.  . fluconazole (DIFLUCAN) 100 MG tablet   . Fluticasone-Umeclidin-Vilant (TRELEGY ELLIPTA) 100-62.5-25 MCG/INH AEPB Inhale 1 puff into the  lungs daily.  Marland Kitchen guaiFENesin (MUCINEX) 600 MG 12 hr tablet Take 1 tablet (600 mg total) by mouth 2 (two) times daily.  . hydrochlorothiazide (HYDRODIURIL) 25 MG tablet Take 25 mg by mouth daily.   . Insulin Detemir (LEVEMIR FLEXTOUCH) 100 UNIT/ML Pen Inject 40 Units daily at 10 pm into the skin.  Marland Kitchen ipratropium-albuterol (DUONEB) 0.5-2.5 (3) MG/3ML SOLN Take 3 mLs by nebulization 3 (three) times daily.  Marland Kitchen LORazepam (ATIVAN) 0.5 MG tablet   . metFORMIN (GLUCOPHAGE-XR) 500 MG 24 hr tablet Take 1,000 mg by mouth 2 (two) times daily.  . montelukast (SINGULAIR) 10 MG tablet Take 1 tablet (10 mg total) by mouth at bedtime.  . pantoprazole (PROTONIX) 40 MG tablet Take 1 tablet (40 mg total) by mouth 2 (two) times daily before a meal.  . pravastatin (PRAVACHOL) 10 MG tablet Take 10 mg by mouth every evening.  . VENTOLIN HFA 108 (90 Base) MCG/ACT  inhaler Inhale 2 puffs into the lungs every 4 (four) hours.   Marland Kitchen VICTOZA 18 MG/3ML SOPN Inject 1.2 mg into the skin at bedtime.    Facility-Administered Encounter Medications as of 07/02/2017  Medication  . Mepolizumab SOLR 100 mg    ALLERGIES: Allergies  Allergen Reactions  . Sulfa Antibiotics Anaphylaxis    VACCINATION STATUS:  There is no immunization history on file for this patient.  Diabetes  She presents for her follow-up diabetic visit. She has type 2 diabetes mellitus. Onset time: She was diagnosed at approximate age of 62 years. Her disease course has been stable (Worsening due to concurrent therapy with prednisone 20 mg daily related to her COPD.). There are no hypoglycemic associated symptoms. Pertinent negatives for hypoglycemia include no confusion, headaches, pallor or seizures. Associated symptoms include fatigue, polydipsia and polyuria. Pertinent negatives for diabetes include no blurred vision, no chest pain and no polyphagia. There are no hypoglycemic complications. Symptoms are stable. There are no diabetic complications. Risk factors for coronary artery disease include diabetes mellitus, hypertension, sedentary lifestyle and dyslipidemia. Current diabetic treatment includes oral agent (monotherapy) and insulin injections. Her weight is increasing steadily. She is following a generally unhealthy diet. When asked about meal planning, she reported none. She has not had a previous visit with a dietitian. She never participates in exercise. Her home blood glucose trend is increasing rapidly. Her breakfast blood glucose range is generally 110-130 mg/dl. Her lunch blood glucose range is generally 110-130 mg/dl. Her dinner blood glucose range is generally 130-140 mg/dl. Her bedtime blood glucose range is generally 130-140 mg/dl. Her overall blood glucose range is 130-140 mg/dl. An ACE inhibitor/angiotensin II receptor blocker is not being taken. She does not see a podiatrist.Eye exam  is not current.  Hypertension  This is a chronic problem. The current episode started more than 1 year ago. The problem is controlled. Pertinent negatives include no blurred vision, chest pain, headaches, palpitations or shortness of breath. Risk factors for coronary artery disease include diabetes mellitus, dyslipidemia, family history, sedentary lifestyle and post-menopausal state. Past treatments include diuretics.  Hyperlipidemia  This is a chronic problem. The current episode started more than 1 year ago. The problem is controlled. Exacerbating diseases include diabetes. Pertinent negatives include no chest pain, myalgias or shortness of breath. Current antihyperlipidemic treatment includes statins. The current treatment provides moderate improvement of lipids. Risk factors for coronary artery disease include diabetes mellitus, dyslipidemia, hypertension, post-menopausal, a sedentary lifestyle and family history.    Review of Systems  Constitutional: Positive for fatigue. Negative for chills,  fever and unexpected weight change.  HENT: Negative for trouble swallowing and voice change.   Eyes: Negative for blurred vision and visual disturbance.  Respiratory: Negative for cough, shortness of breath and wheezing.   Cardiovascular: Negative for chest pain, palpitations and leg swelling.  Gastrointestinal: Negative for diarrhea, nausea and vomiting.  Endocrine: Positive for polydipsia and polyuria. Negative for cold intolerance, heat intolerance and polyphagia.  Musculoskeletal: Negative for arthralgias and myalgias.  Skin: Negative for color change, pallor, rash and wound.  Neurological: Negative for seizures and headaches.  Psychiatric/Behavioral: Negative for confusion and suicidal ideas.    Objective:    BP 133/81   Pulse 82   Ht _0  (1.575 m)   Wt 154 lb (69.9 kg)   BMI 28.17 kg/m   Wt Readings from Last 3 Encounters:  07/02/17 154 lb (69.9 kg)  06/17/17 151 lb (68.5 kg)   06/17/17 151 lb (68.5 kg)     Physical Exam  Constitutional: She is oriented to person, place, and time. She appears well-developed.  HENT:  Head: Normocephalic and atraumatic.  Eyes: EOM are normal.  Neck: Normal range of motion. Neck supple. No tracheal deviation present. No thyromegaly present.  Cardiovascular: Normal rate and regular rhythm.  Pulmonary/Chest: Effort normal. She has wheezes. She has rales.  Abdominal: Soft. Bowel sounds are normal. There is no tenderness. There is no guarding.  Musculoskeletal: Normal range of motion. She exhibits no edema.  Neurological: She is alert and oriented to person, place, and time. She has normal reflexes. No cranial nerve deficit. Coordination normal.  Skin: Skin is warm and dry. No rash noted. No erythema. No pallor.  Psychiatric: She has a normal mood and affect. Judgment normal.   CMP ( most recent) CMP     Component Value Date/Time   NA 142 06/11/2017 0824   K 4.2 06/11/2017 0824   CL 99 06/11/2017 0824   CO2 27 06/11/2017 0824   GLUCOSE 91 06/11/2017 0824   GLUCOSE 456 (H) 12/27/2016 0846   BUN 16 06/11/2017 0824   CREATININE 0.71 06/11/2017 0824   CALCIUM 10.0 06/11/2017 0824   PROT 7.3 06/11/2017 0824   ALBUMIN 4.3 06/11/2017 0824   AST 19 06/11/2017 0824   ALT 20 06/11/2017 0824   ALKPHOS 48 06/11/2017 0824   BILITOT 0.4 06/11/2017 0824   GFRNONAA 94 06/11/2017 0824   GFRAA 108 06/11/2017 0824     Diabetic Labs (most recent): Lab Results  Component Value Date   HGBA1C 8.3 06/17/2017   HGBA1C 11.9 (H) 02/25/2017    Lab Results  Component Value Date   TSH 3.310 06/11/2017   TSH 2.715 09/25/2016   TSH 0.823 07/18/2016   FREET4 1.53 06/11/2017   Lipid Panel     Component Value Date/Time   CHOL 187 06/11/2017 0824   TRIG 75 06/11/2017 0824   HDL 73 06/11/2017 0824   LDLCALC 99 06/11/2017 0824    Assessment & Plan:   1. DM type 2 causing vascular disease ([place])  - Kathy Tran has currently  uncontrolled symptomatic type 2 DM since  62 years of age. - She came with stable and controlled glycemic profile despite the fact that we stop her prandial insulin last visit.    Her recent A1c has been improving to 8.3% from 11.9%.   Recent labs reviewed.  -her diabetes is complicated by recent requirement for high-dose steroids related to her COPD, sedentary life, and [place] remains at a high risk for  more acute and chronic complications which include CAD, CVA, CKD, retinopathy, and neuropathy. These are all discussed in detail with the patient.  - I have counseled her on diet management and weight loss, by adopting a carbohydrate restricted/protein rich diet.  -  Suggestion is made for her to avoid simple carbohydrates  from her diet including Cakes, Sweet Desserts / Pastries, Ice Cream, Soda (diet and regular), Sweet Tea, Candies, Chips, Cookies, Store Bought Juices, Alcohol in Excess of  1-2 drinks a day, Artificial Sweeteners, and "Sugar-free" Products. This will help patient to have stable blood glucose profile and potentially avoid unintended weight gain.   - I encouraged her to switch to  unprocessed or minimally processed complex starch and increased protein intake (animal or plant source), fruits, and vegetables.  - she is advised to stick to a routine mealtimes to eat 3 meals  a day and avoid unnecessary snacks ( to snack only to correct hypoglycemia).    - I have approached her with the following individualized plan to manage diabetes and patient agrees:   - 95% of her blood glucose readings are on or near target, despite the fact that she was taken off of her prandial insulin last visit.  -  I advised her to continue Levemir 40 units daily at bedtime,   associated with monitoring of glucose 2 times a day-before breakfast and at bedtime.  - Patient is warned not to take insulin without proper monitoring per orders.  -Patient is encouraged to call clinic for blood glucose  levels less than 70 or above 200 mg /dl. - I will continue metformin 1000 mg by mouth twice a day, therapeutically suitable for patient .  - she advised to continue Victoza 1.2 mg subcutaneously daily.  - Patient specific target  A1c;  LDL, HDL, Triglycerides, and  Waist Circumference were discussed in detail.  2) BP/HTN: Her blood pressure is controlled to target.  I advised her to continue her current blood pressure medications including:  hydrochlorothiazide 25 mg by mouth daily, carvedilol 25 mg by mouth twice a day. She said to be allergic to sulfa medications.   3) Lipids/HPL: Uncontrolled with LDL of 99.    Patient is advised to continue statins-pravastatin 10 mg p.o. nightly.  Side effects and precautions discussed with her.  4)  Weight/Diet: CDE Consult has been initiated , exercise, and detailed carbohydrates information provided.  5) Chronic Care/Health Maintenance:  -she  is Statin medications and  is encouraged to continue to follow up with Ophthalmology, Dentist,  Podiatrist at least yearly or according to recommendations, and advised to  stay away from smoking. I have recommended yearly flu vaccine and pneumonia vaccination at least every 5 years; moderate intensity exercise for up to 150 minutes weekly; and  sleep for at least 7 hours a day.  - I advised patient to maintain close follow up with Asencion Noble, MD for primary care needs.  - Time spent with the patient: 25 min, of which >50% was spent in reviewing her blood glucose logs , discussing her hypo- and hyper-glycemic episodes, reviewing her current and  previous labs and insulin doses and developing a plan to avoid hypo- and hyper-glycemia. Please refer to Patient Instructions for Blood Glucose Monitoring and Insulin/Medications Dosing Guide"  in media tab for additional information.  Follow up plan: - Return in about 3 months (around 09/29/2017) for meter, and logs, follow up with pre-visit labs, meter, and logs, follow up  with [facility].  Heriberto Antigua  Dorris Fetch, MD [facility] [address] [place], [place] [address] Phone: [phone]  Fax: [phone]    07/02/2017, 11:49 AM  This note was partially dictated with voice recognition software. Similar sounding words can be transcribed inadequately or may not  be corrected upon review.

## 2017-07-10 DIAGNOSIS — J4550 Severe persistent asthma, uncomplicated: Secondary | ICD-10-CM | POA: Diagnosis not present

## 2017-07-10 DIAGNOSIS — I10 Essential (primary) hypertension: Secondary | ICD-10-CM | POA: Diagnosis not present

## 2017-07-10 DIAGNOSIS — D839 Common variable immunodeficiency, unspecified: Secondary | ICD-10-CM | POA: Diagnosis not present

## 2017-07-16 ENCOUNTER — Telehealth: Payer: Self-pay | Admitting: Allergy & Immunology

## 2017-07-16 DIAGNOSIS — D801 Nonfamilial hypogammaglobulinemia: Secondary | ICD-10-CM | POA: Diagnosis not present

## 2017-07-16 DIAGNOSIS — J4550 Severe persistent asthma, uncomplicated: Secondary | ICD-10-CM | POA: Diagnosis not present

## 2017-07-16 NOTE — Telephone Encounter (Signed)
I received an email from Ms. Berenguer. Evidently, she went to see Pulmonology at [facility], who did not have much to add to her treatment. However, they did refer her to see Dr. Evern Bio in the Immunology Department. Therefore I will send a letter to her so that she has some background information on Ms. Panebianco before her appointment. Letter prepared and routed to Dr. Evern Bio. We will also fax a copy to her office tomorrow to ensure that she has it.   Salvatore Marvel, MD [facility]

## 2017-07-17 ENCOUNTER — Other Ambulatory Visit: Payer: Self-pay | Admitting: Allergy & Immunology

## 2017-07-18 DIAGNOSIS — J4550 Severe persistent asthma, uncomplicated: Secondary | ICD-10-CM | POA: Diagnosis not present

## 2017-07-22 ENCOUNTER — Other Ambulatory Visit: Payer: Self-pay | Admitting: "Endocrinology

## 2017-07-22 ENCOUNTER — Telehealth: Payer: Self-pay | Admitting: "Endocrinology

## 2017-07-22 ENCOUNTER — Ambulatory Visit (INDEPENDENT_AMBULATORY_CARE_PROVIDER_SITE_OTHER): Payer: 59 | Admitting: *Deleted

## 2017-07-22 DIAGNOSIS — J4550 Severe persistent asthma, uncomplicated: Secondary | ICD-10-CM | POA: Diagnosis not present

## 2017-07-22 MED ORDER — INSULIN DEGLUDEC 100 UNIT/ML ~~LOC~~ SOPN: 40.0000 [IU] | PEN_INJECTOR | Freq: Every day | SUBCUTANEOUS | 2 refills | Status: DC

## 2017-07-22 NOTE — Telephone Encounter (Signed)
Kathy Tran is about out of the Levamir 40 units and she states Dr. Dorris Fetch was going to change her to something else please advise?

## 2017-07-22 NOTE — Telephone Encounter (Signed)
It is going to be [place]. I will send to pharmacy.

## 2017-07-23 ENCOUNTER — Ambulatory Visit: Payer: Self-pay

## 2017-07-25 DIAGNOSIS — M1811 Unilateral primary osteoarthritis of first carpometacarpal joint, right hand: Secondary | ICD-10-CM | POA: Diagnosis not present

## 2017-07-29 ENCOUNTER — Ambulatory Visit: Payer: 59 | Admitting: Allergy & Immunology

## 2017-07-29 ENCOUNTER — Encounter: Payer: Self-pay | Admitting: Allergy & Immunology

## 2017-07-29 VITALS — BP 132/82 | HR 83 | Temp 97.9°F | Resp 18

## 2017-07-29 DIAGNOSIS — D801 Nonfamilial hypogammaglobulinemia: Secondary | ICD-10-CM | POA: Diagnosis not present

## 2017-07-29 DIAGNOSIS — J310 Chronic rhinitis: Secondary | ICD-10-CM | POA: Diagnosis not present

## 2017-07-29 DIAGNOSIS — J449 Chronic obstructive pulmonary disease, unspecified: Secondary | ICD-10-CM

## 2017-07-29 DIAGNOSIS — J4550 Severe persistent asthma, uncomplicated: Secondary | ICD-10-CM | POA: Diagnosis not present

## 2017-07-29 NOTE — Patient Instructions (Addendum)
1. 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  - Daily controller medication(s): Arnuity 2101mcg one puff in the evening + Trelegy one puff once in the morning + Singulair 10mg  daily + Nucala monthly - Rescue medications: ProAir 4 puffs every 4-6 hours as needed or DuoNeb nebulizer one vial every 4-6 hours as needed - Asthma control goals:  * Full participation in all desired activities (may need albuterol before activity) * Albuterol use two time or less a week on average (not counting use with activity) * Cough interfering with sleep two time or less a month * Oral steroids no more than once a year * No hospitalizations  2. Chronic rhinitis - with negative blood environmental allergen testing - Continue with Benadryl 25mg  once daily.   3. Right ear pain - Continue with over the counter pain medications for now. - If it is not improving by Thursday or Friday, give [place] a call and we can send in an antibiotic.   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 - We will anticipate coming off of your IgG replacement in the summer to see how you do.  5. Return in about 4 months (around 11/28/2017).   Please inform us of any Emergency Department visits, hospitalizations, or changes in symptoms. Call us before going to the ED for breathing or allergy symptoms since we might be able to fit you in for a sick visit. Feel free to contact us anytime with any questions, problems, or concerns.  It was a pleasure to see you again today!  Websites that have reliable patient information: 1. American Academy of Asthma, Allergy, and Immunology: www.aaaai.org 2. Food Allergy Research and Education (FARE): foodallergy.org 3. Mothers of Asthmatics: http://www.asthmacommunitynetwork.org 4. American College of Allergy, Asthma, and Immunology: www.acaai.org

## 2017-07-29 NOTE — Progress Notes (Signed)
FOLLOW UP  Date of Service/Encounter:  07/29/17   Assessment:    Severe persistent asthmawithCOPD overlap  Non-allergic rhinitis  Recurrent infectionswith hypogammaglobulinemia - on Cuvitru (600mg /kg/month) (transitioning to IVIG)  Chronic steroid use x 7 months (Feb 2018 -December 2018)- now off prednisone  Multiple lung nodules in the right lung - needs repeat chest CT spring 2019   Insulin dependent diabetes -followed by Dr. Dorris Fetch  GERD - on pantoprazole   Asthma Reportables:  Severity: severe persistent  Risk: high Control: well controlled  Plan/Recommendations:   1. 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  - Daily controller medication(s): Arnuity 274mcg one puff in the evening + Trelegy one puff once in the morning + Singulair 10mg  daily + Nucala monthly - Rescue medications: ProAir 4 puffs every 4-6 hours as needed or DuoNeb nebulizer one vial every 4-6 hours as needed - Asthma control goals:  * Full participation in all desired activities (may need albuterol before activity) * Albuterol use two time or less a week on average (not counting use with activity) * Cough interfering with sleep two time or less a month * Oral steroids no more than once a year * No hospitalizations  2. Chronic rhinitis - with negative blood environmental allergen testing - Continue with Benadryl 25mg  once daily.   3. Right ear pain - Continue with over the counter pain medications for now. - If it is not improving by Thursday or Friday, give [place] a call and we can send in an antibiotic.   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 - We will anticipate coming off of your IgG replacement in the summer to see how you do. - We may consider sending her to see Dr. Silverio Lay again once she is off immunoglobulin replacement since she would be able to do workup at that time (once exogenous IgG has been fully  metabolized).   5. Return in about 4 months (around 11/28/2017).   Subjective:   Kathy Tran is a 62 y.o. female presenting today for follow up of  Chief Complaint  Patient presents with  . Asthma  . Sore Throat  . Cough    Kathy Tran has a history of the following: Patient Active Problem List   Diagnosis Date Noted  . Postmenopausal 07/02/2017  . Hypogammaglobulinemia ([place]) 04/29/2017  . Recurrent infections 04/29/2017  . Non-allergic rhinitis 04/29/2017  . Severe persistent asthma without complication 09/62/8366  . Essential hypertension, benign 03/20/2017  . Asthma exacerbation 12/26/2016  . Bronchitis 09/25/2016  . Asthmatic bronchitis 07/18/2016  . DM type 2 causing vascular disease ([place]) 07/18/2016  . GERD (gastroesophageal reflux disease) 07/18/2016  . Mixed hyperlipidemia 07/18/2016  . History of colonic polyps 06/12/2016    History obtained from: chart review and patient.  Kathy Tran's Primary Care Provider is Asencion Noble, MD.     Bobbye is a 62 y.o. female presenting for a follow up visit. She was seen for her first appointment in September 2018. At that time, she had a prolonged history of shortness of breath and wheezing including a seven-month history of steroid dependence. She had undergone two bronchoscopies with unenlightening workups. Secondary to her steroids, she was developing type 2 diabetes as well as cushingoid facies. Her previous history of asthma was very vague, and really only included one episode of bronchitis. We obtainedlab work to rule out serious causes of asthma including allergic bronchopulmonary aspergillosis, alpha-1 antitrypsin deficiency, and hypersensitivity pneumonitis;  all of these were normal. Aside from slightly elevated IgG toAspergillosis, her workup was entirely normal. She did have a history of an elevated absolute eosinophil count of 300, therefore we started her on Nucala at that visit. Her smoking history was rather vague, and  she reported smoking only socially. We started her onTrelegy1 puff once daily in September 2018 and we added on Arnuity one puff in the evening to help bridge her transition off of prednisone. She did go to see Dr. Hortencia Pilar who was training under Dr. Rogue Bussing; they felt that her story was consistent with asthma. She then went to see Dr. Isaac Laud in Immunology at [facility], who felt comfortable with our current plan.   Since the last visit, she has mostly done well. She did start feeling tight on Sunday with a hacking cough. She has been using her rescue inhaler. She has felt a lot of nasal drainage and feels fairly decent. Her husband was sick and since that time, Kathy Tran developed right ear pain. She does have some tenderness on the right side of her neck. She has been using Debrox with success in removal of the wax, with large chunks coming out of her right ear the last time that she attempted to use it. During these last few days, she has not had a fever but has been using her rescue inhaler more than normal.   Otherwise, she remains on the Trelegy one puff daily, Arnuity 239mcg one puff daily. She also remains on the Nucala. Overall she feels that her asthma control has actually remained fairly stable. She is able to tolerate all ADLs on some days, but then other days she has trouble making it to the mail box to get the mail. She has not needed any ED visits or UC visits for her asthma. Her last course of prednisone was at the end of December/beginining of January. ACT today is 13 indicating subpar asthma control, but this is reflective of her current illness.   She has done well with IVIG, next one scheduled tomorrow. She does have a headache after the infusion despite adequate hydration. She does report a headache for around four days after the infusion. She does endorse nausea as well. She does report two weeks of aches after the infusions. Dr. Silverio Lay did not have any new ideas  regarding management, but anticipates waiting 6 months after the completion of the immunoglobulin replacement therapy before doing any more workup. She agreed with the plan to stop the immunoglobulin replacement this coming summer.   Otherwise, there have been no changes to her past medical history, surgical history, family history, or social history.    Review of Systems: a 14-point review of systems is pertinent for what is mentioned in HPI.  Otherwise, all other systems were negative. Constitutional: negative other than that listed in the HPI Eyes: negative other than that listed in the HPI Ears, nose, mouth, throat, and face: negative other than that listed in the HPI Respiratory: negative other than that listed in the HPI Cardiovascular: negative other than that listed in the HPI Gastrointestinal: negative other than that listed in the HPI Genitourinary: negative other than that listed in the HPI Integument: negative other than that listed in the HPI Hematologic: negative other than that listed in the HPI Musculoskeletal: negative other than that listed in the HPI Neurological: negative other than that listed in the HPI Allergy/Immunologic: negative other than that listed in the HPI    Objective:  Blood pressure 132/82, pulse 83, temperature 97.9 F (36.6 C), resp. rate 18, SpO2 96 %. There is no height or weight on file to calculate BMI.   Physical Exam:  General: Alert, interactive, in no acute distress. Smiling.  Eyes: No conjunctival injection bilaterally, no discharge on the right, no discharge on the left and no Horner-Trantas dots present. PERRL bilaterally. EOMI without pain. No photophobia.  Ears: Right TM pearly gray with normal light reflex, Left TM pearly gray with normal light reflex, Right TM intact without perforation and Left TM intact without perforation.  Nose/Throat: External nose within normal limits and septum midline. Turbinates edematous with clear  discharge. Posterior oropharynx erythematous without cobblestoning in the posterior oropharynx. Tonsils 2+ without exudates.  Tongue without thrush. Lungs: Clear to auscultation without wheezing, rhonchi or rales. No increased work of breathing. CV: Normal S1/S2. No murmurs. Capillary refill <2 seconds.  Skin: Warm and dry, without lesions or rashes. Neuro:   Grossly intact. No focal deficits appreciated. Responsive to questions.  Diagnostic studies:   Spirometry: results normal (FEV1: 1.84/91%, FVC: 2.30/84%, FEV1/FVC: 79%).    Spirometry consistent with normal pattern. Overall her values seem stable sine the last visit.   Allergy Studies: none     Salvatore Marvel, MD [facility]

## 2017-07-30 DIAGNOSIS — D839 Common variable immunodeficiency, unspecified: Secondary | ICD-10-CM | POA: Diagnosis not present

## 2017-07-31 ENCOUNTER — Telehealth: Payer: Self-pay | Admitting: Allergy & Immunology

## 2017-07-31 MED ORDER — DOXYCYCLINE HYCLATE 100 MG PO CAPS: 100.0000 mg | ORAL_CAPSULE | Freq: Two times a day (BID) | ORAL | 0 refills | Status: AC

## 2017-07-31 NOTE — Telephone Encounter (Signed)
Dr. Gallagher please advise.  

## 2017-07-31 NOTE — Telephone Encounter (Signed)
Reviewed note. I sent in doxycycline 100mg  twice daily for ten days.   I did call her to let her know her know. I recommended alternating acetaminophen and ibuprofen every 3-4 hours. Patient in agreement with the plan.   Salvatore Marvel, MD [facility] [place]

## 2017-07-31 NOTE — Telephone Encounter (Signed)
Patient saw Dr. Ernst Bowler on Tuesday, 07-29-17, in [place]. She had fluid in her ears. Dr. Ernst Bowler told her if she wasn't better by Thursday to call and he would send something in for her. She said she is worse. [employer].

## 2017-08-05 DIAGNOSIS — H43393 Other vitreous opacities, bilateral: Secondary | ICD-10-CM | POA: Diagnosis not present

## 2017-08-05 DIAGNOSIS — E119 Type 2 diabetes mellitus without complications: Secondary | ICD-10-CM | POA: Diagnosis not present

## 2017-08-05 DIAGNOSIS — H25813 Combined forms of age-related cataract, bilateral: Secondary | ICD-10-CM | POA: Diagnosis not present

## 2017-08-07 DIAGNOSIS — I10 Essential (primary) hypertension: Secondary | ICD-10-CM | POA: Diagnosis not present

## 2017-08-07 DIAGNOSIS — J4550 Severe persistent asthma, uncomplicated: Secondary | ICD-10-CM | POA: Diagnosis not present

## 2017-08-07 DIAGNOSIS — D839 Common variable immunodeficiency, unspecified: Secondary | ICD-10-CM | POA: Diagnosis not present

## 2017-08-18 ENCOUNTER — Other Ambulatory Visit: Payer: Self-pay | Admitting: Allergy & Immunology

## 2017-08-18 DIAGNOSIS — N200 Calculus of kidney: Secondary | ICD-10-CM | POA: Diagnosis not present

## 2017-08-18 DIAGNOSIS — I10 Essential (primary) hypertension: Secondary | ICD-10-CM | POA: Diagnosis not present

## 2017-08-19 ENCOUNTER — Ambulatory Visit (INDEPENDENT_AMBULATORY_CARE_PROVIDER_SITE_OTHER): Payer: 59

## 2017-08-19 DIAGNOSIS — J4550 Severe persistent asthma, uncomplicated: Secondary | ICD-10-CM | POA: Diagnosis not present

## 2017-08-22 DIAGNOSIS — J0101 Acute recurrent maxillary sinusitis: Secondary | ICD-10-CM | POA: Diagnosis not present

## 2017-08-22 DIAGNOSIS — D839 Common variable immunodeficiency, unspecified: Secondary | ICD-10-CM | POA: Diagnosis not present

## 2017-08-22 DIAGNOSIS — J4550 Severe persistent asthma, uncomplicated: Secondary | ICD-10-CM | POA: Diagnosis not present

## 2017-08-27 DIAGNOSIS — D839 Common variable immunodeficiency, unspecified: Secondary | ICD-10-CM | POA: Diagnosis not present

## 2017-08-28 DIAGNOSIS — D839 Common variable immunodeficiency, unspecified: Secondary | ICD-10-CM | POA: Diagnosis not present

## 2017-09-02 DIAGNOSIS — E119 Type 2 diabetes mellitus without complications: Secondary | ICD-10-CM | POA: Diagnosis not present

## 2017-09-02 DIAGNOSIS — Z794 Long term (current) use of insulin: Secondary | ICD-10-CM | POA: Diagnosis not present

## 2017-09-02 DIAGNOSIS — Z7984 Long term (current) use of oral hypoglycemic drugs: Secondary | ICD-10-CM | POA: Diagnosis not present

## 2017-09-03 ENCOUNTER — Other Ambulatory Visit (HOSPITAL_COMMUNITY): Payer: Self-pay | Admitting: Internal Medicine

## 2017-09-03 DIAGNOSIS — Z1231 Encounter for screening mammogram for malignant neoplasm of breast: Secondary | ICD-10-CM

## 2017-09-05 DIAGNOSIS — J4550 Severe persistent asthma, uncomplicated: Secondary | ICD-10-CM | POA: Diagnosis not present

## 2017-09-10 ENCOUNTER — Ambulatory Visit (HOSPITAL_COMMUNITY)
Admission: RE | Admit: 2017-09-10 | Discharge: 2017-09-10 | Disposition: A | Discharge status: Home / Self Care | Payer: 59 | Source: Ambulatory Visit | Attending: Internal Medicine | Admitting: Internal Medicine

## 2017-09-10 DIAGNOSIS — Z1231 Encounter for screening mammogram for malignant neoplasm of breast: Secondary | ICD-10-CM | POA: Diagnosis present

## 2017-09-11 DIAGNOSIS — E119 Type 2 diabetes mellitus without complications: Secondary | ICD-10-CM | POA: Diagnosis not present

## 2017-09-11 DIAGNOSIS — J45901 Unspecified asthma with (acute) exacerbation: Secondary | ICD-10-CM | POA: Diagnosis not present

## 2017-09-16 ENCOUNTER — Ambulatory Visit (INDEPENDENT_AMBULATORY_CARE_PROVIDER_SITE_OTHER): Payer: 59 | Admitting: *Deleted

## 2017-09-16 DIAGNOSIS — J4550 Severe persistent asthma, uncomplicated: Secondary | ICD-10-CM

## 2017-09-25 DIAGNOSIS — D839 Common variable immunodeficiency, unspecified: Secondary | ICD-10-CM | POA: Diagnosis not present

## 2017-09-26 DIAGNOSIS — M1811 Unilateral primary osteoarthritis of first carpometacarpal joint, right hand: Secondary | ICD-10-CM | POA: Diagnosis not present

## 2017-09-29 ENCOUNTER — Telehealth: Payer: Self-pay

## 2017-09-29 NOTE — Telephone Encounter (Signed)
Pt called stating that she had her IVIG infusion on Thursday and by Saturday had developed large spreading rash (hives?). Taking all meds + Benadryl with no relief. Per Dr. Ernst Bowler, have patient come into [place] clinic tomorrow. Pt. Advised.

## 2017-09-30 ENCOUNTER — Ambulatory Visit (INDEPENDENT_AMBULATORY_CARE_PROVIDER_SITE_OTHER): Payer: 59 | Admitting: Allergy & Immunology

## 2017-09-30 ENCOUNTER — Ambulatory Visit: Payer: 59 | Admitting: "Endocrinology

## 2017-09-30 ENCOUNTER — Ambulatory Visit: Payer: 59 | Admitting: Nutrition

## 2017-09-30 ENCOUNTER — Encounter: Payer: Self-pay | Admitting: Allergy & Immunology

## 2017-09-30 VITALS — Temp 97.7°F

## 2017-09-30 DIAGNOSIS — J310 Chronic rhinitis: Secondary | ICD-10-CM

## 2017-09-30 DIAGNOSIS — J4550 Severe persistent asthma, uncomplicated: Secondary | ICD-10-CM | POA: Diagnosis not present

## 2017-09-30 DIAGNOSIS — R21 Rash and other nonspecific skin eruption: Secondary | ICD-10-CM

## 2017-09-30 DIAGNOSIS — B999 Unspecified infectious disease: Secondary | ICD-10-CM

## 2017-09-30 DIAGNOSIS — D801 Nonfamilial hypogammaglobulinemia: Secondary | ICD-10-CM

## 2017-09-30 MED ORDER — TRIAMCINOLONE ACETONIDE 0.5 % EX OINT: TOPICAL_OINTMENT | CUTANEOUS | 1 refills | Status: DC

## 2017-09-30 NOTE — Progress Notes (Signed)
FOLLOW UP  Date of Service/Encounter:  09/30/17   Assessment:   Severe persistent asthmawithCOPD overlap  Non-allergic rhinitis  Recurrent infectionswith hypogammaglobulinemia - on on IVIG (600 mg/kg/month) - STOPPING IVIG   Chronic steroid use x 7 months (Feb 2018 -December 2018)- now off prednisone  Multiple lung nodules in the right lung - needs repeat chest CT spring 2019  Insulin dependent diabetes -followed byDr. Dorris Fetch  GERD - on pantoprazole   Asthma Reportables:  Severity: severe persistent  Risk: high Control: well controlled  Plan/Recommendations:   1. 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  - Daily controller medication(s): Trelegy one puff once in the morning + Singulair 10mg  daily + Nucala monthly - Rescue medications: ProAir 4 puffs every 4-6 hours as needed or DuoNeb nebulizer one vial every 4-6 hours as needed - Asthma control goals:  * Full participation in all desired activities (may need albuterol before activity) * Albuterol use two time or less a week on average (not counting use with activity) * Cough interfering with sleep two time or less a month * Oral steroids no more than once a year * No hospitalizations    2. Rash - possibly secondary to the IVIG - Increase cetirizine to 20mg  twice daily and continue for two weeks. - DepoMedrol 40mg  given today in clinic. - Start triamcinolone 0.1% ointment twice daily as needed to the worst areas.  - Hopefully this will resolve as more time goes on.   3. Chronic rhinitis - with negative blood allergen testing - Continue with Benadryl 25mg  once daily.   4. Hypogammaglobulinemia - We will stop the IVIG since we were planning to stop it in July anyway.  - We will change your follow up appointment to mid August so that we can check your immunoglobulin levels at that time.  - This will allow [place] to get a good evaluation of  her endogenous immunoglobulin production.   5. Return in about 3 months (around 12/31/2017).   Subjective:   Kathy Tran is a 62 y.o. female presenting today for follow up of  Chief Complaint  Patient presents with  . Rash    [name] has a history of the following: Patient Active Problem List   Diagnosis Date Noted  . Postmenopausal 07/02/2017  . Hypogammaglobulinemia ([place]) 04/29/2017  . Recurrent infections 04/29/2017  . Non-allergic rhinitis 04/29/2017  . Severe persistent asthma without complication 74/12/1446  . Essential hypertension, benign 03/20/2017  . Asthma exacerbation 12/26/2016  . Bronchitis 09/25/2016  . Asthmatic bronchitis 07/18/2016  . DM type 2 causing vascular disease ([place]) 07/18/2016  . GERD (gastroesophageal reflux disease) 07/18/2016  . Mixed hyperlipidemia 07/18/2016  . History of colonic polyps 06/12/2016    History obtained from: chart review and patient.  Jorge Mandril Lykins's Primary Care Provider is Asencion Noble, MD.     Breanah is a 62 y.o. female presenting for a follow up visit. She was seen for her first appointment in September 2018. At that time, she had a prolonged history of shortness of breath and wheezing including a seven-month history of steroid dependence. She had undergone two bronchoscopies with unenlightening workups. Secondary to her steroids, she was developing type 2 diabetes as well as cushingoid facies. Her previous history of asthma was very vague, and really only included one episode of bronchitis. We obtainedlab work to rule out serious causes of asthma including allergic bronchopulmonary aspergillosis, alpha-1 antitrypsin deficiency, and  hypersensitivity pneumonitis; all of these were normal. Aside from slightly elevated IgG toAspergillosis, her workup was entirely normal. She did have a history of an elevated absolute eosinophil count of 300, therefore we started her on Nucala at that visit. Her smoking history was rather vague, and  she reported smoking only socially. We started her onTrelegy1 puff once dailyin September 2018 and we added on Arnuity one puff in the evening to help bridge her transition off of prednisone; she is now no longer on the Arnuity. She did go to see Dr. Hortencia Pilar who was training under Dr. Rogue Bussing; they felt that her story was consistent with asthma. She then went to see Dr. Isaac Laud in Immunology at [facility], who felt comfortable with our current plan. She is followed by Dr. Dorris Fetch for her insulin dependent diabetes mellitus; she is on two injectable medications at this time for her diabetes.   Since the last visit, she got her IVIG on Thursday and then she started having symptoms on Saturday. She was mowing her grass and wearing her mask. It was was warm outside and then when she started getting through with the mowing. She has never had a problem with the itching after any of her other visits. She has spots on her anterior upper chest as well as her arms. She is itching under her neck and face. She is using cetirizine as well as topical Benadryl. She is taking 10mg  BID. She has not been on prednisone, but she does have some and is thinking of taking some.  She does not like to take prednisone because it messes up her blood glucose levels.  Of note, she is having no systemic reactions with the rash such as wheezing, swelling, or passing out.  She did have to go out of town last week and was not able to do the blood work, which she gets before every endocrinology appointment.  Therefore, she will reschedule her endocrinology appointment.  Her diabetes is been under excellent control.  She has fasting glucose in the 80-90 range.  She is on 2 injectable medications for her diabetes, which seems to be controlling her symptoms well.  She is very happy with Dr. Dorris Fetch.  We were planning to stop the immunoglobulin this summer anyway.  In the interim, she has had no infections.  Her last bout of infections  was in January, when she contracted the flu.  This is the last time she was on prednisone as well.  She has been weaned off of her prednisone taper since late 2018.   She continues to have SOB with ADLs but overall her clinical state is markedly improved. she is getting a new [place] monthly.  She remains on the Trelegy 1 puff once daily as well as Singulair 10 mg daily.  We did continue her on Arnuity 1 puff once daily as a bridge when her prednisone was being weaned, she has since stopped this.  She is very happy with how well she is doing.  She last saw Dr. Luan Pulling a few months ago.  Otherwise, there have been no changes to her past medical history, surgical history, family history, or social history.  She is currently on [employer] and she is still working on getting disability.     Review of Systems: a 14-point review of systems is pertinent for what is mentioned in HPI.  Otherwise, all other systems were negative. Constitutional: negative other than that listed in the HPI Eyes: negative other than  that listed in the HPI Ears, nose, mouth, throat, and face: negative other than that listed in the HPI Respiratory: negative other than that listed in the HPI Cardiovascular: negative other than that listed in the HPI Gastrointestinal: negative other than that listed in the HPI Genitourinary: negative other than that listed in the HPI Integument: negative other than that listed in the HPI Hematologic: negative other than that listed in the HPI Musculoskeletal: negative other than that listed in the HPI Neurological: negative other than that listed in the HPI Allergy/Immunologic: negative other than that listed in the HPI    Objective:   Temperature 97.7 F (36.5 C), temperature source Oral. There is no height or weight on file to calculate BMI.   Physical Exam:  General: Alert, interactive, in no acute distress. Pleasant female. Talkative and appreciative.  Eyes: No conjunctival  injection bilaterally, no discharge on the right, no discharge on the left and no Horner-Trantas dots present. PERRL bilaterally. EOMI without pain. No photophobia.  Ears: Right TM pearly gray with normal light reflex, Left TM pearly gray with normal light reflex, Right TM intact without perforation and Left TM intact without perforation.  Nose/Throat: External nose within normal limits and septum midline. Turbinates minimally edematous without discharge. Posterior oropharynx mildly erythematous without cobblestoning in the posterior oropharynx. Tonsils 2+ without exudates.  Tongue without thrush. Lungs: Clear to auscultation without wheezing, rhonchi or rales. No increased work of breathing. CV: Normal S1/S2. No murmurs. Capillary refill <2 seconds.  Skin: Warm and dry, without lesions or rashes. Neuro:   Grossly intact. No focal deficits appreciated. Responsive to questions.  Diagnostic studies:   Spirometry: results normal (FEV1: 1.87/92%, FVC: 2.31/84%, FEV1/FVC: 80%).    Spirometry consistent with normal pattern.   Allergy Studies: none      Salvatore Marvel, MD  [facility] [place] [facility] [place]

## 2017-09-30 NOTE — Patient Instructions (Addendum)
1. 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  - Daily controller medication(s): Trelegy one puff once in the morning + Singulair 10mg  daily + Nucala monthly - Rescue medications: ProAir 4 puffs every 4-6 hours as needed or DuoNeb nebulizer one vial every 4-6 hours as needed - Asthma control goals:  * Full participation in all desired activities (may need albuterol before activity) * Albuterol use two time or less a week on average (not counting use with activity) * Cough interfering with sleep two time or less a month * Oral steroids no more than once a year * No hospitalizations    2. Rash - possibly secondary to the IVIG - Increase cetirizine to 20mg  twice daily and continue for two weeks. - DepoMedrol 40mg  given today in clinic. - Start triamcinolone 0.1% ointment twice daily as needed to the worst areas.  - Hopefully this will resolve as more time goes on.   3. Chronic rhinitis - with negative blood allergen testing - Continue with Benadryl 25mg  once daily.   4. Hypogammaglobulinemia - We will stop the IVIG since we were planning to stop it in July anyway.  - We will change your follow up appointment to mid August so that we can check your immunoglobulin levels at that time.   5. Return in about 3 months (around 12/31/2017).   Please inform us of any Emergency Department visits, hospitalizations, or changes in symptoms. Call us before going to the ED for breathing or allergy symptoms since we might be able to fit you in for a sick visit. Feel free to contact us anytime with any questions, problems, or concerns.  It was a pleasure to see you again today!  Websites that have reliable patient information: 1. American Academy of Asthma, Allergy, and Immunology: www.aaaai.org 2. Food Allergy Research and Education (FARE): foodallergy.org 3. Mothers of Asthmatics: http://www.asthmacommunitynetwork.org 4. American College of  Allergy, Asthma, and Immunology: www.acaai.org

## 2017-10-03 NOTE — Addendum Note (Signed)
Addended by: Herbie Drape on: 10/03/2017 08:19 AM   Modules accepted: Orders

## 2017-10-04 DIAGNOSIS — J4550 Severe persistent asthma, uncomplicated: Secondary | ICD-10-CM | POA: Diagnosis not present

## 2017-10-07 ENCOUNTER — Other Ambulatory Visit: Payer: Self-pay | Admitting: "Endocrinology

## 2017-10-07 ENCOUNTER — Ambulatory Visit: Payer: 59 | Admitting: Nutrition

## 2017-10-14 ENCOUNTER — Ambulatory Visit (INDEPENDENT_AMBULATORY_CARE_PROVIDER_SITE_OTHER): Payer: 59 | Admitting: *Deleted

## 2017-10-14 DIAGNOSIS — J4550 Severe persistent asthma, uncomplicated: Secondary | ICD-10-CM

## 2017-10-29 ENCOUNTER — Other Ambulatory Visit: Payer: Self-pay | Admitting: Allergy & Immunology

## 2017-11-02 DIAGNOSIS — J4550 Severe persistent asthma, uncomplicated: Secondary | ICD-10-CM | POA: Diagnosis not present

## 2017-11-10 DIAGNOSIS — J4550 Severe persistent asthma, uncomplicated: Secondary | ICD-10-CM | POA: Diagnosis not present

## 2017-11-10 DIAGNOSIS — D839 Common variable immunodeficiency, unspecified: Secondary | ICD-10-CM | POA: Diagnosis not present

## 2017-11-10 DIAGNOSIS — I10 Essential (primary) hypertension: Secondary | ICD-10-CM | POA: Diagnosis not present

## 2017-11-11 ENCOUNTER — Ambulatory Visit (INDEPENDENT_AMBULATORY_CARE_PROVIDER_SITE_OTHER): Payer: 59 | Admitting: *Deleted

## 2017-11-11 DIAGNOSIS — J4550 Severe persistent asthma, uncomplicated: Secondary | ICD-10-CM | POA: Diagnosis not present

## 2017-11-12 DIAGNOSIS — N200 Calculus of kidney: Secondary | ICD-10-CM | POA: Diagnosis not present

## 2017-11-12 DIAGNOSIS — I10 Essential (primary) hypertension: Secondary | ICD-10-CM | POA: Diagnosis not present

## 2017-11-12 DIAGNOSIS — R82994 Hypercalciuria: Secondary | ICD-10-CM | POA: Diagnosis not present

## 2017-11-17 DIAGNOSIS — R82994 Hypercalciuria: Secondary | ICD-10-CM | POA: Diagnosis not present

## 2017-11-19 DIAGNOSIS — R82994 Hypercalciuria: Secondary | ICD-10-CM | POA: Diagnosis not present

## 2017-11-28 DIAGNOSIS — R300 Dysuria: Secondary | ICD-10-CM | POA: Diagnosis not present

## 2017-11-28 DIAGNOSIS — R109 Unspecified abdominal pain: Secondary | ICD-10-CM | POA: Diagnosis not present

## 2017-11-28 DIAGNOSIS — N200 Calculus of kidney: Secondary | ICD-10-CM | POA: Diagnosis not present

## 2017-11-29 DIAGNOSIS — J4550 Severe persistent asthma, uncomplicated: Secondary | ICD-10-CM | POA: Diagnosis not present

## 2017-12-02 ENCOUNTER — Ambulatory Visit (INDEPENDENT_AMBULATORY_CARE_PROVIDER_SITE_OTHER): Payer: 59 | Admitting: Allergy & Immunology

## 2017-12-02 ENCOUNTER — Encounter: Payer: Self-pay | Admitting: Allergy & Immunology

## 2017-12-02 VITALS — BP 140/80 | HR 78 | Temp 97.5°F | Resp 18 | Ht 67.0 in | Wt 161.4 lb

## 2017-12-02 DIAGNOSIS — J4550 Severe persistent asthma, uncomplicated: Secondary | ICD-10-CM | POA: Diagnosis not present

## 2017-12-02 DIAGNOSIS — D801 Nonfamilial hypogammaglobulinemia: Secondary | ICD-10-CM

## 2017-12-02 DIAGNOSIS — J310 Chronic rhinitis: Secondary | ICD-10-CM

## 2017-12-02 MED ORDER — FLUTICASONE-UMECLIDIN-VILANT 100-62.5-25 MCG/INH IN AEPB: 1.0000 | INHALATION_SPRAY | Freq: Every day | RESPIRATORY_TRACT | 5 refills | Status: DC

## 2017-12-02 MED ORDER — AMOXICILLIN-POT CLAVULANATE 875-125 MG PO TABS: 1.0000 | ORAL_TABLET | Freq: Two times a day (BID) | ORAL | 0 refills | Status: AC

## 2017-12-02 NOTE — Progress Notes (Signed)
FOLLOW UP  Date of Service/Encounter:  12/02/17   Assessment:   Severe persistent asthmawithCOPD overlap - followed by Dr. Luan Pulling  Non-allergic rhinitis  Recurrent infectionswith hypogammaglobulinemia - stopped IVIG May 2019 due to urticarial rash during administration  Chronic steroid use x 7 months (Feb 2018 -December 2018)- now off prednisone  Multiple lung nodules in the right lung  Insulin dependent diabetes -followed byDr. Dorris Fetch  GERD - on pantoprazole   Asthma Reportables: Severity:severe persistent Risk:high Control:well controlled   Kathy Tran is doing much better with regards to her pulmonary status.  Her lung function remains in the normal range.  She is on Nucala monthly as well as her triple therapy with Trelegy 1 puff once daily.  She has a history of chronic prednisone use for upwards of 7 months, but has been off prednisone for approximately 7 months at this point.  She has insulin-dependent diabetes which seems to be under good control.  Thankfully, she is followed by a fantastic endocrinologist.  She was on immunoglobulin replacement due to hypogammaglobulinemia.  I anticipate that this was from all of her prednisone use, but she has been off immunoglobulin for 2 months now.  We will plan to get an IgG level in August 2019 after she has been off for 3 months.  This should give [place] a good idea of her new baseline IgG level.    She does at this time have a 3 to 4-day history of worsening sore throat and left-sided ear pain.  She does have some minimal sinus tenderness.  Given her tenuous situation, we will provide a prescription for Augmentin twice daily for 10 days in case this gets worse.  I did recommend that she use Mucinex as well.  Plan/Recommendations:   1. 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 (on Nucala)  - Lung function looked great today and has only improved since the last visit - Daily controller medication(s):  Trelegy one puff once in the morning + Singulair 10mg  daily + Nucala monthly - Rescue medications: ProAir 4 puffs every 4-6 hours as needed or DuoNeb nebulizer one vial every 4-6 hours as needed - Asthma control goals:  * Full participation in all desired activities (may need albuterol before activity) * Albuterol use two time or less a week on average (not counting use with activity) * Cough interfering with sleep two time or less a month * Oral steroids no more than once a year * No hospitalizations    2. Chronic rhinitis - with negative blood allergen testing - Continue with Zyrtec (cetirizine) 10 mg once daily.   4. Hypogammaglobulinemia - stopped IVIG  - We will get a repeat IgG level (get this done on or after August 16th), which will be three months since your last infusion.  - With your current symptoms and time course, antibiotics might be needed: Augmentin 875mg  twice daily for 10 days - Add on nasal saline spray (i.e., Simply Saline) or nasal saline lavage (i.e., NeilMed) as needed prior to medicated nasal sprays. - For thick post nasal drainage, add guaifenesin [phone] mg (Mucinex) twice daily as needed for mucous thinning with adequate hydration to help it work.   5. Return in about 3 months (around 03/04/2018).  Subjective:   Kathy Tran is a 62 y.o. female presenting today for follow up of  Chief Complaint  Patient presents with  . Follow-up  . Sore Throat  . Ear Pain    left ear  . Asthma  .  Nasal Congestion  . Cough    Kathy Tran has a history of the following: Patient Active Problem List   Diagnosis Date Noted  . Postmenopausal 07/02/2017  . Hypogammaglobulinemia ([place]) 04/29/2017  . Recurrent infections 04/29/2017  . Non-allergic rhinitis 04/29/2017  . Severe persistent asthma without complication 62/69/4854  . Essential hypertension, benign 03/20/2017  . Asthma exacerbation 12/26/2016  . Bronchitis 09/25/2016  . Asthmatic bronchitis 07/18/2016  .  DM type 2 causing vascular disease ([place]) 07/18/2016  . GERD (gastroesophageal reflux disease) 07/18/2016  . Mixed hyperlipidemia 07/18/2016  . History of colonic polyps 06/12/2016    History obtained from: chart review and patient .  Kathy Tran's Primary Care Provider is Asencion Noble, MD.     Kathy Tran is a 62 y.o. female presenting for a follow up visit. She was seen for her first appointment in September 2018. At that time, she had a prolonged history of shortness of breath and wheezing including a seven-month history of steroid dependence. She had undergonetwobronchoscopies with unenlightening workups. Secondary to her steroids, she was developing type 2 diabetes as well as cushingoid facies. Her previous history of asthma was very vague, and really only included one episode of bronchitis. We obtainedlab work to rule out serious causes of asthma including allergic bronchopulmonary aspergillosis, alpha-1 antitrypsin deficiency, and hypersensitivity pneumonitis; all of these were normal. Aside from slightly elevated IgG toAspergillosis, her workup was entirely normal. She did have a history of an elevated absolute eosinophil count of 300, therefore we started her on Nucala at that visit. Her smoking history was rather vague, and she reported smoking only socially. [place]arted her onTrelegy1 puff once dailyin September 2018and we added on Arnuity one puff in the evening to help bridge her transition off of prednisone; she is now no longer on the Arnuity.   She was last seen in May 2019.  At that time, we continued her on Nucala as well as Trelegy 1 puff daily.  We also continued Singulair 10 mg daily.  Her asthma was actually under fairly good control and her lung function was finally normal.  Her rhinitis symptoms were controlled with an antihistamine once daily.  At the last visit, she had developed a rash after her latest IVIG infusion.  She determined that she wanted to stop her IVIG because of  this.  We will planning to stop in the middle of the summer anyway to see if she could hold her IgG levels without supplementation, so we decided to stop it in May instead.  Since the last visit, she has mostly done fairly well.  She remains on the Trelegy, which she likes.  She has been getting her monthly Nucala injections without adverse event.  Her albuterol use has remained minimal.  She was using it 1-2 times a day at the last visit, but now uses it 3-4 times per week at the most.  She feels that she is slowly getting better from a respiratory perspective.  She has not needed ER visits or prednisone since last visit.  She denies nighttime coughing.  She will be seeing Dr. Luan Pulling in early September for her next follow-up appointment.  She went to [place] last week and developed some problems with hoarseness and sore throat. She also reports ear pain.  She has had no fever, but does report some sinus pain.  This is more prominent on the left.  She is worried about this turning into something more serious.  She has not started Mucinex.  She remains on an antihistamine once daily.  She is not on a nasal spray.  She was on Benadryl at one point, but evidently now she is taking cetirizine 10 mg daily.  Diabetes remains well controlled.  She continues to follow with Dr. Dorris Fetch.  Otherwise, there have been no changes to her past medical history, surgical history, family history, or social history.    Review of Systems: a 14-point review of systems is pertinent for what is mentioned in HPI.  Otherwise, all other systems were negative. Constitutional: negative other than that listed in the HPI Eyes: negative other than that listed in the HPI Ears, nose, mouth, throat, and face: negative other than that listed in the HPI Respiratory: negative other than that listed in the HPI Cardiovascular: negative other than that listed in the HPI Gastrointestinal: negative other than that listed in the  HPI Genitourinary: negative other than that listed in the HPI Integument: negative other than that listed in the HPI Hematologic: negative other than that listed in the HPI Musculoskeletal: negative other than that listed in the HPI Neurological: negative other than that listed in the HPI Allergy/Immunologic: negative other than that listed in the HPI    Objective:   Blood pressure 140/80, pulse 78, temperature (!) 97.5 F (36.4 C), temperature source Oral, resp. rate 18, height 5\' 7"  (1.702 m), weight 161 lb 6.4 oz (73.2 kg), SpO2 98 %. Body mass index is 25.28 kg/m.   Physical Exam:  General: Alert, interactive, in no acute distress. Pleasant appreciative female.  Eyes: No conjunctival injection bilaterally, no discharge on the right, no discharge on the left and no Horner-Trantas dots present. PERRL bilaterally. EOMI without pain. No photophobia.  Ears: Right TM pearly gray with normal light reflex, Left TM pearly gray with normal light reflex, Right TM intact without perforation and Left TM intact without perforation.  Nose/Throat: External nose within normal limits, nasal crease present and septum midline. Turbinates edematous with clear discharge. Posterior oropharynx erythematous without cobblestoning in the posterior oropharynx. Tonsils 2+ without exudates.  Tongue without thrush. Lungs: Clear to auscultation without wheezing, rhonchi or rales. No increased work of breathing. CV: Normal S1/S2. No murmurs. Capillary refill <2 seconds.  Skin: Warm and dry, without lesions or rashes. Neuro:   Grossly intact. No focal deficits appreciated. Responsive to questions.  Diagnostic studies:   Spirometry: results normal (FEV1: 1.89/113%, FVC: 2.33/101%, FEV1/FVC: 80%).    Spirometry consistent with normal pattern. It should be noted that her height today in her spiro was 4 inches lower than her last height, but even the values themselves have improved since the last visit (FEV increased  from 1.87 to 1.89 today and the FVC increased from 2.31 to 2.33 today).   Allergy Studies: none       Salvatore Marvel, MD  [facility]

## 2017-12-02 NOTE — Patient Instructions (Addendum)
1. 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 (on Nucala)  - Lung function looked great today and has only improved since the last visit - Daily controller medication(s): Trelegy one puff once in the morning + Singulair 10mg  daily + Nucala monthly - Rescue medications: ProAir 4 puffs every 4-6 hours as needed or DuoNeb nebulizer one vial every 4-6 hours as needed - Asthma control goals:  * Full participation in all desired activities (may need albuterol before activity) * Albuterol use two time or less a week on average (not counting use with activity) * Cough interfering with sleep two time or less a month * Oral steroids no more than once a year * No hospitalizations    2. Chronic rhinitis - with negative blood allergen testing - Continue with Zyrtec (cetirizine) 10 mg once daily.   4. Hypogammaglobulinemia - stopped IVIG  - We will get a repeat IgG level (get this done on or after August 16th), which will be three months since your last infusion.  - With your current symptoms and time course, antibiotics might be needed: Augmentin 875mg  twice daily for 10 days - Add on nasal saline spray (i.e., Simply Saline) or nasal saline lavage (i.e., NeilMed) as needed prior to medicated nasal sprays. - For thick post nasal drainage, add guaifenesin [phone] mg (Mucinex) twice daily as needed for mucous thinning with adequate hydration to help it work.   5. Return in about 3 months (around 03/04/2018).   Please inform us of any Emergency Department visits, hospitalizations, or changes in symptoms. Call us before going to the ED for breathing or allergy symptoms since we might be able to fit you in for a sick visit. Feel free to contact us anytime with any questions, problems, or concerns.  It was a pleasure to see you again today!  Websites that have reliable patient information: 1. American Academy of Asthma, Allergy, and Immunology: www.aaaai.org 2. Food Allergy Research and  Education (FARE): foodallergy.org 3. Mothers of Asthmatics: http://www.asthmacommunitynetwork.org 4. American College of Allergy, Asthma, and Immunology: www.acaai.org

## 2017-12-09 ENCOUNTER — Ambulatory Visit (INDEPENDENT_AMBULATORY_CARE_PROVIDER_SITE_OTHER): Payer: 59

## 2017-12-09 DIAGNOSIS — J4550 Severe persistent asthma, uncomplicated: Secondary | ICD-10-CM

## 2017-12-12 DIAGNOSIS — R300 Dysuria: Secondary | ICD-10-CM | POA: Diagnosis not present

## 2017-12-12 DIAGNOSIS — N200 Calculus of kidney: Secondary | ICD-10-CM | POA: Diagnosis not present

## 2017-12-16 DIAGNOSIS — K219 Gastro-esophageal reflux disease without esophagitis: Secondary | ICD-10-CM | POA: Diagnosis not present

## 2017-12-16 DIAGNOSIS — E119 Type 2 diabetes mellitus without complications: Secondary | ICD-10-CM | POA: Diagnosis not present

## 2017-12-16 DIAGNOSIS — I10 Essential (primary) hypertension: Secondary | ICD-10-CM | POA: Diagnosis not present

## 2017-12-16 DIAGNOSIS — Z79899 Other long term (current) drug therapy: Secondary | ICD-10-CM | POA: Diagnosis not present

## 2017-12-23 DIAGNOSIS — J45901 Unspecified asthma with (acute) exacerbation: Secondary | ICD-10-CM | POA: Diagnosis not present

## 2017-12-23 DIAGNOSIS — E1169 Type 2 diabetes mellitus with other specified complication: Secondary | ICD-10-CM | POA: Diagnosis not present

## 2017-12-23 DIAGNOSIS — I10 Essential (primary) hypertension: Secondary | ICD-10-CM | POA: Diagnosis not present

## 2017-12-23 DIAGNOSIS — Z0001 Encounter for general adult medical examination with abnormal findings: Secondary | ICD-10-CM | POA: Diagnosis not present

## 2017-12-26 ENCOUNTER — Encounter (INDEPENDENT_AMBULATORY_CARE_PROVIDER_SITE_OTHER): Payer: Self-pay | Admitting: *Deleted

## 2017-12-29 DIAGNOSIS — H25812 Combined forms of age-related cataract, left eye: Secondary | ICD-10-CM | POA: Diagnosis not present

## 2017-12-29 DIAGNOSIS — J4550 Severe persistent asthma, uncomplicated: Secondary | ICD-10-CM | POA: Diagnosis not present

## 2017-12-29 DIAGNOSIS — H25813 Combined forms of age-related cataract, bilateral: Secondary | ICD-10-CM | POA: Diagnosis not present

## 2018-01-02 DIAGNOSIS — D801 Nonfamilial hypogammaglobulinemia: Secondary | ICD-10-CM | POA: Diagnosis not present

## 2018-01-03 LAB — IGG, IGA, IGM
IGA/IMMUNOGLOBULIN A, SERUM: 79 mg/dL — AB (ref 87–352)
IGM (IMMUNOGLOBULIN M), SRM: 52 mg/dL (ref 26–217)
IgG (Immunoglobin G), Serum: 607 mg/dL — ABNORMAL LOW (ref 700–1600)

## 2018-01-06 ENCOUNTER — Ambulatory Visit (INDEPENDENT_AMBULATORY_CARE_PROVIDER_SITE_OTHER): Payer: 59 | Admitting: *Deleted

## 2018-01-06 DIAGNOSIS — J4550 Severe persistent asthma, uncomplicated: Secondary | ICD-10-CM

## 2018-01-07 ENCOUNTER — Encounter (INDEPENDENT_AMBULATORY_CARE_PROVIDER_SITE_OTHER): Payer: Self-pay | Admitting: *Deleted

## 2018-01-07 ENCOUNTER — Other Ambulatory Visit (INDEPENDENT_AMBULATORY_CARE_PROVIDER_SITE_OTHER): Payer: Self-pay | Admitting: *Deleted

## 2018-01-07 DIAGNOSIS — Z860100 Personal history of colon polyps, unspecified: Secondary | ICD-10-CM | POA: Insufficient documentation

## 2018-01-09 NOTE — Patient Instructions (Signed)
Your procedure is scheduled on: 01/19/2018  Report to [facility] at   90    AM.  Call this number if you have problems the morning of surgery: [phone]   Do not eat food or drink liquids :After Midnight.      Take these medicines the morning of surgery with A SIP OF WATER: carvedilol, mobic, pantoprazole. Use your nebulizer and your inhaler before you come. Only take 20 units of your Tresiba the night before your surgery. DO NOT take any medications for diabetes the morning of your surgery.   Do not wear jewelry, make-up or nail polish.  Do not wear lotions, powders, or perfumes. You may wear deodorant.  Do not shave 48 hours prior to surgery.  Do not bring valuables to the hospital.  Contacts, dentures or bridgework may not be worn into surgery.  Leave suitcase in the car. After surgery it may be brought to your room.  For patients admitted to the hospital, checkout time is 11:00 AM the day of discharge.   Patients discharged the day of surgery will not be allowed to drive home.  :     Please read over the following fact sheets that you were given: Coughing and Deep Breathing, Surgical Site Infection Prevention, Anesthesia Post-op Instructions and Care and Recovery After Surgery    Cataract A cataract is a clouding of the lens of the eye. When a lens becomes cloudy, vision is reduced based on the degree and nature of the clouding. Many cataracts reduce vision to some degree. Some cataracts make people more near-sighted as they develop. Other cataracts increase glare. Cataracts that are ignored and become worse can sometimes look white. The white color can be seen through the pupil. CAUSES   Aging. However, cataracts may occur at any age, even in newborns.   Certain drugs.   Trauma to the eye.   Certain diseases such as diabetes.   Specific eye diseases such as chronic inflammation inside the eye or a sudden attack of a rare form of glaucoma.   Inherited or acquired medical  problems.  SYMPTOMS   Gradual, progressive drop in vision in the affected eye.   Severe, rapid visual loss. This most often happens when trauma is the cause.  DIAGNOSIS  To detect a cataract, an eye doctor examines the lens. Cataracts are best diagnosed with an exam of the eyes with the pupils enlarged (dilated) by drops.  TREATMENT  For an early cataract, vision may improve by using different eyeglasses or stronger lighting. If that does not help your vision, surgery is the only effective treatment. A cataract needs to be surgically removed when vision loss interferes with your everyday activities, such as driving, reading, or watching TV. A cataract may also have to be removed if it prevents examination or treatment of another eye problem. Surgery removes the cloudy lens and usually replaces it with a substitute lens (intraocular lens, IOL).  At a time when both you and your doctor agree, the cataract will be surgically removed. If you have cataracts in both eyes, only one is usually removed at a time. This allows the operated eye to heal and be out of danger from any possible problems after surgery (such as infection or poor wound healing). In rare cases, a cataract may be doing damage to your eye. In these cases, your caregiver may advise surgical removal right away. The vast majority of people who have cataract surgery have better vision afterward. HOME CARE INSTRUCTIONS  If you are not planning surgery, you may be asked to do the following:  Use different eyeglasses.   Use stronger or brighter lighting.   Ask your eye doctor about reducing your medicine dose or changing medicines if it is thought that a medicine caused your cataract. Changing medicines does not make the cataract go away on its own.   Become familiar with your surroundings. Poor vision can lead to injury. Avoid bumping into things on the affected side. You are at a higher risk for tripping or falling.   Exercise extreme  care when driving or operating machinery.   Wear sunglasses if you are sensitive to bright light or experiencing problems with glare.  SEEK IMMEDIATE MEDICAL CARE IF:   You have a worsening or sudden vision loss.   You notice redness, swelling, or increasing pain in the eye.   You have a fever.  Document Released: 04/29/2005 Document Revised: 04/18/2011 Document Reviewed: 12/21/2010 [facility] Patient Information 2012 [place].PATIENT INSTRUCTIONS POST-ANESTHESIA  IMMEDIATELY FOLLOWING SURGERY:  Do not drive or operate machinery for the first twenty four hours after surgery.  Do not make any important decisions for twenty four hours after surgery or while taking narcotic pain medications or sedatives.  If you develop intractable nausea and vomiting or a severe headache please notify your doctor immediately.  FOLLOW-UP:  Please make an appointment with your surgeon as instructed. You do not need to follow up with anesthesia unless specifically instructed to do so.  WOUND CARE INSTRUCTIONS (if applicable):  Keep a dry clean dressing on the anesthesia/puncture wound site if there is drainage.  Once the wound has quit draining you may leave it open to air.  Generally you should leave the bandage intact for twenty four hours unless there is drainage.  If the epidural site drains for more than 36-48 hours please call the anesthesia department.  QUESTIONS?:  Please feel free to call your physician or the hospital operator if you have any questions, and they will be happy to assist you.

## 2018-01-14 ENCOUNTER — Encounter (HOSPITAL_COMMUNITY): Payer: Self-pay

## 2018-01-14 ENCOUNTER — Encounter (HOSPITAL_COMMUNITY)
Admission: RE | Admit: 2018-01-14 | Discharge: 2018-01-14 | Disposition: A | Discharge status: Home / Self Care | Payer: 59 | Source: Ambulatory Visit | Attending: Ophthalmology | Admitting: Ophthalmology

## 2018-01-14 ENCOUNTER — Other Ambulatory Visit: Payer: Self-pay

## 2018-01-14 DIAGNOSIS — D839 Common variable immunodeficiency, unspecified: Secondary | ICD-10-CM | POA: Diagnosis not present

## 2018-01-14 DIAGNOSIS — Z01810 Encounter for preprocedural cardiovascular examination: Secondary | ICD-10-CM | POA: Insufficient documentation

## 2018-01-14 DIAGNOSIS — Z01812 Encounter for preprocedural laboratory examination: Secondary | ICD-10-CM | POA: Diagnosis not present

## 2018-01-14 DIAGNOSIS — I10 Essential (primary) hypertension: Secondary | ICD-10-CM | POA: Insufficient documentation

## 2018-01-14 DIAGNOSIS — H269 Unspecified cataract: Secondary | ICD-10-CM | POA: Insufficient documentation

## 2018-01-14 DIAGNOSIS — H5462 Unqualified visual loss, left eye, normal vision right eye: Secondary | ICD-10-CM | POA: Diagnosis not present

## 2018-01-14 DIAGNOSIS — J4550 Severe persistent asthma, uncomplicated: Secondary | ICD-10-CM | POA: Diagnosis not present

## 2018-01-14 HISTORY — DX: Chronic obstructive pulmonary disease, unspecified: J44.9

## 2018-01-14 LAB — BASIC METABOLIC PANEL
Anion gap: 9 (ref 5–15)
BUN: 17 mg/dL (ref 6–20)
CHLORIDE: 104 mmol/L (ref 98–111)
CO2: 27 mmol/L (ref 22–32)
CREATININE: 0.75 mg/dL (ref 0.44–1.00)
Calcium: 9.1 mg/dL (ref 8.9–10.3)
Glucose, Bld: 182 mg/dL — ABNORMAL HIGH (ref 70–99)
POTASSIUM: 3.9 mmol/L (ref 3.5–5.1)
SODIUM: 140 mmol/L (ref 135–145)

## 2018-01-14 LAB — CBC
HCT: 39.3 % (ref 36.0–46.0)
HEMOGLOBIN: 12.8 g/dL (ref 12.0–15.0)
MCH: 31.6 pg (ref 26.0–34.0)
MCHC: 32.6 g/dL (ref 30.0–36.0)
MCV: 97 fL (ref 78.0–100.0)
PLATELETS: 197 10*3/uL (ref 150–400)
RBC: 4.05 MIL/uL (ref 3.87–5.11)
RDW: 12.7 % (ref 11.5–15.5)
WBC: 4 10*3/uL (ref 4.0–10.5)

## 2018-01-14 LAB — HEMOGLOBIN A1C
Hgb A1c MFr Bld: 7.6 % — ABNORMAL HIGH (ref 4.8–5.6)
Mean Plasma Glucose: 171.42 mg/dL

## 2018-01-14 LAB — GLUCOSE, CAPILLARY: Glucose-Capillary: 187 mg/dL — ABNORMAL HIGH (ref 70–99)

## 2018-01-19 ENCOUNTER — Ambulatory Visit (HOSPITAL_COMMUNITY): Payer: 59 | Admitting: Anesthesiology

## 2018-01-19 ENCOUNTER — Encounter (HOSPITAL_COMMUNITY)
Admission: RE | Disposition: A | Discharge status: Home / Self Care | Payer: Self-pay | Source: Ambulatory Visit | Attending: Ophthalmology

## 2018-01-19 ENCOUNTER — Encounter (HOSPITAL_COMMUNITY): Payer: Self-pay | Admitting: Ophthalmology

## 2018-01-19 ENCOUNTER — Ambulatory Visit (HOSPITAL_COMMUNITY)
Admission: RE | Admit: 2018-01-19 | Discharge: 2018-01-19 | Disposition: A | Discharge status: Home / Self Care | Payer: 59 | Source: Ambulatory Visit | Attending: Ophthalmology | Admitting: Ophthalmology

## 2018-01-19 DIAGNOSIS — Z79899 Other long term (current) drug therapy: Secondary | ICD-10-CM | POA: Diagnosis not present

## 2018-01-19 DIAGNOSIS — Z794 Long term (current) use of insulin: Secondary | ICD-10-CM | POA: Diagnosis not present

## 2018-01-19 DIAGNOSIS — I10 Essential (primary) hypertension: Secondary | ICD-10-CM | POA: Diagnosis not present

## 2018-01-19 DIAGNOSIS — J449 Chronic obstructive pulmonary disease, unspecified: Secondary | ICD-10-CM | POA: Insufficient documentation

## 2018-01-19 DIAGNOSIS — Z87891 Personal history of nicotine dependence: Secondary | ICD-10-CM | POA: Insufficient documentation

## 2018-01-19 DIAGNOSIS — E7800 Pure hypercholesterolemia, unspecified: Secondary | ICD-10-CM | POA: Insufficient documentation

## 2018-01-19 DIAGNOSIS — L719 Rosacea, unspecified: Secondary | ICD-10-CM | POA: Insufficient documentation

## 2018-01-19 DIAGNOSIS — E119 Type 2 diabetes mellitus without complications: Secondary | ICD-10-CM | POA: Insufficient documentation

## 2018-01-19 DIAGNOSIS — H25812 Combined forms of age-related cataract, left eye: Secondary | ICD-10-CM | POA: Insufficient documentation

## 2018-01-19 DIAGNOSIS — K219 Gastro-esophageal reflux disease without esophagitis: Secondary | ICD-10-CM | POA: Diagnosis not present

## 2018-01-19 DIAGNOSIS — H2512 Age-related nuclear cataract, left eye: Secondary | ICD-10-CM | POA: Diagnosis not present

## 2018-01-19 HISTORY — PX: CATARACT EXTRACTION W/PHACO: SHX586

## 2018-01-19 LAB — GLUCOSE, CAPILLARY: Glucose-Capillary: 163 mg/dL — ABNORMAL HIGH (ref 70–99)

## 2018-01-19 SURGERY — PHACOEMULSIFICATION, CATARACT, WITH IOL INSERTION
Anesthesia: Monitor Anesthesia Care | Site: Eye | Laterality: Left

## 2018-01-19 MED ORDER — PROVISC 10 MG/ML IO SOLN
INTRAOCULAR | Status: DC | PRN
  Administered 2018-01-19: 0.85 mL via INTRAOCULAR

## 2018-01-19 MED ORDER — EPINEPHRINE PF 1 MG/ML IJ SOLN
INTRAOCULAR | Status: DC | PRN
  Administered 2018-01-19: 500 mL

## 2018-01-19 MED ORDER — EPINEPHRINE PF 1 MG/ML IJ SOLN
INTRAMUSCULAR | Status: AC
  Filled 2018-01-19: qty 1

## 2018-01-19 MED ORDER — PHENYLEPHRINE HCL 2.5 % OP SOLN
1.0000 [drp] | OPHTHALMIC | Status: AC
  Administered 2018-01-19 (×3): 1 [drp] via OPHTHALMIC

## 2018-01-19 MED ORDER — BSS IO SOLN
INTRAOCULAR | Status: DC | PRN
  Administered 2018-01-19: 15 mL via INTRAOCULAR

## 2018-01-19 MED ORDER — TETRACAINE HCL 0.5 % OP SOLN
1.0000 [drp] | OPHTHALMIC | Status: AC
  Administered 2018-01-19 (×3): 1 [drp] via OPHTHALMIC

## 2018-01-19 MED ORDER — LIDOCAINE HCL (PF) 1 % IJ SOLN
INTRAMUSCULAR | Status: DC | PRN
  Administered 2018-01-19: .4 mL

## 2018-01-19 MED ORDER — LACTATED RINGERS IV SOLN
INTRAVENOUS | Status: DC
  Administered 2018-01-19: 09:00:00 via INTRAVENOUS

## 2018-01-19 MED ORDER — MIDAZOLAM HCL 2 MG/2ML IJ SOLN
INTRAMUSCULAR | Status: DC | PRN
  Administered 2018-01-19: 2 mg via INTRAVENOUS

## 2018-01-19 MED ORDER — LIDOCAINE HCL 3.5 % OP GEL
1.0000 "application " | Freq: Once | OPHTHALMIC | Status: AC
  Administered 2018-01-19: 1 via OPHTHALMIC

## 2018-01-19 MED ORDER — NEOMYCIN-POLYMYXIN-DEXAMETH 3.5-10000-0.1 OP SUSP
OPHTHALMIC | Status: DC | PRN
  Administered 2018-01-19: 2 [drp] via OPHTHALMIC

## 2018-01-19 MED ORDER — POVIDONE-IODINE 5 % OP SOLN
OPHTHALMIC | Status: DC | PRN
  Administered 2018-01-19: 1 via OPHTHALMIC

## 2018-01-19 MED ORDER — CYCLOPENTOLATE-PHENYLEPHRINE 0.2-1 % OP SOLN
1.0000 [drp] | OPHTHALMIC | Status: AC
  Administered 2018-01-19 (×3): 1 [drp] via OPHTHALMIC

## 2018-01-19 MED ORDER — MIDAZOLAM HCL 2 MG/2ML IJ SOLN
INTRAMUSCULAR | Status: AC
  Filled 2018-01-19: qty 2

## 2018-01-19 SURGICAL SUPPLY — 14 items
CLOTH BEACON ORANGE TIMEOUT ST (SAFETY) ×1 IMPLANT
EYE SHIELD UNIVERSAL CLEAR (GAUZE/BANDAGES/DRESSINGS) ×1 IMPLANT
GLOVE BIOGEL PI IND STRL 6.5 (GLOVE) IMPLANT
GLOVE BIOGEL PI IND STRL 7.0 (GLOVE) IMPLANT
GLOVE BIOGEL PI INDICATOR 6.5 (GLOVE) ×1
GLOVE BIOGEL PI INDICATOR 7.0 (GLOVE) ×2
GOWN STRL REUS W/ TWL LRG LVL3 (GOWN DISPOSABLE) IMPLANT
GOWN STRL REUS W/TWL LRG LVL3 (GOWN DISPOSABLE) ×2
LENS ALC ACRYL/TECN (Ophthalmic Related) ×1 IMPLANT
PAD ARMBOARD 7.5X6 YLW CONV (MISCELLANEOUS) ×1 IMPLANT
SYRINGE LUER LOK 1CC (MISCELLANEOUS) ×1 IMPLANT
TAPE SURG TRANSPORE 1 IN (GAUZE/BANDAGES/DRESSINGS) IMPLANT
TAPE SURGICAL TRANSPORE 1 IN (GAUZE/BANDAGES/DRESSINGS) ×1
WATER STERILE IRR 250ML POUR (IV SOLUTION) ×1 IMPLANT

## 2018-01-19 NOTE — Transfer of Care (Signed)
Immediate Anesthesia Transfer of Care Note  Patient: Kathy Tran  Procedure(s) Performed: CATARACT EXTRACTION PHACO AND INTRAOCULAR LENS PLACEMENT LEFT EYE (Left Eye)  Patient Location: Short Stay  Anesthesia Type:MAC  Level of Consciousness: awake, alert  and patient cooperative  Airway & Oxygen Therapy: Patient Spontanous Breathing  Post-op Assessment: Report given to RN and Post -op Vital signs reviewed and stable  Post vital signs: Reviewed and stable  Last Vitals:  Vitals Value Taken Time  BP    Temp    Pulse    Resp    SpO2      Last Pain:  Vitals:   01/19/18 0816  PainSc: 0-No pain         Complications: No apparent anesthesia complications

## 2018-01-19 NOTE — H&P (Signed)
I have reviewed the H&P, the patient was re-examined, and I have identified no interval changes in medical condition and plan of care since the history and physical of record  

## 2018-01-19 NOTE — Op Note (Signed)
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  Assistants: None  Anesthesia: Topical with MAC  Indications: Painless, progressive loss of vision with compromise of daily activities.  Surgery: Cataract Extraction with Intraocular lens Implant Left Eye  Discription: The patient had dilating drops and viscous lidocaine placed into the Left eye in the pre-op holding area. After transfer to the operating room, a time out was performed. The patient was then prepped and draped. Beginning with a 77m blade a paracentesis port was made at the surgeon's 2 o'clock position. The anterior chamber was then filled with 1% non-preserved lidocaine. This was followed by filling the anterior chamber with Provisc.  A 2.459mkeratome blade was used to make a clear corneal incision at the temporal limbus.  A bent cystatome needle was used to create a continuous tear capsulotomy. Hydrodissection was performed with balanced salt solution on a Fine canula. The lens nucleus was then removed using the phacoemulsification handpiece. Residual cortex was removed with the I&A handpiece. The anterior chamber and capsular bag were refilled with Provisc. A posterior chamber intraocular lens was placed into the capsular bag with it's injector. The implant was positioned with the Kuglan hook. The Provisc was then removed from the anterior chamber and capsular bag with the I&A handpiece. Stromal hydration of the main incision and paracentesis port was performed with BSS on a Fine canula. The wounds were tested for leak which was negative. The patient tolerated the procedure well. There were no operative complications. The patient was then transferred to the recovery room in stable condition.  Complications: None  Specimen: None  EBL: None  Prosthetic device: [employer]J Technis, PCB00, power 22.0, SN  [id number]

## 2018-01-19 NOTE — Anesthesia Postprocedure Evaluation (Signed)
Anesthesia Post Note  Patient: Kathy Tran  Procedure(s) Performed: CATARACT EXTRACTION PHACO AND INTRAOCULAR LENS PLACEMENT LEFT EYE (Left Eye)  Patient location during evaluation: Short Stay Anesthesia Type: MAC Level of consciousness: awake and alert and patient cooperative Pain management: pain level controlled Vital Signs Assessment: post-procedure vital signs reviewed and stable Respiratory status: spontaneous breathing Cardiovascular status: stable Postop Assessment: no apparent nausea or vomiting Anesthetic complications: no     Last Vitals:  Vitals:   01/19/18 0816  BP: 135/84  Pulse: 79  Resp: 18  Temp: 36.7 C  SpO2: 99%    Last Pain:  Vitals:   01/19/18 0816  PainSc: 0-No pain                 Icelyn Navarrete

## 2018-01-19 NOTE — Anesthesia Preprocedure Evaluation (Signed)
Anesthesia Evaluation  Patient identified by MRN, date of birth, ID band Patient awake    Reviewed: Allergy & Precautions, NPO status , Patient's Chart, lab work & pertinent test results, reviewed documented beta blocker date and time   Airway Mallampati: II  TM Distance: >3 FB Neck ROM: Full    Dental no notable dental hx. (+) Teeth Intact   Pulmonary neg pulmonary ROS, asthma , COPD,  COPD inhaler, Recent URI , Resolved,  Uses mult meds -clear today   Pulmonary exam normal breath sounds clear to auscultation       Cardiovascular Exercise Tolerance: Good Pt. on medications and Pt. on home beta blockers + Peripheral Vascular Disease  negative cardio ROS Normal cardiovascular examI Rhythm:Regular Rate:Normal     Neuro/Psych negative neurological ROS  negative psych ROS   GI/Hepatic negative GI ROS, Neg liver ROS, GERD  Medicated and Controlled,  Endo/Other  negative endocrine ROSdiabetes  Renal/GU Renal diseasenegative Renal ROS  negative genitourinary   Musculoskeletal negative musculoskeletal ROS (+)   Abdominal   Peds negative pediatric ROS (+)  Hematology negative hematology ROS (+)   Anesthesia Other Findings   Reproductive/Obstetrics negative OB ROS                             Anesthesia Physical Anesthesia Plan  ASA: II  Anesthesia Plan: MAC   Post-op Pain Management:    Induction:   PONV Risk Score and Plan:   Airway Management Planned: Nasal Cannula  Additional Equipment:   Intra-op Plan:   Post-operative Plan:   Informed Consent: I have reviewed the patients History and Physical, chart, labs and discussed the procedure including the risks, benefits and alternatives for the proposed anesthesia with the patient or authorized representative who has indicated his/her understanding and acceptance.   Dental advisory given  Plan Discussed with:   Anesthesia Plan  Comments:         Anesthesia Quick Evaluation

## 2018-01-19 NOTE — Anesthesia Procedure Notes (Signed)
Procedure Name: MAC Date/Time: 01/19/2018 8:47 AM Performed by: Vista Deck, CRNA Pre-anesthesia Checklist: Patient identified, Emergency Drugs available, Suction available, Timeout performed and Patient being monitored Patient Re-evaluated:Patient Re-evaluated prior to induction Oxygen Delivery Method: Nasal Cannula

## 2018-01-20 ENCOUNTER — Encounter (HOSPITAL_COMMUNITY): Payer: Self-pay | Admitting: Ophthalmology

## 2018-01-20 ENCOUNTER — Other Ambulatory Visit: Payer: Self-pay | Admitting: Allergy & Immunology

## 2018-01-26 DIAGNOSIS — J4550 Severe persistent asthma, uncomplicated: Secondary | ICD-10-CM | POA: Diagnosis not present

## 2018-01-26 DIAGNOSIS — H25811 Combined forms of age-related cataract, right eye: Secondary | ICD-10-CM | POA: Diagnosis not present

## 2018-01-26 DIAGNOSIS — J4551 Severe persistent asthma with (acute) exacerbation: Secondary | ICD-10-CM | POA: Diagnosis not present

## 2018-01-26 DIAGNOSIS — I10 Essential (primary) hypertension: Secondary | ICD-10-CM | POA: Diagnosis not present

## 2018-01-26 DIAGNOSIS — J40 Bronchitis, not specified as acute or chronic: Secondary | ICD-10-CM | POA: Diagnosis not present

## 2018-01-27 DIAGNOSIS — I10 Essential (primary) hypertension: Secondary | ICD-10-CM | POA: Diagnosis not present

## 2018-01-27 DIAGNOSIS — J40 Bronchitis, not specified as acute or chronic: Secondary | ICD-10-CM | POA: Diagnosis not present

## 2018-01-27 DIAGNOSIS — J4551 Severe persistent asthma with (acute) exacerbation: Secondary | ICD-10-CM | POA: Diagnosis not present

## 2018-01-28 ENCOUNTER — Other Ambulatory Visit: Payer: Self-pay

## 2018-01-28 ENCOUNTER — Inpatient Hospital Stay (HOSPITAL_COMMUNITY): Payer: 59

## 2018-01-28 ENCOUNTER — Inpatient Hospital Stay (HOSPITAL_COMMUNITY)
Admission: AD | Admit: 2018-01-28 | Discharge: 2018-02-01 | DRG: 202 | Disposition: A | Discharge status: Home / Self Care | Payer: 59 | Source: Ambulatory Visit | Attending: Pulmonary Disease | Admitting: Pulmonary Disease

## 2018-01-28 ENCOUNTER — Encounter (HOSPITAL_COMMUNITY): Payer: Self-pay

## 2018-01-28 DIAGNOSIS — J45909 Unspecified asthma, uncomplicated: Secondary | ICD-10-CM | POA: Diagnosis present

## 2018-01-28 DIAGNOSIS — D801 Nonfamilial hypogammaglobulinemia: Secondary | ICD-10-CM | POA: Diagnosis present

## 2018-01-28 DIAGNOSIS — Z7951 Long term (current) use of inhaled steroids: Secondary | ICD-10-CM

## 2018-01-28 DIAGNOSIS — D839 Common variable immunodeficiency, unspecified: Secondary | ICD-10-CM | POA: Diagnosis present

## 2018-01-28 DIAGNOSIS — Z882 Allergy status to sulfonamides status: Secondary | ICD-10-CM

## 2018-01-28 DIAGNOSIS — F419 Anxiety disorder, unspecified: Secondary | ICD-10-CM | POA: Diagnosis present

## 2018-01-28 DIAGNOSIS — J209 Acute bronchitis, unspecified: Secondary | ICD-10-CM | POA: Diagnosis present

## 2018-01-28 DIAGNOSIS — Z79899 Other long term (current) drug therapy: Secondary | ICD-10-CM

## 2018-01-28 DIAGNOSIS — I10 Essential (primary) hypertension: Secondary | ICD-10-CM | POA: Diagnosis present

## 2018-01-28 DIAGNOSIS — B370 Candidal stomatitis: Secondary | ICD-10-CM | POA: Diagnosis present

## 2018-01-28 DIAGNOSIS — Z791 Long term (current) use of non-steroidal anti-inflammatories (NSAID): Secondary | ICD-10-CM | POA: Diagnosis not present

## 2018-01-28 DIAGNOSIS — Z825 Family history of asthma and other chronic lower respiratory diseases: Secondary | ICD-10-CM

## 2018-01-28 DIAGNOSIS — E1159 Type 2 diabetes mellitus with other circulatory complications: Secondary | ICD-10-CM | POA: Diagnosis present

## 2018-01-28 DIAGNOSIS — Z9851 Tubal ligation status: Secondary | ICD-10-CM | POA: Diagnosis not present

## 2018-01-28 DIAGNOSIS — Z87442 Personal history of urinary calculi: Secondary | ICD-10-CM

## 2018-01-28 DIAGNOSIS — Z87892 Personal history of anaphylaxis: Secondary | ICD-10-CM | POA: Diagnosis not present

## 2018-01-28 DIAGNOSIS — E782 Mixed hyperlipidemia: Secondary | ICD-10-CM | POA: Diagnosis present

## 2018-01-28 DIAGNOSIS — J440 Chronic obstructive pulmonary disease with acute lower respiratory infection: Secondary | ICD-10-CM | POA: Diagnosis present

## 2018-01-28 DIAGNOSIS — K219 Gastro-esophageal reflux disease without esophagitis: Secondary | ICD-10-CM | POA: Diagnosis present

## 2018-01-28 DIAGNOSIS — R05 Cough: Secondary | ICD-10-CM | POA: Diagnosis not present

## 2018-01-28 DIAGNOSIS — Z7982 Long term (current) use of aspirin: Secondary | ICD-10-CM

## 2018-01-28 DIAGNOSIS — Z961 Presence of intraocular lens: Secondary | ICD-10-CM | POA: Diagnosis present

## 2018-01-28 DIAGNOSIS — J45901 Unspecified asthma with (acute) exacerbation: Secondary | ICD-10-CM | POA: Diagnosis present

## 2018-01-28 DIAGNOSIS — J4551 Severe persistent asthma with (acute) exacerbation: Principal | ICD-10-CM | POA: Diagnosis present

## 2018-01-28 DIAGNOSIS — Z9842 Cataract extraction status, left eye: Secondary | ICD-10-CM | POA: Diagnosis not present

## 2018-01-28 LAB — CBC WITH DIFFERENTIAL/PLATELET
BASOS PCT: 0 %
Basophils Absolute: 0 10*3/uL (ref 0.0–0.1)
EOS ABS: 0 10*3/uL (ref 0.0–0.7)
Eosinophils Relative: 0 %
HCT: 40.5 % (ref 36.0–46.0)
HEMOGLOBIN: 13.1 g/dL (ref 12.0–15.0)
Lymphocytes Relative: 18 %
Lymphs Abs: 0.8 10*3/uL (ref 0.7–4.0)
MCH: 30.9 pg (ref 26.0–34.0)
MCHC: 32.3 g/dL (ref 30.0–36.0)
MCV: 95.5 fL (ref 78.0–100.0)
MONOS PCT: 8 %
Monocytes Absolute: 0.3 10*3/uL (ref 0.1–1.0)
Neutro Abs: 3 10*3/uL (ref 1.7–7.7)
Neutrophils Relative %: 74 %
PLATELETS: 190 10*3/uL (ref 150–400)
RBC: 4.24 MIL/uL (ref 3.87–5.11)
RDW: 12.3 % (ref 11.5–15.5)
WBC: 4.1 10*3/uL (ref 4.0–10.5)

## 2018-01-28 LAB — COMPREHENSIVE METABOLIC PANEL
ALK PHOS: 63 U/L (ref 38–126)
ALT: 24 U/L (ref 0–44)
AST: 24 U/L (ref 15–41)
Albumin: 4.2 g/dL (ref 3.5–5.0)
Anion gap: 10 (ref 5–15)
BILIRUBIN TOTAL: 0.6 mg/dL (ref 0.3–1.2)
BUN: 15 mg/dL (ref 6–20)
CALCIUM: 9.4 mg/dL (ref 8.9–10.3)
CO2: 25 mmol/L (ref 22–32)
CREATININE: 0.67 mg/dL (ref 0.44–1.00)
Chloride: 104 mmol/L (ref 98–111)
Glucose, Bld: 162 mg/dL — ABNORMAL HIGH (ref 70–99)
Potassium: 4.1 mmol/L (ref 3.5–5.1)
Sodium: 139 mmol/L (ref 135–145)
Total Protein: 7.1 g/dL (ref 6.5–8.1)

## 2018-01-28 LAB — GLUCOSE, CAPILLARY
GLUCOSE-CAPILLARY: 161 mg/dL — AB (ref 70–99)
GLUCOSE-CAPILLARY: 311 mg/dL — AB (ref 70–99)
Glucose-Capillary: 243 mg/dL — ABNORMAL HIGH (ref 70–99)

## 2018-01-28 MED ORDER — INSULIN DEGLUDEC 100 UNIT/ML ~~LOC~~ SOPN: 40.0000 [IU] | PEN_INJECTOR | Freq: Every day | SUBCUTANEOUS | Status: DC

## 2018-01-28 MED ORDER — GUAIFENESIN ER 600 MG PO TB12
600.0000 mg | ORAL_TABLET | Freq: Two times a day (BID) | ORAL | Status: DC
  Administered 2018-01-28 – 2018-02-01 (×8): 600 mg via ORAL
  Filled 2018-01-28 (×9): qty 1

## 2018-01-28 MED ORDER — ONDANSETRON HCL 4 MG/2ML IJ SOLN: 4.0000 mg | Freq: Four times a day (QID) | INTRAMUSCULAR | Status: DC | PRN

## 2018-01-28 MED ORDER — SODIUM CHLORIDE 0.9% FLUSH: 3.0000 mL | Freq: Two times a day (BID) | INTRAVENOUS | Status: DC

## 2018-01-28 MED ORDER — SODIUM CHLORIDE 0.9% FLUSH: 3.0000 mL | INTRAVENOUS | Status: DC | PRN

## 2018-01-28 MED ORDER — LORAZEPAM 0.5 MG PO TABS
0.5000 mg | ORAL_TABLET | Freq: Every day | ORAL | Status: DC
  Administered 2018-01-28 – 2018-01-31 (×4): 0.5 mg via ORAL
  Filled 2018-01-28 (×4): qty 1

## 2018-01-28 MED ORDER — CARVEDILOL 12.5 MG PO TABS
25.0000 mg | ORAL_TABLET | Freq: Two times a day (BID) | ORAL | Status: DC
  Administered 2018-01-28 – 2018-02-01 (×8): 25 mg via ORAL
  Filled 2018-01-28 (×9): qty 2

## 2018-01-28 MED ORDER — PANTOPRAZOLE SODIUM 40 MG PO TBEC
40.0000 mg | DELAYED_RELEASE_TABLET | Freq: Every day | ORAL | Status: DC
  Administered 2018-01-29 – 2018-02-01 (×4): 40 mg via ORAL
  Filled 2018-01-28 (×5): qty 1

## 2018-01-28 MED ORDER — ASPIRIN EC 81 MG PO TBEC
81.0000 mg | DELAYED_RELEASE_TABLET | ORAL | Status: DC
  Administered 2018-01-28 – 2018-01-31 (×3): 81 mg via ORAL
  Filled 2018-01-28 (×5): qty 1

## 2018-01-28 MED ORDER — MONTELUKAST SODIUM 10 MG PO TABS
10.0000 mg | ORAL_TABLET | Freq: Every day | ORAL | Status: DC
  Administered 2018-01-28 – 2018-01-31 (×4): 10 mg via ORAL
  Filled 2018-01-28 (×4): qty 1

## 2018-01-28 MED ORDER — INSULIN GLARGINE 100 UNIT/ML ~~LOC~~ SOLN
30.0000 [IU] | Freq: Every day | SUBCUTANEOUS | Status: DC
  Administered 2018-01-28 – 2018-01-31 (×4): 30 [IU] via SUBCUTANEOUS
  Filled 2018-01-28 (×5): qty 0.3

## 2018-01-28 MED ORDER — SODIUM CHLORIDE 0.9% FLUSH
3.0000 mL | Freq: Two times a day (BID) | INTRAVENOUS | Status: DC
  Administered 2018-01-28 – 2018-02-01 (×7): 3 mL via INTRAVENOUS

## 2018-01-28 MED ORDER — INSULIN ASPART 100 UNIT/ML ~~LOC~~ SOLN
0.0000 [IU] | Freq: Three times a day (TID) | SUBCUTANEOUS | Status: DC
  Administered 2018-01-28: 4 [IU] via SUBCUTANEOUS
  Administered 2018-01-28: 7 [IU] via SUBCUTANEOUS
  Administered 2018-01-29 (×2): 15 [IU] via SUBCUTANEOUS
  Administered 2018-01-29: 11 [IU] via SUBCUTANEOUS
  Administered 2018-01-30: 7 [IU] via SUBCUTANEOUS
  Administered 2018-01-30: 20 [IU] via SUBCUTANEOUS
  Administered 2018-01-30: 15 [IU] via SUBCUTANEOUS
  Administered 2018-01-31: 20 [IU] via SUBCUTANEOUS
  Administered 2018-01-31: 7 [IU] via SUBCUTANEOUS
  Administered 2018-01-31: 11 [IU] via SUBCUTANEOUS

## 2018-01-28 MED ORDER — ALBUTEROL SULFATE (2.5 MG/3ML) 0.083% IN NEBU
2.5000 mg | INHALATION_SOLUTION | RESPIRATORY_TRACT | Status: DC
  Administered 2018-01-28 (×2): 2.5 mg via RESPIRATORY_TRACT
  Filled 2018-01-28 (×2): qty 3

## 2018-01-28 MED ORDER — HYDROCHLOROTHIAZIDE 25 MG PO TABS
25.0000 mg | ORAL_TABLET | Freq: Every day | ORAL | Status: DC
  Administered 2018-01-29 – 2018-02-01 (×4): 25 mg via ORAL
  Filled 2018-01-28 (×5): qty 1

## 2018-01-28 MED ORDER — LEVOFLOXACIN IN D5W 500 MG/100ML IV SOLN
500.0000 mg | INTRAVENOUS | Status: DC
  Administered 2018-01-28 – 2018-01-30 (×3): 500 mg via INTRAVENOUS
  Filled 2018-01-28 (×3): qty 100

## 2018-01-28 MED ORDER — METHYLPREDNISOLONE SODIUM SUCC 40 MG IJ SOLR
40.0000 mg | Freq: Four times a day (QID) | INTRAMUSCULAR | Status: DC
  Administered 2018-01-28 – 2018-01-31 (×12): 40 mg via INTRAVENOUS
  Filled 2018-01-28 (×12): qty 1

## 2018-01-28 MED ORDER — BROMFENAC SODIUM 0.07 % OP SOLN
1.0000 [drp] | Freq: Every day | OPHTHALMIC | Status: DC
  Administered 2018-01-29 – 2018-02-01 (×4): 1 [drp] via OPHTHALMIC
  Filled 2018-01-28: qty 1

## 2018-01-28 MED ORDER — ALBUTEROL SULFATE (2.5 MG/3ML) 0.083% IN NEBU: 3.0000 mL | INHALATION_SOLUTION | RESPIRATORY_TRACT | Status: DC | PRN

## 2018-01-28 MED ORDER — DIFLUPREDNATE 0.05 % OP EMUL
1.0000 [drp] | Freq: Three times a day (TID) | OPHTHALMIC | Status: DC
  Administered 2018-01-28 – 2018-02-01 (×12): 1 [drp] via OPHTHALMIC
  Filled 2018-01-28: qty 5

## 2018-01-28 MED ORDER — FLUTICASONE FUROATE-VILANTEROL 100-25 MCG/INH IN AEPB
1.0000 | INHALATION_SPRAY | Freq: Every day | RESPIRATORY_TRACT | Status: DC
  Administered 2018-01-29 – 2018-02-01 (×4): 1 via RESPIRATORY_TRACT
  Filled 2018-01-28: qty 28

## 2018-01-28 MED ORDER — FAMOTIDINE 20 MG PO TABS
20.0000 mg | ORAL_TABLET | Freq: Every day | ORAL | Status: DC
  Administered 2018-01-28 – 2018-01-31 (×4): 20 mg via ORAL
  Filled 2018-01-28 (×4): qty 1

## 2018-01-28 MED ORDER — INSULIN ASPART 100 UNIT/ML ~~LOC~~ SOLN
0.0000 [IU] | Freq: Every day | SUBCUTANEOUS | Status: DC
  Administered 2018-01-28 – 2018-01-29 (×2): 4 [IU] via SUBCUTANEOUS
  Administered 2018-01-30: 3 [IU] via SUBCUTANEOUS

## 2018-01-28 MED ORDER — IPRATROPIUM-ALBUTEROL 0.5-2.5 (3) MG/3ML IN SOLN
3.0000 mL | RESPIRATORY_TRACT | Status: DC
  Administered 2018-01-28 – 2018-02-01 (×16): 3 mL via RESPIRATORY_TRACT
  Filled 2018-01-28 (×17): qty 3

## 2018-01-28 MED ORDER — ONDANSETRON HCL 4 MG PO TABS: 4.0000 mg | ORAL_TABLET | Freq: Four times a day (QID) | ORAL | Status: DC | PRN

## 2018-01-28 MED ORDER — UMECLIDINIUM BROMIDE 62.5 MCG/INH IN AEPB
1.0000 | INHALATION_SPRAY | Freq: Every day | RESPIRATORY_TRACT | Status: DC
  Administered 2018-01-29 – 2018-02-01 (×4): 1 via RESPIRATORY_TRACT
  Filled 2018-01-28: qty 7

## 2018-01-28 MED ORDER — ENOXAPARIN SODIUM 40 MG/0.4ML ~~LOC~~ SOLN
40.0000 mg | SUBCUTANEOUS | Status: DC
  Administered 2018-01-28 – 2018-01-31 (×4): 40 mg via SUBCUTANEOUS
  Filled 2018-01-28 (×4): qty 0.4

## 2018-01-28 MED ORDER — SODIUM CHLORIDE 0.9 % IV SOLN: 250.0000 mL | INTRAVENOUS | Status: DC | PRN

## 2018-01-28 MED ORDER — PRAVASTATIN SODIUM 10 MG PO TABS
10.0000 mg | ORAL_TABLET | Freq: Every day | ORAL | Status: DC
  Administered 2018-01-28 – 2018-01-31 (×4): 10 mg via ORAL
  Filled 2018-01-28 (×4): qty 1

## 2018-01-28 MED ORDER — POLYVINYL ALCOHOL 1.4 % OP SOLN: 1.0000 [drp] | Freq: Three times a day (TID) | OPHTHALMIC | Status: DC | PRN

## 2018-01-28 MED ORDER — FLUTICASONE-UMECLIDIN-VILANT 100-62.5-25 MCG/INH IN AEPB: 1.0000 | INHALATION_SPRAY | Freq: Every day | RESPIRATORY_TRACT | Status: DC

## 2018-01-28 NOTE — H&P (Signed)
Full note to follow. Admitted from my ofifce with severe shortness of breath not responsive to outpatient therapy.

## 2018-01-29 LAB — BASIC METABOLIC PANEL
Anion gap: 10 (ref 5–15)
BUN: 16 mg/dL (ref 6–20)
CHLORIDE: 100 mmol/L (ref 98–111)
CO2: 27 mmol/L (ref 22–32)
Calcium: 9.6 mg/dL (ref 8.9–10.3)
Creatinine, Ser: 0.62 mg/dL (ref 0.44–1.00)
GFR calc Af Amer: >60 mL/min (ref >60)
GFR calc non Af Amer: >60 mL/min (ref >60)
Glucose, Bld: 294 mg/dL — ABNORMAL HIGH (ref 70–99)
Potassium: 3.5 mmol/L (ref 3.5–5.1)
SODIUM: 137 mmol/L (ref 135–145)

## 2018-01-29 LAB — GLUCOSE, CAPILLARY
GLUCOSE-CAPILLARY: 287 mg/dL — AB (ref 70–99)
GLUCOSE-CAPILLARY: 325 mg/dL — AB (ref 70–99)
GLUCOSE-CAPILLARY: 306 mg/dL — AB (ref 70–99)
Glucose-Capillary: 321 mg/dL — ABNORMAL HIGH (ref 70–99)

## 2018-01-29 LAB — IGG, IGA, IGM
IGA: 91 mg/dL (ref 87–352)
IGM (IMMUNOGLOBULIN M), SRM: 54 mg/dL (ref 26–217)
IgG (Immunoglobin G), Serum: 638 mg/dL — ABNORMAL LOW (ref 700–1600)

## 2018-01-29 LAB — HIV ANTIBODY (ROUTINE TESTING W REFLEX): HIV Screen 4th Generation wRfx: NONREACTIVE

## 2018-01-29 MED ORDER — FLUCONAZOLE 100 MG PO TABS: 200.0000 mg | ORAL_TABLET | Freq: Once | ORAL | Status: DC

## 2018-01-29 MED ORDER — IMMUNE GLOBULIN (HUMAN) 20 GM/200ML IV SOLN
400.0000 mg/kg | INTRAVENOUS | Status: AC
  Administered 2018-01-29 – 2018-01-30 (×2): 30 g via INTRAVENOUS
  Filled 2018-01-29 (×3): qty 100

## 2018-01-29 MED ORDER — FLUCONAZOLE 100 MG PO TABS
100.0000 mg | ORAL_TABLET | Freq: Every day | ORAL | Status: DC
  Administered 2018-01-30 – 2018-02-01 (×3): 100 mg via ORAL
  Filled 2018-01-29 (×3): qty 1

## 2018-01-29 MED ORDER — POTASSIUM CHLORIDE CRYS ER 20 MEQ PO TBCR
40.0000 meq | EXTENDED_RELEASE_TABLET | Freq: Once | ORAL | Status: AC
  Administered 2018-01-29: 40 meq via ORAL
  Filled 2018-01-29: qty 2

## 2018-01-29 MED ORDER — IMMUNE GLOBULIN (HUMAN) 5 GM/50ML IV SOLN: 2.0000 g/kg | INTRAVENOUS | Status: DC

## 2018-01-29 MED ORDER — FLUCONAZOLE 100 MG PO TABS
200.0000 mg | ORAL_TABLET | Freq: Once | ORAL | Status: AC
  Administered 2018-01-29: 200 mg via ORAL
  Filled 2018-01-29: qty 2

## 2018-01-29 NOTE — Progress Notes (Signed)
Received verbal order from Dr. Luan Pulling to oral PO. Diflucan 200 mg once, and for P.O. Diflucan 100 mg daily.

## 2018-01-29 NOTE — Progress Notes (Signed)
Subjective: She says she feels better but not well.  She still very short of breath.  She is coughing nonproductively.  Objective: Vital signs in last 24 hours: Temp:  [98 F (36.7 C)-98.3 F (36.8 C)] 98 F (36.7 C) (09/19 7628) Pulse Rate:  [81-90] 81 (09/19 0614) Resp:  [16-18] 16 (09/19 0614) BP: (116-151)/(73-89) 139/73 (09/19 0614) SpO2:  [95 %-99 %] 97 % (09/19 0742) Weight:  [73.7 kg] 73.7 kg (09/18 1256) Weight change:  Last BM Date: 01/28/18  Intake/Output from previous day: 09/18 0701 - 09/19 0700 In: 1173 [P.O.:1080; I.V.:6; IV Piggyback:87] Out: -   PHYSICAL EXAM General appearance: alert, cooperative and no distress Resp: clear to auscultation bilaterally Cardio: regular rate and rhythm, S1, S2 normal, no murmur, click, rub or gallop GI: soft, non-tender; bowel sounds normal; no masses,  no organomegaly Extremities: extremities normal, atraumatic, no cyanosis or edema She has prolonged expiratory phase but no wheezing now Lab Results:  Results for orders placed or performed during the hospital encounter of 01/28/18 (from the past 48 hour(s))  HIV antibody (Routine Testing)     Status: None   Collection Time: 01/28/18 11:31 AM  Result Value Ref Range   HIV Screen 4th Generation wRfx Non Reactive Non Reactive    Comment: (NOTE) Performed At: [facility] [place], [place] [id number] Rush Farmer MD VP:[id number]   Comprehensive metabolic panel     Status: Abnormal   Collection Time: 01/28/18 11:31 AM  Result Value Ref Range   Sodium 139 135 - 145 mmol/L   Potassium 4.1 3.5 - 5.1 mmol/L   Chloride 104 98 - 111 mmol/L   CO2 25 22 - 32 mmol/L   Glucose, Bld 162 (H) 70 - 99 mg/dL   BUN 15 6 - 20 mg/dL   Creatinine, Ser 0.67 0.44 - 1.00 mg/dL   Calcium 9.4 8.9 - 10.3 mg/dL   Total Protein 7.1 6.5 - 8.1 g/dL   Albumin 4.2 3.5 - 5.0 g/dL   AST 24 15 - 41 U/L   ALT 24 0 - 44 U/L   Alkaline Phosphatase 63 38 - 126 U/L   Total  Bilirubin 0.6 0.3 - 1.2 mg/dL   GFR calc non Af Amer >60 >60 mL/min   GFR calc Af Amer >60 >60 mL/min    Comment: (NOTE) The eGFR has been calculated using the CKD EPI equation. This calculation has not been validated in all clinical situations. eGFR's persistently <60 mL/min signify possible Chronic Kidney Disease.    Anion gap 10 5 - 15    Comment: Performed at [facility], [address], [place], [place] [address]  CBC WITH DIFFERENTIAL     Status: None   Collection Time: 01/28/18 11:31 AM  Result Value Ref Range   WBC 4.1 4.0 - 10.5 K/uL   RBC 4.24 3.87 - 5.11 MIL/uL   Hemoglobin 13.1 12.0 - 15.0 g/dL   HCT 40.5 36.0 - 46.0 %   MCV 95.5 78.0 - 100.0 fL   MCH 30.9 26.0 - 34.0 pg   MCHC 32.3 30.0 - 36.0 g/dL   RDW 12.3 11.5 - 15.5 %   Platelets 190 150 - 400 K/uL   Neutrophils Relative % 74 %   Neutro Abs 3.0 1.7 - 7.7 K/uL   Lymphocytes Relative 18 %   Lymphs Abs 0.8 0.7 - 4.0 K/uL   Monocytes Relative 8 %   Monocytes Absolute 0.3 0.1 - 1.0 K/uL   Eosinophils Relative 0 %  Eosinophils Absolute 0.0 0.0 - 0.7 K/uL   Basophils Relative 0 %   Basophils Absolute 0.0 0.0 - 0.1 K/uL    Comment: Performed at [facility], [address] [place], [place] [address]  Glucose, capillary     Status: Abnormal   Collection Time: 01/28/18 11:47 AM  Result Value Ref Range   Glucose-Capillary 161 (H) 70 - 99 mg/dL  IgG, IgA, IgM     Status: Abnormal   Collection Time: 01/28/18  2:34 PM  Result Value Ref Range   IgG (Immunoglobin G), Serum 638 (L) 700 - 1,600 mg/dL   IgA 91 87 - 352 mg/dL   IgM (Immunoglobulin M), Srm 54 26 - 217 mg/dL    Comment: (NOTE) Performed At: [facility] [place], [place] [id number] Rush Farmer MD [id number]   Glucose, capillary     Status: Abnormal   Collection Time: 01/28/18  4:20 PM  Result Value Ref Range   Glucose-Capillary 243 (H) 70 - 99 mg/dL  Glucose, capillary     Status: Abnormal   Collection Time: 01/28/18   9:10 PM  Result Value Ref Range   Glucose-Capillary 311 (H) 70 - 99 mg/dL  Basic metabolic panel     Status: Abnormal   Collection Time: 01/29/18  4:19 AM  Result Value Ref Range   Sodium 137 135 - 145 mmol/L   Potassium 3.5 3.5 - 5.1 mmol/L   Chloride 100 98 - 111 mmol/L   CO2 27 22 - 32 mmol/L   Glucose, Bld 294 (H) 70 - 99 mg/dL   BUN 16 6 - 20 mg/dL   Creatinine, Ser 0.62 0.44 - 1.00 mg/dL   Calcium 9.6 8.9 - 10.3 mg/dL   GFR calc non Af Amer >60 >60 mL/min   GFR calc Af Amer >60 >60 mL/min    Comment: (NOTE) The eGFR has been calculated using the CKD EPI equation. This calculation has not been validated in all clinical situations. eGFR's persistently <60 mL/min signify possible Chronic Kidney Disease.    Anion gap 10 5 - 15    Comment: Performed at [facility], [address], [place], [place] [address]  Glucose, capillary     Status: Abnormal   Collection Time: 01/29/18  7:21 AM  Result Value Ref Range   Glucose-Capillary 287 (H) 70 - 99 mg/dL    ABGS No results for input(s): PHART, PO2ART, TCO2, HCO3 in the last 72 hours.  Invalid input(s): PCO2 CULTURES No results found for this or any previous visit (from the past 240 hour(s)). Studies/Results: X-ray Chest Pa And Lateral  Result Date: 01/28/2018 CLINICAL DATA:  62 year old female with a history of cough and congestion EXAM: CHEST - 2 VIEW COMPARISON:  12/26/2016, 10/22/2016 FINDINGS: Cardiomediastinal silhouette unchanged in size and contour. No evidence of central vascular congestion. No pneumothorax or pleural effusion. Low lung volumes. Mildly coarsened interstitial markings similar to prior. No confluent airspace disease. No displaced fracture.  Mild degenerative changes of the spine. IMPRESSION: Negative for acute cardiopulmonary disease Electronically Signed   By: Corrie Mckusick D.O.   On: 01/28/2018 12:53    Medications:  Prior to Admission:  Facility-Administered Medications Prior to Admission  Medication  Dose Route Frequency Provider Last Rate Last Dose  . Mepolizumab SOLR 100 mg  100 mg Subcutaneous Q28 days [name], MD   100 mg at 01/06/18 1337   Medications Prior to Admission  Medication Sig Dispense Refill Last Dose  . aspirin EC 81 MG  tablet Take 81 mg by mouth every Monday, Wednesday, and Friday. AT NIGHT   Past Week at Unknown time  . BESIVANCE 0.6 % SUSP Apply 1 drop to eye 3 (three) times daily. Place 1 drop into surgical eye     . carvedilol (COREG) 25 MG tablet Take 25 mg by mouth 2 (two) times daily with a meal.   01/19/2018 at 0630  . Difluprednate (DUREZOL) 0.05 % EMUL Apply 1 drop to eye 3 (three) times daily. Place 1 drop into surgical eye     . diphenhydrAMINE (BENADRYL) 25 MG tablet Take 25 mg by mouth at bedtime.   01/18/2018 at Unknown time  . famotidine (PEPCID) 20 MG tablet Take 20 mg by mouth at bedtime.   01/18/2018 at Unknown time  . Fluticasone-Umeclidin-Vilant (TRELEGY ELLIPTA) 100-62.5-25 MCG/INH AEPB Inhale 1 puff into the lungs daily. 60 each 5 01/18/2018 at Unknown time  . guaiFENesin (MUCINEX) 600 MG 12 hr tablet Take 1 tablet (600 mg total) by mouth 2 (two) times daily. (Patient taking differently: Take 600 mg by mouth 2 (two) times daily as needed (for cough/congestion.). ) 60 tablet 1 Past Week at Unknown time  . hydrochlorothiazide (HYDRODIURIL) 25 MG tablet Take 25 mg by mouth daily.    01/18/2018 at Unknown time  . ipratropium-albuterol (DUONEB) 0.5-2.5 (3) MG/3ML SOLN Take 3 mLs by nebulization 3 (three) times daily. (Patient taking differently: Take 3 mLs by nebulization 3 (three) times daily as needed (for wheezing/shortness of breath). ) 360 mL 5 01/19/2018 at 0615  . LORazepam (ATIVAN) 0.5 MG tablet Take 0.5 mg by mouth at bedtime.    01/18/2018 at Unknown time  . meloxicam (MOBIC) 15 MG tablet Take 15 mg by mouth daily.   01/18/2018 at Unknown time  . metFORMIN (GLUCOPHAGE-XR) 500 MG 24 hr tablet Take 1,000 mg by mouth 2 (two) times daily.   01/18/2018 at  Unknown time  . montelukast (SINGULAIR) 10 MG tablet TAKE (1) TABLET BY MOUTH AT BEDTIME. 30 tablet 3   . NUCALA 100 MG SOLR INJECT 100MG SUBCUTANEOUSLY EVERY 4 WEEKS (GIVEN AT PRESCRIBERS OFFICE) 1 each 11   . pantoprazole (PROTONIX) 40 MG tablet Take 1 tablet (40 mg total) by mouth 2 (two) times daily before a meal. (Patient taking differently: Take 40 mg by mouth daily. ) 30 tablet 2 01/18/2018 at Unknown time  . Polyethyl Glycol-Propyl Glycol (LUBRICANT EYE DROPS) 0.4-0.3 % SOLN Place 1 drop into both eyes 3 (three) times daily as needed (dry eyes).     . pravastatin (PRAVACHOL) 10 MG tablet Take 10 mg by mouth at bedtime.    01/18/2018 at Unknown time  . PROLENSA 0.07 % SOLN Apply 1 drop to eye. Place 1 drop into surgical eye     . TRESIBA FLEXTOUCH 100 UNIT/ML SOPN FlexTouch Pen INJECT 40 UNITS INTO THE SKIN DAILY AT 10 P.M. (Patient taking differently: Inject 40 Units into the skin daily at 10 pm. ) 15 mL 2 01/18/2018 at Unknown time  . triamcinolone ointment (KENALOG) 0.5 % Apply twice daily as needed. (Patient not taking: Reported on 12/02/2017) 30 g 1 Not Taking  . VENTOLIN HFA 108 (90 Base) MCG/ACT inhaler Inhale 2 puffs into the lungs every 4 (four) hours as needed for wheezing or shortness of breath.    01/19/2018 at 0700  . VICTOZA 18 MG/3ML SOPN Inject 1.2 mg into the skin daily at 8 pm. BETWEEN 1900 & 2100   01/18/2018 at Unknown time   Scheduled: .  aspirin EC  81 mg Oral Q M,W,F  . Bromfenac Sodium  1 drop Ophthalmic Daily  . carvedilol  25 mg Oral BID WC  . Difluprednate  1 drop Ophthalmic TID  . enoxaparin (LOVENOX) injection  40 mg Subcutaneous Q24H  . famotidine  20 mg Oral QHS  . fluticasone furoate-vilanterol  1 puff Inhalation Daily  . guaiFENesin  600 mg Oral BID  . hydrochlorothiazide  25 mg Oral Daily  . Immune Globulin 10%  2 g/kg Intravenous Q24H  . insulin aspart  0-20 Units Subcutaneous TID WC  . insulin aspart  0-5 Units Subcutaneous QHS  . insulin glargine  30 Units  Subcutaneous QHS  . ipratropium-albuterol  3 mL Nebulization Q4H  . LORazepam  0.5 mg Oral QHS  . methylPREDNISolone (SOLU-MEDROL) injection  40 mg Intravenous Q6H  . montelukast  10 mg Oral QHS  . pantoprazole  40 mg Oral Daily  . potassium chloride  40 mEq Oral Once  . pravastatin  10 mg Oral QHS  . sodium chloride flush  3 mL Intravenous Q12H  . sodium chloride flush  3 mL Intravenous Q12H  . umeclidinium bromide  1 puff Inhalation Daily   Continuous: . sodium chloride    . levofloxacin (LEVAQUIN) IV 500 mg (01/28/18 1407)   ZWR:KYBTVD chloride, albuterol, ondansetron **OR** ondansetron (ZOFRAN) IV, polyvinyl alcohol, sodium chloride flush  Assesment: She has exacerbation of asthma.  She is somewhat better than yesterday but still clearly very short of breath. Active Problems:   Asthma exacerbation    Plan:try IVIG    LOS: 1 day   Saksham Akkerman L 01/29/2018, 8:57 AM

## 2018-01-29 NOTE — H&P (Signed)
Kathy Tran MRN: [id number] DOB/AGE: [birth year] 62 y.o. Primary Care Physician:Fagan, Carloyn Manner, MD Admit date: 01/28/2018 Chief Complaint: Shortness of breath HPI: This is a 62 year old who is known to have severe persistent asthma and has had multiple hospitalizations for that.  She has done well for the last year or so but has had more trouble in the last 2 weeks.  She is been in my office on 3 occasions has received injectable antibiotics and steroids but continues to have shortness of breath cough and congestion.  She is not responding to outpatient treatment and she is admitted because of that.  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  She has been started on allergy injections.  She has also been started on IV immunoglobulin.  She is had some problems with being more anxious because of her illness.  Past Medical History:  Diagnosis Date  . Angio-edema   . Asthma    Flu march and May 2018  . COPD (chronic obstructive pulmonary disease) ([place])   . Diabetes mellitus type 2, controlled ([place])    x5 yrs  . Eczema   . Heart murmur   . Hypertension    x 5 yrs  . Recurrent upper respiratory infection (URI)   . Renal disorder    renal stones  . Urticaria    Past Surgical History:  Procedure Laterality Date  . ADENOIDECTOMY    . CATARACT EXTRACTION W/PHACO Left 01/19/2018   Procedure: CATARACT EXTRACTION PHACO AND INTRAOCULAR LENS PLACEMENT LEFT EYE;  Surgeon: Tonny Branch, MD;  Location: [facility];  Service: Ophthalmology;  Laterality: Left;  CDE: 7.52  . [place]  . COLONOSCOPY  03/27/2011   Procedure: COLONOSCOPY;  Surgeon: Rogene Houston, MD;  Location: [facility];  Service: Endoscopy;  Laterality: N/A;  2:00  . CYSTOSCOPY W/ URETERAL STENT PLACEMENT Left 11/08/2015   Procedure: CYSTOSCOPY, URETEROSCOPY, WITH STONE  BASKET EXTRACTION;  Surgeon: Raynelle Bring, MD;  Location: [facility];  Service: Urology;  Laterality: Left;  . CYSTOSCOPY/RETROGRADE/URETEROSCOPY/STONE EXTRACTION WITH BASKET Right 07/03/2015   Procedure: CYSTOSCOPY/RETROGRADE/URETEROSCOPY/STONE EXTRACTION WITH BASKET;  Surgeon: Irine Seal, MD;  Location: [facility];  Service: Urology;  Laterality: Right;  . ENDOMETRIAL ABLATION    . FLEXIBLE BRONCHOSCOPY Bilateral 10/31/2016   Procedure: FLEXIBLE BRONCHOSCOPY WITH PROPOFOL;  Surgeon: Sinda Du, MD;  Location: [facility];  Service: Cardiopulmonary;  Laterality: Bilateral;  . FLEXIBLE BRONCHOSCOPY N/A 01/17/2017   Procedure: FLEXIBLE BRONCHOSCOPY WITH PROPOFOL;  Surgeon: Sinda Du, MD;  Location: [facility];  Service: Cardiopulmonary;  Laterality: N/A;  . KIDNEY STONE SURGERY     multiple  . SHOULDER SURGERY    . SINOSCOPY    . TONSILLECTOMY    . TUBAL LIGATION  1991  . TYMPANOSTOMY TUBE PLACEMENT          Family History  Problem Relation Age of Onset  . COPD Mother   . Cancer Father     Social History:  reports that she has never smoked. She has never used smokeless tobacco. She reports that she drinks alcohol. She reports that she does not use drugs.   Allergies:  Allergies  Allergen Reactions  . Sulfa Antibiotics Anaphylaxis    Facility-Administered Medications Prior to Admission  Medication Dose Route Frequency Provider Last Rate Last Dose  . Mepolizumab SOLR 100 mg  100 mg  Subcutaneous Q28 days [name], MD   100 mg at 01/06/18 1337   Medications Prior to Admission  Medication Sig Dispense Refill  . aspirin EC 81 MG tablet Take 81 mg by mouth every Monday, Wednesday, and Friday. AT NIGHT    . BESIVANCE 0.6 % SUSP Apply 1 drop to eye 3 (three) times daily. Place 1 drop into surgical eye    . carvedilol (COREG) 25 MG tablet Take 25 mg by mouth 2 (two) times daily with a meal.    . Difluprednate (DUREZOL) 0.05 % EMUL Apply 1 drop to eye 3 (three)  times daily. Place 1 drop into surgical eye    . diphenhydrAMINE (BENADRYL) 25 MG tablet Take 25 mg by mouth at bedtime.    . famotidine (PEPCID) 20 MG tablet Take 20 mg by mouth at bedtime.    . Fluticasone-Umeclidin-Vilant (TRELEGY ELLIPTA) 100-62.5-25 MCG/INH AEPB Inhale 1 puff into the lungs daily. 60 each 5  . guaiFENesin (MUCINEX) 600 MG 12 hr tablet Take 1 tablet (600 mg total) by mouth 2 (two) times daily. (Patient taking differently: Take 600 mg by mouth 2 (two) times daily as needed (for cough/congestion.). ) 60 tablet 1  . hydrochlorothiazide (HYDRODIURIL) 25 MG tablet Take 25 mg by mouth daily.     Marland Kitchen ipratropium-albuterol (DUONEB) 0.5-2.5 (3) MG/3ML SOLN Take 3 mLs by nebulization 3 (three) times daily. (Patient taking differently: Take 3 mLs by nebulization 3 (three) times daily as needed (for wheezing/shortness of breath). ) 360 mL 5  . LORazepam (ATIVAN) 0.5 MG tablet Take 0.5 mg by mouth at bedtime.     . meloxicam (MOBIC) 15 MG tablet Take 15 mg by mouth daily.    . metFORMIN (GLUCOPHAGE-XR) 500 MG 24 hr tablet Take 1,000 mg by mouth 2 (two) times daily.    . montelukast (SINGULAIR) 10 MG tablet TAKE (1) TABLET BY MOUTH AT BEDTIME. 30 tablet 3  . NUCALA 100 MG SOLR INJECT 100MG SUBCUTANEOUSLY EVERY 4 WEEKS (GIVEN AT PRESCRIBERS OFFICE) 1 each 11  . pantoprazole (PROTONIX) 40 MG tablet Take 1 tablet (40 mg total) by mouth 2 (two) times daily before a meal. (Patient taking differently: Take 40 mg by mouth daily. ) 30 tablet 2  . Polyethyl Glycol-Propyl Glycol (LUBRICANT EYE DROPS) 0.4-0.3 % SOLN Place 1 drop into both eyes 3 (three) times daily as needed (dry eyes).    . pravastatin (PRAVACHOL) 10 MG tablet Take 10 mg by mouth at bedtime.     Marland Kitchen PROLENSA 0.07 % SOLN Apply 1 drop to eye. Place 1 drop into surgical eye    . TRESIBA FLEXTOUCH 100 UNIT/ML SOPN FlexTouch Pen INJECT 40 UNITS INTO THE SKIN DAILY AT 10 P.M. (Patient taking differently: Inject 40 Units into the skin daily at 10  pm. ) 15 mL 2  . triamcinolone ointment (KENALOG) 0.5 % Apply twice daily as needed. (Patient not taking: Reported on 12/02/2017) 30 g 1  . VENTOLIN HFA 108 (90 Base) MCG/ACT inhaler Inhale 2 puffs into the lungs every 4 (four) hours as needed for wheezing or shortness of breath.     Marland Kitchen VICTOZA 18 MG/3ML SOPN Inject 1.2 mg into the skin daily at 8 pm. BETWEEN 1900 & 2100         ZOX:WRUEA from the symptoms mentioned above,there are no other symptoms referable to all systems reviewed.  Physical Exam: Blood pressure 139/73, pulse 81, temperature 98 F (36.7 C), temperature source Oral, resp. rate 16, height _0  (  1.575 m), weight 73.7 kg, SpO2 97 %. Constitutional: She is awake and alert and she is in moderate distress because of shortness of breath.  Eyes: Pupils react EOMI.  Ears nose mouth and throat: Her mucous membranes are dry.  Throat is clear.  Cardiovascular: Her heart is regular with no gallop.  Respiratory: She has increased respiratory effort.  She has bilateral wheezing and rhonchi.  She has mild use of accessory muscles of respiration.  Gastrointestinal: Her abdomen is soft with no masses.  Skin: Warm and dry.  Musculoskeletal: Grossly normal strength upper and lower extremities bilaterally.  Neurological: No focal abnormalities.  Psychiatric: She appears mildly anxious   Recent Labs    01/28/18 1131  WBC 4.1  NEUTROABS 3.0  HGB 13.1  HCT 40.5  MCV 95.5  PLT 190   Recent Labs    01/28/18 1131 01/29/18 0419  NA 139 137  K 4.1 3.5  CL 104 100  CO2 25 27  GLUCOSE 162* 294*  BUN 15 16  CREATININE 0.67 0.62  CALCIUM 9.4 9.6  lablast2(ast:2,ALT:2,alkphos:2,bilitot:2,prot:2,albumin:2)@    No results found for this or any previous visit (from the past 240 hour(s)).   X-ray Chest Pa And Lateral  Result Date: 01/28/2018 CLINICAL DATA:  62 year old female with a history of cough and congestion EXAM: CHEST - 2 VIEW COMPARISON:  12/26/2016, 10/22/2016 FINDINGS:  Cardiomediastinal silhouette unchanged in size and contour. No evidence of central vascular congestion. No pneumothorax or pleural effusion. Low lung volumes. Mildly coarsened interstitial markings similar to prior. No confluent airspace disease. No displaced fracture.  Mild degenerative changes of the spine. IMPRESSION: Negative for acute cardiopulmonary disease Electronically Signed   By: Corrie Mckusick D.O.   On: 01/28/2018 12:53   Impression: She has asthma exacerbation.  Probably some element of bronchitis.  She is not responding to outpatient treatment.  She has severe allergies which are causing some of the problem with this as well.  She has common variable immunodeficiency and she is likely to require IVIG this admission  He has diabetes and her blood sugars not as good since she has been started on steroids Active Problems:   Asthma exacerbation     Plan: Admit for IV antibiotics IV steroids etc.      Aden Youngman L   01/29/2018, 8:27 AM

## 2018-01-30 LAB — GLUCOSE, CAPILLARY
Glucose-Capillary: 313 mg/dL — ABNORMAL HIGH (ref 70–99)
Glucose-Capillary: 364 mg/dL — ABNORMAL HIGH (ref 70–99)
Glucose-Capillary: 233 mg/dL — ABNORMAL HIGH (ref 70–99)
Glucose-Capillary: 275 mg/dL — ABNORMAL HIGH (ref 70–99)

## 2018-01-30 NOTE — Progress Notes (Signed)
Subjective: She was admitted with exacerbation of asthma/acute bronchitis.  She is much improved today.  She received IVIG yesterday.  She is complaining of some thrush on her mouth and she has been started on Diflucan.  She does not feel any heaviness in her chest.  She is not coughing as much.  Objective: Vital signs in last 24 hours: Temp:  [97.8 F (36.6 C)-98.5 F (36.9 C)] 98 F (36.7 C) (09/20 0512) Pulse Rate:  [74-90] 74 (09/20 0512) Resp:  [16-18] 16 (09/20 0512) BP: (110-141)/(59-79) 110/65 (09/20 0512) SpO2:  [95 %-100 %] 98 % (09/20 0740) Weight change:  Last BM Date: 01/29/18  Intake/Output from previous day: 09/19 0701 - 09/20 0700 In: 2001.8 [P.O.:1680; I.V.:223.9; IV Piggyback:97.9] Out: -   PHYSICAL EXAM General appearance: alert, cooperative and no distress Resp: clear to auscultation bilaterally Cardio: regular rate and rhythm, S1, S2 normal, no murmur, click, rub or gallop GI: soft, non-tender; bowel sounds normal; no masses,  no organomegaly Extremities: extremities normal, atraumatic, no cyanosis or edema  Lab Results:  Results for orders placed or performed during the hospital encounter of 01/28/18 (from the past 48 hour(s))  HIV antibody (Routine Testing)     Status: None   Collection Time: 01/28/18 11:31 AM  Result Value Ref Range   HIV Screen 4th Generation wRfx Non Reactive Non Reactive    Comment: (NOTE) Performed At: [facility] [place], [place] [id number] Rush Farmer MD WJ:[id number]   Comprehensive metabolic panel     Status: Abnormal   Collection Time: 01/28/18 11:31 AM  Result Value Ref Range   Sodium 139 135 - 145 mmol/L   Potassium 4.1 3.5 - 5.1 mmol/L   Chloride 104 98 - 111 mmol/L   CO2 25 22 - 32 mmol/L   Glucose, Bld 162 (H) 70 - 99 mg/dL   BUN 15 6 - 20 mg/dL   Creatinine, Ser 0.67 0.44 - 1.00 mg/dL   Calcium 9.4 8.9 - 10.3 mg/dL   Total Protein 7.1 6.5 - 8.1 g/dL   Albumin 4.2 3.5 - 5.0 g/dL    AST 24 15 - 41 U/L   ALT 24 0 - 44 U/L   Alkaline Phosphatase 63 38 - 126 U/L   Total Bilirubin 0.6 0.3 - 1.2 mg/dL   GFR calc non Af Amer >60 >60 mL/min   GFR calc Af Amer >60 >60 mL/min    Comment: (NOTE) The eGFR has been calculated using the CKD EPI equation. This calculation has not been validated in all clinical situations. eGFR's persistently <60 mL/min signify possible Chronic Kidney Disease.    Anion gap 10 5 - 15    Comment: Performed at [facility], [address] [place], [place] [address]  CBC WITH DIFFERENTIAL     Status: None   Collection Time: 01/28/18 11:31 AM  Result Value Ref Range   WBC 4.1 4.0 - 10.5 K/uL   RBC 4.24 3.87 - 5.11 MIL/uL   Hemoglobin 13.1 12.0 - 15.0 g/dL   HCT 40.5 36.0 - 46.0 %   MCV 95.5 78.0 - 100.0 fL   MCH 30.9 26.0 - 34.0 pg   MCHC 32.3 30.0 - 36.0 g/dL   RDW 12.3 11.5 - 15.5 %   Platelets 190 150 - 400 K/uL   Neutrophils Relative % 74 %   Neutro Abs 3.0 1.7 - 7.7 K/uL   Lymphocytes Relative 18 %   Lymphs Abs 0.8 0.7 - 4.0 K/uL   Monocytes  Relative 8 %   Monocytes Absolute 0.3 0.1 - 1.0 K/uL   Eosinophils Relative 0 %   Eosinophils Absolute 0.0 0.0 - 0.7 K/uL   Basophils Relative 0 %   Basophils Absolute 0.0 0.0 - 0.1 K/uL    Comment: Performed at [facility], [address] [place], [place] [address]  Glucose, capillary     Status: Abnormal   Collection Time: 01/28/18 11:47 AM  Result Value Ref Range   Glucose-Capillary 161 (H) 70 - 99 mg/dL  IgG, IgA, IgM     Status: Abnormal   Collection Time: 01/28/18  2:34 PM  Result Value Ref Range   IgG (Immunoglobin G), Serum 638 (L) 700 - 1,600 mg/dL   IgA 91 87 - 352 mg/dL   IgM (Immunoglobulin M), Srm 54 26 - 217 mg/dL    Comment: (NOTE) Performed At: [facility] [place], [place] [id number] Rush Farmer MD [id number]   Glucose, capillary     Status: Abnormal   Collection Time: 01/28/18  4:20 PM  Result Value Ref Range   Glucose-Capillary 243 (H)  70 - 99 mg/dL  Glucose, capillary     Status: Abnormal   Collection Time: 01/28/18  9:10 PM  Result Value Ref Range   Glucose-Capillary 311 (H) 70 - 99 mg/dL  Basic metabolic panel     Status: Abnormal   Collection Time: 01/29/18  4:19 AM  Result Value Ref Range   Sodium 137 135 - 145 mmol/L   Potassium 3.5 3.5 - 5.1 mmol/L   Chloride 100 98 - 111 mmol/L   CO2 27 22 - 32 mmol/L   Glucose, Bld 294 (H) 70 - 99 mg/dL   BUN 16 6 - 20 mg/dL   Creatinine, Ser 0.62 0.44 - 1.00 mg/dL   Calcium 9.6 8.9 - 10.3 mg/dL   GFR calc non Af Amer >60 >60 mL/min   GFR calc Af Amer >60 >60 mL/min    Comment: (NOTE) The eGFR has been calculated using the CKD EPI equation. This calculation has not been validated in all clinical situations. eGFR's persistently <60 mL/min signify possible Chronic Kidney Disease.    Anion gap 10 5 - 15    Comment: Performed at [facility], [address] [place], [place] [address]  Glucose, capillary     Status: Abnormal   Collection Time: 01/29/18  7:21 AM  Result Value Ref Range   Glucose-Capillary 287 (H) 70 - 99 mg/dL  Glucose, capillary     Status: Abnormal   Collection Time: 01/29/18 11:25 AM  Result Value Ref Range   Glucose-Capillary 325 (H) 70 - 99 mg/dL  Glucose, capillary     Status: Abnormal   Collection Time: 01/29/18  4:19 PM  Result Value Ref Range   Glucose-Capillary 306 (H) 70 - 99 mg/dL  Glucose, capillary     Status: Abnormal   Collection Time: 01/29/18  9:15 PM  Result Value Ref Range   Glucose-Capillary 321 (H) 70 - 99 mg/dL  Glucose, capillary     Status: Abnormal   Collection Time: 01/30/18  7:32 AM  Result Value Ref Range   Glucose-Capillary 313 (H) 70 - 99 mg/dL    ABGS No results for input(s): PHART, PO2ART, TCO2, HCO3 in the last 72 hours.  Invalid input(s): PCO2 CULTURES No results found for this or any previous visit (from the past 240 hour(s)). Studies/Results: X-ray Chest Pa And Lateral  Result Date: 01/28/2018 CLINICAL  DATA:  62 year old female with a  history of cough and congestion EXAM: CHEST - 2 VIEW COMPARISON:  12/26/2016, 10/22/2016 FINDINGS: Cardiomediastinal silhouette unchanged in size and contour. No evidence of central vascular congestion. No pneumothorax or pleural effusion. Low lung volumes. Mildly coarsened interstitial markings similar to prior. No confluent airspace disease. No displaced fracture.  Mild degenerative changes of the spine. IMPRESSION: Negative for acute cardiopulmonary disease Electronically Signed   By: Corrie Mckusick D.O.   On: 01/28/2018 12:53    Medications:  Prior to Admission:  Facility-Administered Medications Prior to Admission  Medication Dose Route Frequency Provider Last Rate Last Dose  . Mepolizumab SOLR 100 mg  100 mg Subcutaneous Q28 days [name], MD   100 mg at 01/06/18 1337   Medications Prior to Admission  Medication Sig Dispense Refill Last Dose  . aspirin EC 81 MG tablet Take 81 mg by mouth every Monday, Wednesday, and Friday. AT NIGHT   Past Week at Unknown time  . BESIVANCE 0.6 % SUSP Apply 1 drop to eye 3 (three) times daily. Place 1 drop into surgical eye     . carvedilol (COREG) 25 MG tablet Take 25 mg by mouth 2 (two) times daily with a meal.   01/19/2018 at 0630  . Difluprednate (DUREZOL) 0.05 % EMUL Apply 1 drop to eye 3 (three) times daily. Place 1 drop into surgical eye     . diphenhydrAMINE (BENADRYL) 25 MG tablet Take 25 mg by mouth at bedtime.   01/18/2018 at Unknown time  . famotidine (PEPCID) 20 MG tablet Take 20 mg by mouth at bedtime.   01/18/2018 at Unknown time  . Fluticasone-Umeclidin-Vilant (TRELEGY ELLIPTA) 100-62.5-25 MCG/INH AEPB Inhale 1 puff into the lungs daily. 60 each 5 01/18/2018 at Unknown time  . guaiFENesin (MUCINEX) 600 MG 12 hr tablet Take 1 tablet (600 mg total) by mouth 2 (two) times daily. (Patient taking differently: Take 600 mg by mouth 2 (two) times daily as needed (for cough/congestion.). ) 60 tablet 1 Past Week at  Unknown time  . hydrochlorothiazide (HYDRODIURIL) 25 MG tablet Take 25 mg by mouth daily.    01/18/2018 at Unknown time  . ipratropium-albuterol (DUONEB) 0.5-2.5 (3) MG/3ML SOLN Take 3 mLs by nebulization 3 (three) times daily. (Patient taking differently: Take 3 mLs by nebulization 3 (three) times daily as needed (for wheezing/shortness of breath). ) 360 mL 5 01/19/2018 at 0615  . LORazepam (ATIVAN) 0.5 MG tablet Take 0.5 mg by mouth at bedtime.    01/18/2018 at Unknown time  . meloxicam (MOBIC) 15 MG tablet Take 15 mg by mouth daily.   01/18/2018 at Unknown time  . metFORMIN (GLUCOPHAGE-XR) 500 MG 24 hr tablet Take 1,000 mg by mouth 2 (two) times daily.   01/18/2018 at Unknown time  . montelukast (SINGULAIR) 10 MG tablet TAKE (1) TABLET BY MOUTH AT BEDTIME. 30 tablet 3   . NUCALA 100 MG SOLR INJECT 100MG SUBCUTANEOUSLY EVERY 4 WEEKS (GIVEN AT PRESCRIBERS OFFICE) 1 each 11   . pantoprazole (PROTONIX) 40 MG tablet Take 1 tablet (40 mg total) by mouth 2 (two) times daily before a meal. (Patient taking differently: Take 40 mg by mouth daily. ) 30 tablet 2 01/18/2018 at Unknown time  . Polyethyl Glycol-Propyl Glycol (LUBRICANT EYE DROPS) 0.4-0.3 % SOLN Place 1 drop into both eyes 3 (three) times daily as needed (dry eyes).     . pravastatin (PRAVACHOL) 10 MG tablet Take 10 mg by mouth at bedtime.    01/18/2018 at Unknown time  . PROLENSA  0.07 % SOLN Apply 1 drop to eye. Place 1 drop into surgical eye     . TRESIBA FLEXTOUCH 100 UNIT/ML SOPN FlexTouch Pen INJECT 40 UNITS INTO THE SKIN DAILY AT 10 P.M. (Patient taking differently: Inject 40 Units into the skin daily at 10 pm. ) 15 mL 2 01/18/2018 at Unknown time  . triamcinolone ointment (KENALOG) 0.5 % Apply twice daily as needed. (Patient not taking: Reported on 12/02/2017) 30 g 1 Not Taking  . VENTOLIN HFA 108 (90 Base) MCG/ACT inhaler Inhale 2 puffs into the lungs every 4 (four) hours as needed for wheezing or shortness of breath.    01/19/2018 at 0700  . VICTOZA 18  MG/3ML SOPN Inject 1.2 mg into the skin daily at 8 pm. BETWEEN 1900 & 2100   01/18/2018 at Unknown time   Scheduled: . aspirin EC  81 mg Oral Q M,W,F  . Bromfenac Sodium  1 drop Ophthalmic Daily  . carvedilol  25 mg Oral BID WC  . Difluprednate  1 drop Ophthalmic TID  . enoxaparin (LOVENOX) injection  40 mg Subcutaneous Q24H  . famotidine  20 mg Oral QHS  . fluconazole  100 mg Oral Daily  . fluticasone furoate-vilanterol  1 puff Inhalation Daily  . guaiFENesin  600 mg Oral BID  . hydrochlorothiazide  25 mg Oral Daily  . Immune Globulin 10%  400 mg/kg Intravenous Q24 Hr x 2  . insulin aspart  0-20 Units Subcutaneous TID WC  . insulin aspart  0-5 Units Subcutaneous QHS  . insulin glargine  30 Units Subcutaneous QHS  . ipratropium-albuterol  3 mL Nebulization Q4H  . LORazepam  0.5 mg Oral QHS  . methylPREDNISolone (SOLU-MEDROL) injection  40 mg Intravenous Q6H  . montelukast  10 mg Oral QHS  . pantoprazole  40 mg Oral Daily  . pravastatin  10 mg Oral QHS  . sodium chloride flush  3 mL Intravenous Q12H  . sodium chloride flush  3 mL Intravenous Q12H  . umeclidinium bromide  1 puff Inhalation Daily   Continuous: . sodium chloride    . levofloxacin (LEVAQUIN) IV 500 mg (01/29/18 1019)   DTO:IZTIWP chloride, albuterol, ondansetron **OR** ondansetron (ZOFRAN) IV, polyvinyl alcohol, sodium chloride flush  Assesment: She was admitted with asthma exacerbation.  She is better.  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  She has some thrush so she is been on Diflucan.  Overall she is much better.  She has diabetes which is doing fairly well considering the steroid Active Problems:   Asthma exacerbation    Plan: Continue treatments.  No change in medications at this point.    LOS: 2 days   HAWKINS,EDWARD L 01/30/2018, 8:57 AM

## 2018-01-30 NOTE — Progress Notes (Signed)
Inpatient Diabetes Program Recommendations  AACE/ADA: New Consensus Statement on Inpatient Glycemic Control (2015)  Target Ranges:  Prepandial:   less than 140 mg/dL      Peak postprandial:   less than 180 mg/dL (1-2 hours)      Critically ill patients:  140 - 180 mg/dL   Lab Results  Component Value Date   GLUCAP 364 (H) 01/30/2018   HGBA1C 7.6 (H) 01/14/2018    Review of Glycemic Control Results for Kathy Tran, Kathy Tran (MRN [id number]) as of 01/30/2018 11:51  Ref. Range 01/28/2018 11:47 01/28/2018 16:20 01/28/2018 21:10 01/29/2018 07:21 01/29/2018 11:25 01/29/2018 16:19 01/29/2018 21:15 01/30/2018 07:32 01/30/2018 11:24  Glucose-Capillary Latest Ref Range: 70 - 99 mg/dL 161 (H) 243 (H) 311 (H) 287 (H) 325 (H) 306 (H) 321 (H) 313 (H) 364 (H)   Diabetes history: Type 2 DM  Outpatient Diabetes medications:  Metformin 1000 mg bid, Tresiba 40 units q HS, Victoza 1.2 mg daily Current orders for Inpatient glycemic control:  Lantus 30 units daily, Novolog resistant tid with meals Solumedrol 40 mg IV q 6 hours Inpatient Diabetes Program Recommendations:   Please increase Lantus to 40 units daily.  Also please add Novolog meal coverage 6 units tid with meals (hold if patient eats less than 50%).   Thanks,  Adah Perl, RN, BC-ADM Inpatient Diabetes Coordinator Pager [phone] (8a-5p)

## 2018-01-30 NOTE — Progress Notes (Signed)
PHARMACIST - PHYSICIAN COMMUNICATION DR:   Luan Pulling CONCERNING: Antibiotic IV to Oral Route Change Policy  RECOMMENDATION: This patient is receiving Levaquin by the intravenous route.  Based on criteria approved by the Pharmacy and Therapeutics Committee, the antibiotic(s) is/are being converted to the equivalent oral dose form(s).   DESCRIPTION: These criteria include:  Patient being treated for a respiratory tract infection, urinary tract infection, cellulitis or clostridium difficile associated diarrhea if on metronidazole  The patient is not neutropenic and does not exhibit a GI malabsorption state  The patient is eating (either orally or via tube) and/or has been taking other orally administered medications for a least 24 hours  The patient is improving clinically and has a Tmax < 100.5  If you have questions about this conversion, please contact the Pharmacy Department  [x]   [phone] )  [name] []   [phone] )  [facility] Pontes  []   [phone] )  [facility] []   [phone] )  [place], [place]. D. Clinical Pharmacist 01/30/2018 1:13 PM

## 2018-01-31 LAB — GLUCOSE, CAPILLARY
GLUCOSE-CAPILLARY: 240 mg/dL — AB (ref 70–99)
GLUCOSE-CAPILLARY: 434 mg/dL — AB (ref 70–99)
GLUCOSE-CAPILLARY: 252 mg/dL — AB (ref 70–99)
GLUCOSE-CAPILLARY: 176 mg/dL — AB (ref 70–99)

## 2018-01-31 MED ORDER — PREDNISONE 20 MG PO TABS
40.0000 mg | ORAL_TABLET | Freq: Every day | ORAL | Status: DC
  Administered 2018-01-31 – 2018-02-01 (×2): 40 mg via ORAL
  Filled 2018-01-31 (×2): qty 2

## 2018-01-31 MED ORDER — LEVOFLOXACIN 500 MG PO TABS
500.0000 mg | ORAL_TABLET | Freq: Every day | ORAL | Status: DC
  Administered 2018-01-31 – 2018-02-01 (×2): 500 mg via ORAL
  Filled 2018-01-31 (×2): qty 1

## 2018-01-31 NOTE — Progress Notes (Signed)
Subjective: She says she feels much better.  She has no new complaints.  Her breathing is significantly better.  Objective: Vital signs in last 24 hours: Temp:  [97.7 F (36.5 C)-98.4 F (36.9 C)] 97.7 F (36.5 C) (09/21 0649) Pulse Rate:  [64-80] 64 (09/21 0649) Resp:  [16-20] 20 (09/21 0649) BP: (127-150)/(73-94) 143/82 (09/21 0649) SpO2:  [96 %-100 %] 97 % (09/21 0714) Weight:  [72.5 kg] 72.5 kg (09/20 1000) Weight change:  Last BM Date: 01/29/18  Intake/Output from previous day: 09/20 0701 - 09/21 0700 In: 603 [P.O.:600; I.V.:3] Out: -   PHYSICAL EXAM General appearance: alert, cooperative and no distress Resp: clear to auscultation bilaterally Cardio: regular rate and rhythm, S1, S2 normal, no murmur, click, rub or gallop GI: soft, non-tender; bowel sounds normal; no masses,  no organomegaly Extremities: extremities normal, atraumatic, no cyanosis or edema  Lab Results:  Results for orders placed or performed during the hospital encounter of 01/28/18 (from the past 48 hour(s))  Glucose, capillary     Status: Abnormal   Collection Time: 01/29/18 11:25 AM  Result Value Ref Range   Glucose-Capillary 325 (H) 70 - 99 mg/dL  Glucose, capillary     Status: Abnormal   Collection Time: 01/29/18  4:19 PM  Result Value Ref Range   Glucose-Capillary 306 (H) 70 - 99 mg/dL  Glucose, capillary     Status: Abnormal   Collection Time: 01/29/18  9:15 PM  Result Value Ref Range   Glucose-Capillary 321 (H) 70 - 99 mg/dL  Glucose, capillary     Status: Abnormal   Collection Time: 01/30/18  7:32 AM  Result Value Ref Range   Glucose-Capillary 313 (H) 70 - 99 mg/dL  Glucose, capillary     Status: Abnormal   Collection Time: 01/30/18 11:24 AM  Result Value Ref Range   Glucose-Capillary 364 (H) 70 - 99 mg/dL  Glucose, capillary     Status: Abnormal   Collection Time: 01/30/18  4:39 PM  Result Value Ref Range   Glucose-Capillary 233 (H) 70 - 99 mg/dL  Glucose, capillary     Status:  Abnormal   Collection Time: 01/30/18 10:33 PM  Result Value Ref Range   Glucose-Capillary 275 (H) 70 - 99 mg/dL   Comment 1 Notify RN    Comment 2 Document in Chart   Glucose, capillary     Status: Abnormal   Collection Time: 01/31/18  7:44 AM  Result Value Ref Range   Glucose-Capillary 240 (H) 70 - 99 mg/dL    ABGS No results for input(s): PHART, PO2ART, TCO2, HCO3 in the last 72 hours.  Invalid input(s): PCO2 CULTURES No results found for this or any previous visit (from the past 240 hour(s)). Studies/Results: No results found.  Medications:  Prior to Admission:  Facility-Administered Medications Prior to Admission  Medication Dose Route Frequency Provider Last Rate Last Dose  . Mepolizumab SOLR 100 mg  100 mg Subcutaneous Q28 days [name], MD   100 mg at 01/06/18 1337   Medications Prior to Admission  Medication Sig Dispense Refill Last Dose  . aspirin EC 81 MG tablet Take 81 mg by mouth every Monday, Wednesday, and Friday. AT NIGHT   Past Week at Unknown time  . carvedilol (COREG) 25 MG tablet Take 25 mg by mouth 2 (two) times daily with a meal.   01/29/2018 at 800  . Difluprednate (DUREZOL) 0.05 % EMUL Apply 1 drop to eye 3 (three) times daily. Place 1 drop into surgical  eye   01/29/2018 at Unknown time  . diphenhydrAMINE (BENADRYL) 25 MG tablet Take 25 mg by mouth at bedtime.   01/29/2018 at Unknown time  . famotidine (PEPCID) 20 MG tablet Take 20 mg by mouth at bedtime.   01/29/2018 at Unknown time  . Fluticasone-Umeclidin-Vilant (TRELEGY ELLIPTA) 100-62.5-25 MCG/INH AEPB Inhale 1 puff into the lungs daily. 60 each 5 01/29/2018 at Unknown time  . guaiFENesin (MUCINEX) 600 MG 12 hr tablet Take 1 tablet (600 mg total) by mouth 2 (two) times daily. (Patient taking differently: Take 600 mg by mouth 2 (two) times daily as needed (for cough/congestion.). ) 60 tablet 1 01/29/2018 at Unknown time  . hydrochlorothiazide (HYDRODIURIL) 25 MG tablet Take 25 mg by mouth daily.     01/29/2018 at Unknown time  . ipratropium-albuterol (DUONEB) 0.5-2.5 (3) MG/3ML SOLN Take 3 mLs by nebulization 3 (three) times daily. (Patient taking differently: Take 3 mLs by nebulization 3 (three) times daily as needed (for wheezing/shortness of breath). ) 360 mL 5 01/29/2018 at Unknown time  . LORazepam (ATIVAN) 0.5 MG tablet Take 0.5 mg by mouth at bedtime.    01/29/2018 at Unknown time  . meloxicam (MOBIC) 15 MG tablet Take 15 mg by mouth daily.   01/18/2018 at Unknown time  . metFORMIN (GLUCOPHAGE-XR) 500 MG 24 hr tablet Take 1,000 mg by mouth 2 (two) times daily.   01/18/2018 at Unknown time  . montelukast (SINGULAIR) 10 MG tablet TAKE (1) TABLET BY MOUTH AT BEDTIME. 30 tablet 3 01/29/2018 at Unknown time  . NUCALA 100 MG SOLR INJECT 100MG  SUBCUTANEOUSLY EVERY 4 WEEKS (GIVEN AT PRESCRIBERS OFFICE) 1 each 11 Past Month at Unknown time  . pantoprazole (PROTONIX) 40 MG tablet Take 1 tablet (40 mg total) by mouth 2 (two) times daily before a meal. (Patient taking differently: Take 40 mg by mouth daily. ) 30 tablet 2 01/29/2018 at Unknown time  . pravastatin (PRAVACHOL) 10 MG tablet Take 10 mg by mouth at bedtime.    01/29/2018 at Unknown time  . PROLENSA 0.07 % SOLN Apply 1 drop to eye. Place 1 drop into surgical eye   01/29/2018 at Unknown time  . TRESIBA FLEXTOUCH 100 UNIT/ML SOPN FlexTouch Pen INJECT 40 UNITS INTO THE SKIN DAILY AT 10 P.M. (Patient taking differently: Inject 40 Units into the skin daily at 10 pm. ) 15 mL 2 01/18/2018 at Unknown time  . VENTOLIN HFA 108 (90 Base) MCG/ACT inhaler Inhale 2 puffs into the lungs every 4 (four) hours as needed for wheezing or shortness of breath.    01/19/2018 at 0700  . VICTOZA 18 MG/3ML SOPN Inject 1.2 mg into the skin daily at 8 pm. BETWEEN 1900 & 2100   01/18/2018 at Unknown time  . Polyethyl Glycol-Propyl Glycol (LUBRICANT EYE DROPS) 0.4-0.3 % SOLN Place 1 drop into both eyes 3 (three) times daily as needed (dry eyes).     . triamcinolone ointment (KENALOG) 0.5  % Apply twice daily as needed. (Patient not taking: Reported on 12/02/2017) 30 g 1 Not Taking at Unknown time   Scheduled: . aspirin EC  81 mg Oral Q M,W,F  . Bromfenac Sodium  1 drop Ophthalmic Daily  . carvedilol  25 mg Oral BID WC  . Difluprednate  1 drop Ophthalmic TID  . enoxaparin (LOVENOX) injection  40 mg Subcutaneous Q24H  . famotidine  20 mg Oral QHS  . fluconazole  100 mg Oral Daily  . fluticasone furoate-vilanterol  1 puff Inhalation Daily  . guaiFENesin  600 mg Oral BID  . hydrochlorothiazide  25 mg Oral Daily  . insulin aspart  0-20 Units Subcutaneous TID WC  . insulin aspart  0-5 Units Subcutaneous QHS  . insulin glargine  30 Units Subcutaneous QHS  . ipratropium-albuterol  3 mL Nebulization Q4H  . levofloxacin  500 mg Oral Daily  . LORazepam  0.5 mg Oral QHS  . montelukast  10 mg Oral QHS  . pantoprazole  40 mg Oral Daily  . pravastatin  10 mg Oral QHS  . predniSONE  40 mg Oral Q breakfast  . sodium chloride flush  3 mL Intravenous Q12H  . sodium chloride flush  3 mL Intravenous Q12H  . umeclidinium bromide  1 puff Inhalation Daily   Continuous: . sodium chloride     PYP:PJKDTO chloride, albuterol, ondansetron **OR** ondansetron (ZOFRAN) IV, polyvinyl alcohol, sodium chloride flush  Assesment: She was admitted with asthma exacerbation.  She is much improved.  She has acute bronchitis.  She has common variable immunodeficiency and she received IVIG.  She has significant issues with allergies.  She has diabetes and she is on sliding scale. Active Problems:   Asthma exacerbation    Plan: Switch to oral meds today probable discharge tomorrow    LOS: 3 days   Son Barkan L 01/31/2018, 9:36 AM

## 2018-02-01 LAB — GLUCOSE, CAPILLARY: Glucose-Capillary: 111 mg/dL — ABNORMAL HIGH (ref 70–99)

## 2018-02-01 MED ORDER — LEVOFLOXACIN 500 MG PO TABS: 500.0000 mg | ORAL_TABLET | Freq: Every day | ORAL | 1 refills | Status: DC

## 2018-02-01 NOTE — Progress Notes (Signed)
Subjective: She feels substantially better and wants to go home.  No new complaints.  Objective: Vital signs in last 24 hours: Temp:  [97.7 F (36.5 C)-97.9 F (36.6 C)] 97.7 F (36.5 C) (09/22 0602) Pulse Rate:  [64-69] 64 (09/22 0602) Resp:  [16-20] 18 (09/22 0602) BP: (131-146)/(77-84) 131/77 (09/22 0602) SpO2:  [97 %-98 %] 98 % (09/22 0727) Weight change:  Last BM Date: 01/29/18  Intake/Output from previous day: 09/21 0701 - 09/22 0700 In: 843 [P.O.:840; I.V.:3] Out: -   PHYSICAL EXAM General appearance: alert, cooperative and no distress Resp: clear to auscultation bilaterally Cardio: regular rate and rhythm, S1, S2 normal, no murmur, click, rub or gallop GI: soft, non-tender; bowel sounds normal; no masses,  no organomegaly Extremities: extremities normal, atraumatic, no cyanosis or edema  Lab Results:  Results for orders placed or performed during the hospital encounter of 01/28/18 (from the past 48 hour(s))  Glucose, capillary     Status: Abnormal   Collection Time: 01/30/18 11:24 AM  Result Value Ref Range   Glucose-Capillary 364 (H) 70 - 99 mg/dL  Glucose, capillary     Status: Abnormal   Collection Time: 01/30/18  4:39 PM  Result Value Ref Range   Glucose-Capillary 233 (H) 70 - 99 mg/dL  Glucose, capillary     Status: Abnormal   Collection Time: 01/30/18 10:33 PM  Result Value Ref Range   Glucose-Capillary 275 (H) 70 - 99 mg/dL   Comment 1 Notify RN    Comment 2 Document in Chart   Glucose, capillary     Status: Abnormal   Collection Time: 01/31/18  7:44 AM  Result Value Ref Range   Glucose-Capillary 240 (H) 70 - 99 mg/dL  Glucose, capillary     Status: Abnormal   Collection Time: 01/31/18 11:37 AM  Result Value Ref Range   Glucose-Capillary 434 (H) 70 - 99 mg/dL  Glucose, capillary     Status: Abnormal   Collection Time: 01/31/18  4:40 PM  Result Value Ref Range   Glucose-Capillary 252 (H) 70 - 99 mg/dL  Glucose, capillary     Status: Abnormal    Collection Time: 01/31/18  9:51 PM  Result Value Ref Range   Glucose-Capillary 176 (H) 70 - 99 mg/dL   Comment 1 Notify RN    Comment 2 Document in Chart   Glucose, capillary     Status: Abnormal   Collection Time: 02/01/18  8:03 AM  Result Value Ref Range   Glucose-Capillary 111 (H) 70 - 99 mg/dL    ABGS No results for input(s): PHART, PO2ART, TCO2, HCO3 in the last 72 hours.  Invalid input(s): PCO2 CULTURES No results found for this or any previous visit (from the past 240 hour(s)). Studies/Results: No results found.  Medications:  Prior to Admission:  Facility-Administered Medications Prior to Admission  Medication Dose Route Frequency Provider Last Rate Last Dose  . Mepolizumab SOLR 100 mg  100 mg Subcutaneous Q28 days [name], MD   100 mg at 01/06/18 1337   Medications Prior to Admission  Medication Sig Dispense Refill Last Dose  . aspirin EC 81 MG tablet Take 81 mg by mouth every Monday, Wednesday, and Friday. AT NIGHT   Past Week at Unknown time  . carvedilol (COREG) 25 MG tablet Take 25 mg by mouth 2 (two) times daily with a meal.   01/29/2018 at 800  . Difluprednate (DUREZOL) 0.05 % EMUL Apply 1 drop to eye 3 (three) times daily. Place 1 drop  into surgical eye   01/29/2018 at Unknown time  . diphenhydrAMINE (BENADRYL) 25 MG tablet Take 25 mg by mouth at bedtime.   01/29/2018 at Unknown time  . famotidine (PEPCID) 20 MG tablet Take 20 mg by mouth at bedtime.   01/29/2018 at Unknown time  . Fluticasone-Umeclidin-Vilant (TRELEGY ELLIPTA) 100-62.5-25 MCG/INH AEPB Inhale 1 puff into the lungs daily. 60 each 5 01/29/2018 at Unknown time  . guaiFENesin (MUCINEX) 600 MG 12 hr tablet Take 1 tablet (600 mg total) by mouth 2 (two) times daily. (Patient taking differently: Take 600 mg by mouth 2 (two) times daily as needed (for cough/congestion.). ) 60 tablet 1 01/29/2018 at Unknown time  . hydrochlorothiazide (HYDRODIURIL) 25 MG tablet Take 25 mg by mouth daily.    01/29/2018  at Unknown time  . ipratropium-albuterol (DUONEB) 0.5-2.5 (3) MG/3ML SOLN Take 3 mLs by nebulization 3 (three) times daily. (Patient taking differently: Take 3 mLs by nebulization 3 (three) times daily as needed (for wheezing/shortness of breath). ) 360 mL 5 01/29/2018 at Unknown time  . LORazepam (ATIVAN) 0.5 MG tablet Take 0.5 mg by mouth at bedtime.    01/29/2018 at Unknown time  . meloxicam (MOBIC) 15 MG tablet Take 15 mg by mouth daily.   01/18/2018 at Unknown time  . metFORMIN (GLUCOPHAGE-XR) 500 MG 24 hr tablet Take 1,000 mg by mouth 2 (two) times daily.   01/18/2018 at Unknown time  . montelukast (SINGULAIR) 10 MG tablet TAKE (1) TABLET BY MOUTH AT BEDTIME. 30 tablet 3 01/29/2018 at Unknown time  . NUCALA 100 MG SOLR INJECT 100MG  SUBCUTANEOUSLY EVERY 4 WEEKS (GIVEN AT PRESCRIBERS OFFICE) 1 each 11 Past Month at Unknown time  . pantoprazole (PROTONIX) 40 MG tablet Take 1 tablet (40 mg total) by mouth 2 (two) times daily before a meal. (Patient taking differently: Take 40 mg by mouth daily. ) 30 tablet 2 01/29/2018 at Unknown time  . pravastatin (PRAVACHOL) 10 MG tablet Take 10 mg by mouth at bedtime.    01/29/2018 at Unknown time  . PROLENSA 0.07 % SOLN Apply 1 drop to eye. Place 1 drop into surgical eye   01/29/2018 at Unknown time  . TRESIBA FLEXTOUCH 100 UNIT/ML SOPN FlexTouch Pen INJECT 40 UNITS INTO THE SKIN DAILY AT 10 P.M. (Patient taking differently: Inject 40 Units into the skin daily at 10 pm. ) 15 mL 2 01/18/2018 at Unknown time  . VENTOLIN HFA 108 (90 Base) MCG/ACT inhaler Inhale 2 puffs into the lungs every 4 (four) hours as needed for wheezing or shortness of breath.    01/19/2018 at 0700  . VICTOZA 18 MG/3ML SOPN Inject 1.2 mg into the skin daily at 8 pm. BETWEEN 1900 & 2100   01/18/2018 at Unknown time  . Polyethyl Glycol-Propyl Glycol (LUBRICANT EYE DROPS) 0.4-0.3 % SOLN Place 1 drop into both eyes 3 (three) times daily as needed (dry eyes).     . triamcinolone ointment (KENALOG) 0.5 % Apply  twice daily as needed. (Patient not taking: Reported on 12/02/2017) 30 g 1 Not Taking at Unknown time   Scheduled: . aspirin EC  81 mg Oral Q M,W,F  . Bromfenac Sodium  1 drop Ophthalmic Daily  . carvedilol  25 mg Oral BID WC  . Difluprednate  1 drop Ophthalmic TID  . enoxaparin (LOVENOX) injection  40 mg Subcutaneous Q24H  . famotidine  20 mg Oral QHS  . fluconazole  100 mg Oral Daily  . fluticasone furoate-vilanterol  1 puff Inhalation Daily  .  guaiFENesin  600 mg Oral BID  . hydrochlorothiazide  25 mg Oral Daily  . insulin aspart  0-20 Units Subcutaneous TID WC  . insulin aspart  0-5 Units Subcutaneous QHS  . insulin glargine  30 Units Subcutaneous QHS  . ipratropium-albuterol  3 mL Nebulization Q4H  . levofloxacin  500 mg Oral Daily  . LORazepam  0.5 mg Oral QHS  . montelukast  10 mg Oral QHS  . pantoprazole  40 mg Oral Daily  . pravastatin  10 mg Oral QHS  . predniSONE  40 mg Oral Q breakfast  . sodium chloride flush  3 mL Intravenous Q12H  . sodium chloride flush  3 mL Intravenous Q12H  . umeclidinium bromide  1 puff Inhalation Daily   Continuous: . sodium chloride     AJL:UNGBMB chloride, albuterol, ondansetron **OR** ondansetron (ZOFRAN) IV, polyvinyl alcohol, sodium chloride flush  Assesment: She was admitted with asthmatic bronchitis.  She has diabetes and that is been worse with her IV steroids.  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  She is much better.  She is ready for discharge. Active Problems:   Asthmatic bronchitis   DM type 2 causing vascular disease (HCC)   GERD (gastroesophageal reflux disease)   Mixed hyperlipidemia   Asthma exacerbation   Hypogammaglobulinemia ([place])    Plan: Continue current treatments.  Discharge home today    LOS: 4 days   Antonieta Slaven L 02/01/2018, 10:04 AM

## 2018-02-01 NOTE — Progress Notes (Signed)
Pt discharged home today per Dr. Hawkins. Pt's IV site D/C'd and WDL. Pt's VSS. Pt provided with home medication list, discharge instructions and prescriptions. Verbalized understanding. Pt left floor via WC in stable condition accompanied by NT. 

## 2018-02-01 NOTE — Discharge Summary (Signed)
Physician Discharge Summary  Patient ID: LILLION ELBERT MRN: [id number] DOB/AGE: 07/22/1955 62 y.o. Primary Care Physician:Fagan, Carloyn Manner, MD Admit date: 01/28/2018 Discharge date: 02/01/2018    Discharge Diagnoses:   Active Problems:   Asthmatic bronchitis   DM type 2 causing vascular disease (HCC)   GERD (gastroesophageal reflux disease)   Mixed hyperlipidemia   Asthma exacerbation   Hypogammaglobulinemia ([place]) Hypertension Recent cataract surgery Anxiety  Allergies as of 02/01/2018      Reactions   Sulfa Antibiotics Anaphylaxis      Medication List    TAKE these medications   aspirin EC 81 MG tablet Take 81 mg by mouth every Monday, Wednesday, and Friday. AT NIGHT   carvedilol 25 MG tablet Commonly known as:  COREG Take 25 mg by mouth 2 (two) times daily with a meal.   diphenhydrAMINE 25 MG tablet Commonly known as:  BENADRYL Take 25 mg by mouth at bedtime.   DUREZOL 0.05 % Emul Generic drug:  Difluprednate Apply 1 drop to eye 3 (three) times daily. Place 1 drop into surgical eye   famotidine 20 MG tablet Commonly known as:  PEPCID Take 20 mg by mouth at bedtime.   Fluticasone-Umeclidin-Vilant 100-62.5-25 MCG/INH Aepb Inhale 1 puff into the lungs daily.   guaiFENesin 600 MG 12 hr tablet Commonly known as:  MUCINEX Take 1 tablet (600 mg total) by mouth 2 (two) times daily. What changed:    when to take this  reasons to take this   hydrochlorothiazide 25 MG tablet Commonly known as:  HYDRODIURIL Take 25 mg by mouth daily.   ipratropium-albuterol 0.5-2.5 (3) MG/3ML Soln Commonly known as:  DUONEB Take 3 mLs by nebulization 3 (three) times daily. What changed:    when to take this  reasons to take this   levofloxacin 500 MG tablet Commonly known as:  LEVAQUIN Take 1 tablet (500 mg total) by mouth daily. Start taking on:  02/02/2018   LORazepam 0.5 MG tablet Commonly known as:  ATIVAN Take 0.5 mg by mouth at bedtime.   LUBRICANT EYE DROPS  0.4-0.3 % Soln Generic drug:  Polyethyl Glycol-Propyl Glycol Place 1 drop into both eyes 3 (three) times daily as needed (dry eyes).   meloxicam 15 MG tablet Commonly known as:  MOBIC Take 15 mg by mouth daily.   metFORMIN 500 MG 24 hr tablet Commonly known as:  GLUCOPHAGE-XR Take 1,000 mg by mouth 2 (two) times daily.   montelukast 10 MG tablet Commonly known as:  SINGULAIR TAKE (1) TABLET BY MOUTH AT BEDTIME.   NUCALA 100 MG Solr Generic drug:  Mepolizumab INJECT 100MG  SUBCUTANEOUSLY EVERY 4 WEEKS (GIVEN AT PRESCRIBERS OFFICE)   pantoprazole 40 MG tablet Commonly known as:  PROTONIX Take 1 tablet (40 mg total) by mouth 2 (two) times daily before a meal. What changed:  when to take this   pravastatin 10 MG tablet Commonly known as:  PRAVACHOL Take 10 mg by mouth at bedtime.   PROLENSA 0.07 % Soln Generic drug:  Bromfenac Sodium Apply 1 drop to eye. Place 1 drop into surgical eye   TRESIBA FLEXTOUCH 100 UNIT/ML Sopn FlexTouch Pen Generic drug:  insulin degludec INJECT 40 UNITS INTO THE SKIN DAILY AT 10 P.M. What changed:  See the new instructions.   triamcinolone ointment 0.5 % Commonly known as:  KENALOG Apply twice daily as needed.   VENTOLIN HFA 108 (90 Base) MCG/ACT inhaler Generic drug:  albuterol Inhale 2 puffs into the lungs every 4 (four) hours as  needed for wheezing or shortness of breath.   VICTOZA 18 MG/3ML Sopn Generic drug:  liraglutide Inject 1.2 mg into the skin daily at 8 pm. BETWEEN 1900 & 2100       Discharged Condition: Improved    Consults: None  Significant Diagnostic Studies: X-ray Chest Pa And Lateral  Result Date: 01/28/2018 CLINICAL DATA:  62 year old female with a history of cough and congestion EXAM: CHEST - 2 VIEW COMPARISON:  12/26/2016, 10/22/2016 FINDINGS: Cardiomediastinal silhouette unchanged in size and contour. No evidence of central vascular congestion. No pneumothorax or pleural effusion. Low lung volumes. Mildly  coarsened interstitial markings similar to prior. No confluent airspace disease. No displaced fracture.  Mild degenerative changes of the spine. IMPRESSION: Negative for acute cardiopulmonary disease Electronically Signed   By: Corrie Mckusick D.O.   On: 01/28/2018 12:53    Lab Results: Basic Metabolic Panel: No results for input(s): NA, K, CL, CO2, GLUCOSE, BUN, CREATININE, CALCIUM, MG, PHOS in the last 72 hours. Liver Function Tests: No results for input(s): AST, ALT, ALKPHOS, BILITOT, PROT, ALBUMIN in the last 72 hours.   CBC: No results for input(s): WBC, NEUTROABS, HGB, HCT, MCV, PLT in the last 72 hours.  No results found for this or any previous visit (from the past 240 hour(s)).   Hospital Course: This is a 62 year old who came to the hospital from my office.  She had been seen for 3 days with asthmatic bronchitis and she was not clearing so she was admitted for IV treatment.  She was started on intravenous steroids IV antibiotics and she improved.  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  She was continued on treatments.  She improved over the next several days to the point that she was ready for discharge.  Discharge Exam: Blood pressure 131/77, pulse 64, temperature 97.7 F (36.5 C), temperature source Oral, resp. rate 18, height 5\' 2"  (1.575 m), weight 72.5 kg, SpO2 98 %. She is awake and alert.  Her chest is clear.  Heart is regular without gallop.  Abdomen is soft.  Disposition: Discharge home.  She will take Levaquin.  She has prednisone at home and knows how to take it.  I will see her in about a week      Signed: Paden Kuras L   02/01/2018, 10:06 AM

## 2018-02-03 ENCOUNTER — Ambulatory Visit: Payer: 59

## 2018-02-10 ENCOUNTER — Ambulatory Visit: Payer: 59 | Admitting: Allergy & Immunology

## 2018-02-10 ENCOUNTER — Telehealth (INDEPENDENT_AMBULATORY_CARE_PROVIDER_SITE_OTHER): Payer: Self-pay | Admitting: *Deleted

## 2018-02-10 ENCOUNTER — Ambulatory Visit (INDEPENDENT_AMBULATORY_CARE_PROVIDER_SITE_OTHER): Payer: 59 | Admitting: *Deleted

## 2018-02-10 ENCOUNTER — Encounter (INDEPENDENT_AMBULATORY_CARE_PROVIDER_SITE_OTHER): Payer: Self-pay | Admitting: *Deleted

## 2018-02-10 DIAGNOSIS — J4550 Severe persistent asthma, uncomplicated: Secondary | ICD-10-CM

## 2018-02-10 NOTE — Telephone Encounter (Signed)
Patient needs suprep 

## 2018-02-11 MED ORDER — SUPREP BOWEL PREP KIT 17.5-3.13-1.6 GM/177ML PO SOLN: 1.0000 | Freq: Once | ORAL | 0 refills | Status: AC

## 2018-02-13 DIAGNOSIS — J4550 Severe persistent asthma, uncomplicated: Secondary | ICD-10-CM | POA: Diagnosis not present

## 2018-02-13 DIAGNOSIS — E119 Type 2 diabetes mellitus without complications: Secondary | ICD-10-CM | POA: Diagnosis not present

## 2018-02-13 DIAGNOSIS — I10 Essential (primary) hypertension: Secondary | ICD-10-CM | POA: Diagnosis not present

## 2018-02-17 ENCOUNTER — Encounter (HOSPITAL_COMMUNITY): Payer: Self-pay

## 2018-02-17 MED ORDER — FENTANYL CITRATE (PF) 100 MCG/2ML IJ SOLN: 25.0000 ug | INTRAMUSCULAR | Status: DC | PRN

## 2018-02-17 MED ORDER — HYDROCODONE-ACETAMINOPHEN 7.5-325 MG PO TABS: 1.0000 | ORAL_TABLET | Freq: Once | ORAL | Status: DC | PRN

## 2018-02-18 ENCOUNTER — Encounter (HOSPITAL_COMMUNITY)
Admission: RE | Admit: 2018-02-18 | Discharge: 2018-02-18 | Disposition: A | Discharge status: Home / Self Care | Payer: 59 | Source: Ambulatory Visit | Attending: Ophthalmology | Admitting: Ophthalmology

## 2018-02-23 ENCOUNTER — Encounter (HOSPITAL_COMMUNITY): Payer: Self-pay | Admitting: Anesthesiology

## 2018-02-23 ENCOUNTER — Ambulatory Visit (HOSPITAL_COMMUNITY)
Admission: RE | Admit: 2018-02-23 | Discharge: 2018-02-23 | Disposition: A | Discharge status: Home / Self Care | Payer: 59 | Source: Ambulatory Visit | Attending: Ophthalmology | Admitting: Ophthalmology

## 2018-02-23 ENCOUNTER — Ambulatory Visit (HOSPITAL_COMMUNITY): Payer: 59 | Admitting: Anesthesiology

## 2018-02-23 ENCOUNTER — Encounter (HOSPITAL_COMMUNITY)
Admission: RE | Disposition: A | Discharge status: Home / Self Care | Payer: Self-pay | Source: Ambulatory Visit | Attending: Ophthalmology

## 2018-02-23 DIAGNOSIS — Z86010 Personal history of colonic polyps: Secondary | ICD-10-CM

## 2018-02-23 DIAGNOSIS — E1151 Type 2 diabetes mellitus with diabetic peripheral angiopathy without gangrene: Secondary | ICD-10-CM | POA: Insufficient documentation

## 2018-02-23 DIAGNOSIS — H2511 Age-related nuclear cataract, right eye: Secondary | ICD-10-CM | POA: Diagnosis not present

## 2018-02-23 DIAGNOSIS — Z7982 Long term (current) use of aspirin: Secondary | ICD-10-CM | POA: Diagnosis not present

## 2018-02-23 DIAGNOSIS — Z79899 Other long term (current) drug therapy: Secondary | ICD-10-CM | POA: Insufficient documentation

## 2018-02-23 DIAGNOSIS — J449 Chronic obstructive pulmonary disease, unspecified: Secondary | ICD-10-CM | POA: Diagnosis not present

## 2018-02-23 DIAGNOSIS — K219 Gastro-esophageal reflux disease without esophagitis: Secondary | ICD-10-CM | POA: Insufficient documentation

## 2018-02-23 DIAGNOSIS — Z7984 Long term (current) use of oral hypoglycemic drugs: Secondary | ICD-10-CM | POA: Diagnosis not present

## 2018-02-23 DIAGNOSIS — I10 Essential (primary) hypertension: Secondary | ICD-10-CM | POA: Insufficient documentation

## 2018-02-23 DIAGNOSIS — H25811 Combined forms of age-related cataract, right eye: Secondary | ICD-10-CM | POA: Diagnosis not present

## 2018-02-23 HISTORY — PX: CATARACT EXTRACTION W/PHACO: SHX586

## 2018-02-23 LAB — GLUCOSE, CAPILLARY: Glucose-Capillary: 100 mg/dL — ABNORMAL HIGH (ref 70–99)

## 2018-02-23 SURGERY — PHACOEMULSIFICATION, CATARACT, WITH IOL INSERTION
Anesthesia: Monitor Anesthesia Care | Site: Eye | Laterality: Right

## 2018-02-23 MED ORDER — BSS IO SOLN
INTRAOCULAR | Status: DC | PRN
  Administered 2018-02-23: 15 mL via INTRAOCULAR

## 2018-02-23 MED ORDER — LIDOCAINE HCL 3.5 % OP GEL
1.0000 "application " | Freq: Once | OPHTHALMIC | Status: AC
  Administered 2018-02-23: 1 via OPHTHALMIC

## 2018-02-23 MED ORDER — LIDOCAINE HCL (PF) 1 % IJ SOLN
INTRAMUSCULAR | Status: DC | PRN
  Administered 2018-02-23: .6 mL

## 2018-02-23 MED ORDER — LACTATED RINGERS IV SOLN
INTRAVENOUS | Status: DC
  Administered 2018-02-23: 12:00:00 via INTRAVENOUS

## 2018-02-23 MED ORDER — PHENYLEPHRINE HCL 2.5 % OP SOLN
1.0000 [drp] | OPHTHALMIC | Status: AC
  Administered 2018-02-23 (×3): 1 [drp] via OPHTHALMIC

## 2018-02-23 MED ORDER — TETRACAINE HCL 0.5 % OP SOLN
1.0000 [drp] | OPHTHALMIC | Status: AC
  Administered 2018-02-23 (×3): 1 [drp] via OPHTHALMIC

## 2018-02-23 MED ORDER — PROVISC 10 MG/ML IO SOLN
INTRAOCULAR | Status: DC | PRN
  Administered 2018-02-23: 0.85 mL via INTRAOCULAR

## 2018-02-23 MED ORDER — NEOMYCIN-POLYMYXIN-DEXAMETH 3.5-10000-0.1 OP SUSP
OPHTHALMIC | Status: DC | PRN
  Administered 2018-02-23: 2 [drp] via OPHTHALMIC

## 2018-02-23 MED ORDER — MIDAZOLAM HCL 5 MG/5ML IJ SOLN
INTRAMUSCULAR | Status: DC | PRN
  Administered 2018-02-23: 2 mg via INTRAVENOUS

## 2018-02-23 MED ORDER — POVIDONE-IODINE 5 % OP SOLN
OPHTHALMIC | Status: DC | PRN
  Administered 2018-02-23: 1 via OPHTHALMIC

## 2018-02-23 MED ORDER — EPINEPHRINE PF 1 MG/ML IJ SOLN
INTRAOCULAR | Status: DC | PRN
  Administered 2018-02-23: 500 mL

## 2018-02-23 MED ORDER — CYCLOPENTOLATE-PHENYLEPHRINE 0.2-1 % OP SOLN
1.0000 [drp] | OPHTHALMIC | Status: AC
  Administered 2018-02-23 (×3): 1 [drp] via OPHTHALMIC

## 2018-02-23 MED ORDER — EPINEPHRINE PF 1 MG/ML IJ SOLN
INTRAMUSCULAR | Status: AC
  Filled 2018-02-23: qty 1

## 2018-02-23 MED ORDER — MIDAZOLAM HCL 2 MG/2ML IJ SOLN
INTRAMUSCULAR | Status: AC
  Filled 2018-02-23: qty 2

## 2018-02-23 SURGICAL SUPPLY — 12 items
CLOTH BEACON ORANGE TIMEOUT ST (SAFETY) ×1 IMPLANT
EYE SHIELD UNIVERSAL CLEAR (GAUZE/BANDAGES/DRESSINGS) ×1 IMPLANT
GLOVE BIOGEL PI IND STRL 7.0 (GLOVE) IMPLANT
GLOVE BIOGEL PI INDICATOR 7.0 (GLOVE) ×2
LENS ALC ACRYL/TECN (Ophthalmic Related) ×1 IMPLANT
PAD ARMBOARD 7.5X6 YLW CONV (MISCELLANEOUS) ×1 IMPLANT
PROC W SPEC LENS (INTRAOCULAR LENS)
PROCESS W SPEC LENS (INTRAOCULAR LENS) IMPLANT
SYRINGE LUER LOK 1CC (MISCELLANEOUS) ×1 IMPLANT
TAPE SURG TRANSPORE 1 IN (GAUZE/BANDAGES/DRESSINGS) IMPLANT
TAPE SURGICAL TRANSPORE 1 IN (GAUZE/BANDAGES/DRESSINGS) ×1
WATER STERILE IRR 250ML POUR (IV SOLUTION) ×1 IMPLANT

## 2018-02-23 NOTE — Discharge Instructions (Signed)

## 2018-02-23 NOTE — Transfer of Care (Signed)
Immediate Anesthesia Transfer of Care Note  Patient: Kathy Tran  Procedure(s) Performed: CATARACT EXTRACTION PHACO AND INTRAOCULAR LENS PLACEMENT (IOC) (Right Eye)  Patient Location: Short Stay  Anesthesia Type:MAC  Level of Consciousness: awake  Airway & Oxygen Therapy: Patient Spontanous Breathing  Post-op Assessment: Report given to RN  Post vital signs: Reviewed  Last Vitals:  Vitals Value Taken Time  BP    Temp    Pulse    Resp    SpO2      Last Pain:  Vitals:   02/23/18 1108  PainSc: 0-No pain         Complications: No apparent anesthesia complications

## 2018-02-23 NOTE — Anesthesia Preprocedure Evaluation (Signed)
Anesthesia Evaluation  Patient identified by MRN, date of birth, ID band Patient awake    Reviewed: Allergy & Precautions, NPO status , Patient's Chart, lab work & pertinent test results, reviewed documented beta blocker date and time   Airway Mallampati: II  TM Distance: >3 FB Neck ROM: Full    Dental no notable dental hx. (+) Teeth Intact   Pulmonary neg pulmonary ROS, asthma , COPD,  COPD inhaler, Recent URI , Resolved,  Uses mult meds -clear today   Pulmonary exam normal breath sounds clear to auscultation       Cardiovascular Exercise Tolerance: Good hypertension, Pt. on medications and Pt. on home beta blockers + Peripheral Vascular Disease  Normal cardiovascular examI+ Valvular Problems/Murmurs  Rhythm:Regular Rate:Normal     Neuro/Psych negative neurological ROS  negative psych ROS   GI/Hepatic negative GI ROS, Neg liver ROS, GERD  Medicated and Controlled,  Endo/Other  negative endocrine ROSdiabetes  Renal/GU Renal diseasenegative Renal ROS  negative genitourinary   Musculoskeletal negative musculoskeletal ROS (+)   Abdominal   Peds negative pediatric ROS (+)  Hematology negative hematology ROS (+)   Anesthesia Other Findings   Reproductive/Obstetrics negative OB ROS                             Anesthesia Physical  Anesthesia Plan  ASA: II  Anesthesia Plan: MAC   Post-op Pain Management:    Induction:   PONV Risk Score and Plan:   Airway Management Planned: Nasal Cannula  Additional Equipment:   Intra-op Plan:   Post-operative Plan:   Informed Consent: I have reviewed the patients History and Physical, chart, labs and discussed the procedure including the risks, benefits and alternatives for the proposed anesthesia with the patient or authorized representative who has indicated his/her understanding and acceptance.     Plan Discussed with:   Anesthesia Plan  Comments:         Anesthesia Quick Evaluation

## 2018-02-23 NOTE — H&P (Signed)
I have reviewed the H&P, the patient was re-examined, and I have identified no interval changes in medical condition and plan of care since the history and physical of record  

## 2018-02-23 NOTE — Op Note (Signed)
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  Assistants: None  Anesthesia: Topical with MAC  Indications: Painless, progressive loss of vision with compromise of daily activities.  Surgery: Cataract Extraction with Intraocular lens Implant Right Eye  Discription: The patient had dilating drops and viscous lidocaine placed into the Right eye in the pre-op holding area. After transfer to the operating room, a time out was performed. The patient was then prepped and draped. Beginning with a 68m blade a paracentesis port was made at the surgeon's 2 o'clock position. The anterior chamber was then filled with 1% non-preserved lidocaine. This was followed by filling the anterior chamber with Provisc.  A 2.423mkeratome blade was used to make a clear corneal incision at the temporal limbus.  A bent cystatome needle was used to create a continuous tear capsulotomy. Hydrodissection was performed with balanced salt solution on a Fine canula. The lens nucleus was then removed using the phacoemulsification handpiece. Residual cortex was removed with the I&A handpiece. The anterior chamber and capsular bag were refilled with Provisc. A posterior chamber intraocular lens was placed into the capsular bag with it's injector. The implant was positioned with the Kuglan hook. The Provisc was then removed from the anterior chamber and capsular bag with the I&A handpiece. Stromal hydration of the main incision and paracentesis port was performed with BSS on a Fine canula. The wounds were tested for leak which was negative. The patient tolerated the procedure well. There were no operative complications. The patient was then transferred to the recovery room in stable condition.  Complications: None  Specimen: None  EBL: None  Prosthetic device: [employer] Technis, PCB00, power 22.5,  SN [id number]

## 2018-02-23 NOTE — Anesthesia Postprocedure Evaluation (Signed)
Anesthesia Post Note  Patient: Kathy Tran  Procedure(s) Performed: CATARACT EXTRACTION PHACO AND INTRAOCULAR LENS PLACEMENT (IOC) (Right Eye)  Patient location during evaluation: Short Stay Anesthesia Type: MAC Level of consciousness: awake and alert and oriented Pain management: pain level controlled Vital Signs Assessment: post-procedure vital signs reviewed and stable Respiratory status: spontaneous breathing Cardiovascular status: blood pressure returned to baseline Postop Assessment: no apparent nausea or vomiting Anesthetic complications: no     Last Vitals:  Vitals:   02/23/18 1108  BP: 118/72  Pulse: 80  Resp: 16  Temp: 36.7 C  SpO2: 98%    Last Pain:  Vitals:   02/23/18 1108  PainSc: 0-No pain                 Selby Slovacek

## 2018-02-24 ENCOUNTER — Encounter (HOSPITAL_COMMUNITY): Payer: Self-pay | Admitting: Ophthalmology

## 2018-02-27 ENCOUNTER — Encounter: Payer: Self-pay | Admitting: Allergy & Immunology

## 2018-02-27 ENCOUNTER — Ambulatory Visit (INDEPENDENT_AMBULATORY_CARE_PROVIDER_SITE_OTHER): Payer: 59 | Admitting: Allergy & Immunology

## 2018-02-27 VITALS — BP 130/82 | HR 84 | Temp 98.1°F | Resp 18

## 2018-02-27 DIAGNOSIS — D808 Other immunodeficiencies with predominantly antibody defects: Secondary | ICD-10-CM | POA: Insufficient documentation

## 2018-02-27 DIAGNOSIS — J310 Chronic rhinitis: Secondary | ICD-10-CM | POA: Diagnosis not present

## 2018-02-27 DIAGNOSIS — B999 Unspecified infectious disease: Secondary | ICD-10-CM

## 2018-02-27 DIAGNOSIS — J4550 Severe persistent asthma, uncomplicated: Secondary | ICD-10-CM | POA: Diagnosis not present

## 2018-02-27 DIAGNOSIS — D806 Antibody deficiency with near-normal immunoglobulins or with hyperimmunoglobulinemia: Secondary | ICD-10-CM

## 2018-02-27 MED ORDER — LEVOFLOXACIN 500 MG PO TABS: 500.0000 mg | ORAL_TABLET | Freq: Every day | ORAL | 0 refills | Status: AC

## 2018-02-27 NOTE — Patient Instructions (Addendum)
1. 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 - Daily controller medication(s): Trelegy one puff once in the morning + Singulair 10mg  daily + Nucala monthly - Rescue medications: ProAir 4 puffs every 4-6 hours as needed or DuoNeb nebulizer one vial every 4-6 hours as needed - Asthma control goals:  * Full participation in all desired activities (may need albuterol before activity) * Albuterol use two time or less a week on average (not counting use with activity) * Cough interfering with sleep two time or less a month * Oral steroids no more than once a year * No hospitalizations    2. Chronic rhinitis - with negative blood allergen testing - Continue with Zyrtec (cetirizine) 10 mg once daily.   4. Hypogammaglobulinemia - worsening infections since stopping IVIG in the spring.  - We will work on getting your IVIG approved again since you did better on this.  - With your current symptoms and time course, antibiotics might be needed: levofloxacin 500mg  twice daily for 10 days (printed off so you can fill it if needed) - Continue with nasal saline spray (i.e., Simply Saline) or nasal saline lavage (i.e., NeilMed) as needed prior to medicated nasal sprays. - Continue with guaifenesin [id number] mg (Mucinex) twice daily as needed for mucous thinning with adequate hydration to help it work.    5. Return in about 2 months (around 04/29/2018).   Please inform us of any Emergency Department visits, hospitalizations, or changes in symptoms. Call us before going to the ED for breathing or allergy symptoms since we might be able to fit you in for a sick visit. Feel free to contact us anytime with any questions, problems, or concerns.  It was a pleasure to see you again today!   Websites that have reliable patient information: 1. American Academy of Asthma, Allergy, and Immunology: www.aaaai.org 2. Food Allergy Research and Education  (FARE): foodallergy.org 3. Mothers of Asthmatics: http://www.asthmacommunitynetwork.org 4. American College of Allergy, Asthma, and Immunology: www.acaai.org

## 2018-02-27 NOTE — Progress Notes (Signed)
FOLLOW UP  Date of Service/Encounter:  02/27/18   Assessment:    Severe persistent asthmawithCOPD overlap - followed by Dr. 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 Dorris Fetch  GERD - on pantoprazole   Unfortunately, Kathy Tran has continued to worsen since stopping her immunoglobulin replacement.  She continues to get sinopulmonary infections that exacerbate her respiratory disease and unfortunately have caused her to end up in the hospital.  Given all of her complications over the summer, I think we need to go ahead and restart her immunoglobulin replacement.  While her IgG has rebounded to a normal range since stopping her prednisone, she technically still meets the criteria for specific antibody deficiency based on her pneumococcal titers from October 2018.  These were essentially postvaccination titers since she received a Pneumovax in July 2018.  Since she was only responsive to 1 out of 23 serotypes, she clearly fits the specific antibody deficiency phenotype.  Therefore, we will get her immunoglobulin approved based on this diagnosis.  She did tolerate Privigen on 2 occasions in the hospital without adverse event, so we will resume replacement with Privigen (600 mg/kg/month).  The patient is in agreement with this current plan.  Reinitiation of her immunoglobulin replacement can certainly be justified based on decreased hospitalizations as well as improved quality of life and increased patient productivity.  Plan/Recommendations:   1. 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 - Daily controller medication(s): Trelegy one puff once in the morning + Singulair 65m daily + Nucala monthly - Rescue  medications: ProAir 4 puffs every 4-6 hours as needed or DuoNeb nebulizer one vial every 4-6 hours as needed - Asthma control goals:  * Full participation in all desired activities (may need albuterol before activity) * Albuterol use two time or less a week on average (not counting use with activity) * Cough interfering with sleep two time or less a month * Oral steroids no more than once a year * No hospitalizations    2. Chronic rhinitis - with negative blood allergen testing - Continue with Zyrtec (cetirizine) 10 mg once daily.   4. Hypogammaglobulinemia - worsening infections since stopping IVIG in the spring.  - We will work on getting your IVIG approved again since you did better on this.  - With your current symptoms and time course, antibiotics might be needed: levofloxacin 5039mtwice daily for 10 days (printed off so you can fill it if needed) - Continue with nasal saline spray (i.e., Simply Saline) or nasal saline lavage (i.e., NeilMed) as needed prior to medicated nasal sprays. - Continue with guaifenesin [id number] mg (Mucinex) twice daily as needed for mucous thinning with adequate hydration to help it work.    5. Return in about 2 months (around 04/29/2018).   Total of 60 minutes, greater than 50% of which was spent in discussion of treatment and management options.    Subjective:   Kathy Tran a [age].o. female presenting today for follow up of  Chief Complaint  Patient presents with  . Follow-up    Hospital stay Dr. HaGarlan Fillersas a history of the following: Patient Active Problem List   Diagnosis Date Noted  . Specific antibody deficiency with normal  IG concentration and normal number of B cells (Kathy Tran) 02/27/2018  . Hx of colonic polyps 01/07/2018  . Postmenopausal 07/02/2017  . Hypogammaglobulinemia (Kathy Tran) 04/29/2017  . Recurrent infections 04/29/2017  . Non-allergic rhinitis 04/29/2017  . Severe persistent asthma without complication 38/75/6433   . Essential hypertension, benign 03/20/2017  . Asthma exacerbation 12/26/2016  . Bronchitis 09/25/2016  . Asthmatic bronchitis 07/18/2016  . DM type 2 causing vascular disease (Kathy Tran) 07/18/2016  . GERD (gastroesophageal reflux disease) 07/18/2016  . Mixed hyperlipidemia 07/18/2016  . History of colonic polyps 06/12/2016    History obtained from: chart review and patient.  Kathy Tran's Primary Care Provider is Asencion Noble, MD.     Kathy Tran is a 62 y.o. female presenting for a follow up visit.  She has a very complicated past medical history but is well-known to our clinic.  She was last seen in my clinic in July 2019.  At that time, she was actually doing very well with regards to her lung function.  She remained on Trelegy 1 puff once daily as well as Nucala monthly.  She remained off prednisone.  She had been off of her immunoglobulin replacement as well since around May 2019.  She was endorsing 3 to 4 days of worsening sore throat and left-sided ear pain.  We did give her a prescription for Augmentin to start just in case things worsen.  I also recommended the use of Mucinex.  In the interim, she was hospitalized for a few days for an episode of bronchitis which worsened into a severe asthma exacerbation.  She did not need a bronchoscopy at this time, but she was placed on a steroid taper.  She was also placed on antibiotics.  Since the last visit, she reports that she has been increasingly since last visit.  She just feels that she was doing a lot better when she was on the IVIG.  She did complete her antibiotics her recent hospitalization.  However, over the last 2 to 3 days, she has developed increased mucus production and a course her cough.  She has had no fever.  She remains on her Mucinex twice daily as well as DuoNeb 3 times daily and she uses her Acapella device twice daily.  She remains on her Trelegy as well as the Singulair and the mepolizumab monthly.  She continues to have nighttime  awakening due to shortness of breath, although over the last couple of days she has slight much better.  However, she is still not as good as she was she was stable in the spring.  Her Zyrtec 10 mg daily for her rhinitis.  She does have continued sinus pressure and postnasal drip.  She did feel slightly better after getting her 2 infusions of IVIG in the hospital.  She received Privigen on both occasions.  This was the same brand that she was getting up during her home health visits in the spring.  She developed a rash in May, which necessitated cessation of her IVIG.  We already had plans to stop her IVIG in the summer anyway.  However, since stopping she has developed worsening infections including another hospitalization.  Review of her initial work-up shows that her IgG was low when we first met her in October 2018.  At that time, we felt this was related to her chronic prednisone use.  Her IgA and IgM were both normal.  We did obtain pneumococcal titers which were only protective to 1 out of 23 serotypes.  We recommended getting a Pneumovax with repeat titers, but it turned out that she had received a Pneumovax in July 2018.  Therefore, we counted her Pneumovax titers as postvaccination titers and obtained approval for immunoglobulin based on these labs.  She is going to get a colonoscopy in the near future, as long as her pulmonary status remains stable.  Her activity level has been much lower and she spends more time sitting in her chair. Otherwise, there have been no changes to her past medical history, surgical history, family history, or social history.   Review of Systems: a 14-point review of systems is pertinent for what is mentioned in HPI.  Otherwise, all other systems were negative.  Constitutional: negative other than that listed in the HPI Eyes: negative other than that listed in the HPI Ears, nose, mouth, throat, and face: negative other than that listed in the HPI Respiratory:  negative other than that listed in the HPI Cardiovascular: negative other than that listed in the HPI Gastrointestinal: negative other than that listed in the HPI Genitourinary: negative other than that listed in the HPI Integument: negative other than that listed in the HPI Hematologic: negative other than that listed in the HPI Musculoskeletal: negative other than that listed in the HPI Neurological: negative other than that listed in the HPI Allergy/Immunologic: negative other than that listed in the HPI    Objective:   Blood pressure 130/82, pulse 84, temperature 98.1 F (36.7 C), temperature source Oral, resp. rate 18, SpO2 97 %. There is no height or weight on file to calculate BMI.   Physical Exam:  General: Alert, interactive, in no acute distress. Cushingoid facies. Appears more tired than normal.  Eyes: No conjunctival injection bilaterally, no discharge on the right, no discharge on the left and no Horner-Trantas dots present. PERRL bilaterally. EOMI without pain. No photophobia.  Ears: Right TM pearly gray with normal light reflex, Left TM pearly gray with normal light reflex, Right TM intact without perforation and Left TM intact without perforation.  Nose/Throat: External nose within normal limits and septum midline. Turbinates edematous with crusty discharge. Posterior oropharynx erythematous without cobblestoning in the posterior oropharynx. Tonsils 2+ without exudates.  Tongue without thrush. Lungs: Decreased breath sounds bilaterally without wheezing, rhonchi or rales. No increased work of breathing. Coarse upper airway noise throughout.  CV: Normal S1/S2. No murmurs. Capillary refill <2 seconds.  Skin: Warm and dry, without lesions or rashes. Neuro:   Grossly intact. No focal deficits appreciated. Responsive to questions.  Diagnostic studies:   Spirometry: results abnormal (FEV1: 1.65/65%, FVC: 2.07/60%, FEV1/FVC: 79%).    Spirometry consistent with possible  restrictive disease.   Allergy Studies: none       Salvatore Marvel, MD  [facility]

## 2018-03-02 ENCOUNTER — Telehealth (INDEPENDENT_AMBULATORY_CARE_PROVIDER_SITE_OTHER): Payer: Self-pay | Admitting: *Deleted

## 2018-03-02 NOTE — Telephone Encounter (Signed)
Referring MD/PCP: fagan   Procedure: tcs  Reason/Indication:  Hx polyps  Has patient had this procedure before?  Yes, 2012  If so, when, by whom and where?    Is there a family history of colon cancer?  no  Who?  What age when diagnosed?    Is patient diabetic?   yes      Does patient have prosthetic heart valve or mechanical valve?  no  Do you have a pacemaker?  no  Has patient ever had endocarditis? no  Has patient had joint replacement within last 12 months?  no  Is patient constipated or do they take laxatives? no  Does patient have a history of alcohol/drug use?  no  Is patient on blood thinner such as Coumadin, Plavix and/or Aspirin? yes  Medications: asa 81 mg daily, metformin 500 mg qid, carvedilol 25 mg bid, hctz 50 mg daily, Protonix 40 mg daily, meloxicam 15 mg daily, singulair 10 mg daily, pepcid 20 mg daily, Pravachol 10 mg daily, ativan 0.5 mg nightly, trelegg inhaler, tresiba 40 mg nightly, victoza 1.2 mg nightly, mucinex prn, cetrizine 10 mg daily, nebulizer , albuterol, ventolin  Allergies: sulfa  Medication Adjustment per Dr Lindi Adie, NP: asa 2 days. Don't take metformin evening before & morning of, decrease tresiba to 15 mg night before procedure, don't take victoza evening before procedure  Procedure date & time: 04/01/18 at 1030

## 2018-03-02 NOTE — Telephone Encounter (Signed)
agree

## 2018-03-04 DIAGNOSIS — J4550 Severe persistent asthma, uncomplicated: Secondary | ICD-10-CM | POA: Diagnosis not present

## 2018-03-11 ENCOUNTER — Ambulatory Visit (INDEPENDENT_AMBULATORY_CARE_PROVIDER_SITE_OTHER): Payer: 59 | Admitting: *Deleted

## 2018-03-11 ENCOUNTER — Ambulatory Visit: Payer: 59

## 2018-03-11 DIAGNOSIS — J4550 Severe persistent asthma, uncomplicated: Secondary | ICD-10-CM

## 2018-03-20 DIAGNOSIS — D839 Common variable immunodeficiency, unspecified: Secondary | ICD-10-CM | POA: Diagnosis not present

## 2018-03-24 ENCOUNTER — Telehealth: Payer: Self-pay | Admitting: *Deleted

## 2018-03-24 NOTE — Telephone Encounter (Signed)
Have kept check on pharmacy to get patient PRivigen restarted.  Her Ins. reapproved on 11/1 so I spoke to pharmacy and patient and she restarted infusion 11/8 with no problems.

## 2018-03-24 NOTE — Telephone Encounter (Signed)
Awesome. Thanks for the update!   Salvatore Marvel, MD [facility]

## 2018-03-26 DIAGNOSIS — Z87442 Personal history of urinary calculi: Secondary | ICD-10-CM | POA: Diagnosis not present

## 2018-03-27 DIAGNOSIS — E119 Type 2 diabetes mellitus without complications: Secondary | ICD-10-CM | POA: Diagnosis not present

## 2018-04-01 ENCOUNTER — Encounter (HOSPITAL_COMMUNITY)
Admission: RE | Disposition: A | Discharge status: Home / Self Care | Payer: Self-pay | Source: Ambulatory Visit | Attending: Internal Medicine

## 2018-04-01 ENCOUNTER — Other Ambulatory Visit: Payer: Self-pay

## 2018-04-01 ENCOUNTER — Ambulatory Visit (HOSPITAL_COMMUNITY)
Admission: RE | Admit: 2018-04-01 | Discharge: 2018-04-01 | Disposition: A | Discharge status: Home / Self Care | Payer: 59 | Source: Ambulatory Visit | Attending: Internal Medicine | Admitting: Internal Medicine

## 2018-04-01 DIAGNOSIS — Z7984 Long term (current) use of oral hypoglycemic drugs: Secondary | ICD-10-CM | POA: Insufficient documentation

## 2018-04-01 DIAGNOSIS — E119 Type 2 diabetes mellitus without complications: Secondary | ICD-10-CM | POA: Diagnosis not present

## 2018-04-01 DIAGNOSIS — Z7982 Long term (current) use of aspirin: Secondary | ICD-10-CM | POA: Diagnosis not present

## 2018-04-01 DIAGNOSIS — Z09 Encounter for follow-up examination after completed treatment for conditions other than malignant neoplasm: Secondary | ICD-10-CM | POA: Diagnosis not present

## 2018-04-01 DIAGNOSIS — Z882 Allergy status to sulfonamides status: Secondary | ICD-10-CM | POA: Diagnosis not present

## 2018-04-01 DIAGNOSIS — Z1211 Encounter for screening for malignant neoplasm of colon: Secondary | ICD-10-CM | POA: Insufficient documentation

## 2018-04-01 DIAGNOSIS — Z79899 Other long term (current) drug therapy: Secondary | ICD-10-CM | POA: Diagnosis not present

## 2018-04-01 DIAGNOSIS — J449 Chronic obstructive pulmonary disease, unspecified: Secondary | ICD-10-CM | POA: Diagnosis not present

## 2018-04-01 DIAGNOSIS — K644 Residual hemorrhoidal skin tags: Secondary | ICD-10-CM | POA: Diagnosis not present

## 2018-04-01 DIAGNOSIS — K635 Polyp of colon: Secondary | ICD-10-CM | POA: Diagnosis not present

## 2018-04-01 DIAGNOSIS — J4550 Severe persistent asthma, uncomplicated: Secondary | ICD-10-CM | POA: Diagnosis not present

## 2018-04-01 DIAGNOSIS — I10 Essential (primary) hypertension: Secondary | ICD-10-CM | POA: Diagnosis not present

## 2018-04-01 DIAGNOSIS — Z86010 Personal history of colonic polyps: Secondary | ICD-10-CM | POA: Diagnosis not present

## 2018-04-01 DIAGNOSIS — D124 Benign neoplasm of descending colon: Secondary | ICD-10-CM

## 2018-04-01 HISTORY — PX: BIOPSY: SHX5522

## 2018-04-01 HISTORY — PX: COLONOSCOPY: SHX5424

## 2018-04-01 LAB — GLUCOSE, CAPILLARY: GLUCOSE-CAPILLARY: 121 mg/dL — AB (ref 70–99)

## 2018-04-01 SURGERY — COLONOSCOPY
Anesthesia: Moderate Sedation

## 2018-04-01 MED ORDER — MIDAZOLAM HCL 5 MG/5ML IJ SOLN
INTRAMUSCULAR | Status: DC | PRN
  Administered 2018-04-01: 1 mg via INTRAVENOUS
  Administered 2018-04-01: 2 mg via INTRAVENOUS
  Administered 2018-04-01: 1 mg via INTRAVENOUS
  Administered 2018-04-01: 2 mg via INTRAVENOUS
  Administered 2018-04-01: 1 mg via INTRAVENOUS
  Administered 2018-04-01: 2 mg via INTRAVENOUS

## 2018-04-01 MED ORDER — MIDAZOLAM HCL 5 MG/5ML IJ SOLN
INTRAMUSCULAR | Status: AC
  Filled 2018-04-01: qty 10

## 2018-04-01 MED ORDER — SODIUM CHLORIDE 0.9 % IV SOLN
INTRAVENOUS | Status: DC
  Administered 2018-04-01: 1000 mL via INTRAVENOUS

## 2018-04-01 MED ORDER — MEPERIDINE HCL 50 MG/ML IJ SOLN
INTRAMUSCULAR | Status: AC
  Filled 2018-04-01: qty 1

## 2018-04-01 MED ORDER — MEPERIDINE HCL 50 MG/ML IJ SOLN
INTRAMUSCULAR | Status: DC | PRN
  Administered 2018-04-01 (×2): 25 mg

## 2018-04-01 NOTE — Op Note (Addendum)
[facility] Patient Name: Kathy Tran Procedure Date: 04/01/2018 9:53 AM MRN: [id number] Date of Birth: [birth year] Attending MD: Hildred Laser , MD CSN: [id number] Age: 62 Admit Type: Outpatient Procedure:                Colonoscopy Indications:              High risk colon cancer surveillance: Personal                            history of colonic polyps Providers:                Hildred Laser, MD, Janeece Riggers, RN, Randa Spike,                            Technician Referring MD:             Asencion Noble, MD Medicines:                Meperidine 50 mg IV, Midazolam 9 mg IV Complications:            No immediate complications. Estimated Blood Loss:     Estimated blood loss was minimal. Procedure:                Pre-Anesthesia Assessment:                           - Prior to the procedure, a History and Physical                            was performed, and patient medications and                            allergies were reviewed. The patient's tolerance of                            previous anesthesia was also reviewed. The risks                            and benefits of the procedure and the sedation                            options and risks were discussed with the patient.                            All questions were answered, and informed consent                            was obtained. Prior Anticoagulants: The patient                            last took aspirin 5 days and previous NSAID                            medication 2 days prior to the procedure. ASA Grade  Assessment: III - A patient with severe systemic                            disease. After reviewing the risks and benefits,                            the patient was deemed in satisfactory condition to                            undergo the procedure.                           After obtaining informed consent, the colonoscope                            was passed under direct  vision. Throughout the                            procedure, the patient's blood pressure, pulse, and                            oxygen saturations were monitored continuously. The                            [id number]-[id number] ([id number]) scope was introduced through                            the anus and advanced to the the cecum, identified                            by appendiceal orifice and ileocecal valve. The                            colonoscopy was technically difficult and complex                            due to a redundant colon. Successful completion of                            the procedure was aided by increasing the dose of                            sedation medication, changing the patient to a                            supine position, using manual pressure and                            withdrawing and reinserting the scope. The patient                            tolerated the procedure well. The quality of the  bowel preparation was excellent. The ileocecal                            valve, appendiceal orifice, and rectum were                            photographed. Scope In: 10:21:40 AM Scope Out: 10:58:52 AM Scope Withdrawal Time: 0 hours 12 minutes 48 seconds  Total Procedure Duration: 0 hours 37 minutes 12 seconds  Findings:      The perianal and digital rectal examinations were normal.      Two sessile polyps were found in the proximal descending colon. The       polyps were small in size. These were biopsied with a cold forceps for       histology. The pathology specimen was placed into Bottle Number 1.      External hemorrhoids were found during retroflexion. The hemorrhoids       were small. Impression:               - Two small polyps in the proximal descending                            colon. Biopsied.                           - External hemorrhoids. Moderate Sedation:      Moderate (conscious) sedation was administered by the  endoscopy nurse       and supervised by the endoscopist. The following parameters were       monitored: oxygen saturation, heart rate, blood pressure, CO2       capnography and response to care. Total physician intraservice time was       42 minutes. Recommendation:           - Patient has a contact number available for                            emergencies. The signs and symptoms of potential                            delayed complications were discussed with the                            patient. Return to normal activities tomorrow.                            Written discharge instructions were provided to the                            patient.                           - Resume previous diet today.                           - Continue present medications.                           -  No aspirin, ibuprofen, naproxen, or other                            non-steroidal anti-inflammatory drugs for 1 day.                           - Await pathology results.                           - Repeat colonoscopy is recommended. The                            colonoscopy date will be determined after pathology                            results from today's exam become available for                            review. Procedure Code(s):        --- Professional ---                           [phone], Colonoscopy, flexible; with biopsy, single                            or multiple                           99153, Moderate sedation; each additional 15                            minutes intraservice time                           99153, Moderate sedation; each additional 15                            minutes intraservice time                           G0500, Moderate sedation services provided by the                            same physician or other qualified health care                            professional performing a gastrointestinal                            endoscopic service that sedation  supports,                            requiring the presence of an independent trained                            observer to assist in the monitoring of the  patient's level of consciousness and physiological                            status; initial 15 minutes of intra-service time;                            patient age 73 years or older (additional time may                            be reported with [phone], as appropriate) Diagnosis Code(s):        --- Professional ---                           Z86.010, Personal history of colonic polyps                           D12.4, Benign neoplasm of descending colon                           K64.4, Residual hemorrhoidal skin tags CPT copyright 2018 American Medical Association. All rights reserved. The codes documented in this report are preliminary and upon coder review may  be revised to meet current compliance requirements. Hildred Laser, MD Hildred Laser, MD 04/01/2018 11:11:26 AM This report has been signed electronically. Number of Addenda: 0

## 2018-04-01 NOTE — Discharge Instructions (Signed)
No aspirin or NSAIDs for 24 hours. Resume other medications as before. Resume usual diet. No driving for 24 hours. Physician will call with biopsy results.      Colonoscopy, Adult, Care After This sheet gives you information about how to care for yourself after your procedure. Your doctor may also give you more specific instructions. If you have problems or questions, call your doctor. Follow these instructions at home: General instructions   For the first 24 hours after the procedure: ? Do not drive or use machinery. ? Do not sign important documents. ? Do not drink alcohol. ? Do your daily activities more slowly than normal. ? Eat foods that are soft and easy to digest. ? Rest often.  Take over-the-counter or prescription medicines only as told by your doctor.  It is up to you to get the results of your procedure. Ask your doctor, or the department performing the procedure, when your results will be ready. To help cramping and bloating:  Try walking around.  Put heat on your belly (abdomen) as told by your doctor. Use a heat source that your doctor recommends, such as a moist heat pack or a heating pad. ? Put a towel between your skin and the heat source. ? Leave the heat on for 20-30 minutes. ? Remove the heat if your skin turns bright red. This is especially important if you cannot feel pain, heat, or cold. You can get burned. Eating and drinking  Drink enough fluid to keep your pee (urine) clear or pale yellow.  Return to your normal diet as told by your doctor. Avoid heavy or fried foods that are hard to digest.  Avoid drinking alcohol for as long as told by your doctor. Contact a doctor if:  You have blood in your poop (stool) 2-3 days after the procedure. Get help right away if:  You have more than a small amount of blood in your poop.  You see large clumps of tissue (blood clots) in your poop.  Your belly is swollen.  You feel sick to your stomach  (nauseous).  You throw up (vomit).  You have a fever.  You have belly pain that gets worse, and medicine does not help your pain. This information is not intended to replace advice given to you by your health care provider. Make sure you discuss any questions you have with your health care provider. Document Released: 06/01/2010 Document Revised: 01/22/2016 Document Reviewed: 01/22/2016 Elsevier Interactive Patient Education  2017 [place].    Colon Polyps Polyps are tissue growths inside the body. Polyps can grow in many places, including the large intestine (colon). A polyp may be a round bump or a mushroom-shaped growth. You could have one polyp or several. Most colon polyps are noncancerous (benign). However, some colon polyps can become cancerous over time. What are the causes? The exact cause of colon polyps is not known. What increases the risk? This condition is more likely to develop in people who:  Have a family history of colon cancer or colon polyps.  Are older than 85 or older than 45 if they are African American.  Have inflammatory bowel disease, such as ulcerative colitis or Crohn disease.  Are overweight.  Smoke cigarettes.  Do not get enough exercise.  Drink too much alcohol.  Eat a diet that is: ? High in fat and red meat. ? Low in fiber.  Had childhood cancer that was treated with abdominal radiation.  What are the signs or symptoms?  Most polyps do not cause symptoms. If you have symptoms, they may include:  Blood coming from your rectum when having a bowel movement.  Blood in your stool.The stool may look dark red or black.  A change in bowel habits, such as constipation or diarrhea.  How is this diagnosed? This condition is diagnosed with a colonoscopy. This is a procedure that uses a lighted, flexible scope to look at the inside of your colon. How is this treated? Treatment for this condition involves removing any polyps that are found.  Those polyps will then be tested for cancer. If cancer is found, your health care provider will talk to you about options for colon cancer treatment. Follow these instructions at home: Diet  Eat plenty of fiber, such as fruits, vegetables, and whole grains.  Eat foods that are high in calcium and vitamin D, such as milk, cheese, yogurt, eggs, liver, fish, and broccoli.  Limit foods high in fat, red meats, and processed meats, such as hot dogs, sausage, bacon, and lunch meats.  Maintain a healthy weight, or lose weight if recommended by your health care provider. General instructions  Do not smoke cigarettes.  Do not drink alcohol excessively.  Keep all follow-up visits as told by your health care provider. This is important. This includes keeping regularly scheduled colonoscopies. Talk to your health care provider about when you need a colonoscopy.  Exercise every day or as told by your health care provider. Contact a health care provider if:  You have new or worsening bleeding during a bowel movement.  You have new or increased blood in your stool.  You have a change in bowel habits.  You unexpectedly lose weight. This information is not intended to replace advice given to you by your health care provider. Make sure you discuss any questions you have with your health care provider. Document Released: 01/24/2004 Document Revised: 10/05/2015 Document Reviewed: 03/20/2015 Elsevier Interactive Patient Education  [employer].

## 2018-04-01 NOTE — H&P (Signed)
Kathy Tran is an 62 y.o. female.   Chief Complaint: Patient is here for colonoscopy. HPI: Patient 62 year old Caucasian female with history of colonic polyps(sessile serrated polyp as well as tubular adenoma) who is here for surveillance colonoscopy.  Last exam was in November 2012.  She denies abdominal pain change in bowel habits or rectal bleeding. Family history is negative for CRC. Last aspirin dose was 5 days ago and meloxicam was 2 days ago.  Past Medical History:  Diagnosis Date  . Angio-edema   . Asthma    Flu march and May 2018  . COPD (chronic obstructive pulmonary disease) ([place])   . Diabetes mellitus type 2, controlled ([place])    x5 yrs  . Eczema       . Hypertension    x 5 yrs  . Recurrent upper respiratory infection (URI)   . Renal disorder    renal stones  . Urticaria     Past Surgical History:  Procedure Laterality Date  . ADENOIDECTOMY    . CATARACT EXTRACTION W/PHACO Left 01/19/2018   Procedure: CATARACT EXTRACTION PHACO AND INTRAOCULAR LENS PLACEMENT LEFT EYE;  Surgeon: Tonny Branch, MD;  Location: [facility];  Service: Ophthalmology;  Laterality: Left;  CDE: 7.52  . CATARACT EXTRACTION W/PHACO Right 02/23/2018   Procedure: CATARACT EXTRACTION PHACO AND INTRAOCULAR LENS PLACEMENT ([place]);  Surgeon: Tonny Branch, MD;  Location: [facility];  Service: Ophthalmology;  Laterality: Right;  CDE: 5.47  . [place]  . COLONOSCOPY  03/27/2011   Procedure: COLONOSCOPY;  Surgeon: Rogene Houston, MD;  Location: [facility];  Service: Endoscopy;  Laterality: N/A;  2:00  . CYSTOSCOPY W/ URETERAL STENT PLACEMENT Left 11/08/2015   Procedure: CYSTOSCOPY, URETEROSCOPY, WITH STONE BASKET EXTRACTION;  Surgeon: Raynelle Bring, MD;  Location: [facility];  Service: Urology;  Laterality: Left;  . CYSTOSCOPY/RETROGRADE/URETEROSCOPY/STONE EXTRACTION WITH BASKET Right 07/03/2015   Procedure: CYSTOSCOPY/RETROGRADE/URETEROSCOPY/STONE EXTRACTION WITH BASKET;  Surgeon: Irine Seal, MD;   Location: [facility];  Service: Urology;  Laterality: Right;  . ENDOMETRIAL ABLATION    . FLEXIBLE BRONCHOSCOPY Bilateral 10/31/2016   Procedure: FLEXIBLE BRONCHOSCOPY WITH PROPOFOL;  Surgeon: Sinda Du, MD;  [facility];  Service: Cardiopulmonary;  Laterality: Bilateral;  . FLEXIBLE BRONCHOSCOPY N/A 01/17/2017   Procedure: FLEXIBLE BRONCHOSCOPY WITH PROPOFOL;  Surgeon: Sinda Du, MD;  [facility];  Service: Cardiopulmonary;  Laterality: N/A;  . KIDNEY STONE SURGERY     multiple  . SHOULDER SURGERY    . SINOSCOPY    . TONSILLECTOMY    . TUBAL LIGATION  1991  . TYMPANOSTOMY TUBE PLACEMENT      Family History  Problem Relation Age of Onset  . COPD Mother   . Cancer Father    Social History:  reports that she has never smoked. She has never used smokeless tobacco. She reports that she drinks alcohol. She reports that she does not use drugs.  Allergies:  Allergies  Allergen Reactions  . Sulfa Antibiotics Anaphylaxis    Facility-Administered Medications Prior to Admission  Medication Dose Route Frequency Provider Last Rate Last Dose  . Mepolizumab SOLR 100 mg  100 mg Subcutaneous Q28 days [name], MD   100 mg at 03/11/18 1106   Medications Prior to Admission  Medication Sig Dispense Refill  . aspirin EC 81 MG tablet Take 81 mg by mouth every Monday, Wednesday, and Friday. AT NIGHT    . carvedilol (COREG) 25 MG tablet Take 25 mg by mouth 2 (  two) times daily with a meal.    . diphenhydrAMINE (BENADRYL) 25 MG tablet Take 25 mg by mouth at bedtime.    . famotidine (PEPCID) 20 MG tablet Take 20 mg by mouth at bedtime.    . Fluticasone-Umeclidin-Vilant (TRELEGY ELLIPTA) 100-62.5-25 MCG/INH AEPB Inhale 1 puff into the lungs daily. 60 each 5  . guaiFENesin (MUCINEX) 600 MG 12 hr tablet Take 1 tablet (600 mg total) by mouth 2 (two) times daily. 60 tablet 1  . hydrochlorothiazide (HYDRODIURIL) 25 MG tablet Take 25 mg by mouth daily.     Marland Kitchen  ipratropium-albuterol (DUONEB) 0.5-2.5 (3) MG/3ML SOLN Take 3 mLs by nebulization 3 (three) times daily. 360 mL 5  . LORazepam (ATIVAN) 0.5 MG tablet Take 0.5 mg by mouth at bedtime as needed for sleep.     . meloxicam (MOBIC) 15 MG tablet Take 15 mg by mouth daily as needed for pain.    . metFORMIN (GLUCOPHAGE-XR) 500 MG 24 hr tablet Take 1,000 mg by mouth 2 (two) times daily.    . montelukast (SINGULAIR) 10 MG tablet TAKE (1) TABLET BY MOUTH AT BEDTIME. (Patient taking differently: Take 10 mg by mouth at bedtime. ) 30 tablet 3  . NUCALA 100 MG SOLR INJECT 100MG SUBCUTANEOUSLY EVERY 4 WEEKS (GIVEN AT PRESCRIBERS OFFICE) (Patient taking differently: Inject 100 mg into the skin every 28 (twenty-eight) days. ) 1 each 11  . pantoprazole (PROTONIX) 40 MG tablet Take 1 tablet (40 mg total) by mouth 2 (two) times daily before a meal. (Patient taking differently: Take 40 mg by mouth daily. ) 30 tablet 2  . Polyethyl Glycol-Propyl Glycol (SYSTANE) 0.4-0.3 % SOLN Place 1 drop into the right eye 3 (three) times daily.    . pravastatin (PRAVACHOL) 10 MG tablet Take 10 mg by mouth at bedtime.     . TRESIBA FLEXTOUCH 100 UNIT/ML SOPN FlexTouch Pen INJECT 40 UNITS INTO THE SKIN DAILY AT 10 P.M. (Patient taking differently: Inject 40 Units into the skin daily at 10 pm. ) 15 mL 2  . VENTOLIN HFA 108 (90 Base) MCG/ACT inhaler Inhale 2 puffs into the lungs every 4 (four) hours as needed for wheezing or shortness of breath.     Marland Kitchen VICTOZA 18 MG/3ML SOPN Inject 1.2 mg into the skin daily at 8 pm. BETWEEN 1900 & 2100      No results found for this or any previous visit (from the past 48 hour(s)). No results found.  ROS  Blood pressure 139/81, pulse 89, temperature 97.9 F (36.6 C), temperature source Oral, resp. rate 13, height _0  (1.575 m), weight 70.8 kg, SpO2 100 %. Physical Exam  Constitutional: She appears well-developed and well-nourished.  HENT:  Mouth/Throat: Oropharynx is clear and moist.  Eyes:  Conjunctivae are normal. No scleral icterus.  Neck: No thyromegaly present.  Cardiovascular: Normal rate, regular rhythm and normal heart sounds.  No murmur heard. Respiratory: Effort normal and breath sounds normal.  GI:  Abdomen is symmetrical with Pfannenstiel scar.  Abdomen is soft and nontender with organomegaly or masses.  Musculoskeletal: She exhibits no edema.  Lymphadenopathy:    She has no cervical adenopathy.  Neurological: She is alert.  Skin: Skin is warm and dry.     Assessment/Plan History of colonic polyps. Surveillance colonoscopy  Hildred Laser, MD 04/01/2018, 10:11 AM

## 2018-04-03 DIAGNOSIS — E1159 Type 2 diabetes mellitus with other circulatory complications: Secondary | ICD-10-CM | POA: Diagnosis not present

## 2018-04-03 DIAGNOSIS — Z23 Encounter for immunization: Secondary | ICD-10-CM | POA: Diagnosis not present

## 2018-04-03 DIAGNOSIS — I10 Essential (primary) hypertension: Secondary | ICD-10-CM | POA: Diagnosis not present

## 2018-04-06 ENCOUNTER — Encounter (HOSPITAL_COMMUNITY): Payer: Self-pay | Admitting: Internal Medicine

## 2018-04-08 ENCOUNTER — Ambulatory Visit (INDEPENDENT_AMBULATORY_CARE_PROVIDER_SITE_OTHER): Payer: 59

## 2018-04-08 DIAGNOSIS — J4550 Severe persistent asthma, uncomplicated: Secondary | ICD-10-CM

## 2018-04-14 DIAGNOSIS — D839 Common variable immunodeficiency, unspecified: Secondary | ICD-10-CM | POA: Diagnosis not present

## 2018-04-21 DIAGNOSIS — R82994 Hypercalciuria: Secondary | ICD-10-CM | POA: Diagnosis not present

## 2018-04-21 DIAGNOSIS — I10 Essential (primary) hypertension: Secondary | ICD-10-CM | POA: Diagnosis not present

## 2018-04-21 DIAGNOSIS — N200 Calculus of kidney: Secondary | ICD-10-CM | POA: Diagnosis not present

## 2018-04-22 DIAGNOSIS — J4550 Severe persistent asthma, uncomplicated: Secondary | ICD-10-CM | POA: Diagnosis not present

## 2018-04-22 DIAGNOSIS — J0101 Acute recurrent maxillary sinusitis: Secondary | ICD-10-CM | POA: Diagnosis not present

## 2018-04-22 DIAGNOSIS — I10 Essential (primary) hypertension: Secondary | ICD-10-CM | POA: Diagnosis not present

## 2018-04-28 DIAGNOSIS — J4550 Severe persistent asthma, uncomplicated: Secondary | ICD-10-CM | POA: Diagnosis not present

## 2018-04-29 ENCOUNTER — Encounter: Payer: Self-pay | Admitting: Allergy & Immunology

## 2018-04-29 ENCOUNTER — Ambulatory Visit (INDEPENDENT_AMBULATORY_CARE_PROVIDER_SITE_OTHER): Payer: 59 | Admitting: Allergy & Immunology

## 2018-04-29 VITALS — BP 132/88 | HR 103 | Resp 14

## 2018-04-29 DIAGNOSIS — J310 Chronic rhinitis: Secondary | ICD-10-CM

## 2018-04-29 DIAGNOSIS — J4550 Severe persistent asthma, uncomplicated: Secondary | ICD-10-CM

## 2018-04-29 DIAGNOSIS — D808 Other immunodeficiencies with predominantly antibody defects: Secondary | ICD-10-CM

## 2018-04-29 DIAGNOSIS — R609 Edema, unspecified: Secondary | ICD-10-CM | POA: Diagnosis not present

## 2018-04-29 DIAGNOSIS — D806 Antibody deficiency with near-normal immunoglobulins or with hyperimmunoglobulinemia: Secondary | ICD-10-CM

## 2018-04-29 MED ORDER — FLUTICASONE-UMECLIDIN-VILANT 100-62.5-25 MCG/INH IN AEPB: 1.0000 | INHALATION_SPRAY | Freq: Every day | RESPIRATORY_TRACT | 5 refills | Status: DC

## 2018-04-29 NOTE — Progress Notes (Signed)
FOLLOW UP  Date of Service/Encounter:  04/29/18   Assessment:   Severe persistent asthma without complication   Non-allergic rhinitis  Specific antibody deficiency   [name]'s respiratory status seems to have stabilized, although it should be noted that she was recently placed on a course of Levaquin. Thankfully she seems to have avoided the need for a prednisone burst. She is back on the IVIG which hopefully should make it easier for her to tolerate the cold and flu season. She still does not have a diagnosis regarding her immunodeficiency, but regardless she needs to remain on it at this point in time after failing her withdrawal earlier this year. It should be noted that she has largely remained out of the hospital (at least with regards to respiratory problems), so she is clearly doing better in that regard.   We are going to continue with her pulmonary medications as well as Nucala monthly since this seems to to be controlling her symptoms fairly well. I did encourage her to continue with the pursuit of the disability claim, as she clearly warrants it. I am happy to help in any way that I can.    Plan/Recommendations:   1. 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  - We are not going to make any medication changes at this time. - Daily controller medication(s): Trelegy one puff once in the morning + Singulair 10mg  daily + Nucala monthly - Rescue medications: ProAir 4 puffs every 4-6 hours as needed or DuoNeb nebulizer one vial every 4-6 hours as needed - Asthma control goals:  * Full participation in all desired activities (may need albuterol before activity) * Albuterol use two time or less a week on average (not counting use with activity) * Cough interfering with sleep two time or less a month * Oral steroids no more than once a year * No hospitalizations    2. Chronic rhinitis - with negative blood allergen  testing - Continue with Zyrtec (cetirizine) 10 mg once daily.   4. 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  - Let us know if you need more information for your disability application. - We are happy to help you out with this.    5. Return in about 4 months (around 08/29/2018).  Subjective:   Kathy Tran is a 62 y.o. female presenting today for follow up of  Chief Complaint  Patient presents with  . Asthma    Kathy Tran has a history of the following: Patient Active Problem List   Diagnosis Date Noted  . 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 Hx of colonic polyps 01/07/2018  . Postmenopausal 07/02/2017  . Hypogammaglobulinemia ([place]) 04/29/2017  . Recurrent infections 04/29/2017  . Non-allergic rhinitis 04/29/2017  . Severe persistent asthma without complication 62/83/1517  . Essential hypertension, benign 03/20/2017  . Asthma exacerbation 12/26/2016  . Bronchitis 09/25/2016  . Asthmatic bronchitis 07/18/2016  . DM type 2 causing vascular disease ([place]) 07/18/2016  . GERD (gastroesophageal reflux disease) 07/18/2016  . Mixed hyperlipidemia 07/18/2016  . History of colonic polyps 06/12/2016    History obtained from: chart review and patient.  Kathy Tran's Primary Care Provider is Asencion Noble, MD.     Kathy Tran is a 62 y.o. female presenting for a follow up visit. She was last seen in October 2019. At that time, she had  recently been hospitalized for an asthma exacerbation with concurrent bronchitis. We made the decision to restart her immunoglobulin replacement at that time, and she has since received two infusions. We continued her on Trelegy one puff once daily as well as Singulair and Nucala. We also continued with cetirizine 10mg  once daily. We did treat her with a course of Levaquin as well.   Since the last visit, she has had a bad time. She went to see Dr. Luan Pulling and was placed  on an antibiotic in combination with Lasix. She is now retaining quite a bit of fluid. The addition of the Lasix has helped with this. She was "rattley" in her chest. She was placed on Levaquin. She does have an Augmentin script at home to have in case she gets worse over the [place].   Asthma/Respiratory Symptom History: She remains on all of her medications. However, she continues to have some problems with activities of daily living and SOB. Her ACT score today is 11, indicating poor asthma control. But she has not needed steroids since the last time that I saw her. She has not been to the ED or Urgent Care for any asthma problems. She is still distraught over not receiving disability. She is in the process of redoing the entirety of her claim for another submission. She is having problems at home with regards to having the stamina to put sheets on her bed or move from one end of the house to the other. She tells me today that she has lost hope that she will return to her previous self. She thinks that this is her "new normal".   Allergic Rhinitis Symptom History: She remains on the cetirizine daily. She does have Mucinex to use as needed.   Infectious Symptom History: She remains on the IVIG without a problem. She does have the home health nurse come out to start the IV. She has no problems with the infusion whatsoever.    Otherwise, there have been no changes to her past medical history, surgical history, family history, or social history.    Review of Systems: a 14-point review of systems is pertinent for what is mentioned in HPI.  Otherwise, all other systems were negative.  Constitutional: negative other than that listed in the HPI Eyes: negative other than that listed in the HPI Ears, nose, mouth, throat, and face: negative other than that listed in the HPI Respiratory: negative other than that listed in the HPI Cardiovascular: negative other than that listed in the  HPI Gastrointestinal: negative other than that listed in the HPI Genitourinary: negative other than that listed in the HPI Integument: negative other than that listed in the HPI Hematologic: negative other than that listed in the HPI Musculoskeletal: negative other than that listed in the HPI Neurological: negative other than that listed in the HPI Allergy/Immunologic: negative other than that listed in the HPI    Objective:   Blood pressure 132/88, pulse (!) 103, resp. rate 14, SpO2 94 %. There is no height or weight on file to calculate BMI.   Physical Exam:  General: Alert, interactive, in no acute distress. Short of breath at times. Surprisingly optimistic outlook.  Eyes: No conjunctival injection bilaterally, no discharge on the right, no discharge on the left and no Horner-Trantas dots present. PERRL bilaterally. EOMI without pain. No photophobia.  Ears: Right TM pearly gray with normal light reflex, Left TM pearly gray with normal light reflex, Right TM intact without perforation and Left TM  intact without perforation.  Nose/Throat: External nose within normal limits and septum midline. Turbinates edematous and pale with clear discharge. Posterior oropharynx mildly erythematous without cobblestoning in the posterior oropharynx. Tonsils 2+ without exudates.  Tongue without thrush. Lungs: Decreased breath sounds with expiratory wheezing bilaterally at the bases. No increased work of breathing. CV: Normal S1/S2. No murmurs. Capillary refill <2 seconds.  Skin: Warm and dry, without lesions or rashes. Neuro:   Grossly intact. No focal deficits appreciated. Responsive to questions.  Diagnostic studies:   Spirometry: results normal (FEV1: 1.65/80%, FVC: 2.06/73%, FEV1/FVC: 80%).    Spirometry consistent with possible restrictive disease. Overall stable compared to previous breathing tests.    Allergy Studies: none      Salvatore Marvel, MD  [facility] [place]

## 2018-04-29 NOTE — Patient Instructions (Addendum)
1. 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  - We are not going to make any medication changes at this time. - Daily controller medication(s): Trelegy one puff once in the morning + Singulair 10mg  daily + Nucala monthly - Rescue medications: ProAir 4 puffs every 4-6 hours as needed or DuoNeb nebulizer one vial every 4-6 hours as needed - Asthma control goals:  * Full participation in all desired activities (may need albuterol before activity) * Albuterol use two time or less a week on average (not counting use with activity) * Cough interfering with sleep two time or less a month * Oral steroids no more than once a year * No hospitalizations    2. Chronic rhinitis - with negative blood allergen testing - Continue with Zyrtec (cetirizine) 10 mg once daily.   4. 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  - Let us know if you need more information for your disability application. - We are happy to help you out with this.    5. Return in about 4 months (around 08/29/2018).   Please inform us of any Emergency Department visits, hospitalizations, or changes in symptoms. Call us before going to the ED for breathing or allergy symptoms since we might be able to fit you in for a sick visit. Feel free to contact us anytime with any questions, problems, or concerns.  It was a pleasure to see you again today!   Websites that have reliable patient information: 1. American Academy of Asthma, Allergy, and Immunology: www.aaaai.org 2. Food Allergy Research and Education (FARE): foodallergy.org 3. Mothers of Asthmatics: http://www.asthmacommunitynetwork.org 4. American College of Allergy, Asthma, and Immunology: www.acaai.org

## 2018-05-05 ENCOUNTER — Ambulatory Visit (INDEPENDENT_AMBULATORY_CARE_PROVIDER_SITE_OTHER): Payer: 59 | Admitting: *Deleted

## 2018-05-05 DIAGNOSIS — J4550 Severe persistent asthma, uncomplicated: Secondary | ICD-10-CM

## 2018-05-05 MED ORDER — MEPOLIZUMAB 100 MG ~~LOC~~ SOLR
100.0000 mg | SUBCUTANEOUS | Status: DC
  Administered 2018-05-05 – 2019-07-28 (×16): 100 mg via SUBCUTANEOUS

## 2018-05-08 ENCOUNTER — Ambulatory Visit: Payer: Self-pay

## 2018-05-11 DIAGNOSIS — I10 Essential (primary) hypertension: Secondary | ICD-10-CM | POA: Diagnosis not present

## 2018-05-11 DIAGNOSIS — J45909 Unspecified asthma, uncomplicated: Secondary | ICD-10-CM | POA: Diagnosis not present

## 2018-05-15 DIAGNOSIS — D839 Common variable immunodeficiency, unspecified: Secondary | ICD-10-CM | POA: Diagnosis not present

## 2018-05-26 DIAGNOSIS — E1165 Type 2 diabetes mellitus with hyperglycemia: Secondary | ICD-10-CM | POA: Diagnosis not present

## 2018-05-26 DIAGNOSIS — J111 Influenza due to unidentified influenza virus with other respiratory manifestations: Secondary | ICD-10-CM | POA: Diagnosis not present

## 2018-05-26 DIAGNOSIS — J4551 Severe persistent asthma with (acute) exacerbation: Secondary | ICD-10-CM | POA: Diagnosis not present

## 2018-05-26 DIAGNOSIS — J4550 Severe persistent asthma, uncomplicated: Secondary | ICD-10-CM | POA: Diagnosis not present

## 2018-06-03 ENCOUNTER — Ambulatory Visit: Payer: Self-pay

## 2018-06-04 ENCOUNTER — Other Ambulatory Visit: Payer: Self-pay | Admitting: Allergy & Immunology

## 2018-06-16 DIAGNOSIS — D839 Common variable immunodeficiency, unspecified: Secondary | ICD-10-CM | POA: Diagnosis not present

## 2018-06-17 ENCOUNTER — Ambulatory Visit (INDEPENDENT_AMBULATORY_CARE_PROVIDER_SITE_OTHER): Payer: 59

## 2018-06-17 DIAGNOSIS — J4550 Severe persistent asthma, uncomplicated: Secondary | ICD-10-CM | POA: Diagnosis not present

## 2018-06-17 DIAGNOSIS — D801 Nonfamilial hypogammaglobulinemia: Secondary | ICD-10-CM

## 2018-06-26 DIAGNOSIS — I10 Essential (primary) hypertension: Secondary | ICD-10-CM | POA: Diagnosis not present

## 2018-06-26 DIAGNOSIS — D839 Common variable immunodeficiency, unspecified: Secondary | ICD-10-CM | POA: Diagnosis not present

## 2018-06-26 DIAGNOSIS — J4550 Severe persistent asthma, uncomplicated: Secondary | ICD-10-CM | POA: Diagnosis not present

## 2018-07-02 ENCOUNTER — Telehealth: Payer: Self-pay | Admitting: *Deleted

## 2018-07-02 NOTE — Telephone Encounter (Signed)
Yes I definitely agree.  Kathy Marvel, MD [facility] [place]

## 2018-07-02 NOTE — Telephone Encounter (Signed)
We will try adding a 500 mL normal saline bolus before the IVIG.  If this continues to be a problem, maybe we can change to [facility]? It has a lower incidence of headaches. Thoughts on that? She was always anti subcutaneous immunoglobulin though.  Salvatore Marvel, MD [facility] [place]

## 2018-07-02 NOTE — Telephone Encounter (Signed)
  Dr Ernst Bowler please advise:  Email received 07/02/18 from [facility] that infuses patients IVIG Privigen 40 grams every 4 weeks.    Regarding Kathy Tran, DOB: Jul 28, 1955 Salvatore Marvel, MD)  Below is a note from Nursing please advise.    During a follow up call patient states that she occasionally has headaches that are tolerable after her infusions, nurse would advise patient to increase PO hydration. Patient is stating that the headaches are now bothersome despite the fact that she continues to hydrate. Patient does take Diphenhydramine 50mg  PO and Acetaminophen 650mg  PO prior to her infusions and at times will repeat the Acetaminophen later in the day. Caryl Pina, can you please reach out to the MD to see if what his thoughts are, maybe he would like to add hydration via PIV with the infusions.  Thank you,  Damaris Hippo, MSW, LCSW  Mobile: [phone]  Office: [phone]  Fax: [phone]

## 2018-07-02 NOTE — Telephone Encounter (Signed)
I will have them add hydration to order.  I dont believe from talking to patient previously that she will switch to SCIG

## 2018-07-13 DIAGNOSIS — J4550 Severe persistent asthma, uncomplicated: Secondary | ICD-10-CM | POA: Diagnosis not present

## 2018-07-13 DIAGNOSIS — D801 Nonfamilial hypogammaglobulinemia: Secondary | ICD-10-CM | POA: Diagnosis not present

## 2018-07-14 DIAGNOSIS — D839 Common variable immunodeficiency, unspecified: Secondary | ICD-10-CM | POA: Diagnosis not present

## 2018-07-14 LAB — IGG, IGA, IGM
IgA/Immunoglobulin A, Serum: 76 mg/dL — ABNORMAL LOW (ref 87–352)
IgG (Immunoglobin G), Serum: 838 mg/dL (ref 700–1600)
IgM (Immunoglobulin M), Srm: 50 mg/dL (ref 26–217)

## 2018-07-15 ENCOUNTER — Ambulatory Visit (INDEPENDENT_AMBULATORY_CARE_PROVIDER_SITE_OTHER): Payer: 59

## 2018-07-15 DIAGNOSIS — J4550 Severe persistent asthma, uncomplicated: Secondary | ICD-10-CM

## 2018-07-17 DIAGNOSIS — D839 Common variable immunodeficiency, unspecified: Secondary | ICD-10-CM | POA: Diagnosis not present

## 2018-07-25 ENCOUNTER — Other Ambulatory Visit: Payer: Self-pay | Admitting: Allergy & Immunology

## 2018-08-07 DIAGNOSIS — E119 Type 2 diabetes mellitus without complications: Secondary | ICD-10-CM | POA: Diagnosis not present

## 2018-08-07 DIAGNOSIS — J4550 Severe persistent asthma, uncomplicated: Secondary | ICD-10-CM | POA: Diagnosis not present

## 2018-08-07 DIAGNOSIS — I10 Essential (primary) hypertension: Secondary | ICD-10-CM | POA: Diagnosis not present

## 2018-08-12 ENCOUNTER — Ambulatory Visit: Payer: Self-pay

## 2018-08-12 DIAGNOSIS — D839 Common variable immunodeficiency, unspecified: Secondary | ICD-10-CM | POA: Diagnosis not present

## 2018-08-14 ENCOUNTER — Ambulatory Visit (INDEPENDENT_AMBULATORY_CARE_PROVIDER_SITE_OTHER): Payer: 59

## 2018-08-14 ENCOUNTER — Other Ambulatory Visit: Payer: Self-pay

## 2018-08-14 DIAGNOSIS — J4550 Severe persistent asthma, uncomplicated: Secondary | ICD-10-CM

## 2018-08-28 ENCOUNTER — Ambulatory Visit: Payer: 59 | Admitting: Allergy & Immunology

## 2018-09-02 DIAGNOSIS — J4550 Severe persistent asthma, uncomplicated: Secondary | ICD-10-CM | POA: Diagnosis not present

## 2018-09-03 ENCOUNTER — Other Ambulatory Visit: Payer: Self-pay | Admitting: Allergy & Immunology

## 2018-09-08 DIAGNOSIS — D839 Common variable immunodeficiency, unspecified: Secondary | ICD-10-CM | POA: Diagnosis not present

## 2018-09-09 ENCOUNTER — Ambulatory Visit: Payer: 59 | Admitting: Allergy & Immunology

## 2018-09-11 ENCOUNTER — Other Ambulatory Visit: Payer: Self-pay

## 2018-09-11 ENCOUNTER — Ambulatory Visit: Payer: Self-pay

## 2018-09-11 ENCOUNTER — Encounter: Payer: Self-pay | Admitting: Allergy & Immunology

## 2018-09-11 ENCOUNTER — Ambulatory Visit (INDEPENDENT_AMBULATORY_CARE_PROVIDER_SITE_OTHER): Payer: 59 | Admitting: Allergy & Immunology

## 2018-09-11 VITALS — BP 136/88 | HR 96 | Temp 98.3°F | Resp 16

## 2018-09-11 DIAGNOSIS — E1159 Type 2 diabetes mellitus with other circulatory complications: Secondary | ICD-10-CM | POA: Diagnosis not present

## 2018-09-11 DIAGNOSIS — J4550 Severe persistent asthma, uncomplicated: Secondary | ICD-10-CM

## 2018-09-11 DIAGNOSIS — D801 Nonfamilial hypogammaglobulinemia: Secondary | ICD-10-CM

## 2018-09-11 DIAGNOSIS — J310 Chronic rhinitis: Secondary | ICD-10-CM | POA: Diagnosis not present

## 2018-09-11 NOTE — Progress Notes (Signed)
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  She actually looks quite upbeat and energetic today, which is the best I have seen her since I have met her.  From a respiratory perspective, she seems to have done well.  She did have a 4 to 6-week period of difficulty breathing earlier in this calendar year apparently, which was managed by Dr. Luan Pulling.  However, she did not need to go into the hospital which is certainly an improvement for her.  We are going to continue her Trelegy 1 puff daily in combination with her mepolizumab monthly.  We are going to drop her Singulair today to see how she responds to that.  I did ask her to play close attention to her symptoms over the next week or 2 to see if the cessation of the Singulair changes her overall control.  We are going to continue with immunoglobulin replacement.  She has been very stable since restarting the IVIG last fall.  She did require some antibiotics earlier this calendar year, but compared to how she was before the IVIG she is currently doing much better.  We are going to check some labs the next time she gets IVIG, including an IgG trough as well as a complete blood count and complete metabolic panel.  She also tells me that she was going to have a hemoglobin A1 collected at some point, so we will add that on as well.  I would like to eventually ge genetic testing to more fully diagnose her immune deficiency.  We can consider doing that later in the year.    Plan/Recommendations:   1. 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 - However, symptomatically you are doing great.   - Let's try to stop the Singulair to see how things do with that.  - Daily controller medication(s): Trelegy one puff once in the morning + Nucala monthly - Rescue medications: ProAir 4 puffs every 4-6 hours as needed or DuoNeb nebulizer one vial every 4-6 hours as needed - Asthma control goals:  * Full participation in all desired activities (may need albuterol before activity) * Albuterol use two time or less a week on average (not counting use with activity) * Cough interfering with sleep two time or less a month * Oral steroids no more than once a year * No hospitalizations    2. Chronic non-allergic rhinitis - Continue with Zyrtec (cetirizine) 10 mg once daily.   - Continue with Benadryl 30m at night.   4. 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  - I am glad that the headaches have improved.    5. Return in about 4 months (around 01/12/2019).  Subjective:   Kathy Tran a 63y.o. female presenting today for follow up of  Chief Complaint  Patient presents with  . Asthma    LBeryle Quanthas a history of the following: Patient Active Problem List   Diagnosis Date Noted  . Specific antibody deficiency with normal IG concentration and normal number of B cells ([place] 02/27/2018  . Hx of colonic polyps 01/07/2018  . Postmenopausal 07/02/2017  .  Hypogammaglobulinemia ([place]) 04/29/2017  . Recurrent infections 04/29/2017  . Non-allergic rhinitis 04/29/2017  . Severe persistent asthma without complication 56/21/3086  . Essential hypertension, benign 03/20/2017  . Asthma exacerbation 12/26/2016  . Bronchitis 09/25/2016  . Asthmatic bronchitis 07/18/2016  . DM type 2 causing vascular disease ([place]) 07/18/2016  . GERD (gastroesophageal reflux disease) 07/18/2016  . Mixed hyperlipidemia 07/18/2016  . History of colonic polyps 06/12/2016    History obtained from: chart review and patient.  Kathy Tran is a 63 y.o. female presenting for a follow up visit.  [name] is well-known to our practice.  She was last seen in December 2019.  At that time, her lung function looked fairly good.  We did not make any medication changes and continued her on Trelegy 1 puff in the morning and Singulair 10 mg at night in combination with mepolizumab monthly.  This combination seems to be working well for her.  We continued her on Zyrtec 10 mg daily for her chronic rhinitis.  She was also recently restarted on IVIG and doing well on that.  We did work on her disability application and since that time she has been approved for disability.  Since the last visit, she has done well. She is trying to exercise more. She is now up to doing half a mile which is good for her. She thinks that this has helped with her oxygenation. She notes that her O2 has decreased to a low of 82%.  Now her baseline is 95% and above.  She feels that she is doing the best she has done since I first saw her over 2 years ago at this point.  She did have a rather prolonged illness earlier this calendar year in January or February.  She reports that she was on steroids including some steroid injections by Dr. Luan Pulling.  She was also placed on amoxicillin.  She tells me it was a flulike illness, but she was never tested for flu.  She did have chills as well as low-grade fevers.  Thankfully, she was able to avoid hospitalization.  Asthma/Respiratory Symptom History: She remains on Trelegy 1 puff once daily, Singulair 10 mg daily, and mepolizumab monthly.  She has done fairly well with this.  Aside from the steroids earlier this year, she has gone nearly 5 months without systemic steroids.  This is a huge improvement for her.  She is sleeping well at night without use of her rescue inhaler.  ACT score is 21 today, indicating excellent asthma control.  Non- Allergic Rhinitis Symptom History: She remains on cetirizine 10 mg daily.  She also uses Benadryl 25 mg at night, mostly for sleep.  She is not on a nasal  steroid spray.  Infectious Symptom History: She remains on her IVIG.  Her last infusion was earlier this week.  She has an Therapist, sports named Mechele Claude who does come out and start the IV every time.  She was having some debilitating headaches with the infusions, but since we added the normal saline bolus prior to the IVIG, she has not had a headache.  She is very happy with how well she is doing.  Her last immunoglobulins were collected and late February or early March of this year.  She has not had a recent metabolic panel or complete blood count.  Aside from the amoxicillin that was given earlier this year shortly after Christmas, she has not required any antibiotics.  She has not required any hospitalizations either.  She is very  pleased with how well she is doing.  Otherwise, there have been no changes to her past medical history, surgical history, family history, or social history.  Her husband is retired at this point as well.  In fact, he retired prior to her stopping her work.  However, he remains busy farming there over 100 acres of farm land.    Review of Systems  Constitutional: Negative.  Negative for chills, fever, malaise/fatigue and weight loss.  HENT: Negative.  Negative for congestion, ear discharge and ear pain.   Eyes: Negative for pain, discharge and redness.  Respiratory: Negative for cough, sputum production, shortness of breath and wheezing.   Cardiovascular: Negative.  Negative for chest pain and palpitations.  Gastrointestinal: Negative for abdominal pain and heartburn.  Skin: Negative.  Negative for itching and rash.  Neurological: Negative for dizziness and headaches.  Endo/Heme/Allergies: Negative for environmental allergies. Does not bruise/bleed easily.       Objective:   Blood pressure 136/88, pulse 96, temperature 98.3 F (36.8 C), temperature source Oral, resp. rate 16, SpO2 99 %. There is no height or weight on file to calculate BMI.   Physical Exam:  Physical Exam   Constitutional: She appears well-developed.  Pleasant female.  Very energetic.  HENT:  Head: Normocephalic and atraumatic.  Right Ear: Tympanic membrane, external ear and ear canal normal.  Left Ear: Tympanic membrane, external ear and ear canal normal.  Nose: Mucosal edema present. No rhinorrhea, nasal deformity or septal deviation. No epistaxis. Right sinus exhibits no maxillary sinus tenderness and no frontal sinus tenderness. Left sinus exhibits no maxillary sinus tenderness and no frontal sinus tenderness.  Mouth/Throat: Uvula is midline and oropharynx is clear and moist. Mucous membranes are not pale and not dry.  Eyes: Pupils are equal, round, and reactive to light. EOM are normal. Right eye exhibits no chemosis and no discharge. Left eye exhibits no chemosis and no discharge. Right conjunctiva is not injected. Left conjunctiva is not injected.  Cardiovascular: Normal rate, regular rhythm and normal heart sounds.  Respiratory: Effort normal and breath sounds normal. No accessory muscle usage. No tachypnea. No respiratory distress. She has no wheezes. She has no rhonchi. She has no rales. She exhibits no tenderness.  Moving air well in all lung fields.  Lymphadenopathy:    She has no cervical adenopathy.  Neurological: She is alert.  Skin: No abrasion, no petechiae and no rash noted. Rash is not papular, not vesicular and not urticarial. No erythema. No pallor.  Psychiatric: She has a normal mood and affect.     Diagnostic studies: none    Salvatore Marvel, MD  [facility]

## 2018-09-11 NOTE — Patient Instructions (Addendum)
1. 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 - However, symptomatically you are doing great.  - Let's try to stop the Singulair to see how things do with that.  - Daily controller medication(s): Trelegy one puff once in the morning + Nucala monthly - Rescue medications: ProAir 4 puffs every 4-6 hours as needed or DuoNeb nebulizer one vial every 4-6 hours as needed - Asthma control goals:  * Full participation in all desired activities (may need albuterol before activity) * Albuterol use two time or less a week on average (not counting use with activity) * Cough interfering with sleep two time or less a month * Oral steroids no more than once a year * No hospitalizations    2. Chronic non-allergic rhinitis - Continue with Zyrtec (cetirizine) 10 mg once daily.   - Continue with Benadryl 25mg  at night.   4. 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  - I am glad that the headaches have improved.    5. Return in about 4 months (around 01/12/2019).   Please inform us of any Emergency Department visits, hospitalizations, or changes in symptoms. Call us before going to the ED for breathing or allergy symptoms since we might be able to fit you in for a sick visit. Feel free to contact us anytime with any questions, problems, or concerns.  It was a pleasure to see you again today!   Websites that have reliable patient information: 1. American Academy of Asthma, Allergy, and Immunology: www.aaaai.org 2. Food Allergy Research and Education (FARE): foodallergy.org 3. Mothers of Asthmatics: http://www.asthmacommunitynetwork.org 4. American College of Allergy, Asthma, and Immunology: www.acaai.org

## 2018-09-21 DIAGNOSIS — L308 Other specified dermatitis: Secondary | ICD-10-CM | POA: Diagnosis not present

## 2018-09-21 DIAGNOSIS — L821 Other seborrheic keratosis: Secondary | ICD-10-CM | POA: Diagnosis not present

## 2018-09-21 DIAGNOSIS — L820 Inflamed seborrheic keratosis: Secondary | ICD-10-CM | POA: Diagnosis not present

## 2018-09-23 DIAGNOSIS — J4550 Severe persistent asthma, uncomplicated: Secondary | ICD-10-CM | POA: Diagnosis not present

## 2018-09-28 ENCOUNTER — Telehealth: Payer: Self-pay | Admitting: *Deleted

## 2018-09-28 NOTE — Telephone Encounter (Signed)
-----   Message from [place] sent at 09/28/2018  2:02 PM EDT ----- Regarding: Nucala appt Can someone reach out to patient and advise there her insurance will not allow shipment till the following week and will need to reschedule injection after 6/2. Tammy

## 2018-09-28 NOTE — Telephone Encounter (Signed)
Appointment has been moved to 10/14/2018 at 1:30. Patient is aware and verbalized understanding.

## 2018-09-29 ENCOUNTER — Other Ambulatory Visit: Payer: Self-pay | Admitting: Allergy & Immunology

## 2018-10-06 ENCOUNTER — Telehealth: Payer: Self-pay | Admitting: Allergy & Immunology

## 2018-10-06 MED ORDER — MONTELUKAST SODIUM 10 MG PO TABS: ORAL_TABLET | ORAL | 5 refills | Status: DC

## 2018-10-06 NOTE — Telephone Encounter (Signed)
Patient needs a refill on SINGULAIR sent into [facility]

## 2018-10-06 NOTE — Telephone Encounter (Signed)
Called and spoke to patient, she was seen by Dr. Ernst Bowler and she tried to go off of Montelukast however she can tell a difference and is wanting to go back on it. I informed her that refills have been sent in.

## 2018-10-08 ENCOUNTER — Other Ambulatory Visit (HOSPITAL_COMMUNITY)
Admission: AD | Admit: 2018-10-08 | Discharge: 2018-10-08 | Disposition: A | Discharge status: Home / Self Care | Payer: 59 | Source: Skilled Nursing Facility | Attending: Allergy & Immunology | Admitting: Allergy & Immunology

## 2018-10-08 DIAGNOSIS — E1159 Type 2 diabetes mellitus with other circulatory complications: Secondary | ICD-10-CM | POA: Insufficient documentation

## 2018-10-08 DIAGNOSIS — D801 Nonfamilial hypogammaglobulinemia: Secondary | ICD-10-CM | POA: Insufficient documentation

## 2018-10-08 LAB — COMPREHENSIVE METABOLIC PANEL
ALT: 23 U/L (ref 0–44)
AST: 20 U/L (ref 15–41)
Albumin: 3.9 g/dL (ref 3.5–5.0)
Alkaline Phosphatase: 60 U/L (ref 38–126)
Anion gap: 12 (ref 5–15)
BUN: 18 mg/dL (ref 6–20)
CO2: 26 mmol/L (ref 22–32)
Calcium: 9.1 mg/dL (ref 8.9–10.3)
Chloride: 101 mmol/L (ref 98–111)
Creatinine, Ser: 0.83 mg/dL (ref 0.44–1.00)
GFR calc Af Amer: >60 mL/min (ref >60)
GFR calc non Af Amer: >60 mL/min (ref >60)
Glucose, Bld: 256 mg/dL — ABNORMAL HIGH (ref 70–99)
Potassium: 4.9 mmol/L (ref 3.5–5.1)
Sodium: 139 mmol/L (ref 135–145)
Total Bilirubin: 0.7 mg/dL (ref 0.3–1.2)
Total Protein: 6.6 g/dL (ref 6.5–8.1)

## 2018-10-08 LAB — CBC WITH DIFFERENTIAL/PLATELET
Abs Immature Granulocytes: 0.01 10*3/uL (ref 0.00–0.07)
Basophils Absolute: 0 10*3/uL (ref 0.0–0.1)
Basophils Relative: 1 %
Eosinophils Absolute: 0.1 10*3/uL (ref 0.0–0.5)
Eosinophils Relative: 2 %
HCT: 39 % (ref 36.0–46.0)
Hemoglobin: 12.5 g/dL (ref 12.0–15.0)
Immature Granulocytes: 0 %
Lymphocytes Relative: 22 %
Lymphs Abs: 1 10*3/uL (ref 0.7–4.0)
MCH: 31.5 pg (ref 26.0–34.0)
MCHC: 32.1 g/dL (ref 30.0–36.0)
MCV: 98.2 fL (ref 80.0–100.0)
Monocytes Absolute: 0.3 10*3/uL (ref 0.1–1.0)
Monocytes Relative: 6 %
Neutro Abs: 3 10*3/uL (ref 1.7–7.7)
Neutrophils Relative %: 69 %
Platelets: 200 10*3/uL (ref 150–400)
RBC: 3.97 MIL/uL (ref 3.87–5.11)
RDW: 11.8 % (ref 11.5–15.5)
WBC: 4.4 10*3/uL (ref 4.0–10.5)
nRBC: 0 % (ref 0.0–0.2)

## 2018-10-08 LAB — HEMOGLOBIN A1C
Hgb A1c MFr Bld: 7.7 % — ABNORMAL HIGH (ref 4.8–5.6)
Mean Plasma Glucose: 174.29 mg/dL

## 2018-10-09 ENCOUNTER — Ambulatory Visit: Payer: Self-pay

## 2018-10-09 LAB — IGG: IgG (Immunoglobin G), Serum: 794 mg/dL (ref 586–1602)

## 2018-10-14 ENCOUNTER — Telehealth: Payer: Self-pay

## 2018-10-14 ENCOUNTER — Ambulatory Visit (INDEPENDENT_AMBULATORY_CARE_PROVIDER_SITE_OTHER): Payer: 59 | Admitting: Allergy & Immunology

## 2018-10-14 ENCOUNTER — Other Ambulatory Visit: Payer: Self-pay

## 2018-10-14 ENCOUNTER — Ambulatory Visit: Payer: 59

## 2018-10-14 ENCOUNTER — Encounter: Payer: Self-pay | Admitting: Allergy & Immunology

## 2018-10-14 VITALS — BP 136/78 | HR 82 | Temp 97.8°F | Resp 18

## 2018-10-14 DIAGNOSIS — D801 Nonfamilial hypogammaglobulinemia: Secondary | ICD-10-CM

## 2018-10-14 DIAGNOSIS — J4550 Severe persistent asthma, uncomplicated: Secondary | ICD-10-CM | POA: Diagnosis not present

## 2018-10-14 DIAGNOSIS — J310 Chronic rhinitis: Secondary | ICD-10-CM | POA: Diagnosis not present

## 2018-10-14 DIAGNOSIS — L239 Allergic contact dermatitis, unspecified cause: Secondary | ICD-10-CM | POA: Diagnosis not present

## 2018-10-14 MED ORDER — METHYLPREDNISOLONE ACETATE 80 MG/ML IJ SUSP
80.0000 mg | Freq: Once | INTRAMUSCULAR | Status: AC
  Administered 2018-10-14: 80 mg via INTRAMUSCULAR

## 2018-10-14 MED ORDER — CLOBETASOL PROPIONATE 0.05 % EX OINT: 1.0000 "application " | TOPICAL_OINTMENT | Freq: Two times a day (BID) | CUTANEOUS | 0 refills | Status: AC

## 2018-10-14 NOTE — Patient Instructions (Addendum)
1. Rash - ? dermatitis from sunscreen - DepoMedrol 80mg  given today to help calm the inflammation. - Add on clobetasol ointment twice daily to the worst spots for two weeks. - Continue with moisturizing twice daily.  - Use a different sunscreen that you have tolerated without a problem in the past.   2. 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 - However, symptomatically you are doing great.  - Daily controller medication(s): Trelegy one puff once in the morning + Nucala monthly - Rescue medications: ProAir 4 puffs every 4-6 hours as needed or DuoNeb nebulizer one vial every 4-6 hours as needed - Asthma control goals:  * Full participation in all desired activities (may need albuterol before activity) * Albuterol use two time or less a week on average (not counting use with activity) * Cough interfering with sleep two time or less a month * Oral steroids no more than once a year * No hospitalizations    3. Chronic non-allergic rhinitis - Continue with Zyrtec (cetirizine) 10 mg once daily.   - Continue with Benadryl 25mg  at night.   4. 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  - Continue with all of the premedications, including hydration.    5. Return in about 3 months (around 01/14/2019), as scheduled.   Please inform us of any Emergency Department visits, hospitalizations, or changes in symptoms. Call us before going to the ED for breathing or allergy symptoms since we might be able to fit you in for a sick visit. Feel free to contact us anytime with any questions, problems, or concerns.  It was a pleasure to see you again today!   Websites that have reliable patient information: 1. American Academy of Asthma, Allergy, and Immunology: www.aaaai.org 2. Food Allergy Research and Education (FARE): foodallergy.org 3. Mothers of Asthmatics:  http://www.asthmacommunitynetwork.org 4. American College of Allergy, Asthma, and Immunology: www.acaai.org

## 2018-10-14 NOTE — Progress Notes (Signed)
FOLLOW UP  Date of Service/Encounter:  10/14/18   Assessment:   Severe persistent asthma without complication - stable on Nucala monthly  Rash - ? 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 Rash - ? dermatitis from sunscreen - DepoMedrol 80mg  given today to help calm the inflammation. - Add on clobetasol ointment twice daily to the worst spots for two weeks. - Continue with moisturizing twice daily.  - Use a different sunscreen that you have tolerated without a problem in the past.   2. 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 - However, symptomatically you are doing great.  - Daily controller medication(s): Trelegy one puff once in the morning + Nucala monthly - Rescue medications: ProAir 4 puffs every 4-6 hours as needed or DuoNeb nebulizer one vial every 4-6 hours as needed - Asthma control goals:  * Full participation in all desired activities (may need albuterol before activity) * Albuterol use two time or less a week on average (not counting use with activity) * Cough interfering with sleep two time or less a month * Oral steroids no more than once a year * No hospitalizations    3. Chronic non-allergic rhinitis - Continue with Zyrtec (cetirizine) 10 mg once daily.   - Continue with Benadryl 25mg  at night.   4. 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  - Continue with all of the premedications, including hydration.    5. Return in about 3 months (around 01/14/2019), as scheduled.   Subjective:   Kathy Tran is a 63 y.o. female presenting today for follow up of  Chief Complaint  Patient presents with  . Rash    Kathy Tran has a history of the following: Patient Active Problem List   Diagnosis Date  Noted  . Specific antibody deficiency with normal IG concentration and normal number of B cells ([place]) 02/27/2018  . Hx of colonic polyps 01/07/2018  . Postmenopausal 07/02/2017  . Hypogammaglobulinemia ([place]) 04/29/2017  . Recurrent infections 04/29/2017  . Non-allergic rhinitis 04/29/2017  . Severe persistent asthma without complication 16/02/9603  . Essential hypertension, benign 03/20/2017  . Asthma exacerbation 12/26/2016  . Bronchitis 09/25/2016  . Asthmatic bronchitis 07/18/2016  . DM type 2 causing vascular disease (Kathy Tran) 07/18/2016  . GERD (gastroesophageal reflux disease) 07/18/2016  . Mixed hyperlipidemia 07/18/2016  . History of colonic polyps 06/12/2016    History obtained from: chart review and patient.  Kathy Tran is a 63 y.o. female presenting for a sick visit.  Kathy Tran is well-known to the practice.  She has a history of severe persistent asthma as well as specific antibody deficiency and nonallergic rhinitis.  We last saw her in May 2020.  At that time, she was symptomatically doing fantastic.  We continued Trelegy 1 puff in the morning and mepolizumab monthly.  She also has albuterol to use as needed.  For her nonallergic rhinitis, we continued Zyrtec 10 mg daily and Benadryl 25 mg at night.  Her IVIG infusions were going very well.  She was previously having headaches, but we added hydration with improvement of the headaches.  Since last visit, she has mostly done well.  She did have some asthma symptoms over the last week which were treated adequately with albuterol.  She did not require  the use of prednisone at all.  She remains on Trelegy as well as mepolizumab without any problems.  Her sleep is fairly good.  Her physical activity, like last time, continues to improve.  However, over the last week or so, she has developed a rash on her upper chest and shoulders.  This is exactly where she put Coppertone sunblock on.  She has tried using emollients without much improvement.   She has a triamcinolone prescription to use as needed as well. She is moisturizing twice daily. Of note, she had a similar reaction about one year ago when she used the same sun block. She has used other sun blocks without a problem, including [employer]. She does not think that this is associated with the IVIG infusions.   IVIG infusions continue to go well. She is getting her infusions with pre-hydration, which has decreased the incidence of headaches. However, she also gains around 4-5 pounds after each infusion which seems to decrease over the course of around one week. She is on Lasix every day from her PCP (Dr. Luan Tran).   Otherwise, there have been no changes to her past medical history, surgical history, family history, or social history.    Review of Systems  Constitutional: Negative.  Negative for fever, malaise/fatigue and weight loss.  HENT: Negative.  Negative for congestion, ear discharge and ear pain.   Eyes: Negative for pain, discharge and redness.  Respiratory: Negative for cough, sputum production, shortness of breath and wheezing.   Cardiovascular: Negative.  Negative for chest pain and palpitations.  Gastrointestinal: Negative for abdominal pain, heartburn and nausea.  Skin: Positive for itching and rash.  Neurological: Negative for dizziness and headaches.  Endo/Heme/Allergies: Negative for environmental allergies. Does not bruise/bleed easily.       Objective:   Blood pressure 136/78, pulse 82, temperature 97.8 F (36.6 C), temperature source Temporal, resp. rate 18, SpO2 97 %. There is no height or weight on file to calculate BMI.   Physical Exam:  Physical Exam  Constitutional: She appears well-developed.  HENT:  Head: Normocephalic and atraumatic.  Right Ear: Tympanic membrane, external ear and ear canal normal.  Left Ear: Tympanic membrane and ear canal normal.  Nose: No mucosal edema, rhinorrhea, nasal deformity or septal deviation. No epistaxis.  Right sinus exhibits no maxillary sinus tenderness and no frontal sinus tenderness. Left sinus exhibits no maxillary sinus tenderness and no frontal sinus tenderness.  Mouth/Throat: Uvula is midline and oropharynx is clear and moist. Mucous membranes are not pale and not dry.  Eyes: Pupils are equal, round, and reactive to light. Conjunctivae and EOM are normal. Right eye exhibits no chemosis and no discharge. Left eye exhibits no chemosis and no discharge. Right conjunctiva is not injected. Left conjunctiva is not injected.  Cardiovascular: Normal rate, regular rhythm and normal heart sounds.  Respiratory: Effort normal and breath sounds normal. No accessory muscle usage. No tachypnea. No respiratory distress. She has no wheezes. She has no rhonchi. She has no rales. She exhibits no tenderness.  Moving air well in all lung fields.  Lymphadenopathy:    She has no cervical adenopathy.  Neurological: She is alert.  Skin: No abrasion, no petechiae and no rash noted. Rash is not papular, not vesicular and not urticarial. No erythema. No pallor.  She does have raised papular lesions over her anterior chest and her shoulders.  There are excoriation marks present.  Psychiatric: She has a normal mood and affect.     Diagnostic studies:  none     Salvatore Marvel, MD  [facility]

## 2018-10-14 NOTE — Telephone Encounter (Signed)
Error

## 2018-10-21 ENCOUNTER — Other Ambulatory Visit (HOSPITAL_COMMUNITY): Payer: Self-pay | Admitting: Internal Medicine

## 2018-10-21 DIAGNOSIS — Z1231 Encounter for screening mammogram for malignant neoplasm of breast: Secondary | ICD-10-CM

## 2018-11-02 ENCOUNTER — Ambulatory Visit (HOSPITAL_COMMUNITY)
Admission: RE | Admit: 2018-11-02 | Discharge: 2018-11-02 | Disposition: A | Discharge status: Home / Self Care | Payer: 59 | Source: Ambulatory Visit | Attending: Internal Medicine | Admitting: Internal Medicine

## 2018-11-02 ENCOUNTER — Other Ambulatory Visit: Payer: Self-pay

## 2018-11-02 DIAGNOSIS — Z1231 Encounter for screening mammogram for malignant neoplasm of breast: Secondary | ICD-10-CM | POA: Insufficient documentation

## 2018-11-03 ENCOUNTER — Other Ambulatory Visit (HOSPITAL_COMMUNITY): Payer: Self-pay | Admitting: Internal Medicine

## 2018-11-03 DIAGNOSIS — R928 Other abnormal and inconclusive findings on diagnostic imaging of breast: Secondary | ICD-10-CM

## 2018-11-11 ENCOUNTER — Ambulatory Visit (INDEPENDENT_AMBULATORY_CARE_PROVIDER_SITE_OTHER): Payer: 59

## 2018-11-11 ENCOUNTER — Other Ambulatory Visit: Payer: Self-pay

## 2018-11-11 DIAGNOSIS — J4550 Severe persistent asthma, uncomplicated: Secondary | ICD-10-CM | POA: Diagnosis not present

## 2018-11-17 ENCOUNTER — Other Ambulatory Visit: Payer: Self-pay

## 2018-11-17 ENCOUNTER — Ambulatory Visit (HOSPITAL_COMMUNITY)
Admission: RE | Admit: 2018-11-17 | Discharge: 2018-11-17 | Disposition: A | Discharge status: Home / Self Care | Payer: 59 | Source: Ambulatory Visit | Attending: Internal Medicine | Admitting: Internal Medicine

## 2018-11-17 DIAGNOSIS — R928 Other abnormal and inconclusive findings on diagnostic imaging of breast: Secondary | ICD-10-CM | POA: Diagnosis present

## 2018-12-09 ENCOUNTER — Ambulatory Visit: Payer: Self-pay

## 2018-12-10 ENCOUNTER — Other Ambulatory Visit: Payer: Self-pay

## 2018-12-10 ENCOUNTER — Ambulatory Visit (INDEPENDENT_AMBULATORY_CARE_PROVIDER_SITE_OTHER): Payer: 59

## 2018-12-10 DIAGNOSIS — J4550 Severe persistent asthma, uncomplicated: Secondary | ICD-10-CM

## 2018-12-21 ENCOUNTER — Other Ambulatory Visit: Payer: 59 | Admitting: Adult Health

## 2018-12-21 ENCOUNTER — Other Ambulatory Visit (HOSPITAL_COMMUNITY)
Admission: RE | Admit: 2018-12-21 | Discharge: 2018-12-21 | Disposition: A | Discharge status: Home / Self Care | Payer: 59 | Source: Ambulatory Visit | Attending: Obstetrics and Gynecology | Admitting: Obstetrics and Gynecology

## 2018-12-21 ENCOUNTER — Other Ambulatory Visit: Payer: Self-pay

## 2018-12-21 ENCOUNTER — Ambulatory Visit (INDEPENDENT_AMBULATORY_CARE_PROVIDER_SITE_OTHER): Payer: 59 | Admitting: Obstetrics and Gynecology

## 2018-12-21 ENCOUNTER — Encounter: Payer: Self-pay | Admitting: Obstetrics and Gynecology

## 2018-12-21 VITALS — BP 141/76 | HR 90 | Ht 62.75 in | Wt 159.0 lb

## 2018-12-21 DIAGNOSIS — Z01419 Encounter for gynecological examination (general) (routine) without abnormal findings: Secondary | ICD-10-CM | POA: Diagnosis present

## 2018-12-21 NOTE — Progress Notes (Signed)
Patient ID: Kathy Tran, female   DOB: 1955/10/26, 63 y.o.   MRN: [id number]  Assessment:  Annual Gyn Exam Sebaceous cyst on left breast Minimal rectocele Severe COPD  Plan:  1. pap smear done, next pap due 3 years 2. return annually or prn 3    Annual mammogram advised after age 82 Subjective:  Kathy Tran is a 63 y.o. female No obstetric history on file. who presents for annual exam. No LMP recorded. Patient is postmenopausal. The patient has complaints today of none. Has has 2 lung broncscopies with biopsies came back negative. Has had mammogram and found a cyst to be benign. Has had colonoscopy last winter. Has known rectocele.   The following portions of the patient's history were reviewed and updated as appropriate: allergies, current medications, past family history, past medical history, past social history, past surgical history and problem list. Past Medical History:  Diagnosis Date  . Angio-edema   . Asthma    Flu march and May 2018  . COPD (chronic obstructive pulmonary disease) ([place])   . Diabetes mellitus type 2, controlled ([place])    x5 yrs  . Eczema   . Heart murmur   . Hypertension    x 5 yrs  . Recurrent upper respiratory infection (URI)   . Renal disorder    renal stones  . Urticaria     Past Surgical History:  Procedure Laterality Date  . ADENOIDECTOMY    . BIOPSY  04/01/2018   Procedure: BIOPSY;  Surgeon: Rogene Houston, MD;  [facility];  Service: Endoscopy;;  . CATARACT EXTRACTION W/PHACO Left 01/19/2018   Procedure: CATARACT EXTRACTION PHACO AND INTRAOCULAR LENS PLACEMENT LEFT EYE;  Surgeon: Tonny Branch, MD;  Location: [facility];  Service: Ophthalmology;  Laterality: Left;  CDE: 7.52  . CATARACT EXTRACTION W/PHACO Right 02/23/2018   Procedure: CATARACT EXTRACTION PHACO AND INTRAOCULAR LENS PLACEMENT ([place]);  Surgeon: Tonny Branch, MD;  Location: [facility];  Service: Ophthalmology;  Laterality: Right;  CDE: 5.47  . [place]  .  COLONOSCOPY  03/27/2011   Procedure: COLONOSCOPY;  Surgeon: Rogene Houston, MD;  [facility];  Service: Endoscopy;  Laterality: N/A;  2:00  . COLONOSCOPY N/A 04/01/2018   Procedure: COLONOSCOPY;  Surgeon: Rogene Houston, MD;  [facility];  Service: Endoscopy;  Laterality: N/A;  1030  . CYSTOSCOPY W/ URETERAL STENT PLACEMENT Left 11/08/2015   Procedure: CYSTOSCOPY, URETEROSCOPY, WITH STONE BASKET EXTRACTION;  Surgeon: Raynelle Bring, MD;  Location: [facility];  Service: Urology;  Laterality: Left;  . CYSTOSCOPY/RETROGRADE/URETEROSCOPY/STONE EXTRACTION WITH BASKET Right 07/03/2015   Procedure: CYSTOSCOPY/RETROGRADE/URETEROSCOPY/STONE EXTRACTION WITH BASKET;  Surgeon: Irine Seal, MD;  Location: [facility];  Service: Urology;  Laterality: Right;  . ENDOMETRIAL ABLATION    . FLEXIBLE BRONCHOSCOPY Bilateral 10/31/2016   Procedure: FLEXIBLE BRONCHOSCOPY WITH PROPOFOL;  Surgeon: Sinda Du, MD;  [facility];  Service: Cardiopulmonary;  Laterality: Bilateral;  . FLEXIBLE BRONCHOSCOPY N/A 01/17/2017   Procedure: FLEXIBLE BRONCHOSCOPY WITH PROPOFOL;  Surgeon: Sinda Du, MD;  [facility];  Service: Cardiopulmonary;  Laterality: N/A;  . KIDNEY STONE SURGERY     multiple  . SHOULDER SURGERY    . SINOSCOPY    . TONSILLECTOMY    . TUBAL LIGATION  1991  . TYMPANOSTOMY TUBE PLACEMENT       Current Outpatient Medications:  .  aspirin EC 81 MG tablet, Take 81 mg by mouth every Monday, Wednesday, and Friday.  AT NIGHT, Disp: , Rfl:  .  carvedilol (COREG) 25 MG tablet, Take 25 mg by mouth 2 (two) times daily with a meal., Disp: , Rfl:  .  diphenhydrAMINE (BENADRYL) 25 MG tablet, Take 25 mg by mouth at bedtime., Disp: , Rfl:  .  famotidine (PEPCID) 20 MG tablet, Take 20 mg by mouth at bedtime., Disp: , Rfl:  .  furosemide (LASIX) 40 MG tablet, Take 40 mg by mouth., Disp: , Rfl:  .  guaiFENesin (MUCINEX) 600 MG 12 hr tablet, Take 1 tablet (600 mg total) by mouth 2  (two) times daily., Disp: 60 tablet, Rfl: 1 .  ipratropium-albuterol (DUONEB) 0.5-2.5 (3) MG/3ML SOLN, Take 3 mLs by nebulization 3 (three) times daily., Disp: 360 mL, Rfl: 5 .  LORazepam (ATIVAN) 0.5 MG tablet, Take 0.5 mg by mouth at bedtime as needed for sleep. , Disp: , Rfl:  .  meloxicam (MOBIC) 15 MG tablet, Take 15 mg by mouth daily as needed for pain., Disp: , Rfl:  .  metFORMIN (GLUCOPHAGE-XR) 500 MG 24 hr tablet, Take 1,000 mg by mouth 2 (two) times daily., Disp: , Rfl:  .  montelukast (SINGULAIR) 10 MG tablet, TAKE (1) TABLET BY MOUTH AT BEDTIME., Disp: 30 tablet, Rfl: 5 .  NUCALA 100 MG SOLR, INJECT 100MG SUBCUTANEOUSLY EVERY 4 WEEKS (GIVEN AT PRESCRIBERS OFFICE) (Patient taking differently: Inject 100 mg into the skin every 28 (twenty-eight) days. ), Disp: 1 each, Rfl: 11 .  pantoprazole (PROTONIX) 40 MG tablet, Take 1 tablet (40 mg total) by mouth 2 (two) times daily before a meal. (Patient taking differently: Take 40 mg by mouth daily. ), Disp: 30 tablet, Rfl: 2 .  Polyethyl Glycol-Propyl Glycol (SYSTANE) 0.4-0.3 % SOLN, Place 1 drop into the right eye 3 (three) times daily., Disp: , Rfl:  .  pravastatin (PRAVACHOL) 10 MG tablet, Take 10 mg by mouth at bedtime. , Disp: , Rfl:  .  TRELEGY ELLIPTA 100-62.5-25 MCG/INH AEPB, INHALE 1 PUFF INTO THE LUNGS ONCE DAILY., Disp: 60 each, Rfl: 5 .  TRESIBA FLEXTOUCH 100 UNIT/ML SOPN FlexTouch Pen, INJECT 40 UNITS INTO THE SKIN DAILY AT 10 P.M. (Patient taking differently: Inject 40 Units into the skin daily at 10 pm. ), Disp: 15 mL, Rfl: 2 .  valsartan (DIOVAN) 80 MG tablet, , Disp: , Rfl:  .  VENTOLIN HFA 108 (90 Base) MCG/ACT inhaler, Inhale 2 puffs into the lungs every 4 (four) hours as needed for wheezing or shortness of breath. , Disp: , Rfl:  .  VICTOZA 18 MG/3ML SOPN, Inject 1.2 mg into the skin daily at 8 pm. BETWEEN 1900 & 2100, Disp: , Rfl:   Current Facility-Administered Medications:  Marland Kitchen  Mepolizumab SOLR 100 mg, 100 mg, Subcutaneous,  Q28 days, [name], MD, 100 mg at 12/10/18 1316  Review of Systems Constitutional: negative Gastrointestinal: negative Genitourinary: normal  Objective:  There were no vitals taken for this visit.   BMI: There is no height or weight on file to calculate BMI.  General Appearance: Alert, appropriate appearance for age. No acute distress HEENT: Grossly normal Neck / Thyroid:  Cardiovascular: RRR; normal S1, S2, no murmur Lungs: CTA bilaterally Back: No CVAT Breast Exam: No masses or nodes.No dimpling, nipple retraction or discharge. Sebaceous cyst 1 cm on left breast @ 9 o'clock near the sternum Gastrointestinal: Soft, non-tender, no masses or organomegaly Pelvic Exam: VAGINA: normal appearing vagina with known rectocele CERVIX: small cervix well supported UTERUS: uterus is normal size, shape, consistency  and nontender, anteverted, mobile,  RECTAL: guaiac negative stool obtained, PAP: Pap smear done today. Lymphatic Exam: Non-palpable nodes in neck, clavicular, axillary, or inguinal regions Skin: no rash or abnormalities Neurologic: Normal gait and speech, no tremor  Psychiatric: Alert and oriented, appropriate affect.  Urinalysis:Not done  By signing my name below, I, Samul Dada, attest that this documentation has been prepared under the direction and in the presence of Jonnie Kind, MD. Electronically Signed: [place]. 12/21/18. 1:47 PM.  I personally performed the services described in this documentation, which was SCRIBED in my presence. The recorded information has been reviewed and considered accurate. It has been edited as necessary during review. Jonnie Kind, MD

## 2018-12-23 ENCOUNTER — Other Ambulatory Visit: Payer: Self-pay | Admitting: Allergy & Immunology

## 2018-12-23 LAB — CYTOLOGY - PAP
Diagnosis: NEGATIVE
HPV: NOT DETECTED

## 2018-12-25 NOTE — Telephone Encounter (Signed)
Please advise 

## 2018-12-30 ENCOUNTER — Other Ambulatory Visit: Payer: Self-pay | Admitting: Allergy & Immunology

## 2019-01-08 ENCOUNTER — Other Ambulatory Visit: Payer: Self-pay

## 2019-01-08 ENCOUNTER — Ambulatory Visit (INDEPENDENT_AMBULATORY_CARE_PROVIDER_SITE_OTHER): Payer: 59

## 2019-01-08 DIAGNOSIS — J4550 Severe persistent asthma, uncomplicated: Secondary | ICD-10-CM | POA: Diagnosis not present

## 2019-01-13 ENCOUNTER — Ambulatory Visit: Payer: 59 | Admitting: Allergy & Immunology

## 2019-01-13 DIAGNOSIS — D839 Common variable immunodeficiency, unspecified: Secondary | ICD-10-CM | POA: Diagnosis not present

## 2019-01-13 DIAGNOSIS — J4550 Severe persistent asthma, uncomplicated: Secondary | ICD-10-CM | POA: Diagnosis not present

## 2019-01-13 DIAGNOSIS — I10 Essential (primary) hypertension: Secondary | ICD-10-CM | POA: Diagnosis not present

## 2019-01-13 DIAGNOSIS — Z23 Encounter for immunization: Secondary | ICD-10-CM | POA: Diagnosis not present

## 2019-01-13 DIAGNOSIS — E1165 Type 2 diabetes mellitus with hyperglycemia: Secondary | ICD-10-CM | POA: Diagnosis not present

## 2019-01-26 ENCOUNTER — Telehealth: Payer: Self-pay | Admitting: *Deleted

## 2019-01-26 NOTE — Telephone Encounter (Signed)
Patient had called and advised change of Ins this month to [place].  I had advised her that we could try to get free drug through patient assistance but she advised that her husband is still working and their income would not qualify them.  I advised her the only other thing she could do to make drug affordable is to change her plan to Medicare and supplement plan F or G and then we could buy and bill drug and it would be 100% covered. I explained in detail how the advantage plans work in relation to the MCR/Supp and she advised she would try to do same when enrollment opens in the next month. I told her that as long as we have samples we can give sample monthly till Jan 2021 when her plan would change then change over to [employer]

## 2019-01-29 DIAGNOSIS — D839 Common variable immunodeficiency, unspecified: Secondary | ICD-10-CM | POA: Diagnosis not present

## 2019-02-02 DIAGNOSIS — D839 Common variable immunodeficiency, unspecified: Secondary | ICD-10-CM | POA: Diagnosis not present

## 2019-02-05 ENCOUNTER — Other Ambulatory Visit: Payer: Self-pay

## 2019-02-05 ENCOUNTER — Ambulatory Visit (INDEPENDENT_AMBULATORY_CARE_PROVIDER_SITE_OTHER): Payer: Medicare HMO | Admitting: *Deleted

## 2019-02-05 DIAGNOSIS — J4550 Severe persistent asthma, uncomplicated: Secondary | ICD-10-CM | POA: Diagnosis not present

## 2019-02-17 ENCOUNTER — Encounter: Payer: Self-pay | Admitting: Allergy & Immunology

## 2019-02-17 ENCOUNTER — Ambulatory Visit (INDEPENDENT_AMBULATORY_CARE_PROVIDER_SITE_OTHER): Payer: Medicare HMO | Admitting: Allergy & Immunology

## 2019-02-17 ENCOUNTER — Other Ambulatory Visit: Payer: Self-pay

## 2019-02-17 VITALS — BP 126/82 | HR 80 | Temp 97.8°F | Resp 18 | Ht 62.75 in

## 2019-02-17 DIAGNOSIS — D801 Nonfamilial hypogammaglobulinemia: Secondary | ICD-10-CM

## 2019-02-17 DIAGNOSIS — J4550 Severe persistent asthma, uncomplicated: Secondary | ICD-10-CM | POA: Diagnosis not present

## 2019-02-17 DIAGNOSIS — J310 Chronic rhinitis: Secondary | ICD-10-CM | POA: Diagnosis not present

## 2019-02-17 DIAGNOSIS — E1159 Type 2 diabetes mellitus with other circulatory complications: Secondary | ICD-10-CM | POA: Diagnosis not present

## 2019-02-17 NOTE — Patient Instructions (Addendum)
1. 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 - We are not going to make any medication changes at this time. - Daily controller medication(s): Trelegy one puff once in the morning + Singulair 10mg  daily + Nucala monthly - Rescue medications: ProAir 4 puffs every 4-6 hours as needed or DuoNeb nebulizer one vial every 4-6 hours as needed - Asthma control goals:  * Full participation in all desired activities (may need albuterol before activity) * Albuterol use two time or less a week on average (not counting use with activity) * Cough interfering with sleep two time or less a month * Oral steroids no more than once a year * No hospitalizations    2. Chronic non-allergic rhinitis - Continue with Zyrtec (cetirizine) 10 mg once daily.   - Continue with Benadryl 25mg  at night.   3. 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  - Continue with all of the premedications, including hydration.  - We will get an IgG level at the next visit.   4. Cerumen impaction - Come back on Friday and I can clean out your ears.   5. Return in about 3 months (around 05/20/2019). This can be an in-person, a virtual Webex or a telephone follow up visit.   Please inform us of any Emergency Department visits, hospitalizations, or changes in symptoms. Call us before going to the ED for breathing or allergy symptoms since we might be able to fit you in for a sick visit. Feel free to contact us anytime with any questions, problems, or concerns.  It was a pleasure to see you again today!  Websites that have reliable patient information: 1. American Academy of Asthma, Allergy, and Immunology: www.aaaai.org 2. Food Allergy Research and Education (FARE): foodallergy.org 3. Mothers of Asthmatics: http://www.asthmacommunitynetwork.org 4. American College of Allergy, Asthma, and Immunology: www.acaai.org  "Like" [place] on Facebook and Instagram for  our latest updates!      Make sure you are registered to vote! If you have moved or changed any of your contact information, you will need to get this updated before voting!  In some cases, you MAY be able to register to vote online: [email or web address]    Voter ID laws are NOT going into effect for the General Election in November 2020! DO NOT let this stop you from exercising your right to vote!   Absentee voting is the SAFEST way to vote during the coronavirus pandemic!   Download and print an absentee ballot request form at [email or web address] or you can scan the QR code below with your smart phone:      More information on absentee ballots can be found here: [email or web address]

## 2019-02-17 NOTE — Progress Notes (Signed)
FOLLOW UP  Date of Service/Encounter:  02/17/19   Assessment:   Severe persistent asthma without complication- stable on Nucala monthly  Rash - ? 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 - We are not going to make any medication changes at this time. - Daily controller medication(s): Trelegy one puff once in the morning + Singulair 10mg  daily + Nucala monthly - Rescue medications: ProAir 4 puffs every 4-6 hours as needed or DuoNeb nebulizer one vial every 4-6 hours as needed - Asthma control goals:  * Full participation in all desired activities (may need albuterol before activity) * Albuterol use two time or less a week on average (not counting use with activity) * Cough interfering with sleep two time or less a month * Oral steroids no more than once a year * No hospitalizations    2. Chronic non-allergic rhinitis - Continue with Zyrtec (cetirizine) 10 mg once daily.   - Continue with Benadryl 25mg  at night.   3. 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  - Continue with all of the premedications, including hydration.  - We will get an IgG level at the next visit.   4. Cerumen impaction - Come back on Friday and I can clean out your ears.   5. Return in about 3 months (around 05/20/2019). This can be an in-person, a virtual Webex or a telephone follow up visit.   Subjective:   Kathy Tran is a 63 y.o. female presenting today for follow up of  Chief Complaint  Patient presents with  . Ear Fullness  . Asthma    Kathy Tran has a history of the following: Patient Active Problem List   Diagnosis Date Noted  . 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 Hx of  colonic polyps 01/07/2018  . Postmenopausal 07/02/2017  . Hypogammaglobulinemia ([place]) 04/29/2017  . Recurrent infections 04/29/2017  . Non-allergic rhinitis 04/29/2017  . Severe persistent asthma without complication [id number]  . Essential hypertension, benign 03/20/2017  . Asthma exacerbation 12/26/2016  . Bronchitis 09/25/2016  . Asthmatic bronchitis 07/18/2016  . DM type 2 causing vascular disease ([place]) 07/18/2016  . GERD (gastroesophageal reflux disease) 07/18/2016  . Mixed hyperlipidemia 07/18/2016  . History of colonic polyps 06/12/2016    History obtained from: chart review and patient.  Kathy Tran is a 63 y.o. female presenting for a follow up visit.  She was last seen in June 2020.  At that time, she presented with a rash.  We were unsure if this was related to sunscreen.  We did give her a Depo-Medrol injection to help calm her inflammation and added on clobetasol ointment twice daily.  For her severe asthma, she was doing well on mepolizumab and Trelegy 1 puff once daily.  For her nonallergic rhinitis, she was doing well with Zyrtec and Benadryl at night.  Her hypogammaglobulinemia was well controlled with immunoglobulin replacement.  She was reporting a 4 to 5 pound weight gain after each infusion, which decreased over the course of the week.  However, this and improved with the addition of Lasix from her primary care provider.  In the interim, she did change her insurance to [facility].  This unfortunately interrupted her mepolizumab coverage.  Kathy Tran talk to her  on September 15 and told her that we could likely get through with samples until January 2021.  She did recommend changing to a supplemental F or G plan to provide with 100% coverage.   Since the last visit, she has done well. She thinks that this was related to the sunscreen.   Asthma/Respiratory Symptom History: She continues on Trelegy and the Nucala. She is doing Singulair at night. We had previouslly gotten off of it  for five days, she the shortness of breath returned. She felt that she was not sleeping. She has been using her albuterol a couple of times per day. She has been doing that routinely for a few weeks. Weather changes have made this worse. She has been afebrile and doing fairly well aside from this.   Allergic Rhinitis Symptom History: She remains on her antihistamine daily.  She has not needed antibiotics at all since last visit.  Infection Symptom History: IVIG is going fairly well. She is getting 40 grams monthly. She is getting the pre-medication. The headaches have resolved. She has not been gaining the weight after the infusions as she typically has been doing. Her last IgG level was 794 in May 2020.   She is on Valsartan at night. She is also on the Lasix from Dr. Luan Pulling.  Diabetes is well controlled.  She no longer follows up with Dr. Dorris Fetch since she has stabilized.  All of her diabetes is managed by her primary care provider Dr. Willey Blade.   Overall she has not felt this good since prior to the onset of her symptoms. Otherwise, there have been no changes to her past medical history, surgical history, family history, or social history.    Review of Systems  Constitutional: Negative.  Negative for chills, fever, malaise/fatigue and weight loss.  HENT: Negative.  Negative for congestion, ear discharge and ear pain.   Eyes: Negative for pain, discharge and redness.  Respiratory: Positive for cough. Negative for sputum production, shortness of breath and wheezing.   Cardiovascular: Negative.  Negative for chest pain and palpitations.  Gastrointestinal: Negative for abdominal pain, constipation, diarrhea, heartburn, nausea and vomiting.  Skin: Negative.  Negative for itching and rash.  Neurological: Negative for dizziness and headaches.  Endo/Heme/Allergies: Negative for environmental allergies. Does not bruise/bleed easily.       Objective:   Blood pressure 126/82, pulse 80, temperature  97.8 F (36.6 C), temperature source Temporal, resp. rate 18, height 5' 2.75" (1.594 m). Body mass index is 28.39 kg/m.   Physical Exam:  Physical Exam  Constitutional: She appears well-developed.  Pleasant talkative female.   HENT:  Head: Normocephalic and atraumatic.  Right Ear: Tympanic membrane, external ear and ear canal normal.  Left Ear: Tympanic membrane, external ear and ear canal normal.  Nose: Mucosal edema and rhinorrhea present. No nose lacerations, nasal deformity or septal deviation. No epistaxis. Right sinus exhibits no maxillary sinus tenderness and no frontal sinus tenderness. Left sinus exhibits no maxillary sinus tenderness and no frontal sinus tenderness.  Mouth/Throat: Uvula is midline and oropharynx is clear and moist. Mucous membranes are not pale and not dry.  Eyes: Pupils are equal, round, and reactive to light. Conjunctivae and EOM are normal. Right eye exhibits no chemosis and no discharge. Left eye exhibits no chemosis and no discharge. Right conjunctiva is not injected. Left conjunctiva is not injected.  Cardiovascular: Normal rate, regular rhythm and normal heart sounds.  Respiratory: Effort normal and breath sounds normal. No accessory muscle usage. No tachypnea.  No respiratory distress. She has no wheezes. She has no rhonchi. She has no rales. She exhibits no tenderness.  Moving air well in all lung fields. No increased work of breathing noted.   Lymphadenopathy:    She has no cervical adenopathy.  Neurological: She is alert.  Skin: No abrasion, no petechiae and no rash noted. Rash is not papular, not vesicular and not urticarial. No erythema. No pallor.  No eczematous or urticarial lesions noted.   Psychiatric: She has a normal mood and affect.     Diagnostic studies:    Spirometry: results normal (FEV1: 1.70/83%, FVC: 2.14/76%, FEV1/FVC: 79%).    Spirometry consistent with possible restrictive disease but this is stable compared to her most recent  spirometric findings.    Allergy Studies: none           Salvatore Marvel, MD  [facility] [place]

## 2019-02-19 ENCOUNTER — Other Ambulatory Visit: Payer: Self-pay

## 2019-02-19 ENCOUNTER — Encounter: Payer: Self-pay | Admitting: Allergy & Immunology

## 2019-02-19 ENCOUNTER — Ambulatory Visit: Payer: Medicare HMO | Admitting: Allergy & Immunology

## 2019-02-19 VITALS — BP 146/8 | HR 91 | Resp 18 | Ht 62.75 in

## 2019-02-19 DIAGNOSIS — H6121 Impacted cerumen, right ear: Secondary | ICD-10-CM

## 2019-02-19 NOTE — Progress Notes (Signed)
Patient presented for cerumen removal following her office visit on October 7.  I did not have the instrumentation at the last visit, so I asked her to come in for quick cleaning.  On exam, she has cerumen impacting the right external auditory canal.  We did do flushing with warm water and then remove the instrumentation the cerumen.  I was able to visualize the tympanic membrane.  The tympanic membrane has quite a bit of scarring secondary to recurrent ear infections as a child and young adult.  She tells me that she has 60% hearing in that side anyway at baseline, but following the cerumen removal her hearing returned to baseline.  She was very pleased with the result.  There was no bleeding.  Patient tolerated the procedure well.  Salvatore Marvel, MD [facility] [place] [facility]

## 2019-02-24 DIAGNOSIS — R519 Headache, unspecified: Secondary | ICD-10-CM | POA: Diagnosis not present

## 2019-02-24 DIAGNOSIS — I10 Essential (primary) hypertension: Secondary | ICD-10-CM | POA: Diagnosis not present

## 2019-03-05 ENCOUNTER — Ambulatory Visit: Payer: Medicare HMO

## 2019-03-05 DIAGNOSIS — D839 Common variable immunodeficiency, unspecified: Secondary | ICD-10-CM | POA: Diagnosis not present

## 2019-03-10 ENCOUNTER — Ambulatory Visit (INDEPENDENT_AMBULATORY_CARE_PROVIDER_SITE_OTHER): Payer: Medicare HMO | Admitting: *Deleted

## 2019-03-10 ENCOUNTER — Other Ambulatory Visit: Payer: Self-pay

## 2019-03-10 DIAGNOSIS — J4550 Severe persistent asthma, uncomplicated: Secondary | ICD-10-CM

## 2019-03-10 DIAGNOSIS — E1159 Type 2 diabetes mellitus with other circulatory complications: Secondary | ICD-10-CM | POA: Diagnosis not present

## 2019-03-10 DIAGNOSIS — Z79899 Other long term (current) drug therapy: Secondary | ICD-10-CM | POA: Diagnosis not present

## 2019-03-10 DIAGNOSIS — I10 Essential (primary) hypertension: Secondary | ICD-10-CM | POA: Diagnosis not present

## 2019-03-10 DIAGNOSIS — J4540 Moderate persistent asthma, uncomplicated: Secondary | ICD-10-CM | POA: Diagnosis not present

## 2019-03-12 DIAGNOSIS — M1811 Unilateral primary osteoarthritis of first carpometacarpal joint, right hand: Secondary | ICD-10-CM | POA: Diagnosis not present

## 2019-03-12 DIAGNOSIS — M65311 Trigger thumb, right thumb: Secondary | ICD-10-CM | POA: Diagnosis not present

## 2019-03-15 ENCOUNTER — Other Ambulatory Visit: Payer: Self-pay | Admitting: Orthopedic Surgery

## 2019-03-17 DIAGNOSIS — J4550 Severe persistent asthma, uncomplicated: Secondary | ICD-10-CM | POA: Diagnosis not present

## 2019-03-17 DIAGNOSIS — E785 Hyperlipidemia, unspecified: Secondary | ICD-10-CM | POA: Diagnosis not present

## 2019-03-17 DIAGNOSIS — Z0001 Encounter for general adult medical examination with abnormal findings: Secondary | ICD-10-CM | POA: Diagnosis not present

## 2019-03-17 DIAGNOSIS — I10 Essential (primary) hypertension: Secondary | ICD-10-CM | POA: Diagnosis not present

## 2019-03-17 DIAGNOSIS — Z6834 Body mass index (BMI) 34.0-34.9, adult: Secondary | ICD-10-CM | POA: Diagnosis not present

## 2019-03-17 DIAGNOSIS — E1159 Type 2 diabetes mellitus with other circulatory complications: Secondary | ICD-10-CM | POA: Diagnosis not present

## 2019-03-27 ENCOUNTER — Other Ambulatory Visit: Payer: Self-pay | Admitting: Allergy & Immunology

## 2019-03-29 DIAGNOSIS — D839 Common variable immunodeficiency, unspecified: Secondary | ICD-10-CM | POA: Diagnosis not present

## 2019-03-31 ENCOUNTER — Other Ambulatory Visit: Payer: Self-pay

## 2019-03-31 ENCOUNTER — Encounter (HOSPITAL_BASED_OUTPATIENT_CLINIC_OR_DEPARTMENT_OTHER): Payer: Self-pay | Admitting: *Deleted

## 2019-03-31 NOTE — Progress Notes (Signed)
Patient needs BMP and EKG for surgery scheduled on 04/13/19. She states she just had blood work and EKG done around November 4th, 2020 at Dr Asencion Noble office in [place]. I tried to call Dr Willey Blade office today 11/18  At 1500 to have it faxed to [facility]. Dr Willey Blade office was closed and I was unable to leave a message. This information needs to be faxed to [phone] as soon as possible. The patient is aware we are trying to obtain this information and is aware that it may be possible she would need to come to [facility] for BMP and EKG.

## 2019-04-06 DIAGNOSIS — J301 Allergic rhinitis due to pollen: Secondary | ICD-10-CM | POA: Diagnosis not present

## 2019-04-06 DIAGNOSIS — J4550 Severe persistent asthma, uncomplicated: Secondary | ICD-10-CM | POA: Diagnosis not present

## 2019-04-06 DIAGNOSIS — E119 Type 2 diabetes mellitus without complications: Secondary | ICD-10-CM | POA: Diagnosis not present

## 2019-04-06 DIAGNOSIS — I10 Essential (primary) hypertension: Secondary | ICD-10-CM | POA: Diagnosis not present

## 2019-04-07 ENCOUNTER — Ambulatory Visit (INDEPENDENT_AMBULATORY_CARE_PROVIDER_SITE_OTHER): Payer: Medicare HMO

## 2019-04-07 ENCOUNTER — Other Ambulatory Visit: Payer: Self-pay

## 2019-04-07 DIAGNOSIS — J4550 Severe persistent asthma, uncomplicated: Secondary | ICD-10-CM | POA: Diagnosis not present

## 2019-04-09 ENCOUNTER — Other Ambulatory Visit (HOSPITAL_COMMUNITY): Payer: Medicare HMO | Attending: Orthopedic Surgery

## 2019-04-10 ENCOUNTER — Other Ambulatory Visit (HOSPITAL_COMMUNITY)
Admission: RE | Admit: 2019-04-10 | Discharge: 2019-04-10 | Disposition: A | Discharge status: Home / Self Care | Payer: Medicare HMO | Source: Ambulatory Visit | Attending: Orthopedic Surgery | Admitting: Orthopedic Surgery

## 2019-04-10 DIAGNOSIS — Z836 Family history of other diseases of the respiratory system: Secondary | ICD-10-CM | POA: Diagnosis not present

## 2019-04-10 DIAGNOSIS — J449 Chronic obstructive pulmonary disease, unspecified: Secondary | ICD-10-CM | POA: Diagnosis not present

## 2019-04-10 DIAGNOSIS — I10 Essential (primary) hypertension: Secondary | ICD-10-CM | POA: Diagnosis not present

## 2019-04-10 DIAGNOSIS — Z20828 Contact with and (suspected) exposure to other viral communicable diseases: Secondary | ICD-10-CM | POA: Diagnosis not present

## 2019-04-10 DIAGNOSIS — Z79899 Other long term (current) drug therapy: Secondary | ICD-10-CM | POA: Diagnosis not present

## 2019-04-10 DIAGNOSIS — K219 Gastro-esophageal reflux disease without esophagitis: Secondary | ICD-10-CM | POA: Diagnosis not present

## 2019-04-10 DIAGNOSIS — Z882 Allergy status to sulfonamides status: Secondary | ICD-10-CM | POA: Diagnosis not present

## 2019-04-10 DIAGNOSIS — E119 Type 2 diabetes mellitus without complications: Secondary | ICD-10-CM | POA: Diagnosis not present

## 2019-04-10 DIAGNOSIS — Z833 Family history of diabetes mellitus: Secondary | ICD-10-CM | POA: Diagnosis not present

## 2019-04-10 DIAGNOSIS — Z881 Allergy status to other antibiotic agents status: Secondary | ICD-10-CM | POA: Diagnosis not present

## 2019-04-10 DIAGNOSIS — M1811 Unilateral primary osteoarthritis of first carpometacarpal joint, right hand: Secondary | ICD-10-CM | POA: Diagnosis not present

## 2019-04-10 DIAGNOSIS — Z794 Long term (current) use of insulin: Secondary | ICD-10-CM | POA: Diagnosis not present

## 2019-04-10 LAB — SARS CORONAVIRUS 2 (TAT 6-24 HRS): SARS Coronavirus 2: NEGATIVE

## 2019-04-12 NOTE — Anesthesia Preprocedure Evaluation (Addendum)
Anesthesia Evaluation  Patient identified by MRN, date of birth, ID band  Reviewed: Allergy & Precautions, NPO status , Patient's Chart, lab work & pertinent test results  Airway Mallampati: I  TM Distance: >3 FB Neck ROM: Full    Dental no notable dental hx. (+) Teeth Intact   Pulmonary asthma , COPD,    Pulmonary exam normal breath sounds clear to auscultation       Cardiovascular hypertension, Pt. on medications Normal cardiovascular exam Rhythm:Regular Rate:Normal     Neuro/Psych negative neurological ROS  negative psych ROS   GI/Hepatic GERD  ,  Endo/Other  diabetes, Type 2  Renal/GU      Musculoskeletal   Abdominal   Peds  Hematology   Anesthesia Other Findings   Reproductive/Obstetrics                            Anesthesia Physical Anesthesia Plan  ASA: III  Anesthesia Plan: Regional   Post-op Pain Management:    Induction:   PONV Risk Score and Plan: 2 and Treatment may vary due to age or medical condition, Midazolam and Ondansetron  Airway Management Planned: Nasal Cannula and Natural Airway  Additional Equipment: None  Intra-op Plan:   Post-operative Plan:   Informed Consent: I have reviewed the patients History and Physical, chart, labs and discussed the procedure including the risks, benefits and alternatives for the proposed anesthesia with the patient or authorized representative who has indicated his/her understanding and acceptance.     Dental advisory given  Plan Discussed with:   Anesthesia Plan Comments: (R supraclavicular block)       Anesthesia Quick Evaluation

## 2019-04-13 ENCOUNTER — Ambulatory Visit (HOSPITAL_BASED_OUTPATIENT_CLINIC_OR_DEPARTMENT_OTHER): Payer: Medicare HMO | Admitting: Anesthesiology

## 2019-04-13 ENCOUNTER — Encounter (HOSPITAL_BASED_OUTPATIENT_CLINIC_OR_DEPARTMENT_OTHER): Payer: Self-pay

## 2019-04-13 ENCOUNTER — Other Ambulatory Visit: Payer: Self-pay

## 2019-04-13 ENCOUNTER — Encounter (HOSPITAL_BASED_OUTPATIENT_CLINIC_OR_DEPARTMENT_OTHER)
Admission: RE | Disposition: A | Discharge status: Home / Self Care | Payer: Self-pay | Source: Home / Self Care | Attending: Orthopedic Surgery

## 2019-04-13 ENCOUNTER — Ambulatory Visit (HOSPITAL_BASED_OUTPATIENT_CLINIC_OR_DEPARTMENT_OTHER)
Admission: RE | Admit: 2019-04-13 | Discharge: 2019-04-13 | Disposition: A | Discharge status: Home / Self Care | Payer: Medicare HMO | Attending: Orthopedic Surgery | Admitting: Orthopedic Surgery

## 2019-04-13 DIAGNOSIS — Z79899 Other long term (current) drug therapy: Secondary | ICD-10-CM | POA: Insufficient documentation

## 2019-04-13 DIAGNOSIS — Z833 Family history of diabetes mellitus: Secondary | ICD-10-CM | POA: Diagnosis not present

## 2019-04-13 DIAGNOSIS — E119 Type 2 diabetes mellitus without complications: Secondary | ICD-10-CM | POA: Diagnosis not present

## 2019-04-13 DIAGNOSIS — I10 Essential (primary) hypertension: Secondary | ICD-10-CM | POA: Insufficient documentation

## 2019-04-13 DIAGNOSIS — J449 Chronic obstructive pulmonary disease, unspecified: Secondary | ICD-10-CM | POA: Insufficient documentation

## 2019-04-13 DIAGNOSIS — Z794 Long term (current) use of insulin: Secondary | ICD-10-CM | POA: Diagnosis not present

## 2019-04-13 DIAGNOSIS — K219 Gastro-esophageal reflux disease without esophagitis: Secondary | ICD-10-CM | POA: Insufficient documentation

## 2019-04-13 DIAGNOSIS — E782 Mixed hyperlipidemia: Secondary | ICD-10-CM | POA: Diagnosis not present

## 2019-04-13 DIAGNOSIS — Z882 Allergy status to sulfonamides status: Secondary | ICD-10-CM | POA: Insufficient documentation

## 2019-04-13 DIAGNOSIS — Z20828 Contact with and (suspected) exposure to other viral communicable diseases: Secondary | ICD-10-CM | POA: Diagnosis not present

## 2019-04-13 DIAGNOSIS — M1811 Unilateral primary osteoarthritis of first carpometacarpal joint, right hand: Secondary | ICD-10-CM | POA: Diagnosis not present

## 2019-04-13 DIAGNOSIS — Z836 Family history of other diseases of the respiratory system: Secondary | ICD-10-CM | POA: Insufficient documentation

## 2019-04-13 DIAGNOSIS — M65311 Trigger thumb, right thumb: Secondary | ICD-10-CM | POA: Diagnosis not present

## 2019-04-13 DIAGNOSIS — Z881 Allergy status to other antibiotic agents status: Secondary | ICD-10-CM | POA: Insufficient documentation

## 2019-04-13 DIAGNOSIS — J4550 Severe persistent asthma, uncomplicated: Secondary | ICD-10-CM | POA: Diagnosis not present

## 2019-04-13 HISTORY — DX: Nonfamilial hypogammaglobulinemia: D80.1

## 2019-04-13 HISTORY — DX: Gastro-esophageal reflux disease without esophagitis: K21.9

## 2019-04-13 HISTORY — DX: Personal history of urinary calculi: Z87.442

## 2019-04-13 HISTORY — PX: CARPOMETACARPEL SUSPENSION PLASTY: SHX5005

## 2019-04-13 HISTORY — DX: Unspecified osteoarthritis, unspecified site: M19.90

## 2019-04-13 LAB — GLUCOSE, CAPILLARY
Glucose-Capillary: 183 mg/dL — ABNORMAL HIGH (ref 70–99)
Glucose-Capillary: 188 mg/dL — ABNORMAL HIGH (ref 70–99)

## 2019-04-13 SURGERY — CARPOMETACARPEL (CMC) SUSPENSION PLASTY
Anesthesia: Regional | Laterality: Right

## 2019-04-13 MED ORDER — ROPIVACAINE HCL 5 MG/ML IJ SOLN
INTRAMUSCULAR | Status: DC | PRN
  Administered 2019-04-13: 30 mL via PERINEURAL

## 2019-04-13 MED ORDER — ONDANSETRON HCL 4 MG/2ML IJ SOLN
INTRAMUSCULAR | Status: AC
  Filled 2019-04-13: qty 2

## 2019-04-13 MED ORDER — SUCCINYLCHOLINE CHLORIDE 200 MG/10ML IV SOSY
PREFILLED_SYRINGE | INTRAVENOUS | Status: AC
  Filled 2019-04-13: qty 10

## 2019-04-13 MED ORDER — MIDAZOLAM HCL 2 MG/2ML IJ SOLN
INTRAMUSCULAR | Status: AC
  Filled 2019-04-13: qty 2

## 2019-04-13 MED ORDER — FENTANYL CITRATE (PF) 100 MCG/2ML IJ SOLN
INTRAMUSCULAR | Status: AC
  Filled 2019-04-13: qty 2

## 2019-04-13 MED ORDER — EPHEDRINE 5 MG/ML INJ
INTRAVENOUS | Status: AC
  Filled 2019-04-13: qty 10

## 2019-04-13 MED ORDER — CEFAZOLIN SODIUM-DEXTROSE 2-4 GM/100ML-% IV SOLN
INTRAVENOUS | Status: AC
  Filled 2019-04-13: qty 100

## 2019-04-13 MED ORDER — ACETAMINOPHEN 500 MG PO TABS
ORAL_TABLET | ORAL | Status: AC
  Filled 2019-04-13: qty 2

## 2019-04-13 MED ORDER — PROPOFOL 10 MG/ML IV BOLUS
INTRAVENOUS | Status: DC | PRN
  Administered 2019-04-13: 20 mg via INTRAVENOUS

## 2019-04-13 MED ORDER — PROPOFOL 500 MG/50ML IV EMUL
INTRAVENOUS | Status: AC
  Filled 2019-04-13: qty 50

## 2019-04-13 MED ORDER — ACETAMINOPHEN 500 MG PO TABS
1000.0000 mg | ORAL_TABLET | Freq: Once | ORAL | Status: AC
  Administered 2019-04-13: 07:00:00 1000 mg via ORAL

## 2019-04-13 MED ORDER — ACETAMINOPHEN 10 MG/ML IV SOLN: 1000.0000 mg | Freq: Once | INTRAVENOUS | Status: DC | PRN

## 2019-04-13 MED ORDER — LIDOCAINE HCL (CARDIAC) PF 100 MG/5ML IV SOSY
PREFILLED_SYRINGE | INTRAVENOUS | Status: DC | PRN
  Administered 2019-04-13: 50 mg via INTRAVENOUS

## 2019-04-13 MED ORDER — LIDOCAINE 2% (20 MG/ML) 5 ML SYRINGE
INTRAMUSCULAR | Status: AC
  Filled 2019-04-13: qty 5

## 2019-04-13 MED ORDER — MIDAZOLAM HCL 2 MG/2ML IJ SOLN
1.0000 mg | INTRAMUSCULAR | Status: DC | PRN
  Administered 2019-04-13 (×2): 1 mg via INTRAVENOUS

## 2019-04-13 MED ORDER — CHLORHEXIDINE GLUCONATE 4 % EX LIQD: 60.0000 mL | Freq: Once | CUTANEOUS | Status: DC

## 2019-04-13 MED ORDER — ONDANSETRON HCL 4 MG/2ML IJ SOLN: 4.0000 mg | Freq: Once | INTRAMUSCULAR | Status: DC | PRN

## 2019-04-13 MED ORDER — FENTANYL CITRATE (PF) 100 MCG/2ML IJ SOLN: 25.0000 ug | INTRAMUSCULAR | Status: DC | PRN

## 2019-04-13 MED ORDER — TRAMADOL HCL 50 MG PO TABS: 50.0000 mg | ORAL_TABLET | Freq: Four times a day (QID) | ORAL | 0 refills | Status: DC | PRN

## 2019-04-13 MED ORDER — ONDANSETRON HCL 4 MG/2ML IJ SOLN
INTRAMUSCULAR | Status: DC | PRN
  Administered 2019-04-13: 4 mg via INTRAVENOUS

## 2019-04-13 MED ORDER — LACTATED RINGERS IV SOLN
INTRAVENOUS | Status: DC
  Administered 2019-04-13: 07:00:00 via INTRAVENOUS

## 2019-04-13 MED ORDER — CEFAZOLIN SODIUM-DEXTROSE 2-4 GM/100ML-% IV SOLN
2.0000 g | INTRAVENOUS | Status: AC
  Administered 2019-04-13: 2 g via INTRAVENOUS

## 2019-04-13 MED ORDER — FENTANYL CITRATE (PF) 100 MCG/2ML IJ SOLN
50.0000 ug | INTRAMUSCULAR | Status: DC | PRN
  Administered 2019-04-13: 08:00:00 50 ug via INTRAVENOUS

## 2019-04-13 MED ORDER — PROPOFOL 500 MG/50ML IV EMUL
INTRAVENOUS | Status: DC | PRN
  Administered 2019-04-13: 100 ug/kg/min via INTRAVENOUS

## 2019-04-13 MED ORDER — PHENYLEPHRINE 40 MCG/ML (10ML) SYRINGE FOR IV PUSH (FOR BLOOD PRESSURE SUPPORT)
PREFILLED_SYRINGE | INTRAVENOUS | Status: AC
  Filled 2019-04-13: qty 10

## 2019-04-13 SURGICAL SUPPLY — 46 items
APL PRP STRL LF DISP 70% ISPRP (MISCELLANEOUS) ×1
BIT DRILL PASSING CMC 1/4 FLEX (BIT) IMPLANT
BLADE MINI RND TIP GREEN BEAV (BLADE) ×2 IMPLANT
BLADE SURG 15 STRL LF DISP TIS (BLADE) ×1 IMPLANT
BLADE SURG 15 STRL SS (BLADE) ×2
BNDG CMPR 9X4 STRL LF SNTH (GAUZE/BANDAGES/DRESSINGS) ×1
BNDG COHESIVE 3X5 TAN STRL LF (GAUZE/BANDAGES/DRESSINGS) ×2 IMPLANT
BNDG ESMARK 4X9 LF (GAUZE/BANDAGES/DRESSINGS) ×2 IMPLANT
BNDG GAUZE ELAST 4 BULKY (GAUZE/BANDAGES/DRESSINGS) ×2 IMPLANT
BUTTON ALL-SUT W/BACKSTOP (Orthopedic Implant) ×1 IMPLANT
CHLORAPREP W/TINT 26 (MISCELLANEOUS) ×2 IMPLANT
CORD BIPOLAR FORCEPS 12FT (ELECTRODE) ×2 IMPLANT
COVER BACK TABLE REUSABLE LG (DRAPES) ×2 IMPLANT
COVER MAYO STAND REUSABLE (DRAPES) ×2 IMPLANT
CUFF TOURN SGL QUICK 18X4 (TOURNIQUET CUFF) ×1 IMPLANT
DRAPE EXTREMITY T 121X128X90 (DISPOSABLE) ×2 IMPLANT
DRAPE OEC MINIVIEW 54X84 (DRAPES) ×2 IMPLANT
DRAPE SURG 17X23 STRL (DRAPES) ×2 IMPLANT
DRILL PASSING CMC 1/4 FLEX (BIT) ×2
GAUZE 4X4 16PLY RFD (DISPOSABLE) IMPLANT
GAUZE SPONGE 4X4 12PLY STRL (GAUZE/BANDAGES/DRESSINGS) ×2 IMPLANT
GAUZE XEROFORM 1X8 LF (GAUZE/BANDAGES/DRESSINGS) ×2 IMPLANT
GLOVE BIOGEL M STRL SZ7.5 (GLOVE) ×1 IMPLANT
GLOVE BIOGEL PI IND STRL 8.5 (GLOVE) ×1 IMPLANT
GLOVE BIOGEL PI INDICATOR 8.5 (GLOVE) ×1
GLOVE SURG ORTHO 8.0 STRL STRW (GLOVE) ×2 IMPLANT
GOWN STRL REUS W/TWL XL LVL3 (GOWN DISPOSABLE) ×4 IMPLANT
NS IRRIG 1000ML POUR BTL (IV SOLUTION) ×2 IMPLANT
PACK BASIN DAY SURGERY FS (CUSTOM PROCEDURE TRAY) ×2 IMPLANT
PAD CAST 3X4 CTTN HI CHSV (CAST SUPPLIES) ×1 IMPLANT
PADDING CAST ABS 3INX4YD NS (CAST SUPPLIES) ×1
PADDING CAST ABS COTTON 3X4 (CAST SUPPLIES) IMPLANT
PADDING CAST COTTON 3X4 STRL (CAST SUPPLIES) ×2
SLEEVE SCD COMPRESS KNEE MED (MISCELLANEOUS) ×2 IMPLANT
SLING ARM IMMOBILIZER LRG (SOFTGOODS) ×1 IMPLANT
SPLINT PLASTER CAST XFAST 3X15 (CAST SUPPLIES) IMPLANT
SPLINT PLASTER XTRA FASTSET 3X (CAST SUPPLIES) ×1
STOCKINETTE 4X48 STRL (DRAPES) ×2 IMPLANT
SUT ETHIBOND 3-0 V-5 (SUTURE) ×2 IMPLANT
SUT ETHILON 4 0 PS 2 18 (SUTURE) ×2 IMPLANT
SUT STEEL 3 0 (SUTURE) ×2 IMPLANT
SUT VIC AB 4-0 P-3 18XBRD (SUTURE) IMPLANT
SUT VIC AB 4-0 P3 18 (SUTURE) ×2
SYR BULB 3OZ (MISCELLANEOUS) ×2 IMPLANT
TOWEL GREEN STERILE FF (TOWEL DISPOSABLE) ×4 IMPLANT
UNDERPAD 30X36 HEAVY ABSORB (UNDERPADS AND DIAPERS) ×2 IMPLANT

## 2019-04-13 NOTE — Anesthesia Procedure Notes (Signed)
Anesthesia Regional Block: Supraclavicular block   Pre-Anesthetic Checklist: ,, timeout performed, Correct Patient, Correct Site, Correct Laterality, Correct Procedure, Correct Position, site marked, Risks and benefits discussed,  Surgical consent,  Pre-op evaluation,  At surgeon's request and post-op pain management  Laterality: Right  Prep: chloraprep       Needles:  Injection technique: Single-shot  Needle Type: Echogenic Needle     Needle Length: 5cm  Needle Gauge: 21     Additional Needles:   Procedures:,,,, ultrasound used (permanent image in chart),,,,  Narrative:  Start time: 04/13/2019 7:43 AM End time: 04/13/2019 7:48 AM Injection made incrementally with aspirations every 5 mL.  Performed by: Personally  Anesthesiologist: Barnet Glasgow, MD

## 2019-04-13 NOTE — Discharge Instructions (Addendum)
Post Anesthesia Home Care Instructions  Activity: Get plenty of rest for the remainder of the day. A responsible individual must stay with you for 24 hours following the procedure.  For the next 24 hours, DO NOT: -Drive a car -Operate machinery -Drink alcoholic beverages -Take any medication unless instructed by your physician -Make any legal decisions or sign important papers.  Meals: Start with liquid foods such as gelatin or soup. Progress to regular foods as tolerated. Avoid greasy, spicy, heavy foods. If nausea and/or vomiting occur, drink only clear liquids until the nausea and/or vomiting subsides. Call your physician if vomiting continues.  Special Instructions/Symptoms: Your throat may feel dry or sore from the anesthesia or the breathing tube placed in your throat during surgery. If this causes discomfort, gargle with warm salt water. The discomfort should disappear within 24 hours.  If you had a scopolamine patch placed behind your ear for the management of post- operative nausea and/or vomiting:  1. The medication in the patch is effective for 72 hours, after which it should be removed.  Wrap patch in a tissue and discard in the trash. Wash hands thoroughly with soap and water. 2. You may remove the patch earlier than 72 hours if you experience unpleasant side effects which may include dry mouth, dizziness or visual disturbances. 3. Avoid touching the patch. Wash your hands with soap and water after contact with the patch.      Regional Anesthesia Blocks  1. Numbness or the inability to move the "blocked" extremity may last from 3-48 hours after placement. The length of time depends on the medication injected and your individual response to the medication. If the numbness is not going away after 48 hours, call your surgeon.  2. The extremity that is blocked will need to be protected until the numbness is gone and the  Strength has returned. Because you cannot feel it, you  will need to take extra care to avoid injury. Because it may be weak, you may have difficulty moving it or using it. You may not know what position it is in without looking at it while the block is in effect.  3. For blocks in the legs and feet, returning to weight bearing and walking needs to be done carefully. You will need to wait until the numbness is entirely gone and the strength has returned. You should be able to move your leg and foot normally before you try and bear weight or walk. You will need someone to be with you when you first try to ensure you do not fall and possibly risk injury.  4. Bruising and tenderness at the needle site are common side effects and will resolve in a few days.  5. Persistent numbness or new problems with movement should be communicated to the surgeon or the [facility] ([phone])/ [facility] ([phone]).    Hand Center Instructions Hand Surgery  Wound Care: Keep your hand elevated above the level of your heart.  Do not allow it to dangle by your side.  Keep the dressing dry and do not remove it unless your doctor advises you to do so.  He will usually change it at the time of your post-op visit.  Moving your fingers is advised to stimulate circulation but will depend on the site of your surgery.  If you have a splint applied, your doctor will advise you regarding movement.  Activity: Do not drive or operate machinery today.  Rest today and   then you may return to your normal activity and work as indicated by your physician.  Diet:  Drink liquids today or eat a light diet.  You may resume a regular diet tomorrow.    General expectations: Pain for two to three days. Fingers may become slightly swollen.  Call your doctor if any of the following occur: Severe pain not relieved by pain medication. Elevated temperature. Dressing soaked with blood. Inability to move fingers. White or bluish color to fingers.  

## 2019-04-13 NOTE — Op Note (Signed)
NAME: Kathy Tran MEDICAL RECORD NO: [id number] DATE OF BIRTH: Jul 21, 1955 FAC[facility] LOCATION: [facility] PHYSICIAN: Wynonia Sours, MD   OPERATIVE REPORT   DATE OF PROCEDURE: 04/13/19    PREOPERATIVE DIAGNOSIS:   CMC arthritis triggering right thumb   POSTOPERATIVE DIAGNOSIS:   Same   PROCEDURE:   Release A1 pulley right thumb with suspension plasty excision trapezium tendon transfer FCR and micro link support right thumb   SURGEON: Kathy Tran, M.D.   ASSISTANT: Leverne Humbles, [facility]   ANESTHESIA:  Regional with sedation   INTRAVENOUS FLUIDS:  Per anesthesia flow sheet.   ESTIMATED BLOOD LOSS:  Minimal.   COMPLICATIONS:  None.   SPECIMENS:  none   TOURNIQUET TIME:    Total Tourniquet Time Documented: Upper Arm (Right) - 61 minutes Total: Upper Arm (Right) - 61 minutes    DISPOSITION:  Stable to PACU.   INDICATIONS: Patient is a 63 year old female with a long history of pain in the basilar joint of her right thumb this has not responded to conservative treatment she is elected undergo surgical excision of the trapezium with arthroplasty and release of the A1 pulley for triggering of her right thumb.  Preperi-and postoperative course been discussed along with risks and complications.  She is aware that there is no guarantee to the surgery the possibility of infection recurrence injury to arteries nerves tendons and incomplete relief of symptoms and dystrophy.  In the preoperative area the patient is seen the extremity marked by both patient and surgeon antibiotic given a supraclavicular block was carried out without difficulty under the anesthesia department.  This was done in the preoperative area.  OPERATIVE COURSE: She was brought to the operating room placed in the supine position with the right arm free.  She was prepped using ChloraPrep a 3-minute dry time was allowed and timeout taken confirming patient procedure.  The limb was exsanguinated with  an Esmarch bandage tourniquet placed on the upper arm was inflated to 250 mmHg.  The A1 pulley was addressed first.  Transverse incision was made over the A1 pulley flexor crease of the right thumb carried down through subcutaneous tissue.  Neurovascular structures were identified protected with retractors radially and ulnarly the A1 pulley was then released on its radial aspect the oblique pulley left intact tenosynovial tissue proximally was bluntly separated.  The thumb was placed through full range of motion no further triggering was noted.  The wound was irrigated and closed with opted for nylon sutures  The [facility] joint was then addressed.  A longitudinal incision was made over the insertion of the abductor pollicis longus tendon.  This carried down through subcutaneous tissue.  Bleeders were electrocauterized with bipolar.  Radial nerve branches were protected with retractors and incision made between the extensor pollicis brevis and abductor pollicis longus tendons.  The [facility] joint was opened significant arthritic changes were present.  Significant deformity of both bones was present.  This was confirmed with image intensification.  The radial artery was identified proximally.  The dissection carried proximally to the STT joint.  The trapezium was then isolated with blunt sharp dissection and removed piecemeal with a rondure.  Care was taken to remove any osteophyte formation between the thumb and index metacarpals.  The flexor carpi radialis tendon was then brought into the wound with retractors the radial one half was then divided left attached distally and separated from the remaining FCR.  The micro link suture was then placed in the  base of the thumb metacarpal at the insertion of the abductor pollicis longus and then passed through the base of the thumb metacarpal and then through the base of the index marked metacarpal obliquely.  The position was confirmed with image intensification.  The incision was  then made at the egress of the guidepin for the micro link suture.  This carried down through subcutaneous tissue.  Bleeders electrocauterized the radial nerve branches protected.  The micro link suture was then passed through the base of the thumb base of the index metacarpal a right angle retractor was placed between the base of the 2 metacarpals and the anchor attached to the micro link and it was sutured in position and this position was confirmed with image intensification.  The flexor carpi radialis was then woven through the abductor pollicis longus sutured in position with horizontal mattress 3-0 Ethibond sutures.  Was then passed back into the space created by excision of the trapezium and sutured to itself with the Ethibond sutures.  This firmly fixed to the thumb suspended from the index full mobility was able to be assessed.  The wound was again irrigated the capsule closed with interrupted 4-0 Vicryl sutures subcutaneous tissue with interrupted 4-0 Vicryl and the skin with interrupted 4-0 nylon sutures sterile compressive dressing thumb thumb spica splint applied.  Deflation of the tourniquet all fingers immediately pink.  She was taken to the recovery room for observation in satisfactory condition.  She will be discharged home return [facility] [place] in 1 week on Ultram.   Kathy Brod, MD Electronically signed, 04/13/19

## 2019-04-13 NOTE — Anesthesia Postprocedure Evaluation (Signed)
Anesthesia Post Note  Patient: Kathy Tran  Procedure(s) Performed: CARPOMETACARPEL ([place]) SUSPENSION PLASTY RIGHT THUMB (Right )     Patient location during evaluation: PACU Anesthesia Type: Regional Level of consciousness: awake and alert Pain management: pain level controlled Vital Signs Assessment: post-procedure vital signs reviewed and stable Respiratory status: spontaneous breathing, nonlabored ventilation, respiratory function stable and patient connected to nasal cannula oxygen Cardiovascular status: stable and blood pressure returned to baseline Postop Assessment: no apparent nausea or vomiting Anesthetic complications: no    Last Vitals:  Vitals:   04/13/19 1030 04/13/19 1045  BP: 136/78 124/67  Pulse: 64 71  Resp: 12 18  Temp:  36.6 C  SpO2: 95% 95%    Last Pain:  Vitals:   04/13/19 1045  TempSrc: Oral  PainSc: 0-No pain                 Barnet Glasgow

## 2019-04-13 NOTE — Progress Notes (Signed)
Assisted Dr. Houser with right, ultrasound guided, supraclavicular block. Side rails up, monitors on throughout procedure. See vital signs in flow sheet. Tolerated Procedure well. 

## 2019-04-13 NOTE — Brief Op Note (Signed)
04/13/2019  9:45 AM  PATIENT:  Kathy Tran  63 y.o. female  PRE-OPERATIVE DIAGNOSIS:  END STAGE OSTEOARTHRITIS [place] RIGHT THUMB  POST-OPERATIVE DIAGNOSIS:  END STAGE OSTEOARTHRITIS CMC RIGHT THUMB  PROCEDURE:  Procedure(s) with comments: CARPOMETACARPEL ([place]) SUSPENSION PLASTY RIGHT THUMB (Right) - AXILLARY BLOCK  SURGEON:  Surgeon(s) and Role:    * Daryll Brod, MD - Primary  PHYSICIAN ASSISTANT:   ASSISTANTS: R Dasnoit,PAC   ANESTHESIA:   regional and IV sedation  EBL:  16ml  BLOOD ADMINISTERED:none  DRAINS: none   LOCAL MEDICATIONS USED:  NONE  SPECIMEN:  No Specimen  DISPOSITION OF SPECIMEN:  N/A  COUNTS:  YES  TOURNIQUET:  * Missing tourniquet times found for documented tourniquets in log: [id number] *  DICTATION: .Dragon Dictation  PLAN OF CARE: Discharge to home after PACU  PATIENT DISPOSITION:  PACU - hemodynamically stable.

## 2019-04-13 NOTE — Transfer of Care (Signed)
Immediate Anesthesia Transfer of Care Note  Patient: Kathy Tran  Procedure(s) Performed: CARPOMETACARPEL ([place]) SUSPENSION PLASTY RIGHT THUMB (Right )  Patient Location: PACU  Anesthesia Type:MAC combined with regional for post-op pain  Level of Consciousness: awake, alert  and drowsy  Airway & Oxygen Therapy: Patient Spontanous Breathing and Patient connected to face mask oxygen  Post-op Assessment: Report given to RN and Post -op Vital signs reviewed and stable  Post vital signs: Reviewed and stable  Last Vitals:  Vitals Value Taken Time  BP    Temp    Pulse 75 04/13/19 1003  Resp 15 04/13/19 1003  SpO2 96 % 04/13/19 1003  Vitals shown include unvalidated device data.  Last Pain:  Vitals:   04/13/19 0704  PainSc: 6       Patients Stated Pain Goal: 6 (26/37/85 8850)  Complications: No apparent anesthesia complications

## 2019-04-13 NOTE — H&P (Signed)
Kathy Tran is an 63 y.o. female.   Chief Complaint: pain right thumb HPI: HPILou Webb Tran is a 63 year old right-hand-dominant female referred by Dr. Willey Blade for consultation with regarding pain in her right hand thumb wrist area. This been going on for 6 months. He is placed on a topical nonsteroidal anti-inflammatory. She states this has given her no relief. She has had an injury to the area at the age of 13 when she had a fracture. She complains of a sharp shooting pain with a VAS score 6/10 with pinching or use it goes to 8/10. It does not radiate more proximally she localizes at the basilar area of the right thumb.She has been treated conservatively with splints and anti-inflammatories she states over the past several weeks her thumb has become inflamed on her right side including the entire ray. She complains of pain at the volar aspect metacarpal phalangeal joint with occasional catching and pain at the basilar joint. He states this is 6-7 over 10. She has been taking meloxicam. It is causing some problems with her blood pressure. Gripping pinching increases this for her. She has some shooting pains going up her arm.   She has a history of diabetes and arthritis no history of thyroid problems or gout. Family history is positive diabetes and arthritis negative for thyroid problems and gout. She is not taking any other medicine for this. He has been wearing splint.  Past Medical History:    Past Medical History:  Diagnosis Date  . Angio-edema   . Arthritis   . Asthma    Flu march and May 2018  . COPD (chronic obstructive pulmonary disease) ([place])   . Diabetes mellitus type 2, controlled ([place])    x5 yrs  . Eczema   . GERD (gastroesophageal reflux disease)   . Heart murmur   . History of kidney stones   . Hypertension    x 5 yrs  . Hypogammaglobulinemia ([place])   . Recurrent upper respiratory infection (URI)   . Renal disorder    renal stones  . Urticaria     Past Surgical History:   Procedure Laterality Date  . ADENOIDECTOMY    . BIOPSY  04/01/2018   Procedure: BIOPSY;  Surgeon: Rogene Houston, MD;  [facility];  Service: Endoscopy;;  . CATARACT EXTRACTION W/PHACO Left 01/19/2018   Procedure: CATARACT EXTRACTION PHACO AND INTRAOCULAR LENS PLACEMENT LEFT EYE;  Surgeon: Tonny Branch, MD;  Location: [facility];  Service: Ophthalmology;  Laterality: Left;  CDE: 7.52  . CATARACT EXTRACTION W/PHACO Right 02/23/2018   Procedure: CATARACT EXTRACTION PHACO AND INTRAOCULAR LENS PLACEMENT ([place]);  Surgeon: Tonny Branch, MD;  Location: [facility];  Service: Ophthalmology;  Laterality: Right;  CDE: 5.47  . [place]  . COLONOSCOPY  03/27/2011   Procedure: COLONOSCOPY;  Surgeon: Rogene Houston, MD;  [facility];  Service: Endoscopy;  Laterality: N/A;  2:00  . COLONOSCOPY N/A 04/01/2018   Procedure: COLONOSCOPY;  Surgeon: Rogene Houston, MD;  [facility];  Service: Endoscopy;  Laterality: N/A;  1030  . CYSTOSCOPY W/ URETERAL STENT PLACEMENT Left 11/08/2015   Procedure: CYSTOSCOPY, URETEROSCOPY, WITH STONE BASKET EXTRACTION;  Surgeon: Raynelle Bring, MD;  Location: [facility];  Service: Urology;  Laterality: Left;  . CYSTOSCOPY/RETROGRADE/URETEROSCOPY/STONE EXTRACTION WITH BASKET Right 07/03/2015   Procedure: CYSTOSCOPY/RETROGRADE/URETEROSCOPY/STONE EXTRACTION WITH BASKET;  Surgeon: Irine Seal, MD;  Location: [facility];  Service: Urology;  Laterality: Right;  . ENDOMETRIAL ABLATION    .  FLEXIBLE BRONCHOSCOPY Bilateral 10/31/2016   Procedure: FLEXIBLE BRONCHOSCOPY WITH PROPOFOL;  Surgeon: Sinda Du, MD;  [facility];  Service: Cardiopulmonary;  Laterality: Bilateral;  . FLEXIBLE BRONCHOSCOPY N/A 01/17/2017   Procedure: FLEXIBLE BRONCHOSCOPY WITH PROPOFOL;  Surgeon: Sinda Du, MD;  [facility];  Service: Cardiopulmonary;  Laterality: N/A;  . KIDNEY STONE SURGERY     multiple  . SHOULDER SURGERY    . SINOSCOPY    .  TONSILLECTOMY    . TUBAL LIGATION  1991  . TYMPANOSTOMY TUBE PLACEMENT      Family History  Problem Relation Age of Onset  . COPD Mother   . Cancer Father    Social History:  reports that she has never smoked. She has never used smokeless tobacco. She reports previous alcohol use. She reports that she does not use drugs.  Allergies:  Allergies  Allergen Reactions  . Sulfa Antibiotics Anaphylaxis    No medications prior to admission.    No results found for this or any previous visit (from the past 48 hour(s)).  No results found.   Pertinent items are noted in HPI.  Height _0  (1.575 m), weight 70.3 kg.  General appearance: alert, cooperative and appears stated age Head: Normocephalic, without obvious abnormality Neck: no JVD Resp: clear to auscultation bilaterally Cardio: regular rate and rhythm, S1, S2 normal, no murmur, click, rub or gallop GI: soft, non-tender; bowel sounds normal; no masses,  no organomegaly Extremities:  pain and catching  right thumb Pulses: 2+ and symmetric Skin: Skin color, texture, turgor normal. No rashes or lesions Neurologic: Grossly normal Incision/Wound: na  Assessment/Plan Assessment:  1. Primary osteoarthritis of first carpometacarpal joint of right hand  @. Trigger right thumb  Plan: Discussed surgical intervention with her. We would recommend excision of the trapezium with tendon transfer and micro link support along with release A1 pulley right thumb. Preperi-and postoperative course are discussed along with risks and complications. She is aware there is no guarantee of the surgery the possibility of infection recurrence injury to arteries nerves tendons complete relief symptoms and dystrophy.   Daryll Brod 04/13/2019, 5:23 AM

## 2019-04-14 ENCOUNTER — Encounter (HOSPITAL_BASED_OUTPATIENT_CLINIC_OR_DEPARTMENT_OTHER): Payer: Self-pay | Admitting: Orthopedic Surgery

## 2019-04-19 DIAGNOSIS — M79644 Pain in right finger(s): Secondary | ICD-10-CM | POA: Diagnosis not present

## 2019-04-19 DIAGNOSIS — M1811 Unilateral primary osteoarthritis of first carpometacarpal joint, right hand: Secondary | ICD-10-CM | POA: Diagnosis not present

## 2019-04-21 DIAGNOSIS — D839 Common variable immunodeficiency, unspecified: Secondary | ICD-10-CM | POA: Diagnosis not present

## 2019-04-26 DIAGNOSIS — M1811 Unilateral primary osteoarthritis of first carpometacarpal joint, right hand: Secondary | ICD-10-CM | POA: Diagnosis not present

## 2019-04-27 ENCOUNTER — Other Ambulatory Visit: Payer: Self-pay | Admitting: Allergy & Immunology

## 2019-04-28 DIAGNOSIS — I10 Essential (primary) hypertension: Secondary | ICD-10-CM | POA: Diagnosis not present

## 2019-05-05 ENCOUNTER — Other Ambulatory Visit: Payer: Self-pay

## 2019-05-05 ENCOUNTER — Ambulatory Visit (INDEPENDENT_AMBULATORY_CARE_PROVIDER_SITE_OTHER): Payer: Medicare HMO

## 2019-05-05 DIAGNOSIS — R29898 Other symptoms and signs involving the musculoskeletal system: Secondary | ICD-10-CM | POA: Diagnosis not present

## 2019-05-05 DIAGNOSIS — M25641 Stiffness of right hand, not elsewhere classified: Secondary | ICD-10-CM | POA: Diagnosis not present

## 2019-05-05 DIAGNOSIS — J4550 Severe persistent asthma, uncomplicated: Secondary | ICD-10-CM

## 2019-05-05 DIAGNOSIS — M1811 Unilateral primary osteoarthritis of first carpometacarpal joint, right hand: Secondary | ICD-10-CM | POA: Diagnosis not present

## 2019-05-17 DIAGNOSIS — R29898 Other symptoms and signs involving the musculoskeletal system: Secondary | ICD-10-CM | POA: Diagnosis not present

## 2019-05-17 DIAGNOSIS — M1811 Unilateral primary osteoarthritis of first carpometacarpal joint, right hand: Secondary | ICD-10-CM | POA: Diagnosis not present

## 2019-05-17 DIAGNOSIS — M25641 Stiffness of right hand, not elsewhere classified: Secondary | ICD-10-CM | POA: Diagnosis not present

## 2019-05-18 DIAGNOSIS — D839 Common variable immunodeficiency, unspecified: Secondary | ICD-10-CM | POA: Diagnosis not present

## 2019-05-24 DIAGNOSIS — M1811 Unilateral primary osteoarthritis of first carpometacarpal joint, right hand: Secondary | ICD-10-CM | POA: Diagnosis not present

## 2019-05-24 DIAGNOSIS — M25641 Stiffness of right hand, not elsewhere classified: Secondary | ICD-10-CM | POA: Diagnosis not present

## 2019-05-24 DIAGNOSIS — R29898 Other symptoms and signs involving the musculoskeletal system: Secondary | ICD-10-CM | POA: Diagnosis not present

## 2019-05-28 DIAGNOSIS — B370 Candidal stomatitis: Secondary | ICD-10-CM | POA: Diagnosis not present

## 2019-05-28 DIAGNOSIS — I10 Essential (primary) hypertension: Secondary | ICD-10-CM | POA: Diagnosis not present

## 2019-06-02 ENCOUNTER — Ambulatory Visit (INDEPENDENT_AMBULATORY_CARE_PROVIDER_SITE_OTHER): Payer: Medicare HMO

## 2019-06-02 DIAGNOSIS — J4550 Severe persistent asthma, uncomplicated: Secondary | ICD-10-CM

## 2019-06-16 DIAGNOSIS — D839 Common variable immunodeficiency, unspecified: Secondary | ICD-10-CM | POA: Diagnosis not present

## 2019-06-30 ENCOUNTER — Ambulatory Visit (INDEPENDENT_AMBULATORY_CARE_PROVIDER_SITE_OTHER): Payer: Medicare HMO

## 2019-06-30 DIAGNOSIS — J4550 Severe persistent asthma, uncomplicated: Secondary | ICD-10-CM

## 2019-07-14 DIAGNOSIS — D839 Common variable immunodeficiency, unspecified: Secondary | ICD-10-CM | POA: Diagnosis not present

## 2019-07-17 IMAGING — DX DG CHEST 2V
2 series · 2 of 2 positions shown · non-contrast
Comparison: October 22, 2016

CLINICAL DATA: Shortness of breath, worse in the last week.
Productive cough since [REDACTED].

EXAM:
CHEST  2 VIEW

[chest pa]
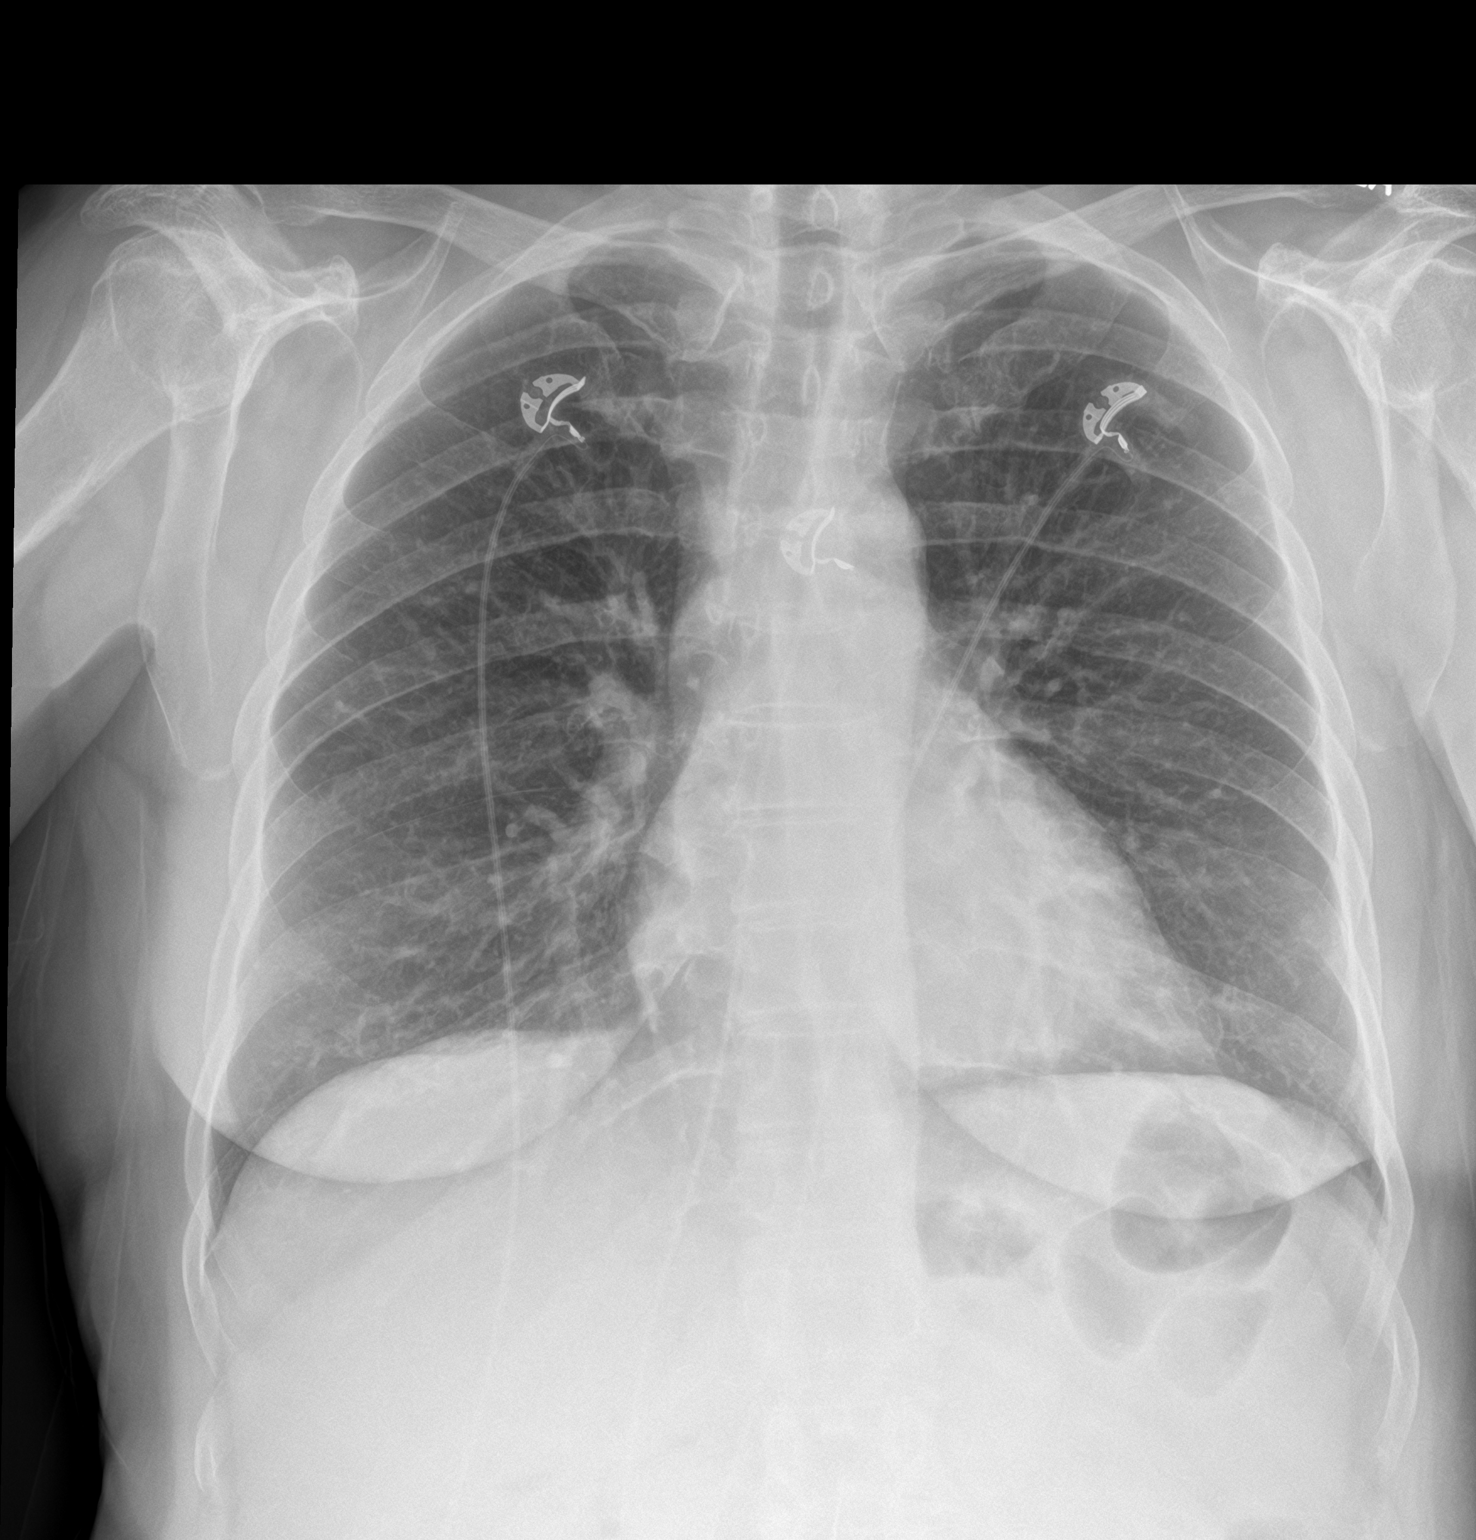

[chest lat]
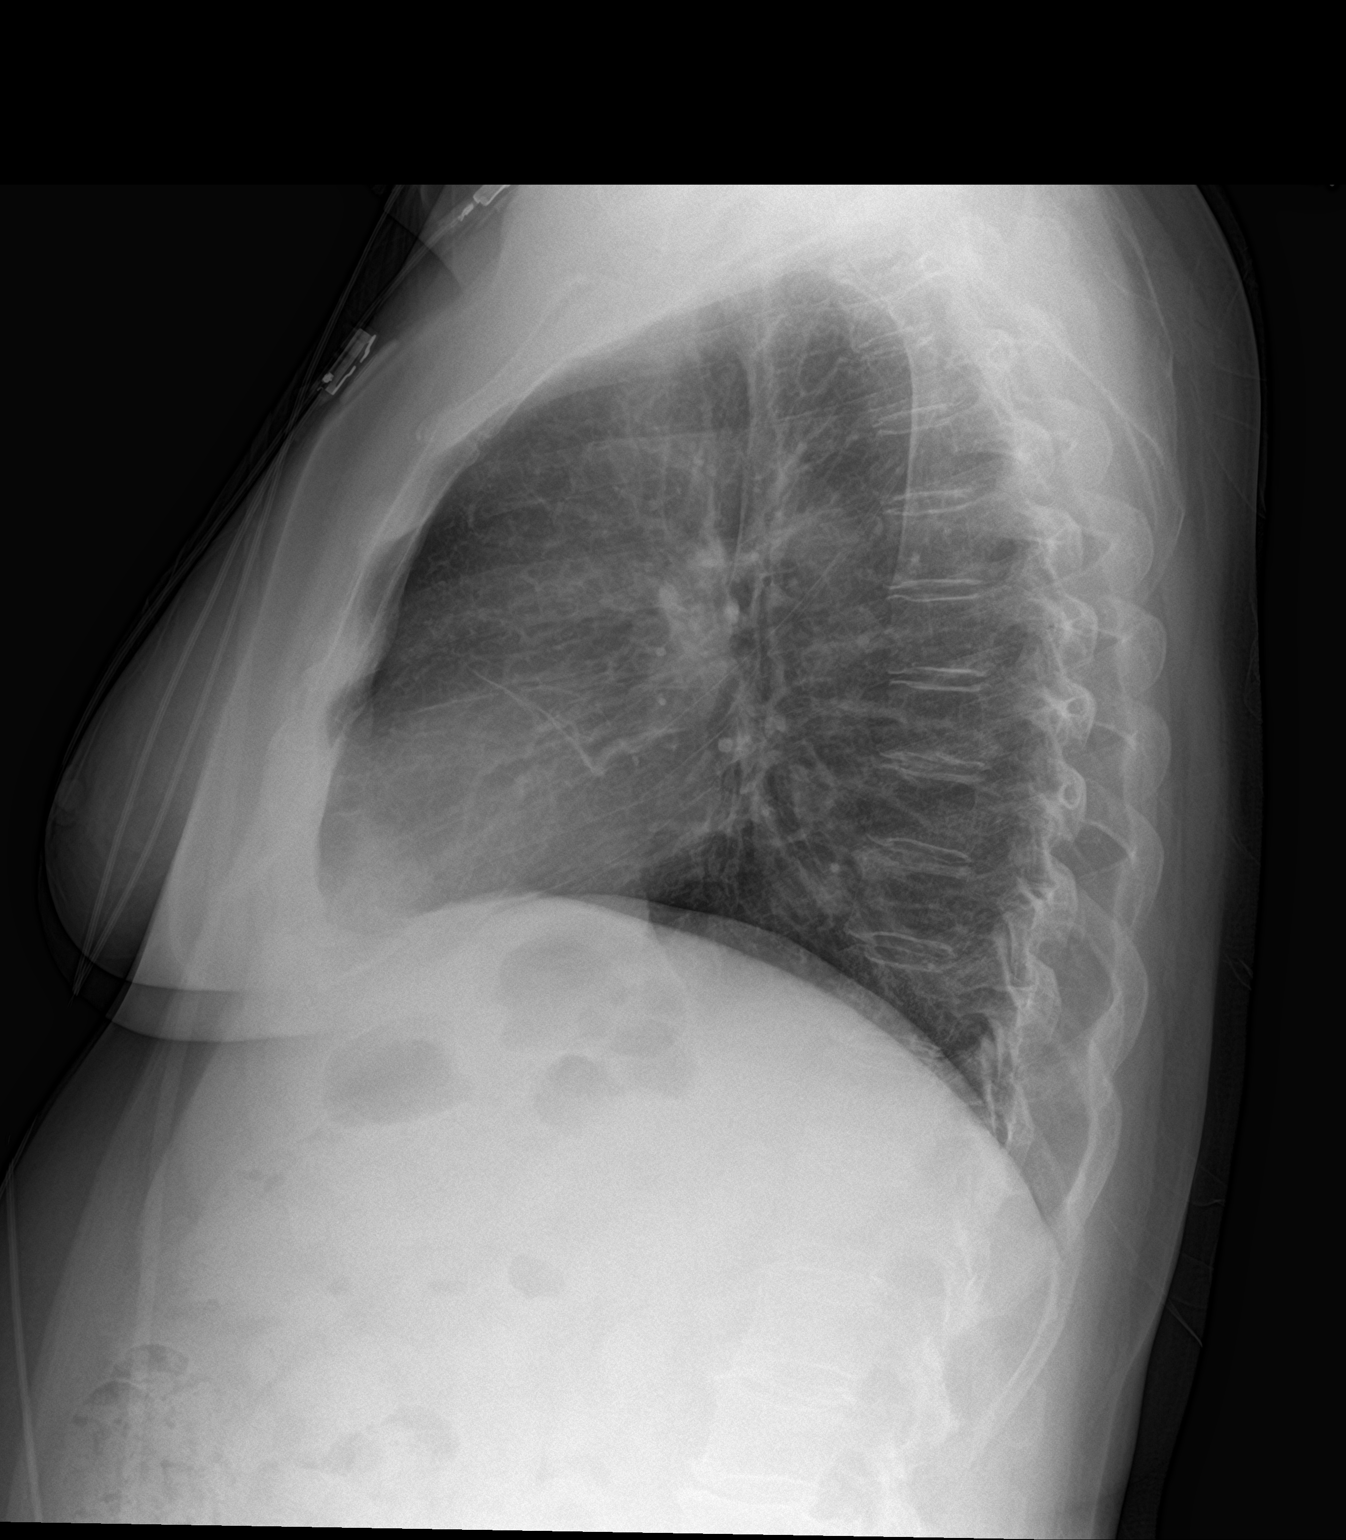

[2 of 2 positions shown; findings below may reference images not displayed]

FINDINGS: The heart, hila, and mediastinum are normal. No pneumothorax. No
pulmonary nodules or masses. No focal infiltrates.
IMPRESSION: No active cardiopulmonary disease.

## 2019-07-18 ENCOUNTER — Other Ambulatory Visit: Payer: Self-pay | Admitting: Allergy & Immunology

## 2019-07-28 ENCOUNTER — Ambulatory Visit (INDEPENDENT_AMBULATORY_CARE_PROVIDER_SITE_OTHER): Payer: Medicare HMO

## 2019-07-28 ENCOUNTER — Other Ambulatory Visit: Payer: Self-pay

## 2019-07-28 DIAGNOSIS — E1129 Type 2 diabetes mellitus with other diabetic kidney complication: Secondary | ICD-10-CM | POA: Diagnosis not present

## 2019-07-28 DIAGNOSIS — J4550 Severe persistent asthma, uncomplicated: Secondary | ICD-10-CM | POA: Diagnosis not present

## 2019-07-28 NOTE — Addendum Note (Signed)
Addended by: [name] on: 07/28/2019 11:38 AM   Modules accepted: Orders

## 2019-07-28 NOTE — Progress Notes (Signed)
I talked to Ms. Kinch while she was here in the office getting her mepolizumab injection.  She had expressed concern about the [place] and [place] vaccinations for COVID-19.  She was open to the [employer].  However, should she does have specific antibody deficiency, I prefer to hold off on any live vaccines.  [employer] does use a viral vector in their vaccine process, therefore I would prefer that she get an mRNA vaccine.  She would like to check to see if she has any COVID-19 antibodies before getting the vaccination.  She reports being quite ill in February 2020 and wonders whether she had COVID-19 at that time.  I did go ahead and order the COVID-19 antibody.  We are going to try to add it to the labs she had drawn this morning.  I doubt at this point that there would be COVID-19 antibodies in her immunoglobulin, as it takes 1 year for plasma to be processed into immunoglobulin to be given to patients.  Once we have this information, she will make a more informed decision regarding vaccination.  In addition, we are changing her to Berna Bue since this is easier to get approved on Medicare.  She has been very stable on mepolizumab for approximately 18 months. We did give her the [place] and Me form for her to complete for [place].  I also told her she needed the Social Security benefits letter.  She does have all of this information at home and will bring it back on Friday for [place] to send to [place].  Salvatore Marvel, MD [facility] [facility] [place]

## 2019-07-30 DIAGNOSIS — J4550 Severe persistent asthma, uncomplicated: Secondary | ICD-10-CM | POA: Diagnosis not present

## 2019-07-31 LAB — SARS-COV-2 ANTIBODIES: SARS-CoV-2 Antibodies: NEGATIVE

## 2019-08-04 DIAGNOSIS — E1159 Type 2 diabetes mellitus with other circulatory complications: Secondary | ICD-10-CM | POA: Diagnosis not present

## 2019-08-04 DIAGNOSIS — B379 Candidiasis, unspecified: Secondary | ICD-10-CM | POA: Diagnosis not present

## 2019-08-04 DIAGNOSIS — I10 Essential (primary) hypertension: Secondary | ICD-10-CM | POA: Diagnosis not present

## 2019-08-09 DIAGNOSIS — D839 Common variable immunodeficiency, unspecified: Secondary | ICD-10-CM | POA: Diagnosis not present

## 2019-08-18 DIAGNOSIS — I10 Essential (primary) hypertension: Secondary | ICD-10-CM | POA: Diagnosis not present

## 2019-08-18 DIAGNOSIS — H5200 Hypermetropia, unspecified eye: Secondary | ICD-10-CM | POA: Diagnosis not present

## 2019-08-18 DIAGNOSIS — E109 Type 1 diabetes mellitus without complications: Secondary | ICD-10-CM | POA: Diagnosis not present

## 2019-08-18 DIAGNOSIS — E7800 Pure hypercholesterolemia, unspecified: Secondary | ICD-10-CM | POA: Diagnosis not present

## 2019-08-18 DIAGNOSIS — H43813 Vitreous degeneration, bilateral: Secondary | ICD-10-CM | POA: Diagnosis not present

## 2019-08-18 DIAGNOSIS — Z961 Presence of intraocular lens: Secondary | ICD-10-CM | POA: Diagnosis not present

## 2019-08-20 ENCOUNTER — Telehealth: Payer: Self-pay | Admitting: *Deleted

## 2019-08-20 NOTE — Telephone Encounter (Signed)
L/M for patient advising approval from [place] for free Fasenra thru patient assistance. I have scheduled delivery for same and she should reach out to [facility] office Wed or Thurs next week to see if same has arrived so she can sch appt to start

## 2019-08-30 ENCOUNTER — Other Ambulatory Visit: Payer: Self-pay

## 2019-08-30 ENCOUNTER — Ambulatory Visit (INDEPENDENT_AMBULATORY_CARE_PROVIDER_SITE_OTHER): Payer: Medicare HMO

## 2019-08-30 DIAGNOSIS — J4550 Severe persistent asthma, uncomplicated: Secondary | ICD-10-CM | POA: Diagnosis not present

## 2019-08-30 MED ORDER — BENRALIZUMAB 30 MG/ML ~~LOC~~ SOSY
30.0000 mg | PREFILLED_SYRINGE | SUBCUTANEOUS | Status: DC
  Administered 2019-08-30 – 2020-09-29 (×8): 30 mg via SUBCUTANEOUS

## 2019-09-06 DIAGNOSIS — D839 Common variable immunodeficiency, unspecified: Secondary | ICD-10-CM | POA: Diagnosis not present

## 2019-09-13 DIAGNOSIS — Z0100 Encounter for examination of eyes and vision without abnormal findings: Secondary | ICD-10-CM | POA: Diagnosis not present

## 2019-09-20 DIAGNOSIS — L820 Inflamed seborrheic keratosis: Secondary | ICD-10-CM | POA: Diagnosis not present

## 2019-09-20 DIAGNOSIS — X32XXXA Exposure to sunlight, initial encounter: Secondary | ICD-10-CM | POA: Diagnosis not present

## 2019-09-20 DIAGNOSIS — L570 Actinic keratosis: Secondary | ICD-10-CM | POA: Diagnosis not present

## 2019-09-27 ENCOUNTER — Other Ambulatory Visit: Payer: Self-pay

## 2019-09-27 ENCOUNTER — Ambulatory Visit (INDEPENDENT_AMBULATORY_CARE_PROVIDER_SITE_OTHER): Payer: Medicare HMO

## 2019-09-27 ENCOUNTER — Other Ambulatory Visit: Payer: Self-pay | Admitting: *Deleted

## 2019-09-27 DIAGNOSIS — J4550 Severe persistent asthma, uncomplicated: Secondary | ICD-10-CM

## 2019-09-27 MED ORDER — TRELEGY ELLIPTA 100-62.5-25 MCG/INH IN AEPB: INHALATION_SPRAY | RESPIRATORY_TRACT | 0 refills | Status: DC

## 2019-09-30 ENCOUNTER — Telehealth: Payer: Self-pay | Admitting: *Deleted

## 2019-09-30 ENCOUNTER — Other Ambulatory Visit: Payer: Self-pay | Admitting: *Deleted

## 2019-09-30 MED ORDER — TRELEGY ELLIPTA 100-62.5-25 MCG/INH IN AEPB: INHALATION_SPRAY | RESPIRATORY_TRACT | 1 refills | Status: DC

## 2019-09-30 NOTE — Telephone Encounter (Signed)
Patient called stating that she was having issues with receiving he Trelegy through [facility]. Advised to patient that I would resend in the prescription to the pharmacy and would give her a sample of Trelegy in the [place] office for her to pick up tomorrow. Patient was due for a follow up visit and has been scheduled to follow up with Dr. Ernst Bowler in the [place] office. Patient verbalized understanding and will be by tomorrow to pick up a sample of Trelegy to get her through until her prescription has been mailed to her house.

## 2019-10-04 NOTE — Telephone Encounter (Signed)
A representative from [facility] called the office in regards to a PA for Trelegy. Call back number [phone] and reference number is [id number].  Please advise.

## 2019-10-04 NOTE — Telephone Encounter (Signed)
Will work on [employer] for [employer].

## 2019-10-05 ENCOUNTER — Other Ambulatory Visit: Payer: Self-pay | Admitting: *Deleted

## 2019-10-05 NOTE — Telephone Encounter (Signed)
PA has been submitted through CoverMyMeds and is currently pending approval or denial.

## 2019-10-05 NOTE — Telephone Encounter (Signed)
PA was denied. Appeal has been filled out and faxed to [phone] and is currently pending.

## 2019-10-06 DIAGNOSIS — D839 Common variable immunodeficiency, unspecified: Secondary | ICD-10-CM | POA: Diagnosis not present

## 2019-10-08 ENCOUNTER — Ambulatory Visit: Payer: Medicare HMO | Admitting: Pulmonary Disease

## 2019-10-08 ENCOUNTER — Encounter: Payer: Self-pay | Admitting: Pulmonary Disease

## 2019-10-08 ENCOUNTER — Other Ambulatory Visit: Payer: Self-pay

## 2019-10-08 DIAGNOSIS — J4550 Severe persistent asthma, uncomplicated: Secondary | ICD-10-CM

## 2019-10-08 DIAGNOSIS — D801 Nonfamilial hypogammaglobulinemia: Secondary | ICD-10-CM | POA: Diagnosis not present

## 2019-10-08 MED ORDER — NYSTATIN 100000 UNIT/ML MT SUSP: 5.0000 mL | Freq: Two times a day (BID) | OROMUCOSAL | 2 refills | Status: DC

## 2019-10-08 NOTE — Telephone Encounter (Signed)
Received fax from [facility] stating that the PA is denied for beyond a 90 day supply but it is approved as a recurring 90 days supply. PA determination form has been labeled and placed in bulk scanning for reference.

## 2019-10-08 NOTE — Progress Notes (Signed)
Subjective:    Patient ID: Kathy Tran, female    DOB: [birth year], 64 y.o.   MRN: [id number]  HPI  64 year old never smoker presents to establish care for asthma, previous patient of Dr. Maryjean Ka. She also sees Dr. 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  There is no childhood history of asthma. Symptom onset was in 2018 after an attack of the flu.  She reports an attack of bronchitis in 2016 Repeated ear infections as a child requiring multiple ear tubes through 1990s and 2000, sinus surgery 1998 Hospitalization 07/2016 for bronchospasm , remained on prednisone for 3 months IgG  398  09/2016 , given IVIG Bronchoscopy 10/2016, 01/2017 for mucous plugs Allergy consultation 2018 >> maintained on IV IgG every 4 weeks - startde 03/2017 , Nucala every 4 weeks and Trelegy, prednisone tapered 05/2017  [facility] pulmonary consultation 07/2017 was reviewed >> felt that chronic prednisone use caused hypogammaglobulinemia  However when IVIG was stopped for 3 months her symptoms returned and she has been maintained on IVIG infusions since. Repeat IgG testing showed normal levels of 794 09/2018 and 838 and 07/2018  She has been maintained on a regimen of Trelegy, Singulair, Benadryl [Zyrtec and Clarinex did not help] she only seldom needs DuoNeb's last use was 7 days ago She reports occasional hoarseness.  Dyspnea fluctuates and is worse with hot weather weather changes pollen season and fall She reports occasional wheezing, no nocturnal symptoms  If she gets thrush, Diflucan seems to not.  Environment-[place] farm, mom and dad smoked has been smokes but not inside the house, they have crowds on their farm, no chickens or birds, dog outside, son is an Animator and has checked out her house for mold. 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 Angio-edema   . Arthritis   . Asthma    Flu march and May 2018  . COPD (chronic obstructive pulmonary disease) ([place])   . Diabetes mellitus type 2, controlled ([place])    x5 yrs  . Eczema   . GERD (gastroesophageal reflux disease)   . Heart murmur   . History of kidney stones   . Hypertension    x 5 yrs  . Hypogammaglobulinemia ([place])   . Recurrent upper respiratory infection (URI)   . Renal disorder    renal stones  . Urticaria    Past Surgical History:  Procedure Laterality Date  . ADENOIDECTOMY    . BIOPSY  04/01/2018   Procedure: BIOPSY;  Surgeon: Rogene Houston, MD;  [facility];  Service: Endoscopy;;  . Mortimer Fries SUSPENSION PLASTY Right 04/13/2019   Procedure: CARPOMETACARPEL [facility]) SUSPENSION PLASTY RIGHT THUMB;  Surgeon: Daryll Brod, MD;  Location: [place];  Service: Orthopedics;  Laterality: Right;  AXILLARY BLOCK  . CATARACT EXTRACTION W/PHACO Left 01/19/2018   Procedure: CATARACT EXTRACTION PHACO  AND INTRAOCULAR LENS PLACEMENT LEFT EYE;  Surgeon: Tonny Branch, MD;  Location: [facility];  Service: Ophthalmology;  Laterality: Left;  CDE: 7.52  . CATARACT EXTRACTION W/PHACO Right 02/23/2018   Procedure: CATARACT EXTRACTION PHACO AND INTRAOCULAR LENS PLACEMENT ([place]);  Surgeon: Tonny Branch, MD;  Location: [facility];  Service: Ophthalmology;  Laterality: Right;  CDE: 5.47  . [place]  . COLONOSCOPY  03/27/2011   Procedure: COLONOSCOPY;  Surgeon: Rogene Houston, MD;   [facility];  Service: Endoscopy;  Laterality: N/A;  2:00  . COLONOSCOPY N/A 04/01/2018   Procedure: COLONOSCOPY;  Surgeon: Rogene Houston, MD;  [facility];  Service: Endoscopy;  Laterality: N/A;  1030  . CYSTOSCOPY W/ URETERAL STENT PLACEMENT Left 11/08/2015   Procedure: CYSTOSCOPY, URETEROSCOPY, WITH STONE BASKET EXTRACTION;  Surgeon: Raynelle Bring, MD;  [facility];  Service: Urology;  Laterality: Left;  . CYSTOSCOPY/RETROGRADE/URETEROSCOPY/STONE EXTRACTION WITH BASKET Right 07/03/2015   Procedure: CYSTOSCOPY/RETROGRADE/URETEROSCOPY/STONE EXTRACTION WITH BASKET;  Surgeon: Irine Seal, MD;  [facility];  Service: Urology;  Laterality: Right;  . ENDOMETRIAL ABLATION    . FLEXIBLE BRONCHOSCOPY Bilateral 10/31/2016   Procedure: FLEXIBLE BRONCHOSCOPY WITH PROPOFOL;  Surgeon: Sinda Du, MD;  [facility];  Service: Cardiopulmonary;  Laterality: Bilateral;  . FLEXIBLE BRONCHOSCOPY N/A 01/17/2017   Procedure: FLEXIBLE BRONCHOSCOPY WITH PROPOFOL;  Surgeon: Sinda Du, MD;  [facility];  Service: Cardiopulmonary;  Laterality: N/A;  . KIDNEY STONE SURGERY     multiple  . SHOULDER SURGERY    . SINOSCOPY    . TONSILLECTOMY    . TUBAL LIGATION  1991  . TYMPANOSTOMY TUBE PLACEMENT      Allergies  Allergen Reactions  . Sulfa Antibiotics Anaphylaxis    Social History   Socioeconomic History  . Marital status: Married    Spouse name: Not on file  . Number of children: Not on file  . Years of education: Not on file  . Highest education level: Not on file  Occupational History  . Not on file  Tobacco Use  . Smoking status: Never Smoker  . Smokeless tobacco: Never Used  Substance and Sexual Activity  . Alcohol use: Not Currently  . Drug use: No  . Sexual activity: Yes    Birth control/protection: Surgical, Post-menopausal    Comment: tubal and ablation  Other Topics Concern  . Not on file  Social History Narrative  . Not  on file   Social Determinants of Health   Financial Resource Strain:   . Difficulty of Paying Living Expenses:   Food Insecurity:   . Worried About Charity fundraiser in the Last Year:   . Arboriculturist in the Last Year:   Transportation Needs:   . Film/video editor (Medical):   Marland Kitchen Lack of Transportation (Non-Medical):   Physical Activity:   . Days of Exercise per Week:   . Minutes of Exercise per Session:   Stress:   . Feeling of Stress :   Social Connections:   . Frequency of Communication with Friends and Family:   . Frequency of Social Gatherings with Friends and Family:   . Attends Religious Services:   . Active Member of Clubs or Organizations:   . Attends Archivist Meetings:   Marland Kitchen Marital Status:   Intimate Partner Violence:   . Fear of Current or Ex-Partner:   . Emotionally Abused:   Marland Kitchen Physically Abused:   . Sexually  Abused:     Family History  Problem Relation Age of Onset  . COPD Mother   . Cancer Father     Review of Systems   Constitutional: negative for anorexia, fevers and sweats  Eyes: negative for irritation, redness and visual disturbance  Ears, nose, mouth, throat, and face: negative for earaches, epistaxis, nasal congestion and sore throat   Cardiovascular: negative for chest pain, dyspnea, lower extremity edema, orthopnea, palpitations and syncope  Gastrointestinal: negative for abdominal pain, constipation, diarrhea, melena, nausea and vomiting  Genitourinary:negative for dysuria, frequency and hematuria  Hematologic/lymphatic: negative for bleeding, easy bruising and lymphadenopathy  Musculoskeletal:negative for arthralgias, muscle weakness and stiff joints  Neurological: negative for coordination problems, gait problems, headaches and weakness  Endocrine: negative for diabetic symptoms including polydipsia, polyuria and weight loss     Objective:   Physical Exam  Gen. Pleasant, well-nourished, in no distress, normal  affect ENT - no pallor,icterus, no post nasal drip, no thrush Neck: No JVD, no thyromegaly, no carotid bruits Lungs: no use of accessory muscles, no dullness to percussion, clear without rales or rhonchi  Cardiovascular: Rhythm regular, heart sounds  normal, no murmurs or gallops, no peripheral edema Abdomen: soft and non-tender, no hepatosplenomegaly, BS normal. Musculoskeletal: No deformities, no cyanosis or clubbing Neuro:  alert, non focal       Assessment & Plan:

## 2019-10-08 NOTE — Assessment & Plan Note (Signed)
She probably needs a recheck of her IgG levels. Last few times they seem to have checked out normal levels so not convinced of the diagnosis of hypogammaglobulinemia.  She does seem to have repeated ear infections in the past but none recently no pneumonia or skin infections

## 2019-10-08 NOTE — Patient Instructions (Addendum)
Nystatin 77ml PO twice daily swish & swallow  as needed x 120 ml x 2 refills  Stay on trelegy & Berna Bue

## 2019-10-08 NOTE — Assessment & Plan Note (Signed)
I explained to her that she has asthma and not COPD.  Her variable lung function and spirometry is consistent with asthma. Since this is her first visit with me and she has a long history, I did not want to make any changes today but in the future I would submit that steroid/LABA combination would be better than triple combination for her. She does seem to have frequent attacks of thrush and I wonder if DPI is not the best thing for her and again Symbicort and Dulera may be a better option in the future. She will continue on Singulair and antihistaminic such as Benadryl. With low eosinophil count and IgE, she seems to be more T2 low category and as such I am not sure Berna Bue or Nucala is the right option for her.  Perhaps she would do better on Dupixent or the newer biologic for T2 low asthma.

## 2019-10-13 ENCOUNTER — Ambulatory Visit (INDEPENDENT_AMBULATORY_CARE_PROVIDER_SITE_OTHER): Payer: Medicare HMO | Admitting: Allergy & Immunology

## 2019-10-13 ENCOUNTER — Other Ambulatory Visit: Payer: Self-pay

## 2019-10-13 ENCOUNTER — Encounter: Payer: Self-pay | Admitting: Allergy & Immunology

## 2019-10-13 VITALS — BP 128/70 | HR 87 | Resp 18 | Ht 62.0 in

## 2019-10-13 DIAGNOSIS — J4550 Severe persistent asthma, uncomplicated: Secondary | ICD-10-CM | POA: Diagnosis not present

## 2019-10-13 DIAGNOSIS — D808 Other immunodeficiencies with predominantly antibody defects: Secondary | ICD-10-CM | POA: Diagnosis not present

## 2019-10-13 DIAGNOSIS — J310 Chronic rhinitis: Secondary | ICD-10-CM

## 2019-10-13 NOTE — Patient Instructions (Addendum)
1. 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 - We are not going to make any medication changes at this time. - Use up your Trelegy and then give [place] a call and we can change your medications to something that might cause less thrush.  - Daily controller medication(s): Trelegy one puff once in the morning + Singulair 10mg  daily + Fasenra  - Rescue medications: ProAir 4 puffs every 4-6 hours as needed or DuoNeb nebulizer one vial every 4-6 hours as needed - Asthma control goals:  * Full participation in all desired activities (may need albuterol before activity) * Albuterol use two time or less a week on average (not counting use with activity) * Cough interfering with sleep two time or less a month * Oral steroids no more than once a year * No hospitalizations    2. Chronic non-allergic rhinitis - Add on Xyzal (levocetirizine) 5mg  daily. - Continue with Benadryl 25mg  at night.   3. 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  - Continue with all of the premedications, including hydration.  - We will get an IgG level before the next infusion as well as a metabolic panel.   4. Return in about 3 months (around 01/13/2020). This can be an in-person, a virtual Webex or a telephone follow up visit.   Please inform us of any Emergency Department visits, hospitalizations, or changes in symptoms. Call us before going to the ED for breathing or allergy symptoms since we might be able to fit you in for a sick visit. Feel free to contact us anytime with any questions, problems, or concerns.  It was a pleasure to see you again today!  Websites that have reliable patient information: 1. American Academy of Asthma, Allergy, and Immunology: www.aaaai.org 2. Food Allergy Research and Education (FARE): foodallergy.org 3. Mothers of Asthmatics: http://www.asthmacommunitynetwork.org 4. American College of Allergy, Asthma, and  Immunology: www.acaai.org   COVID-19 Vaccine Information can be found at: ShippingScam.co.uk For questions related to vaccine distribution or appointments, please email [email or web address] or call [phone].     "Like" [place] on Facebook and Instagram for our latest updates!       HAPPY SPRING!  Make sure you are registered to vote! If you have moved or changed any of your contact information, you will need to get this updated before voting!  In some cases, you MAY be able to register to vote online: [email or web address]

## 2019-10-13 NOTE — Progress Notes (Signed)
FOLLOW UP  Date of Service/Encounter:  10/13/19   Assessment:   Severe persistent asthma without complication- stable on Nucala monthly  Rash - ? 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 Arvie presents for a follow-up visit.  She does have a lot of questions from her first visit with Dr. Elsworth Soho, that her new pulmonologist. Three of the main things that he brought up for her biologic, her controller medication, and her immunoglobulin.  I agree that we can definitely stop the Trelegy.  I was not aware that she was having a lot of thrush, as this was all managed by her PCP.  I think we can definitely decrease that to something like Symbicort or Dulera.  She just got a 66-month supply of Trelegy, so she is going to finish that before we make that change.  Regarding her biologic, she has been very stable on mepolizumab and recently transitioned to benralizumab due to better coverage on her Medicare plan.  We started this in September 2018, as she had an eosinophil count of 300 around that time.  I am certainly up to changing, but if she has been stable for this long I would slightly much rather continue with the current course.  She is in agreement with this.  Her spirometry is actually normal today, which is fairly rare for the time that I have seen her.  Regarding her immunoglobulin replacement, she is being treated for specific antibody deficiency and not just hypogammaglobulinemia.  There was a point in time when I thought that her low IgG was secondary to her prolonged prednisone taper.  However, she was only protective to 1 out of 23 serotypes of Streptococcus pneumonia when we worked her up, clearly confirming the specific antibody deficiency.  In addition, we actually did try to wean her immunoglobulin in May 2019.  This unfortunately landed her in the hospital around August 2019 for pneumonia and we  made the decision to reinstate her immunoglobulin treatment.  She has been very stable since that time.  I think I am still comfortable attempting to wean immunoglobulin, but I am less excited about doing this in the middle of the pandemic.  I would like to take her off of immunoglobulin during the summer months, as this time of the year is less likely to have influenza and other viruses circulating.  We will wait until May 2022 before trialing her off of immunoglobulin.  She is in agreement with this plan.   Plan/Recommendations:   1. 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 - We are not going to make any medication changes at this time. - Use up your Trelegy and then give [place] a call and we can change your medications to something that might cause less thrush.  - Daily controller medication(s): Trelegy one puff once in the morning + Singulair 10mg  daily + Fasenra  - Rescue medications: ProAir 4 puffs every 4-6 hours as needed or DuoNeb nebulizer one vial every 4-6 hours as needed - Asthma control goals:  * Full participation in all desired activities (may need albuterol before activity) * Albuterol use two time or less a week on average (not counting use with activity) * Cough interfering with sleep two time or less a month * Oral steroids no more than once a year * No hospitalizations    2. Chronic non-allergic rhinitis - Add  on Xyzal (levocetirizine) 5mg  daily. - Continue with Benadryl 25mg  at night.   3. 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  - Continue with all of the premedications, including hydration.  - We will get an IgG level before the next infusion as well as a metabolic panel.   4. Return in about 3 months (around 01/13/2020). This can be an in-person, a virtual Webex or a telephone follow up visit.   Subjective:   ALEKSIS THRUN is a 64 y.o. female presenting today for follow up of  Chief  Complaint  Patient presents with  . Asthma    [name] has a history of the following: Patient Active Problem List   Diagnosis Date Noted  . Specific antibody deficiency with normal IG concentration and normal number of B cells ([place]) 02/27/2018  . Hx of colonic polyps 01/07/2018  . Postmenopausal 07/02/2017  . Hypogammaglobulinemia ([place]) 04/29/2017  . Recurrent infections 04/29/2017  . Non-allergic rhinitis 04/29/2017  . Severe persistent asthma without complication [id number]  . Essential hypertension, benign 03/20/2017  . Asthma exacerbation 12/26/2016  . Bronchitis 09/25/2016  . Asthmatic bronchitis 07/18/2016  . DM type 2 causing vascular disease ([place]) 07/18/2016  . GERD (gastroesophageal reflux disease) 07/18/2016  . Mixed hyperlipidemia 07/18/2016  . History of colonic polyps 06/12/2016    History obtained from: chart review and patient.  Viva is a 64 y.o. female presenting for a follow up visit.  In the interim, she did establish care with Dr. Elsworth Soho of pulmonology.  She was previously followed by Dr. Luan Pulling, who is since retired.  He felt that she had more of a Th2-low asthma which might be more amenable to [place] or teplizumab.  He also is unsure of her diagnosis of hypogammaglobulinemia. She is going to see him again in October 2021.  Asthma/Respiratory Symptom History: Around 1-2 weeks ago, she has having some shortness of breath with exertion and tightness with weather changes. She was using her albuterol 3-4 times daily with breathing treatments as well. This weeks she has been doing well and not had to use her albuterol inhaler and denies any coughing, wheezing, tightness in chest or shortness of breath. She remains on her Trelegy and Singulair. She just changed from [place] in March 2021 to [facility] in April 2021.  She tells me this is from better coverage on her Medicare plan.  Allergic Rhinitis Symptom History: She reports postnasal drip and nasal congestion with  weather changes. She denies any rhinorrhea. She has been on Claritin and Zyrtec without improvement. She is not on a nose spray.  Recurrent Infection Symptom History: She remains on her infusions.  She is not having any issues with the infusions themselves.  She has had no antibiotics since the last time we saw her.  Review of her immunoglobulin approval shows that she actually had specific antibody deficiency, which is how we got the immunoglobulin replacement approved.  She has not received the COVID-19 vaccine.  We have discussed this on a couple of occasions, but she remains very nervous.  She just does not appreciate the speed with which it came to market.  We have discussed these questions in the past.  We did finally convince her to sign up to get the vaccine at [facility] or [facility].  She could then drive over to our office and spent some more time under our observation.  Otherwise, there have been no changes to her past medical history, surgical history, family history, or  social history.    Review of Systems  Constitutional: Negative for chills and fever.  HENT: Positive for congestion.   Respiratory: Negative for cough, shortness of breath and wheezing.   Skin: Negative for itching and rash.       Objective:   Blood pressure 128/70, pulse 87, resp. rate 18, height 5\' 2"  (1.575 m), SpO2 97 %. Body mass index is 28.17 kg/m.   Physical Exam:  Physical Exam  Constitutional: She is oriented to person, place, and time. She appears well-developed and well-nourished.  HENT:  Head: Normocephalic and atraumatic.  Eyes: Conjunctivae are normal.  Cardiovascular: Normal rate, regular rhythm and normal heart sounds.  Respiratory: Effort normal and breath sounds normal.  Lungs clear to auscultation.  Musculoskeletal:     Cervical back: Neck supple.  Neurological: She is alert and oriented to person, place, and time.  Skin: Skin is warm.  Psychiatric: She has a normal mood and affect. Her  behavior is normal. Judgment and thought content normal.     Diagnostic studies:    Spirometry: results normal (FEV1: 1.90/82%, FVC: 2.41/80%, FEV1/FVC: 79%).    Spirometry consistent with normal pattern.    Thank you for the opportunity to care for this patient. Please let me know if you have any questions.   Althea Charon, FNP [facility]       I performed a history and physical examination of the patient and discussed her management with the Nurse Practitioner. I reviewed the Nurse Practitioner's note and agree with the documented findings and plan of care. The note in its entirety was edited by myself, including the physical exam, assessment, and plan.     Salvatore Marvel, MD  [facility]

## 2019-10-14 ENCOUNTER — Encounter: Payer: Self-pay | Admitting: Allergy & Immunology

## 2019-10-18 ENCOUNTER — Telehealth: Payer: Self-pay

## 2019-10-18 NOTE — Telephone Encounter (Signed)
PA initiated for Trelegy through [email or web address] and awaiting approval.

## 2019-10-19 DIAGNOSIS — D808 Other immunodeficiencies with predominantly antibody defects: Secondary | ICD-10-CM | POA: Diagnosis not present

## 2019-10-20 LAB — CMP14+EGFR
ALT: 18 IU/L (ref 0–32)
AST: 17 IU/L (ref 0–40)
Albumin/Globulin Ratio: 1.7 (ref 1.2–2.2)
Albumin: 4.4 g/dL (ref 3.8–4.8)
Alkaline Phosphatase: 67 IU/L (ref 48–121)
BUN/Creatinine Ratio: 21 (ref 12–28)
BUN: 20 mg/dL (ref 8–27)
Bilirubin Total: 0.5 mg/dL (ref 0.0–1.2)
CO2: 23 mmol/L (ref 20–29)
Calcium: 9.3 mg/dL (ref 8.7–10.3)
Chloride: 103 mmol/L (ref 96–106)
Creatinine, Ser: 0.96 mg/dL (ref 0.57–1.00)
GFR calc Af Amer: 74 mL/min/{1.73_m2} (ref >59)
GFR calc non Af Amer: 64 mL/min/{1.73_m2} (ref >59)
Globulin, Total: 2.6 g/dL (ref 1.5–4.5)
Glucose: 167 mg/dL — ABNORMAL HIGH (ref 65–99)
Potassium: 4.2 mmol/L (ref 3.5–5.2)
Sodium: 141 mmol/L (ref 134–144)
Total Protein: 7 g/dL (ref 6.0–8.5)

## 2019-10-20 LAB — IGG: IgG (Immunoglobin G), Serum: 1067 mg/dL (ref 586–1602)

## 2019-10-21 NOTE — Telephone Encounter (Signed)
[employer] has approved coverage for the drug requested under your Part D benefit for/through 05/12/2020. Your plan covers a recurring 30 or 90 day supply. Please note a recurring supply above 90 days is not covered. This is explained in your Evidence of Coverage under the heading How can you get a long-term supply of drugs. This book is your very detailed document with the full, legal description of your benefits and costs. Please discuss this with your provider or pharmacy.

## 2019-10-25 ENCOUNTER — Ambulatory Visit: Payer: Self-pay

## 2019-10-29 ENCOUNTER — Ambulatory Visit (INDEPENDENT_AMBULATORY_CARE_PROVIDER_SITE_OTHER): Payer: Medicare HMO

## 2019-10-29 ENCOUNTER — Other Ambulatory Visit: Payer: Self-pay

## 2019-10-29 DIAGNOSIS — J4550 Severe persistent asthma, uncomplicated: Secondary | ICD-10-CM

## 2019-11-02 DIAGNOSIS — D839 Common variable immunodeficiency, unspecified: Secondary | ICD-10-CM | POA: Diagnosis not present

## 2019-11-08 DIAGNOSIS — E1159 Type 2 diabetes mellitus with other circulatory complications: Secondary | ICD-10-CM | POA: Diagnosis not present

## 2019-11-16 DIAGNOSIS — E1159 Type 2 diabetes mellitus with other circulatory complications: Secondary | ICD-10-CM | POA: Diagnosis not present

## 2019-11-16 DIAGNOSIS — Z6829 Body mass index (BMI) 29.0-29.9, adult: Secondary | ICD-10-CM | POA: Diagnosis not present

## 2019-11-16 DIAGNOSIS — I10 Essential (primary) hypertension: Secondary | ICD-10-CM | POA: Diagnosis not present

## 2019-12-01 DIAGNOSIS — D839 Common variable immunodeficiency, unspecified: Secondary | ICD-10-CM | POA: Diagnosis not present

## 2019-12-13 DIAGNOSIS — L738 Other specified follicular disorders: Secondary | ICD-10-CM | POA: Diagnosis not present

## 2019-12-13 DIAGNOSIS — L700 Acne vulgaris: Secondary | ICD-10-CM | POA: Diagnosis not present

## 2019-12-13 DIAGNOSIS — L820 Inflamed seborrheic keratosis: Secondary | ICD-10-CM | POA: Diagnosis not present

## 2019-12-21 ENCOUNTER — Other Ambulatory Visit (HOSPITAL_COMMUNITY): Payer: Self-pay | Admitting: Internal Medicine

## 2019-12-21 DIAGNOSIS — Z1231 Encounter for screening mammogram for malignant neoplasm of breast: Secondary | ICD-10-CM

## 2019-12-24 ENCOUNTER — Ambulatory Visit (INDEPENDENT_AMBULATORY_CARE_PROVIDER_SITE_OTHER): Payer: Medicare HMO

## 2019-12-24 ENCOUNTER — Other Ambulatory Visit: Payer: Self-pay

## 2019-12-24 DIAGNOSIS — J4550 Severe persistent asthma, uncomplicated: Secondary | ICD-10-CM

## 2019-12-29 DIAGNOSIS — D839 Common variable immunodeficiency, unspecified: Secondary | ICD-10-CM | POA: Diagnosis not present

## 2020-01-03 ENCOUNTER — Other Ambulatory Visit: Payer: Self-pay | Admitting: Allergy & Immunology

## 2020-01-07 ENCOUNTER — Ambulatory Visit (HOSPITAL_COMMUNITY): Payer: Medicare HMO

## 2020-01-14 ENCOUNTER — Encounter: Payer: Self-pay | Admitting: Allergy & Immunology

## 2020-01-14 ENCOUNTER — Ambulatory Visit (INDEPENDENT_AMBULATORY_CARE_PROVIDER_SITE_OTHER): Payer: Medicare HMO | Admitting: Allergy & Immunology

## 2020-01-14 ENCOUNTER — Other Ambulatory Visit: Payer: Self-pay

## 2020-01-14 VITALS — BP 126/72 | HR 80 | Resp 18 | Wt 155.8 lb

## 2020-01-14 DIAGNOSIS — J0100 Acute maxillary sinusitis, unspecified: Secondary | ICD-10-CM | POA: Diagnosis not present

## 2020-01-14 DIAGNOSIS — D806 Antibody deficiency with near-normal immunoglobulins or with hyperimmunoglobulinemia: Secondary | ICD-10-CM

## 2020-01-14 DIAGNOSIS — J4550 Severe persistent asthma, uncomplicated: Secondary | ICD-10-CM | POA: Diagnosis not present

## 2020-01-14 DIAGNOSIS — D808 Other immunodeficiencies with predominantly antibody defects: Secondary | ICD-10-CM

## 2020-01-14 MED ORDER — BUDESONIDE 0.5 MG/2ML IN SUSP: RESPIRATORY_TRACT | 5 refills | Status: DC

## 2020-01-14 MED ORDER — FLUCONAZOLE 100 MG PO TABS: 100.0000 mg | ORAL_TABLET | Freq: Every day | ORAL | 3 refills | Status: AC

## 2020-01-14 MED ORDER — AMOXICILLIN-POT CLAVULANATE 875-125 MG PO TABS: 1.0000 | ORAL_TABLET | Freq: Two times a day (BID) | ORAL | 0 refills | Status: AC

## 2020-01-14 NOTE — Patient Instructions (Addendum)
1. 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 - Add on Pulmicort nebulizer treatments twice daily for 1-2 weeks to keep you from needing prednisone (but do NOT hesitate to call us if you think that you need prednisone).  - Daily controller medication(s): Trelegy one puff once in the morning + Singulair 10mg  daily + Fasenra  - Rescue medications: ProAir 4 puffs every 4-6 hours as needed or DuoNeb nebulizer one vial every 4-6 hours as needed - Asthma control goals:  * Full participation in all desired activities (may need albuterol before activity) * Albuterol use two time or less a week on average (not counting use with activity) * Cough interfering with sleep two time or less a month * Oral steroids no more than once a year * No hospitalizations    2. Chronic non-allergic rhinitis - Continue with Xyzal (levocetirizine) 5mg  daily. - Continue with Benadryl 25mg  at night.  - Add on Augmentin twice daily for two weeks. - Diflucan refilled as well in case you need that,   3. 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  - Continue with all of the premedications, including hydration.  - We will get an IgG level before the next infusion as well as a COVID19 spike antibody titer to make sure that you reacted to the vaccination.  4. No follow-ups on file. This can be an in-person, a virtual Webex or a telephone follow up visit.   Please inform us of any Emergency Department visits, hospitalizations, or changes in symptoms. Call us before going to the ED for breathing or allergy symptoms since we might be able to fit you in for a sick visit. Feel free to contact us anytime with any questions, problems, or concerns.  It was a pleasure to see you again today! I hope your husband makes a good recovery.   Websites that have reliable patient information: 1. American Academy of Asthma, Allergy, and Immunology:  www.aaaai.org 2. Food Allergy Research and Education (FARE): foodallergy.org 3. Mothers of Asthmatics: http://www.asthmacommunitynetwork.org 4. American College of Allergy, Asthma, and Immunology: www.acaai.org   COVID-19 Vaccine Information can be found at: ShippingScam.co.uk For questions related to vaccine distribution or appointments, please email [email or web address] or call [phone].     "Like" [place] on Facebook and Instagram for our latest updates!        Make sure you are registered to vote! If you have moved or changed any of your contact information, you will need to get this updated before voting!  In some cases, you MAY be able to register to vote online: [email or web address]

## 2020-01-14 NOTE — Progress Notes (Signed)
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 - Add on Pulmicort nebulizer treatments twice daily for 1-2 weeks to keep you from needing prednisone (but do NOT hesitate to call us if you think that you need prednisone).  - Daily controller medication(s): Trelegy one puff once in the morning + Singulair 65m daily + Fasenra  - Rescue medications: ProAir 4 puffs every 4-6 hours as needed or DuoNeb nebulizer one vial every 4-6 hours as needed - Asthma control goals:  * Full participation in all desired activities (may need albuterol before activity) * Albuterol use two time or less a week on average (not counting use with activity) * Cough interfering with sleep two time or less a month * Oral steroids no more than once a year * No hospitalizations    2. Chronic non-allergic rhinitis - Continue with Xyzal (levocetirizine) 584mdaily. - Continue with Benadryl 2536mt night.  - Add on Augmentin twice daily for two weeks. - Diflucan refilled as well in case you need that,   3. 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  - Continue with all of the premedications, including hydration.  - We will get an IgG level before the next infusion as well as a COVID19 spike antibody titer to make sure that you reacted to the vaccination.  4. Follow up in four months or earlier if needed.  Subjective:   Kathy Tran a 63 54o. female presenting today for follow up of  Chief Complaint  Patient presents with  . Asthma    Kathy Tran a history of the  following: Patient Active Problem List   Diagnosis Date Noted  . 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 Hx of colonic polyps 01/07/2018  . Postmenopausal 07/02/2017  . Hypogammaglobulinemia ([facility]/18/2018  . Recurrent infections 04/29/2017  . Non-allergic rhinitis 04/29/2017  . Severe persistent asthma without complication 12/88/03/314 Essential hypertension, benign 03/20/2017  . Asthma exacerbation 12/26/2016  . Bronchitis 09/25/2016  . Asthmatic bronchitis 07/18/2016  . DM type 2 causing vascular disease ([facility]3/12/2016  . GERD (gastroesophageal reflux disease) 07/18/2016  . Mixed hyperlipidemia 07/18/2016  . History of colonic polyps 06/12/2016    History obtained from: chart review and patient.  Kathy Tran a 63 1o. female presenting for a follow up visit.  She is well-known to this practice and has a history of specific antibody deficiency as well as severe persistent asthma.  Her past history has been detailed and other notes.  At the last visit in June 2021, she had recently establish care with Dr. AlvElsworth Soho pulmonology.  There were concerns about his idea of stopping her immunoglobulin treatment, so we discussed at length the reasoning for the immunoglobulin and I discussed this with Dr. AlvElsworth SohoHe was perfectly fine and is continuing with the same plan.  Since last visit, she has done very well.  She did come to our office [facility] this for about an hour until she felt comfortable.  Evidently she got the second shot and did not feel the need to come and see [place], which was perfectly fine.  She reports that whne the pressure drops, she starts getting tight and raspy. She has been doing her breathing treatments 1-2 times per day. She has been on her Telegy of course and her rescue inhaler. This combination is helping. She has not done a breathing test. She has been SOB,  but certainly to terrible.  Overall, she is still able to keep up with her activities of daily living and has been breathing much better than she was when I first met her.  She remains on her Berna Bue which seems to be controlling her symptoms very well.  She does report some sinus issues over the last 24 hours. She thinks that this I the typical seasonal stuff. She has no fever. She can smell and taste.  She has not been around anyone with known COVID-19, although she did go to the most recent event at the [facility].  She wore her mask and everything of course was outdoors.  She is not sure about who is vaccinated and who is not, but she seems that most people are not.  She has not had any fevers with this.  She is wondering whether she needs antibiotics.  She has not had antibiotics in well over 6 months.  She does have thrush. She has nystatin and she uses that for a few days. She has Diflucan at home which her PCP refills.   She remains on her IVIG.  This is going well.  She tolerates this without adverse events.  She denies any side effects. HgbA1c has been hovering around 7. They are happy with that.  Otherwise, there have been no changes to her past medical history, surgical history, family history, or social history.    Review of Systems  Constitutional: Negative.  Negative for chills, fever, malaise/fatigue and weight loss.  HENT: Negative.  Negative for congestion, ear discharge, ear pain and sinus pain.   Eyes: Negative for pain, discharge and redness.  Respiratory: Negative for cough, sputum production, shortness of breath and wheezing.   Cardiovascular: Negative.  Negative for chest pain and palpitations.  Gastrointestinal: Negative for abdominal pain, constipation, diarrhea, heartburn, nausea and vomiting.  Skin: Negative.  Negative for itching and rash.  Neurological: Negative for dizziness and headaches.  Endo/Heme/Allergies: Negative for environmental allergies. Does not  bruise/bleed easily.       Objective:   Blood pressure 126/72, pulse 80, resp. rate 18, weight 155 lb 12.8 oz (70.7 kg), SpO2 98 %. Body mass index is 28.5 kg/m.   Physical Exam:  Physical Exam Constitutional:      Appearance: She is well-developed.     Comments: Pleasant female.  Talkative.  HENT:     Head: Normocephalic and atraumatic.     Right Ear: Tympanic membrane, ear canal and external ear normal.     Left Ear: Tympanic membrane, ear canal and external ear normal.     Nose: No nasal deformity, septal deviation, mucosal edema or rhinorrhea.     Right Turbinates: Enlarged, swollen and pale.     Left Turbinates: Enlarged, swollen and pale.     Right Sinus: No maxillary sinus tenderness or frontal sinus tenderness.     Left Sinus: No maxillary sinus tenderness or frontal sinus tenderness.     Mouth/Throat:     Mouth: Mucous membranes are not pale and not dry.     Pharynx:  Uvula midline.  Eyes:     General:        Right eye: No discharge.        Left eye: No discharge.     Conjunctiva/sclera: Conjunctivae normal.     Right eye: Right conjunctiva is not injected. No chemosis.    Left eye: Left conjunctiva is not injected. No chemosis.    Pupils: Pupils are equal, round, and reactive to light.  Cardiovascular:     Rate and Rhythm: Normal rate and regular rhythm.     Heart sounds: Normal heart sounds.  Pulmonary:     Effort: Pulmonary effort is normal. No tachypnea, accessory muscle usage or respiratory distress.     Breath sounds: Normal breath sounds. No wheezing, rhonchi or rales.     Comments: Moving air well in all lung fields.  No increased work of breathing. Chest:     Chest wall: No tenderness.  Lymphadenopathy:     Cervical: No cervical adenopathy.  Skin:    Coloration: Skin is not pale.     Findings: No abrasion, erythema, petechiae or rash. Rash is not papular, urticarial or vesicular.     Comments: No eczematous or urticarial lesions noted.   Neurological:     Mental Status: She is alert.  Psychiatric:        Behavior: Behavior is cooperative.      Diagnostic studies:    Spirometry: results normal (FEV1: 1.87/81%, FVC: 2.50/83%, FEV1/FVC: 75%).    Spirometry consistent with normal pattern.    Allergy Studies: none        Salvatore Marvel, MD  [facility] and [place] [facility] [place]

## 2020-01-16 ENCOUNTER — Encounter: Payer: Self-pay | Admitting: Allergy & Immunology

## 2020-01-21 DIAGNOSIS — D808 Other immunodeficiencies with predominantly antibody defects: Secondary | ICD-10-CM | POA: Diagnosis not present

## 2020-01-22 LAB — SARS-COV-2 SEMI-QUANTITATIVE TOTAL ANTIBODY, SPIKE
SARS-CoV-2 Semi-Quant Total Ab: >2500 U/mL (ref <0.8)
SARS-CoV-2 Spike Ab Interp: POSITIVE

## 2020-01-22 LAB — IGG: IgG (Immunoglobin G), Serum: 880 mg/dL (ref 586–1602)

## 2020-01-29 DIAGNOSIS — D839 Common variable immunodeficiency, unspecified: Secondary | ICD-10-CM | POA: Diagnosis not present

## 2020-02-03 ENCOUNTER — Telehealth: Payer: Self-pay

## 2020-02-03 MED ORDER — FLUCONAZOLE 100 MG PO TABS: ORAL_TABLET | ORAL | 5 refills | Status: DC

## 2020-02-03 NOTE — Telephone Encounter (Signed)
Refill requested for Diflucan by [facility].

## 2020-02-09 ENCOUNTER — Other Ambulatory Visit: Payer: Self-pay | Admitting: *Deleted

## 2020-02-09 MED ORDER — FLUCONAZOLE 100 MG PO TABS: ORAL_TABLET | ORAL | 5 refills | Status: DC

## 2020-02-18 ENCOUNTER — Other Ambulatory Visit: Payer: Self-pay

## 2020-02-18 ENCOUNTER — Ambulatory Visit (INDEPENDENT_AMBULATORY_CARE_PROVIDER_SITE_OTHER): Payer: Medicare HMO

## 2020-02-18 DIAGNOSIS — J4550 Severe persistent asthma, uncomplicated: Secondary | ICD-10-CM | POA: Diagnosis not present

## 2020-02-29 DIAGNOSIS — D839 Common variable immunodeficiency, unspecified: Secondary | ICD-10-CM | POA: Diagnosis not present

## 2020-03-07 ENCOUNTER — Other Ambulatory Visit: Payer: Self-pay

## 2020-03-07 ENCOUNTER — Encounter: Payer: Self-pay | Admitting: Allergy & Immunology

## 2020-03-07 ENCOUNTER — Ambulatory Visit (INDEPENDENT_AMBULATORY_CARE_PROVIDER_SITE_OTHER): Payer: Medicare HMO | Admitting: Allergy & Immunology

## 2020-03-07 VITALS — BP 112/62 | HR 102 | Temp 98.1°F | Resp 14 | Ht 61.25 in | Wt 161.2 lb

## 2020-03-07 DIAGNOSIS — J4551 Severe persistent asthma with (acute) exacerbation: Secondary | ICD-10-CM

## 2020-03-07 DIAGNOSIS — D808 Other immunodeficiencies with predominantly antibody defects: Secondary | ICD-10-CM

## 2020-03-07 DIAGNOSIS — J310 Chronic rhinitis: Secondary | ICD-10-CM

## 2020-03-07 MED ORDER — METHYLPREDNISOLONE ACETATE 80 MG/ML IJ SUSP
80.0000 mg | Freq: Once | INTRAMUSCULAR | Status: AC
Start: 2020-03-07 — End: 2020-03-07
  Administered 2020-03-07: 80 mg via INTRAMUSCULAR

## 2020-03-07 MED ORDER — GUAIFENESIN-CODEINE 100-10 MG/5ML PO SOLN: 10.0000 mL | Freq: Three times a day (TID) | ORAL | 0 refills | Status: DC | PRN

## 2020-03-07 MED ORDER — PREDNISONE 10 MG PO TABS: ORAL_TABLET | ORAL | 0 refills | Status: DC

## 2020-03-07 MED ORDER — AMOXICILLIN-POT CLAVULANATE 875-125 MG PO TABS: 1.0000 | ORAL_TABLET | Freq: Two times a day (BID) | ORAL | 0 refills | Status: DC

## 2020-03-07 MED ORDER — BREZTRI AEROSPHERE 160-9-4.8 MCG/ACT IN AERO: INHALATION_SPRAY | RESPIRATORY_TRACT | 5 refills | Status: DC

## 2020-03-07 NOTE — Progress Notes (Signed)
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 - DepoMedrol 80mg  IM given in clinic. - Start the prednisone dose pack provided. - Add on Pulmicort nebulizer treatments twice daily for 1-2 weeks. - Prescription cough medicine. - Stop the Trelegy. - Spacer and demonstration provided. - Start Breztri two puffs twice daily with the spacer.  - Daily controller medication(s): Breztri two puffs twice daily + Singulair 10mg  daily + Fasenra every 8 weeks   - Rescue medications: ProAir 4 puffs every 4-6 hours as needed or DuoNeb nebulizer one vial every 4-6 hours as needed - Asthma control goals:  * Full participation in all desired activities (may need albuterol before activity) * Albuterol use two time or less a week on average (not counting use with activity) * Cough interfering with sleep two time or less a month * Oral steroids no more than once a year * No hospitalizations    2. Chronic non-allergic rhinitis - Continue with Xyzal (levocetirizine) 5mg  daily. - Continue with Benadryl 25mg  at night.  - Add on Augmentin twice daily for two weeks. - Diflucan refilled as well in case you need that,   3. 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  - Continue with all of the premedications, including hydration.   4. Follow up as scheduled.   Subjective:   Kathy Tran is a 64 y.o. female presenting today for follow up of  Chief Complaint  Patient presents with  . Sinus Problem    Sore throat, cough up green discharge, Mucus in chest can hear it. Worries about lung infection.  Doing IV treatment every month, headache    [name] has a history of the following: Patient Active Problem List   Diagnosis Date Noted  . 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 Hx of colonic polyps 01/07/2018  . Postmenopausal 07/02/2017  . Hypogammaglobulinemia ([place]) 04/29/2017  . Recurrent infections 04/29/2017  . Non-allergic rhinitis 04/29/2017  . Severe persistent asthma without complication 40/98/1191  . Essential hypertension, benign 03/20/2017  . Asthma exacerbation 12/26/2016  . Bronchitis 09/25/2016  . Asthmatic bronchitis 07/18/2016  . DM type 2 causing vascular disease ([place]) 07/18/2016  . GERD (gastroesophageal reflux disease) 07/18/2016  . Mixed hyperlipidemia 07/18/2016  . History of colonic polyps 06/12/2016    History obtained from: chart review and patient.  Kathy Tran is a 64 y.o. female presenting for a sick visit.  She was last seen in September 2021.  At that time, her lung testing looked great.  She was having some difficulty with coughing and wheezing.  I recommended adding on Pulmicort treatments twice daily for 1 to 2 weeks to prevent her from needing prednisone.  We continue with Trelegy 1 puff in the morning and Singulair 10 mg daily.  She was also doing well on Fasenra every 8 weeks.  For her nonallergic rhinitis, we continue with Xyzal as well as Benadryl.  We refilled the Diflucan and added on Augmentin twice daily for sinusitis.  We also continued with her immunoglobulin for hypogammaglobulinemia.  Since last visit, she has mostly done  well.  However, she reports that she developed a sore throat on Friday. She did not have a fever. She has been snoring more and having more difficulty breathing. She has had phlegm production and sinus drainage. Her husband is not sick. She is the only one in her household who is sick.  She has not been around any sick contacts.  She is fully vaccinated to  COVID-19.  She has been using her albuterol nebulizer to treat this. She also has a box of the budesonide nebulizer which we gave her at the last visit. She has been using the DuoNebs.  She does not have any steroids at home and has not needed antibiotics.  She just wanted to get checked out before it got any worse.  Asthma/Respiratory Symptom History: She remains on her Trelegy one puff once daily. She notices that she gets a salty flavor in her mouth when she has thrush. She gets a rash fairly often.  She has nystatin which she uses around 1-2 times per week.  She does not think she has ever been on Symbicort and has definitely not been on Breztri.  She is open to new ideas for this.  She remains up-to-date on her Berna Bue.  ACT score 16, indicating poor asthma control.  Allergic Rhinitis Symptom History: She remains on her Xyzal daily.  She also has been using her nasal saline rinses, which she is not a big fan of.  Otherwise, there have been no changes to her past medical history, surgical history, family history, or social history.    Review of Systems  Constitutional: Negative.  Negative for fever, malaise/fatigue and weight loss.  HENT: Positive for congestion, sinus pain and sore throat. Negative for ear discharge and ear pain.   Eyes: Negative for pain, discharge and redness.  Respiratory: Positive for cough and shortness of breath. Negative for sputum production and wheezing.   Cardiovascular: Negative.  Negative for chest pain and palpitations.  Gastrointestinal: Negative for abdominal pain and heartburn.  Skin: Negative.  Negative for itching and rash.  Neurological: Negative for dizziness and headaches.  Endo/Heme/Allergies: Negative for environmental allergies. Does not bruise/bleed easily.       Objective:   Blood pressure 112/62, pulse (!) 102, temperature 98.1 F (36.7 C), resp. rate 14, height 5' 1.25" (1.556 m), weight 161 lb 3.2 oz (73.1 kg), SpO2 98 %. Body mass  index is 30.21 kg/m.   Physical Exam:  Physical Exam Constitutional:      Appearance: She is well-developed.     Comments: Somewhat hoarse voice.  HENT:     Head: Normocephalic and atraumatic.     Right Ear: Ear canal and external ear normal. Tympanic membrane is bulging.     Left Ear: Ear canal and external ear normal. Tympanic membrane is bulging.     Ears:     Comments: OME bilaterally.    Nose: No nasal deformity, septal deviation, mucosal edema or rhinorrhea.     Right Sinus: No maxillary sinus tenderness or frontal sinus tenderness.     Left Sinus: No maxillary sinus tenderness or frontal sinus tenderness.     Mouth/Throat:     Mouth: Mucous membranes are not pale and not dry.     Pharynx: Uvula midline.  Eyes:     General:        Right eye: No discharge.        Left eye: No discharge.     Conjunctiva/sclera: Conjunctivae normal.  Right eye: Right conjunctiva is not injected. No chemosis.    Left eye: Left conjunctiva is not injected. No chemosis.    Pupils: Pupils are equal, round, and reactive to light.  Cardiovascular:     Rate and Rhythm: Normal rate and regular rhythm.     Heart sounds: Normal heart sounds.  Pulmonary:     Effort: Pulmonary effort is normal. No tachypnea, accessory muscle usage or respiratory distress.     Breath sounds: Normal breath sounds. No wheezing, rhonchi or rales.     Comments: Moving air well in all lung fields.  No increased work of breathing. Chest:     Chest wall: No tenderness.  Lymphadenopathy:     Cervical: No cervical adenopathy.  Skin:    General: Skin is warm.     Capillary Refill: Capillary refill takes less than 2 seconds.     Coloration: Skin is not pale.     Findings: No abrasion, erythema, petechiae or rash. Rash is not papular, urticarial or vesicular.     Comments: No eczematous or urticarial lesions noted.  Neurological:     Mental Status: She is alert.  Psychiatric:        Behavior: Behavior is cooperative.       Diagnostic studies: none        Salvatore Marvel, MD  Allergy and [place] [facility] [place]

## 2020-03-07 NOTE — Patient Instructions (Addendum)
1. 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 - DepoMedrol 80mg  IM given in clinic. - Start the prednisone dose pack provided. - Add on Pulmicort nebulizer treatments twice daily for 1-2 weeks. - Prescription cough medicine. - Stop the Trelegy. - Spacer and demonstration provided. - Start Breztri two puffs twice daily with the spacer.  - Daily controller medication(s): Breztri two puffs twice daily + Singulair 10mg  daily + Fasenra every 8 weeks   - Rescue medications: ProAir 4 puffs every 4-6 hours as needed or DuoNeb nebulizer one vial every 4-6 hours as needed - Asthma control goals:  * Full participation in all desired activities (may need albuterol before activity) * Albuterol use two time or less a week on average (not counting use with activity) * Cough interfering with sleep two time or less a month * Oral steroids no more than once a year * No hospitalizations    2. Chronic non-allergic rhinitis - Continue with Xyzal (levocetirizine) 5mg  daily. - Continue with Benadryl 25mg  at night.  - Add on Augmentin twice daily for two weeks. - Diflucan refilled as well in case you need that,   3. 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  - Continue with all of the premedications, including hydration.   4. Follow up as scheduled.   Please inform us of any Emergency Department visits, hospitalizations, or changes in symptoms. Call us before going to the ED for breathing or allergy symptoms since we might be able to fit you in for a sick visit. Feel free to contact us anytime with any questions, problems, or concerns.  It was a pleasure to see you again today!  Websites that have reliable patient information: 1. American Academy of Asthma, Allergy, and Immunology: www.aaaai.org 2. Food Allergy Research and Education (FARE): foodallergy.org 3. Mothers of Asthmatics: http://www.asthmacommunitynetwork.org 4.  American College of Allergy, Asthma, and Immunology: www.acaai.org   COVID-19 Vaccine Information can be found at: ShippingScam.co.uk For questions related to vaccine distribution or appointments, please email vaccine@[email or web address] or call [phone].     "Like" [place] on Facebook and Instagram for our latest updates!     HAPPY FALL!     Make sure you are registered to vote! If you have moved or changed any of your contact information, you will need to get this updated before voting!  In some cases, you MAY be able to register to vote online: [email or web address]

## 2020-03-08 ENCOUNTER — Ambulatory Visit (HOSPITAL_COMMUNITY)
Admission: RE | Admit: 2020-03-08 | Discharge: 2020-03-08 | Disposition: A | Discharge status: Home / Self Care | Payer: Medicare HMO | Source: Ambulatory Visit | Attending: Internal Medicine | Admitting: Internal Medicine

## 2020-03-08 DIAGNOSIS — Z1231 Encounter for screening mammogram for malignant neoplasm of breast: Secondary | ICD-10-CM | POA: Diagnosis not present

## 2020-03-08 MED ORDER — PREDNISONE 10 MG PO TABS
ORAL_TABLET | ORAL | 0 refills | Status: DC
Start: 2020-03-08 — End: 2020-05-05

## 2020-03-08 MED ORDER — BREZTRI AEROSPHERE 160-9-4.8 MCG/ACT IN AERO: INHALATION_SPRAY | RESPIRATORY_TRACT | 5 refills | Status: DC

## 2020-03-08 MED ORDER — AMOXICILLIN-POT CLAVULANATE 875-125 MG PO TABS: 1.0000 | ORAL_TABLET | Freq: Two times a day (BID) | ORAL | 0 refills | Status: AC

## 2020-03-08 NOTE — Progress Notes (Signed)
Re-sent prescriptions to her local pharmacy.  Salvatore Marvel, MD Allergy and [place] [facility] [place]

## 2020-03-08 NOTE — Addendum Note (Signed)
Addended by: [name] on: 03/08/2020 09:04 AM   Modules accepted: Orders

## 2020-03-09 ENCOUNTER — Other Ambulatory Visit (HOSPITAL_COMMUNITY): Payer: Self-pay | Admitting: Internal Medicine

## 2020-03-09 DIAGNOSIS — R928 Other abnormal and inconclusive findings on diagnostic imaging of breast: Secondary | ICD-10-CM

## 2020-03-13 ENCOUNTER — Other Ambulatory Visit: Payer: Self-pay | Admitting: Allergy & Immunology

## 2020-03-13 NOTE — Telephone Encounter (Signed)
Is it ok for the patient to have a refill of this medication?

## 2020-03-13 NOTE — Telephone Encounter (Signed)
Patient was seen 03/07/20. She was prescribed Virtussin sugar free and alcohol free. She is requesting 1 more refill for this. Kathy Tran.

## 2020-03-14 MED ORDER — GUAIFENESIN-CODEINE 100-10 MG/5ML PO SOLN: 10.0000 mL | Freq: Three times a day (TID) | ORAL | 0 refills | Status: DC | PRN

## 2020-03-14 NOTE — Addendum Note (Signed)
Addended by: [name] on: 03/14/2020 01:17 PM   Modules accepted: Orders

## 2020-03-14 NOTE — Telephone Encounter (Signed)
Prescription written.  I will bring in to [facility] tomorrow.  Patient informed.  Salvatore Marvel, MD Allergy and [place] [facility] [place]

## 2020-03-16 DIAGNOSIS — J45902 Unspecified asthma with status asthmaticus: Secondary | ICD-10-CM | POA: Diagnosis not present

## 2020-03-16 DIAGNOSIS — I10 Essential (primary) hypertension: Secondary | ICD-10-CM | POA: Diagnosis not present

## 2020-03-16 DIAGNOSIS — E1159 Type 2 diabetes mellitus with other circulatory complications: Secondary | ICD-10-CM | POA: Diagnosis not present

## 2020-03-16 DIAGNOSIS — Z79899 Other long term (current) drug therapy: Secondary | ICD-10-CM | POA: Diagnosis not present

## 2020-03-17 ENCOUNTER — Ambulatory Visit (HOSPITAL_COMMUNITY)
Admission: RE | Admit: 2020-03-17 | Discharge: 2020-03-17 | Disposition: A | Discharge status: Home / Self Care | Payer: Medicare HMO | Source: Ambulatory Visit | Attending: Internal Medicine | Admitting: Internal Medicine

## 2020-03-17 ENCOUNTER — Encounter (HOSPITAL_COMMUNITY): Payer: Self-pay

## 2020-03-17 ENCOUNTER — Other Ambulatory Visit: Payer: Self-pay

## 2020-03-17 DIAGNOSIS — R928 Other abnormal and inconclusive findings on diagnostic imaging of breast: Secondary | ICD-10-CM

## 2020-03-23 DIAGNOSIS — Z6829 Body mass index (BMI) 29.0-29.9, adult: Secondary | ICD-10-CM | POA: Diagnosis not present

## 2020-03-23 DIAGNOSIS — Z23 Encounter for immunization: Secondary | ICD-10-CM | POA: Diagnosis not present

## 2020-03-23 DIAGNOSIS — Z0001 Encounter for general adult medical examination with abnormal findings: Secondary | ICD-10-CM | POA: Diagnosis not present

## 2020-03-23 DIAGNOSIS — I10 Essential (primary) hypertension: Secondary | ICD-10-CM | POA: Diagnosis not present

## 2020-03-23 DIAGNOSIS — R002 Palpitations: Secondary | ICD-10-CM | POA: Diagnosis not present

## 2020-03-23 DIAGNOSIS — E1100 Type 2 diabetes mellitus with hyperosmolarity without nonketotic hyperglycemic-hyperosmolar coma (NKHHC): Secondary | ICD-10-CM | POA: Diagnosis not present

## 2020-03-23 DIAGNOSIS — E785 Hyperlipidemia, unspecified: Secondary | ICD-10-CM | POA: Diagnosis not present

## 2020-03-23 DIAGNOSIS — J45909 Unspecified asthma, uncomplicated: Secondary | ICD-10-CM | POA: Diagnosis not present

## 2020-03-28 ENCOUNTER — Telehealth: Payer: Self-pay | Admitting: *Deleted

## 2020-03-28 NOTE — Telephone Encounter (Signed)
Received fax approval for [employer] prescription savings program. Approval has been labeled and placed in bulk scanning.

## 2020-03-28 NOTE — Telephone Encounter (Signed)
Can someone call Ms. Kathy Tran to let her know that her labs all looked normal? The only abnormal finding was an elevated hemoglobin A1c of 8.0. We will make sure that her PCP gets a copy of these labs.   Salvatore Marvel, MD Allergy and [place] [facility] [place]

## 2020-03-29 DIAGNOSIS — D839 Common variable immunodeficiency, unspecified: Secondary | ICD-10-CM | POA: Diagnosis not present

## 2020-03-29 NOTE — Telephone Encounter (Signed)
Patient has already seen her PCP a week ago and they believe her A1C was elevated due to a depo medrol she received. They are managing her care and will follow up with her elevated A1C

## 2020-03-30 ENCOUNTER — Encounter (HOSPITAL_COMMUNITY): Payer: Medicare HMO

## 2020-03-30 ENCOUNTER — Other Ambulatory Visit (HOSPITAL_COMMUNITY): Payer: Medicare HMO

## 2020-04-14 ENCOUNTER — Encounter: Payer: Self-pay | Admitting: Allergy & Immunology

## 2020-04-14 ENCOUNTER — Ambulatory Visit: Payer: Medicare HMO | Admitting: Allergy & Immunology

## 2020-04-14 ENCOUNTER — Ambulatory Visit (INDEPENDENT_AMBULATORY_CARE_PROVIDER_SITE_OTHER): Payer: Medicare HMO

## 2020-04-14 ENCOUNTER — Other Ambulatory Visit: Payer: Self-pay

## 2020-04-14 VITALS — BP 134/76 | HR 83 | Resp 18 | Ht 62.0 in

## 2020-04-14 DIAGNOSIS — J310 Chronic rhinitis: Secondary | ICD-10-CM

## 2020-04-14 DIAGNOSIS — J4551 Severe persistent asthma with (acute) exacerbation: Secondary | ICD-10-CM | POA: Diagnosis not present

## 2020-04-14 DIAGNOSIS — D808 Other immunodeficiencies with predominantly antibody defects: Secondary | ICD-10-CM

## 2020-04-14 NOTE — Progress Notes (Signed)
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  - Use the Pulmicort + albuterol when you feel like you need it.  - Daily controller medication(s): Breztri two puffs twice daily + Singulair 10mg  daily + Fasenra every 8 weeks   - Rescue medications: ProAir 4 puffs every 4-6 hours as needed or DuoNeb nebulizer one vial every 4-6 hours as needed - Asthma control goals:  * Full participation in all desired activities (may need albuterol before activity) * Albuterol use two time or less a week on average (not counting use with activity) * Cough interfering with sleep two time or less a month * Oral steroids no more than once a year * No hospitalizations    2. Chronic non-allergic rhinitis - Continue with Benadryl 25mg  at night.   3. 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  - Continue with all of the premedications, including hydration.   4. Follow up in four months.   Subjective:   Kathy Tran is a 64 y.o. female presenting today for follow up of  Chief Complaint  Patient presents with  . Asthma    Kathy Tran has a history of the following: Patient Active Problem List   Diagnosis Date Noted  . 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 Hx of colonic polyps 01/07/2018  . Postmenopausal 07/02/2017  . Hypogammaglobulinemia ([place]) 04/29/2017  . Recurrent infections 04/29/2017  . Non-allergic rhinitis 04/29/2017  . Severe persistent asthma without complication  28/31/5176  . Essential hypertension, benign 03/20/2017  . Asthma exacerbation 12/26/2016  . Bronchitis 09/25/2016  . Asthmatic bronchitis 07/18/2016  . DM type 2 causing vascular disease ([place]) 07/18/2016  . GERD (gastroesophageal reflux disease) 07/18/2016  . Mixed hyperlipidemia 07/18/2016  . History of colonic polyps 06/12/2016    History obtained from: chart review and patient.  Kathy Tran is a 64 y.o. female presenting for a follow up visit.  She was last seen in October 2021.  At that time, she was not feeling well from a respiratory standpoint.  We started her on Breztri in lieu of her Trelegy.  She was having a lot of thrush with the Trelegy.  We also gave her a Depo-Medrol injection and started her on a prednisone Dosepak.  We added on Pulmicort nebulizer treatments twice daily for 1 to 2 weeks in conjunction with her albuterol nebulizer.  We did add on Augmentin twice daily for 2 weeks and gave her Diflucan.  We continued with her Benadryl 25 mg at night.  Her immunoglobulin infusions were going well.  Since the last visit, she has done well. She did get the DepoMedrol and she felt better with that and the antibiotics.  Asthma/Respiratory Symptom History: She is on the Breztri two puffs twice daily with a spacer. She was getting thrush with the Trelegy and she now gets it much less often. She is on the [place] every 8 weeks. She is using the breathing treatments with the steroids. She is now using the albuterol only, but she is thinking of adding on  the Pulmicort back into her nebulizer. She has been on DuoNeb daily for the past couple of weeks. She changed to twice daily earlier this week. she is feeling like shei s having chest heaviness since the weather changed.   Allergic Rhinitis Symptom History: She remains on the Benadryl once daily.  The other antihistamines do not seem to do much for her.  She does have Flonase that she does use occasionally.  She reports some nosebleeds occasionally  with this.  She has not needed any antibiotics since we gave her some in October.  Infection Symptom History: Immunoglobulin infusions are going well.  She is having no issues with them at all.  Prior to October, she can remember the last time that she needed antibiotics.  Otherwise, there have been no changes to her past medical history, surgical history, family history, or social history.    Review of Systems  Constitutional: Negative.  Negative for fever, malaise/fatigue and weight loss.  HENT: Negative.  Negative for congestion, ear discharge and ear pain.   Eyes: Negative for pain, discharge and redness.  Respiratory: Negative for cough, sputum production, shortness of breath and wheezing.        Positive for some intermittent chest tightness.  Cardiovascular: Negative.  Negative for chest pain and palpitations.  Gastrointestinal: Negative for abdominal pain, heartburn, nausea and vomiting.  Skin: Negative.  Negative for itching and rash.  Neurological: Negative for dizziness and headaches.  Endo/Heme/Allergies: Negative for environmental allergies. Does not bruise/bleed easily.       Objective:   Blood pressure 134/76, pulse 83, resp. rate 18, height 5\' 2"  (1.575 m), SpO2 95 %. Body mass index is 29.48 kg/m.   Physical Exam:  Physical Exam Constitutional:      Appearance: She is well-developed.     Comments: Well-appearing.  Pleasant.  HENT:     Head: Normocephalic and atraumatic.     Right Ear: Tympanic membrane, ear canal and external ear normal.     Left Ear: Tympanic membrane, ear canal and external ear normal.     Nose: No nasal deformity, septal deviation, mucosal edema or rhinorrhea.     Right Turbinates: Enlarged and swollen.     Left Turbinates: Enlarged and swollen.     Right Sinus: No maxillary sinus tenderness or frontal sinus tenderness.     Left Sinus: No maxillary sinus tenderness or frontal sinus tenderness.     Comments: No polyps. Some crusting.      Mouth/Throat:     Mouth: Mucous membranes are not pale and not dry.     Pharynx: Uvula midline.  Eyes:     General:        Right eye: No discharge.        Left eye: No discharge.     Conjunctiva/sclera: Conjunctivae normal.     Right eye: Right conjunctiva is not injected. No chemosis.    Left eye: Left conjunctiva is not injected. No chemosis.    Pupils: Pupils are equal, round, and reactive to light.  Cardiovascular:     Rate and Rhythm: Normal rate and regular rhythm.     Heart sounds: Normal heart sounds.  Pulmonary:     Effort: Pulmonary effort is normal. No tachypnea, accessory muscle usage or respiratory distress.     Breath sounds: Normal breath sounds. No wheezing, rhonchi or rales.     Comments: Moving air well in all lung fields. No increased work of breathing noted.  Chest:  Chest wall: No tenderness.  Lymphadenopathy:     Cervical: No cervical adenopathy.  Skin:    Coloration: Skin is not pale.     Findings: No abrasion, erythema, petechiae or rash. Rash is not papular, urticarial or vesicular.  Neurological:     Mental Status: She is alert.  Psychiatric:        Behavior: Behavior is cooperative.      Diagnostic studies:    Spirometry: results normal (FEV1: 1.87/82%, FVC: 2.38/82%, FEV1/FVC: 79%).    Spirometry consistent with normal pattern.   Allergy Studies: none       Salvatore Marvel, MD  [facility]

## 2020-04-14 NOTE — Patient Instructions (Addendum)
1. 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  - Use the Pulmicort + albuterol when you feel like you need it.  - Daily controller medication(s): Breztri two puffs twice daily + Singulair 10mg  daily + Fasenra every 8 weeks   - Rescue medications: ProAir 4 puffs every 4-6 hours as needed or DuoNeb nebulizer one vial every 4-6 hours as needed - Asthma control goals:  * Full participation in all desired activities (may need albuterol before activity) * Albuterol use two time or less a week on average (not counting use with activity) * Cough interfering with sleep two time or less a month * Oral steroids no more than once a year * No hospitalizations    2. Chronic non-allergic rhinitis - Continue with Benadryl 25mg  at night.   3. 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  - Continue with all of the premedications, including hydration.   4. Follow up in four months.  Please inform us of any Emergency Department visits, hospitalizations, or changes in symptoms. Call us before going to the ED for breathing or allergy symptoms since we might be able to fit you in for a sick visit. Feel free to contact us anytime with any questions, problems, or concerns.  It was a pleasure to see you again today!  Websites that have reliable patient information: 1. American Academy of Asthma, Allergy, and Immunology: www.aaaai.org 2. Food Allergy Research and Education (FARE): foodallergy.org 3. Mothers of Asthmatics: http://www.asthmacommunitynetwork.org 4. American College of Allergy, Asthma, and Immunology: www.acaai.org   COVID-19 Vaccine Information can be found at: ShippingScam.co.uk For questions related to vaccine distribution or appointments, please email vaccine@[email or web address] .com or call [phone].     "Like" [place] on Facebook and Instagram for our latest  updates!     HAPPY FALL!     Make sure you are registered to vote! If you have moved or changed any of your contact information, you will need to get this updated before voting!  In some cases, you MAY be able to register to vote online: [email or web address]

## 2020-04-25 DIAGNOSIS — D839 Common variable immunodeficiency, unspecified: Secondary | ICD-10-CM | POA: Diagnosis not present

## 2020-05-02 DIAGNOSIS — Z0000 Encounter for general adult medical examination without abnormal findings: Secondary | ICD-10-CM | POA: Diagnosis not present

## 2020-05-05 ENCOUNTER — Other Ambulatory Visit: Payer: Self-pay

## 2020-05-05 ENCOUNTER — Ambulatory Visit
Admission: EM | Admit: 2020-05-05 | Discharge: 2020-05-05 | Disposition: A | Discharge status: Home / Self Care | Payer: Medicare HMO | Attending: Family Medicine | Admitting: Family Medicine

## 2020-05-05 ENCOUNTER — Encounter: Payer: Self-pay | Admitting: Emergency Medicine

## 2020-05-05 DIAGNOSIS — J441 Chronic obstructive pulmonary disease with (acute) exacerbation: Secondary | ICD-10-CM

## 2020-05-05 DIAGNOSIS — R5383 Other fatigue: Secondary | ICD-10-CM | POA: Diagnosis not present

## 2020-05-05 DIAGNOSIS — R0602 Shortness of breath: Secondary | ICD-10-CM

## 2020-05-05 DIAGNOSIS — J209 Acute bronchitis, unspecified: Secondary | ICD-10-CM

## 2020-05-05 DIAGNOSIS — R062 Wheezing: Secondary | ICD-10-CM

## 2020-05-05 DIAGNOSIS — R059 Cough, unspecified: Secondary | ICD-10-CM | POA: Diagnosis not present

## 2020-05-05 DIAGNOSIS — R6889 Other general symptoms and signs: Secondary | ICD-10-CM | POA: Diagnosis not present

## 2020-05-05 MED ORDER — AMOXICILLIN-POT CLAVULANATE 875-125 MG PO TABS: 1.0000 | ORAL_TABLET | Freq: Two times a day (BID) | ORAL | 0 refills | Status: AC

## 2020-05-05 MED ORDER — PREDNISONE 10 MG (21) PO TBPK: ORAL_TABLET | Freq: Every day | ORAL | 0 refills | Status: AC

## 2020-05-05 MED ORDER — METHYLPREDNISOLONE SODIUM SUCC 125 MG IJ SOLR
125.0000 mg | Freq: Once | INTRAMUSCULAR | Status: AC
  Administered 2020-05-05: 125 mg via INTRAMUSCULAR

## 2020-05-05 NOTE — Discharge Instructions (Addendum)
I have sent in Augmentin for you to take twice a day for 10 days.  I have sent in a prednisone taper for you to take for 6 days. 6 tablets on day one, 5 tablets on day two, 4 tablets on day three, 3 tablets on day four, 2 tablets on day five, and 1 tablet on day six.  I have given you a steroid injection in the office today as well.  Your COVID and Flu tests are pending.  You should self quarantine until the test results are back.    Take Tylenol or ibuprofen as needed for fever or discomfort.  Rest and keep yourself hydrated.    Follow-up with your primary care provider if your symptoms are not improving.

## 2020-05-05 NOTE — ED Provider Notes (Signed)
[facility]   [id number] 05/05/20 Arrival Time: 8315   CC: COVID symptoms  SUBJECTIVE: History from: patient.  Kathy Tran is a 64 y.o. female who presents with abrupt onset of nasal congestion, PND, and productive cough with dark green sputum for the last week. Has hx COPD. Takes immunoglobulin infusions for this monthly. Denies sick exposure to COVID, flu or strep. Denies recent travel. Has negative history of Covid. Has completed Covid vaccines. Has not completed booster. Has not taken OTC medications for this. SOB and cough are worse with activity. Reports previous symptoms in the past. Denies fever, chills, sinus pain, rhinorrhea, sore throat, nausea, changes in bowel or bladder habits.    ROS: As per HPI.  All other pertinent ROS negative.     Past Medical History:  Diagnosis Date  . Angio-edema   . Arthritis   . Asthma    Flu march and May 2018  . COPD (chronic obstructive pulmonary disease) ([place])   . Diabetes mellitus type 2, controlled ([place])    x5 yrs  . Eczema   . GERD (gastroesophageal reflux disease)   . Heart murmur   . History of kidney stones   . Hypertension    x 5 yrs  . Hypogammaglobulinemia ([place])   . Recurrent upper respiratory infection (URI)   . Renal disorder    renal stones  . Urticaria    Past Surgical History:  Procedure Laterality Date  . ADENOIDECTOMY    . BIOPSY  04/01/2018   Procedure: BIOPSY;  Surgeon: Rogene Houston, MD;  [facility];  Service: Endoscopy;;  . Mortimer Fries SUSPENSION PLASTY Right 04/13/2019   Procedure: CARPOMETACARPEL [facility]) SUSPENSION PLASTY RIGHT THUMB;  Surgeon: Daryll Brod, MD;  Location: [facility];  Service: Orthopedics;  Laterality: Right;  AXILLARY BLOCK  . CATARACT EXTRACTION W/PHACO Left 01/19/2018   Procedure: CATARACT EXTRACTION PHACO AND INTRAOCULAR LENS PLACEMENT LEFT EYE;  Surgeon: Tonny Branch, MD;  Location: [facility];  Service: Ophthalmology;  Laterality: Left;  CDE: 7.52   . CATARACT EXTRACTION W/PHACO Right 02/23/2018   Procedure: CATARACT EXTRACTION PHACO AND INTRAOCULAR LENS PLACEMENT ([place]);  Surgeon: Tonny Branch, MD;  Location: [facility];  Service: Ophthalmology;  Laterality: Right;  CDE: 5.47  . [place]  . COLONOSCOPY  03/27/2011   Procedure: COLONOSCOPY;  Surgeon: Rogene Houston, MD;  [facility];  Service: Endoscopy;  Laterality: N/A;  2:00  . COLONOSCOPY N/A 04/01/2018   Procedure: COLONOSCOPY;  Surgeon: Rogene Houston, MD;  [facility];  Service: Endoscopy;  Laterality: N/A;  1030  . CYSTOSCOPY W/ URETERAL STENT PLACEMENT Left 11/08/2015   Procedure: CYSTOSCOPY, URETEROSCOPY, WITH STONE BASKET EXTRACTION;  Surgeon: Raynelle Bring, MD;  Location[facility];  Service: Urology;  Laterality: Left;  . CYSTOSCOPY/RETROGRADE/URETEROSCOPY/STONE EXTRACTION WITH BASKET Right 07/03/2015   Procedure: CYSTOSCOPY/RETROGRADE/URETEROSCOPY/STONE EXTRACTION WITH BASKET;  Surgeon: Irine Seal, MD;  Location[facility];  Service: Urology;  Laterality: Right;  . ENDOMETRIAL ABLATION    . FLEXIBLE BRONCHOSCOPY Bilateral 10/31/2016   Procedure: FLEXIBLE BRONCHOSCOPY WITH PROPOFOL;  Surgeon: Sinda Du, MD;  [facility];  Service: Cardiopulmonary;  Laterality: Bilateral;  . FLEXIBLE BRONCHOSCOPY N/A 01/17/2017   Procedure: FLEXIBLE BRONCHOSCOPY WITH PROPOFOL;  Surgeon: Sinda Du, MD;  [facility];  Service: Cardiopulmonary;  Laterality: N/A;  . KIDNEY STONE SURGERY     multiple  . SHOULDER SURGERY    . SINOSCOPY    . TONSILLECTOMY    .  TUBAL LIGATION  1991  . TYMPANOSTOMY TUBE PLACEMENT     Allergies  Allergen Reactions  . Sulfa Antibiotics Anaphylaxis   Current Facility-Administered Medications on File Prior to Encounter  Medication Dose Route Frequency Provider Last Rate Last Admin  . Benralizumab SOSY 30 mg  30 mg Subcutaneous Q28 days Dara Hoyer, FNP   30 mg at 04/14/20 1041  . Mepolizumab SOLR  100 mg  100 mg Subcutaneous Q28 days [name], MD   100 mg at 07/28/19 1105   Current Outpatient Medications on File Prior to Encounter  Medication Sig Dispense Refill  . Alcohol Swabs (B-D SINGLE USE SWABS REGULAR) PADS     . amLODipine (NORVASC) 5 MG tablet TAKE 1 TABLET BY MOUTH AT BEDTIME    . aspirin EC 81 MG tablet Take 81 mg by mouth daily. AT NIGHT    . betamethasone dipropionate 0.05 % cream     . Blood Glucose Calibration (TRUE METRIX LEVEL 1) Low SOLN     . Blood Glucose Monitoring Suppl (TRUE METRIX AIR GLUCOSE METER) w/Device KIT     . Budeson-Glycopyrrol-Formoterol (BREZTRI AEROSPHERE) 160-9-4.8 MCG/ACT AERO Inhale 2 puffs into the lungs twice daily with spacer. Rinse, gargle and spit after use. 10.7 g 5  . budesonide (PULMICORT) 0.5 MG/2ML nebulizer solution Add twice daily for 1-2 weeks during respiratory flares. 120 mL 5  . carvedilol (COREG) 25 MG tablet Take 25 mg by mouth 2 (two) times daily with a meal.    . diphenhydrAMINE (BENADRYL) 25 MG tablet Take 25 mg by mouth at bedtime.    . DROPLET PEN NEEDLES 32G X 4 MM MISC     . famotidine (PEPCID) 20 MG tablet Take 20 mg by mouth at bedtime.    . fluconazole (DIFLUCAN) 100 MG tablet Take 1 tablet by mouth daily for 7 days as needed. 7 tablet 5  . furosemide (LASIX) 40 MG tablet Take 40 mg by mouth.    Marland Kitchen guaiFENesin (MUCINEX) 600 MG 12 hr tablet Take 1 tablet (600 mg total) by mouth 2 (two) times daily. 60 tablet 1  . guaiFENesin-codeine 100-10 MG/5ML syrup Take 10 mLs by mouth 3 (three) times daily as needed for cough. 473 mL 0  . Insulin Glargine (LANTUS [place]) Inject 40 Units into the skin at bedtime.    Marland Kitchen ipratropium-albuterol (DUONEB) 0.5-2.5 (3) MG/3ML SOLN Take 3 mLs by nebulization 3 (three) times daily. (Patient taking differently: Take 3 mLs by nebulization as needed. ) 360 mL 5  . LORazepam (ATIVAN) 0.5 MG tablet Take 0.5 mg by mouth at bedtime as needed for sleep.     . meloxicam (MOBIC) 15 MG tablet Take  15 mg by mouth. Tuesday, Thursday and Saturday    . metFORMIN (GLUCOPHAGE-XR) 500 MG 24 hr tablet Take 1,000 mg by mouth 2 (two) times daily.    . montelukast (SINGULAIR) 10 MG tablet TAKE (1) TABLET BY MOUTH AT BEDTIME. 30 tablet 5  . nystatin (MYCOSTATIN) 100000 UNIT/ML suspension Take 5 mLs (500,000 Units total) by mouth in the morning and at bedtime. Swish and swallow 120 mL 2  . pantoprazole (PROTONIX) 40 MG tablet Take 1 tablet (40 mg total) by mouth 2 (two) times daily before a meal. (Patient taking differently: Take 40 mg by mouth daily. ) 30 tablet 2  . pravastatin (PRAVACHOL) 10 MG tablet Take 10 mg by mouth at bedtime.     . TRUE METRIX BLOOD GLUCOSE TEST test strip     .  TRUEplus Lancets 33G MISC     . valsartan (DIOVAN) 320 MG tablet     . VENTOLIN HFA 108 (90 Base) MCG/ACT inhaler Inhale 2 puffs into the lungs every 4 (four) hours as needed for wheezing or shortness of breath.     Marland Kitchen VICTOZA 18 MG/3ML SOPN Inject 1.2 mg into the skin daily at 8 pm. BETWEEN 1900 & 2100     Social History   Socioeconomic History  . Marital status: Married    Spouse name: Not on file  . Number of children: Not on file  . Years of education: Not on file  . Highest education level: Not on file  Occupational History  . Not on file  Tobacco Use  . Smoking status: Never Smoker  . Smokeless tobacco: Never Used  Vaping Use  . Vaping Use: Never used  Substance and Sexual Activity  . Alcohol use: Not Currently  . Drug use: No  . Sexual activity: Yes    Birth control/protection: Surgical, Post-menopausal    Comment: tubal and ablation  Other Topics Concern  . Not on file  Social History Narrative  . Not on file   Social Determinants of Health   Financial Resource Strain: Not on file  Food Insecurity: Not on file  Transportation Needs: Not on file  Physical Activity: Not on file  Stress: Not on file  Social Connections: Not on file  Intimate Partner Violence: Not on file   Family  History  Problem Relation Age of Onset  . COPD Mother   . Cancer Father     OBJECTIVE:  Vitals:   05/05/20 0814  BP: 137/86  Pulse: (!) 109  Resp: 19  Temp: 97.9 F (36.6 C)  TempSrc: Oral  SpO2: 96%     General appearance: alert; appears fatigued, but nontoxic; speaking in full sentences and tolerating own secretions HEENT: NCAT; Ears: EACs clear, TMs pearly gray; Eyes: PERRL.  EOM grossly intact. Sinuses: nontender; Nose: nares patent without rhinorrhea, Throat: oropharynx clear, tonsils non erythematous or enlarged, uvula midline  Neck: supple with LAD Lungs: unlabored respirations, symmetrical air entry; cough: moderate; no respiratory distress; wheezing to bilateral lower lobes Heart: regular rate and rhythm.  Radial pulses 2+ symmetrical bilaterally Skin: warm and dry Psychological: alert and cooperative; normal mood and affect  LABS:  No results found for this or any previous visit (from the past 24 hour(s)).   ASSESSMENT & PLAN:  1. COPD exacerbation ([place])   2. Cough   3. Wheezing   4. Other fatigue   5. SOB (shortness of breath)     Meds ordered this encounter  Medications  . methylPREDNISolone sodium succinate (SOLU-MEDROL) 125 mg/2 mL injection 125 mg  . amoxicillin-clavulanate (AUGMENTIN) 875-125 MG tablet    Sig: Take 1 tablet by mouth 2 (two) times daily for 10 days.    Dispense:  20 tablet    Refill:  0    Order Specific Question:   Supervising Provider    Answer:   Chase Picket [id number]  . predniSONE (STERAPRED UNI-PAK 21 TAB) 10 MG (21) TBPK tablet    Sig: Take by mouth daily for 6 days. Take 6 tablets on day 1, 5 tablets on day 2, 4 tablets on day 3, 3 tablets on day 4, 2 tablets on day 5, 1 tablet on day 6    Dispense:  21 tablet    Refill:  0    Order Specific Question:   Supervising Provider  Answer:   Chase Picket [[id number]]   Solumedrol 180m IM in office today Prescribed steroid taper Will go ahead and treat as COPD  exacerbation Prescribed Augmentin Prescribed fluconazole   COVID and flu testing ordered.  It will take between 1-2 days for test results.  Someone will contact you regarding abnormal results.    Patient should remain in quarantine until they have received Covid results.  If negative you may resume normal activities (go back to work/school) while practicing hand hygiene, social distance, and mask wearing.  If positive, patient should remain in quarantine for 10 days from symptom onset AND greater than 72 hours after symptoms resolution (absence of fever without the use of fever-reducing medication and improvement in respiratory symptoms), whichever is longer Get plenty of rest and push fluids Continue home medication regimen Use OTC medications like ibuprofen or tylenol as needed fever or pain Call or go to the ED if you have any new or worsening symptoms such as fever, worsening cough, shortness of breath, chest tightness, chest pain, turning blue, changes in mental status.  Reviewed expectations re: course of current medical issues. Questions answered. Outlined signs and symptoms indicating need for more acute intervention. Patient verbalized understanding. After Visit Summary given.         MFaustino Congress NP 05/05/20 [phone]

## 2020-05-05 NOTE — ED Triage Notes (Signed)
Chest tightness, wheezing, coughing up green mucous, ear pain and headache.  Pt states she has copd and this is what happens when she has a flare.

## 2020-05-09 LAB — COVID-19, FLU A+B NAA
Influenza A, NAA: NOT DETECTED
Influenza B, NAA: NOT DETECTED
SARS-CoV-2, NAA: DETECTED — AB

## 2020-05-11 ENCOUNTER — Telehealth: Payer: Self-pay | Admitting: Allergy & Immunology

## 2020-05-11 NOTE — Telephone Encounter (Signed)
Pt informed and will call right now to see about antibodies

## 2020-05-11 NOTE — Telephone Encounter (Signed)
Patient called and wanted to let you know that she has covid and wanted to know if you have any suggest for her to do [phone].

## 2020-05-11 NOTE — Telephone Encounter (Signed)
I would recommend that she diligently use her pulse ox to make sure that she is keeping her oxygen status greater than 92%. I would also recommend that she call about monoclonal antibodies and that she emphasize on the line that she has an immunodeficiency (see below):  Recommendations for at home COVID-19 symptoms management:  1. Please continue isolation at home. 2. Call [phone] to see whether you might be eligible for therapeutic antibody infusions (leave your name and they will call you back).  3. If have acute worsening of symptoms please go to ER/urgent care for further evaluation. 4. Check pulse oximetry and if below 90-92% please go to ER. 5. The following supplements MAY help:  ? Vitamin C 500mg  twice a day and Quercetin 250-500 mg twice a day ? Vitamin D3 2000 - 4000 u/day ? B Complex vitamins ? Zinc 75-100 mg/day ? Melatonin 6-10 mg at night (the optimal dose is unknown) ? Aspirin 81mg /day (if no history of bleeding issues)  8-10, MD [facility]

## 2020-05-11 NOTE — Telephone Encounter (Signed)
Please advise to recommendations to help pt while she has covid

## 2020-05-13 ENCOUNTER — Encounter (HOSPITAL_COMMUNITY): Payer: Self-pay | Admitting: Emergency Medicine

## 2020-05-13 ENCOUNTER — Emergency Department (HOSPITAL_COMMUNITY): Payer: Medicare HMO

## 2020-05-13 ENCOUNTER — Other Ambulatory Visit: Payer: Self-pay

## 2020-05-13 ENCOUNTER — Emergency Department (HOSPITAL_COMMUNITY)
Admission: EM | Admit: 2020-05-13 | Discharge: 2020-05-13 | Disposition: A | Discharge status: Home / Self Care | Payer: Medicare HMO | Attending: Emergency Medicine | Admitting: Emergency Medicine

## 2020-05-13 DIAGNOSIS — E860 Dehydration: Secondary | ICD-10-CM

## 2020-05-13 DIAGNOSIS — Z794 Long term (current) use of insulin: Secondary | ICD-10-CM | POA: Insufficient documentation

## 2020-05-13 DIAGNOSIS — J189 Pneumonia, unspecified organism: Secondary | ICD-10-CM | POA: Diagnosis not present

## 2020-05-13 DIAGNOSIS — I10 Essential (primary) hypertension: Secondary | ICD-10-CM | POA: Insufficient documentation

## 2020-05-13 DIAGNOSIS — U071 COVID-19: Secondary | ICD-10-CM | POA: Insufficient documentation

## 2020-05-13 DIAGNOSIS — E119 Type 2 diabetes mellitus without complications: Secondary | ICD-10-CM | POA: Diagnosis not present

## 2020-05-13 DIAGNOSIS — R52 Pain, unspecified: Secondary | ICD-10-CM

## 2020-05-13 DIAGNOSIS — Z7982 Long term (current) use of aspirin: Secondary | ICD-10-CM | POA: Insufficient documentation

## 2020-05-13 DIAGNOSIS — J449 Chronic obstructive pulmonary disease, unspecified: Secondary | ICD-10-CM | POA: Insufficient documentation

## 2020-05-13 DIAGNOSIS — M791 Myalgia, unspecified site: Secondary | ICD-10-CM | POA: Diagnosis not present

## 2020-05-13 DIAGNOSIS — Z7984 Long term (current) use of oral hypoglycemic drugs: Secondary | ICD-10-CM | POA: Diagnosis not present

## 2020-05-13 DIAGNOSIS — Z79899 Other long term (current) drug therapy: Secondary | ICD-10-CM | POA: Insufficient documentation

## 2020-05-13 DIAGNOSIS — J45909 Unspecified asthma, uncomplicated: Secondary | ICD-10-CM | POA: Insufficient documentation

## 2020-05-13 DIAGNOSIS — R059 Cough, unspecified: Secondary | ICD-10-CM | POA: Diagnosis present

## 2020-05-13 LAB — CBC WITH DIFFERENTIAL/PLATELET
Abs Immature Granulocytes: 0.03 10*3/uL (ref 0.00–0.07)
Basophils Absolute: 0 10*3/uL (ref 0.0–0.1)
Basophils Relative: 0 %
Eosinophils Absolute: 0 10*3/uL (ref 0.0–0.5)
Eosinophils Relative: 0 %
HCT: 36 % (ref 36.0–46.0)
Hemoglobin: 11.7 g/dL — ABNORMAL LOW (ref 12.0–15.0)
Immature Granulocytes: 1 %
Lymphocytes Relative: 24 %
Lymphs Abs: 0.9 10*3/uL (ref 0.7–4.0)
MCH: 31.8 pg (ref 26.0–34.0)
MCHC: 32.5 g/dL (ref 30.0–36.0)
MCV: 97.8 fL (ref 80.0–100.0)
Monocytes Absolute: 0.2 10*3/uL (ref 0.1–1.0)
Monocytes Relative: 6 %
Neutro Abs: 2.7 10*3/uL (ref 1.7–7.7)
Neutrophils Relative %: 69 %
Platelets: 161 10*3/uL (ref 150–400)
RBC: 3.68 MIL/uL — ABNORMAL LOW (ref 3.87–5.11)
RDW: 12.7 % (ref 11.5–15.5)
WBC: 3.9 10*3/uL — ABNORMAL LOW (ref 4.0–10.5)
nRBC: 0 % (ref 0.0–0.2)

## 2020-05-13 LAB — COMPREHENSIVE METABOLIC PANEL
ALT: 21 U/L (ref 0–44)
AST: 21 U/L (ref 15–41)
Albumin: 3.2 g/dL — ABNORMAL LOW (ref 3.5–5.0)
Alkaline Phosphatase: 32 U/L — ABNORMAL LOW (ref 38–126)
Anion gap: 9 (ref 5–15)
BUN: 9 mg/dL (ref 8–23)
CO2: 25 mmol/L (ref 22–32)
Calcium: 8.3 mg/dL — ABNORMAL LOW (ref 8.9–10.3)
Chloride: 101 mmol/L (ref 98–111)
Creatinine, Ser: 0.59 mg/dL (ref 0.44–1.00)
GFR, Estimated: >60 mL/min (ref >60)
Glucose, Bld: 166 mg/dL — ABNORMAL HIGH (ref 70–99)
Potassium: 3.5 mmol/L (ref 3.5–5.1)
Sodium: 135 mmol/L (ref 135–145)
Total Bilirubin: 0.6 mg/dL (ref 0.3–1.2)
Total Protein: 6.3 g/dL — ABNORMAL LOW (ref 6.5–8.1)

## 2020-05-13 MED ORDER — ACETAMINOPHEN 500 MG PO TABS
1000.0000 mg | ORAL_TABLET | Freq: Once | ORAL | Status: AC
  Administered 2020-05-13: 1000 mg via ORAL
  Filled 2020-05-13: qty 2

## 2020-05-13 MED ORDER — SODIUM CHLORIDE 0.9 % IV BOLUS
1000.0000 mL | Freq: Once | INTRAVENOUS | Status: AC
  Administered 2020-05-13: 1000 mL via INTRAVENOUS

## 2020-05-13 NOTE — ED Notes (Signed)
Pt walked around room. Sats maintained at 92-94% on RA

## 2020-05-13 NOTE — ED Triage Notes (Signed)
Pt to the ed with c/o being covid positive on 05/05/20 and states she is not getting better.  Pt wants the monoclonal antibody treatment.

## 2020-05-13 NOTE — ED Notes (Signed)
PA in with pt now

## 2020-05-13 NOTE — Discharge Instructions (Addendum)
Please continue to monitor your symptoms closely.  Follow-up with your primary care doctor or the post Covid care center in [place] listed on your paperwork today.  Your chest x-ray shows some mild signs of Covid pneumonia but lab work is reassuring.  I certainly think the dehydration was contributing to your symptoms.  You can take Tylenol 1000 mg every 6 hours to help with body aches.  Continue using your inhalers to help reduce lung inflammation.  Your oxygen saturations look great today and from a breathing standpoint seem to be doing well.  Return for any new or worsening symptoms.

## 2020-05-13 NOTE — ED Provider Notes (Signed)
[facility] Provider Note   CSN: [id number] Arrival date & time: 05/13/20  1013     History Chief Complaint  Patient presents with  . Covid Positive    Kathy Tran is a 65 y.o. female.  Kathy Tran is a 65 y.o. female with a history of asthma, COPD, diabetes, hypertension, GERD, kidney stones, who presents to the emergency department with COVID-19 infection.  Patient reports that she initially tested positive on 12/24, at this time she has been having cough and congestion for 1 week.  She was treated with A steroid taper and Augmentin, but was then found to be Covid positive.  She reports symptoms have now been ongoing for about 2 weeks and she does not feel like she is getting over this.  Patient is very concerned.  She reports that her breathing overall has been doing okay, and the steroid taper she completed seem to really help, she still has an intermittent cough but reports it is now unproductive.  She is also using an inhaled corticosteroid daily.  She states that she has had persistent body aches and reports that her whole body feels hypersensitive to the touch and these have not been improving.  She feels like she can never get comfortable.  She also reports she has had very poor appetite and feels that she is getting dehydrated, she has not been vomiting, but has had some nausea and has not been wanting to eat or drink much.  Denies abdominal pain.  No chest pain or shortness of breath currently.  Has had fevers intermittently.  She has not been taking anything for the body aches.  Reports she has severe COPD and is concerned she is not improving.  She had 2 Covid vaccine doses, but has not yet had a booster.  Has not received the monoclonal antibody infusion.        Past Medical History:  Diagnosis Date  . Angio-edema   . Arthritis   . Asthma    Flu march and May 2018  . COPD (chronic obstructive pulmonary disease) ([place])   . Diabetes mellitus type 2,  controlled ([place])    x5 yrs  . Eczema   . GERD (gastroesophageal reflux disease)   . Heart murmur   . History of kidney stones   . Hypertension    x 5 yrs  . Hypogammaglobulinemia ([place])   . Recurrent upper respiratory infection (URI)   . Renal disorder    renal stones  . Urticaria     Patient Active Problem List   Diagnosis Date Noted  . 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 Hx of colonic polyps 01/07/2018  . Postmenopausal 07/02/2017  . Hypogammaglobulinemia ([place]) 04/29/2017  . Recurrent infections 04/29/2017  . Non-allergic rhinitis 04/29/2017  . Severe persistent asthma without complication 90/24/0973  . Essential hypertension, benign 03/20/2017  . Asthma exacerbation 12/26/2016  . Bronchitis 09/25/2016  . Asthmatic bronchitis 07/18/2016  . DM type 2 causing vascular disease ([place]) 07/18/2016  . GERD (gastroesophageal reflux disease) 07/18/2016  . Mixed hyperlipidemia 07/18/2016  . History of colonic polyps 06/12/2016    Past Surgical History:  Procedure Laterality Date  . ADENOIDECTOMY    . BIOPSY  04/01/2018   Procedure: BIOPSY;  Surgeon: Rogene Houston, MD;  [facility];  Service: Endoscopy;;  . CARPOMETACARPEL SUSPENSION PLASTY Right 04/13/2019   Procedure: CARPOMETACARPEL ([place]) SUSPENSION PLASTY RIGHT THUMB;  Surgeon: Daryll Brod, MD;  Location: [facility];  Service: Orthopedics;  Laterality: Right;  AXILLARY BLOCK  . CATARACT EXTRACTION W/PHACO Left 01/19/2018   Procedure: CATARACT EXTRACTION PHACO AND INTRAOCULAR LENS PLACEMENT LEFT EYE;  Surgeon: Tonny Branch, MD;  Location: [facility];  Service: Ophthalmology;  Laterality: Left;  CDE: 7.52  . CATARACT EXTRACTION W/PHACO Right 02/23/2018   Procedure: CATARACT EXTRACTION PHACO AND INTRAOCULAR LENS PLACEMENT ([place]);  Surgeon: Tonny Branch, MD;  Location: [facility];  Service: Ophthalmology;  Laterality: Right;  CDE: 5.47  . [place]  . COLONOSCOPY  03/27/2011   Procedure: COLONOSCOPY;  Surgeon: Rogene Houston, MD;  [facility];  Service: Endoscopy;  Laterality: N/A;  2:00  . COLONOSCOPY N/A 04/01/2018   Procedure: COLONOSCOPY;  Surgeon: Rogene Houston, MD;  [facility];  Service: Endoscopy;  Laterality: N/A;  1030  . CYSTOSCOPY W/ URETERAL STENT PLACEMENT Left 11/08/2015   Procedure: CYSTOSCOPY, URETEROSCOPY, WITH STONE BASKET EXTRACTION;  Surgeon: Raynelle Bring, MD;  Location: [facility];  Service: Urology;  Laterality: Left;  . CYSTOSCOPY/RETROGRADE/URETEROSCOPY/STONE EXTRACTION WITH BASKET Right 07/03/2015   Procedure: CYSTOSCOPY/RETROGRADE/URETEROSCOPY/STONE EXTRACTION WITH BASKET;  Surgeon: Irine Seal, MD;  Location[facility];  Service: Urology;  Laterality: Right;  . ENDOMETRIAL ABLATION    . FLEXIBLE BRONCHOSCOPY Bilateral 10/31/2016   Procedure: FLEXIBLE BRONCHOSCOPY WITH PROPOFOL;  Surgeon: Sinda Du, MD;  [facility];  Service: Cardiopulmonary;  Laterality: Bilateral;  . FLEXIBLE BRONCHOSCOPY N/A 01/17/2017   Procedure: FLEXIBLE BRONCHOSCOPY WITH PROPOFOL;  Surgeon: Sinda Du, MD;  [facility];  Service: Cardiopulmonary;  Laterality: N/A;  . KIDNEY STONE SURGERY     multiple  . SHOULDER SURGERY    . SINOSCOPY    . TONSILLECTOMY    . TUBAL LIGATION  1991  . TYMPANOSTOMY TUBE PLACEMENT       OB History    Gravida  2   Para  2   Term  2   Preterm      AB      Living  2     SAB      IAB      Ectopic      Multiple      Live Births  2           Family History  Problem Relation Age of Onset  . COPD Mother   . Cancer Father     Social History   Tobacco Use  . Smoking status: Never Smoker  . Smokeless tobacco: Never Used  Vaping Use  . Vaping Use: Never used  Substance Use Topics  . Alcohol use: Not Currently  . Drug use: No    Home Medications Prior to Admission medications   Medication Sig Start Date  End Date Taking? Authorizing Provider  Alcohol Swabs (B-D SINGLE USE SWABS REGULAR) PADS  08/17/19   [provider]  amLODipine (NORVASC) 5 MG tablet TAKE 1 TABLET BY MOUTH AT BEDTIME 06/20/19   [provider]  amoxicillin-clavulanate (AUGMENTIN) 875-125 MG tablet Take 1 tablet by mouth 2 (two) times daily for 10 days. 05/05/20 05/15/20  Faustino Congress, NP  aspirin EC 81 MG tablet Take 81 mg by mouth daily. AT NIGHT    [provider]  betamethasone dipropionate 0.05 % cream  06/19/19   [provider]  Blood Glucose Calibration (TRUE METRIX LEVEL 1) Low SOLN  08/13/19   [provider]  Blood Glucose Monitoring  Suppl (TRUE METRIX AIR GLUCOSE METER) w/Device KIT  08/18/19   [provider]  Budeson-Glycopyrrol-Formoterol (BREZTRI AEROSPHERE) 160-9-4.8 MCG/ACT AERO Inhale 2 puffs into the lungs twice daily with spacer. Rinse, gargle and spit after use. 03/08/20   [name], MD  budesonide (PULMICORT) 0.5 MG/2ML nebulizer solution Add twice daily for 1-2 weeks during respiratory flares. 01/14/20   [name], MD  carvedilol (COREG) 25 MG tablet Take 25 mg by mouth 2 (two) times daily with a meal.    [provider]  diphenhydrAMINE (BENADRYL) 25 MG tablet Take 25 mg by mouth at bedtime.    [provider]  DROPLET PEN NEEDLES 32G X 4 MM MISC  08/13/19   [provider]  famotidine (PEPCID) 20 MG tablet Take 20 mg by mouth at bedtime.    [provider]  fluconazole (DIFLUCAN) 100 MG tablet Take 1 tablet by mouth daily for 7 days as needed. 02/09/20   [name], MD  furosemide (LASIX) 40 MG tablet Take 40 mg by mouth.    [provider]  guaiFENesin (MUCINEX) 600 MG 12 hr tablet Take 1 tablet (600 mg total) by mouth 2 (two) times daily. 09/30/16   Asencion Noble, MD  guaiFENesin-codeine 100-10 MG/5ML syrup Take 10 mLs by mouth 3 (three) times daily as needed for cough. 03/14/20    [name], MD  Insulin Glargine (LANTUS [place]) Inject 40 Units into the skin at bedtime.    [provider]  ipratropium-albuterol (DUONEB) 0.5-2.5 (3) MG/3ML SOLN Take 3 mLs by nebulization 3 (three) times daily. Patient taking differently: Take 3 mLs by nebulization as needed.  09/30/16   Asencion Noble, MD  LORazepam (ATIVAN) 0.5 MG tablet Take 0.5 mg by mouth at bedtime as needed for sleep.  03/05/17   [provider]  meloxicam (MOBIC) 15 MG tablet Take 15 mg by mouth. Tuesday, Thursday and Saturday    [provider]  metFORMIN (GLUCOPHAGE-XR) 500 MG 24 hr tablet Take 1,000 mg by mouth 2 (two) times daily. 04/17/15   [provider]  montelukast (SINGULAIR) 10 MG tablet TAKE (1) TABLET BY MOUTH AT BEDTIME. 10/06/18   [name], MD  nystatin (MYCOSTATIN) 100000 UNIT/ML suspension Take 5 mLs (500,000 Units total) by mouth in the morning and at bedtime. Swish and swallow 10/08/19   Rigoberto Noel, MD  pantoprazole (PROTONIX) 40 MG tablet Take 1 tablet (40 mg total) by mouth 2 (two) times daily before a meal. Patient taking differently: Take 40 mg by mouth daily.  07/23/16   Asencion Noble, MD  pravastatin (PRAVACHOL) 10 MG tablet Take 10 mg by mouth at bedtime.  06/18/15   [provider]  TRUE METRIX BLOOD GLUCOSE TEST test strip  08/17/19   [provider]  TRUEplus Lancets 33G [place]  08/17/19   [provider]  valsartan (DIOVAN) 320 MG tablet  11/02/19   [provider]  VENTOLIN HFA 108 (90 Base) MCG/ACT inhaler Inhale 2 puffs into the lungs every 4 (four) hours as needed for wheezing or shortness of breath.  11/03/15   [provider]  VICTOZA 18 MG/3ML SOPN Inject 1.2 mg into the skin daily at 8 pm. BETWEEN 1900 & 2100 10/31/15   [provider]    Allergies    Sulfa antibiotics  Review of Systems   Review of Systems  Constitutional: Positive for chills, fatigue and fever.  HENT: Positive for  congestion and rhinorrhea.  Respiratory: Positive for cough and shortness of breath.   Cardiovascular: Negative for chest pain.  Gastrointestinal: Negative for abdominal pain, nausea and vomiting.  Genitourinary: Negative for dysuria and frequency.  Musculoskeletal: Positive for arthralgias and myalgias.  Skin: Negative for color change and rash.  Neurological: Negative for weakness, numbness and headaches.  All other systems reviewed and are negative.   Physical Exam Updated Vital Signs BP 128/74 (BP Location: Left Arm)   Pulse (!) 103   Temp 99 F (37.2 C) (Oral)   Resp 19   Ht _0  (1.575 m)   Wt 68 kg   SpO2 97%   BMI 27.44 kg/m   Physical Exam Vitals and nursing note reviewed.  Constitutional:      General: She is not in acute distress.    Appearance: Normal appearance. She is well-developed and well-nourished. She is not ill-appearing or diaphoretic.     Comments: Well-appearing and in no distress  HENT:     Head: Normocephalic and atraumatic.     Nose: Congestion and rhinorrhea present.     Mouth/Throat:     Mouth: Oropharynx is clear and moist. Mucous membranes are dry.     Pharynx: Oropharynx is clear.  Eyes:     General:        Right eye: No discharge.        Left eye: No discharge.     Extraocular Movements: EOM normal.     Pupils: Pupils are equal, round, and reactive to light.  Cardiovascular:     Rate and Rhythm: Normal rate and regular rhythm.     Pulses: Intact distal pulses.     Heart sounds: Normal heart sounds. No murmur heard. No friction rub. No gallop.   Pulmonary:     Effort: Pulmonary effort is normal. No respiratory distress.     Breath sounds: Normal breath sounds. No wheezing or rales.     Comments: Respirations equal and unlabored, patient able to speak in full sentences, satting well on room air.  Lungs clear to auscultation bilaterally  Abdominal:     General: Bowel sounds are normal. There is no distension.     Palpations: Abdomen  is soft. There is no mass.     Tenderness: There is no abdominal tenderness. There is no guarding.     Comments: Abdomen soft, nondistended, nontender to palpation in all quadrants without guarding or peritoneal signs   Musculoskeletal:        General: No deformity or edema.     Cervical back: Neck supple.     Right lower leg: No edema.     Left lower leg: No edema.  Skin:    General: Skin is warm and dry.     Capillary Refill: Capillary refill takes less than 2 seconds.  Neurological:     Mental Status: She is alert and oriented to person, place, and time.     Coordination: Coordination normal.     Comments: Speech is clear, able to follow commands Moves extremities without ataxia, coordination intact  Psychiatric:        Mood and Affect: Mood normal.        Behavior: Behavior normal.     ED Results / Procedures / Treatments   Labs (all labs ordered are listed, but only abnormal results are displayed) Labs Reviewed  COMPREHENSIVE METABOLIC PANEL - Abnormal; Notable for the following components:      Result Value   Glucose, Bld 166 (*)    Calcium  8.3 (*)    Total Protein 6.3 (*)    Albumin 3.2 (*)    Alkaline Phosphatase 32 (*)    All other components within normal limits  CBC WITH DIFFERENTIAL/PLATELET - Abnormal; Notable for the following components:   WBC 3.9 (*)    RBC 3.68 (*)    Hemoglobin 11.7 (*)    All other components within normal limits    EKG None  Radiology DG Chest Portable 1 View  Result Date: 05/13/2020 CLINICAL DATA:  COVID positive. EXAM: PORTABLE CHEST 1 VIEW COMPARISON:  January 28, 2018 FINDINGS: The mediastinal contour and cardiac silhouette are normal. Mild hazy ground-glass opacity is identified throughout bilateral lungs. There is no pleural effusion. The bony structures are normal. IMPRESSION: Mild hazy ground-glass opacity identified throughout bilateral lungs. This can be seen in viral pneumonia. Electronically Signed   By: Abelardo Diesel  M.D.   On: 05/13/2020 12:54    Procedures Procedures (including critical care time)  Medications Ordered in ED Medications  sodium chloride 0.9 % bolus 1,000 mL (1,000 mLs Intravenous New Bag/Given 05/13/20 1238)  acetaminophen (TYLENOL) tablet 1,000 mg (1,000 mg Oral Given 05/13/20 1237)    ED Course  I have reviewed the triage vital signs and the nursing notes.  Pertinent labs & imaging results that were available during my care of the patient were reviewed by me and considered in my medical decision making (see chart for details).    MDM Rules/Calculators/A&P                          65 year old female Covid positive with almost 14 days of symptoms, concerned she is not improving.  On arrival patient is overall well-appearing with normal vitals.  Despite her history of COPD, she seems to be doing well from a respiratory standpoint, has dry nonproductive cough but reports shortness of breath she was experiencing previously seems to be somewhat improved, she was on a steroid taper that seems to have helped and continues on albuterol and an inhaled corticosteroid.  Her largest complaint today is persistent body aches, poor appetite and concern for dehydration.  She has not been taking anything for the body aches, reports she has had poor appetite and little p.o. intake.  Will check basic lab work and chest x-ray.  Will give IV fluids and Tylenol.  Patient has not been hypoxic at rest or with ambulation here in the ED is not experiencing chest pain or shortness of breath, low suspicion for PE in the setting of prolonged Covid course.  I have independently ordered, reviewed and interpreted all labs and imaging: CBC: Mild leukopenia as commonly seen with Covid, stable hemoglobin CMP: Glucose of 166, mild hypocalcemia, no other electrolyte derangements, normal renal and liver function.  Chest x-ray with mild hazy groundglass opacity throughout bilateral lungs, as typically seen with viral Covid  pneumonia.  Patient feels improved after IV fluids and Tylenol, she is remained stable with no hypoxia.  Unfortunately she is outside of the window for monoclonal antibody infusion.  Discussed continued supportive treatment at home and close PCP follow-up, provided return precautions as well, but do not feel patient meets criteria for admission today.  Patient expresses understanding and agreement.  Discharged home in good condition.  Kathy Tran was evaluated in Emergency Department on 05/13/2020 for the symptoms described in the history of present illness. She was evaluated in the context of the global COVID-19 pandemic, which necessitated consideration that  the patient might be at risk for infection with the SARS-CoV-2 virus that causes COVID-19. Institutional protocols and algorithms that pertain to the evaluation of patients at risk for COVID-19 are in a state of rapid change based on information released by regulatory bodies including the CDC and federal and state organizations. These policies and algorithms were followed during the patient's care in the ED.  Final Clinical Impression(s) / ED Diagnoses Final diagnoses:  None    Rx / DC Orders ED Discharge Orders    None       Janet Berlin 05/13/20 2221    Milton Ferguson, MD 05/18/20 1034

## 2020-05-19 DIAGNOSIS — J1282 Pneumonia due to coronavirus disease 2019: Secondary | ICD-10-CM | POA: Diagnosis not present

## 2020-05-19 DIAGNOSIS — U071 COVID-19: Secondary | ICD-10-CM | POA: Diagnosis not present

## 2020-05-24 DIAGNOSIS — D839 Common variable immunodeficiency, unspecified: Secondary | ICD-10-CM | POA: Diagnosis not present

## 2020-05-31 ENCOUNTER — Telehealth: Payer: Self-pay | Admitting: Allergy & Immunology

## 2020-05-31 MED ORDER — PREDNISONE 10 MG PO TABS: ORAL_TABLET | ORAL | 0 refills | Status: DC

## 2020-05-31 NOTE — Telephone Encounter (Signed)
Ear problem consists as the following: sounds as if there is water rushing, developing a headache, no fever. She does have vertigo and is concerned about the ear issue causing a more severe flare of her vertigo. Her nasal sprays are not working. Can you advise on further instructions for the patient.

## 2020-05-31 NOTE — Telephone Encounter (Signed)
Patient has an appointment on Friday, 06/02/20. I called her about the weather and she is going to keep her appointment and just call before coming. But, she is having trouble with an ear, after getting over Covid the first of the year. She said it is filled with fluid. She said she has had this before and was given an antibiotic.

## 2020-05-31 NOTE — Telephone Encounter (Signed)
Let's try prednisone first before going to antibiotics. I think those would be more effective. I sent in the prescription to her local pharmacy. Can someone call the patient tomorrow to let her know and see how she is feeling? Please reassure her that I am on call over the weekend in case she needs anything from [place].   Salvatore Marvel, MD [facility]

## 2020-06-01 NOTE — Telephone Encounter (Signed)
Called and spoke to patient and she informed me that she way headed over to get the medication the was sent in last night. She stated that she just got the notice that it was ready. Patient states that her night was good and just hopes that she can start feeling better soon. Patient was informed that if she needed anything that she should call our office with any questions or concerns.  Patient verbally agreed.

## 2020-06-02 ENCOUNTER — Ambulatory Visit (INDEPENDENT_AMBULATORY_CARE_PROVIDER_SITE_OTHER): Payer: Medicare HMO | Admitting: Allergy & Immunology

## 2020-06-02 ENCOUNTER — Other Ambulatory Visit: Payer: Self-pay

## 2020-06-02 ENCOUNTER — Encounter: Payer: Self-pay | Admitting: Allergy & Immunology

## 2020-06-02 VITALS — BP 132/78 | HR 88 | Temp 97.9°F | Resp 18 | Ht 62.99 in | Wt 156.0 lb

## 2020-06-02 DIAGNOSIS — D808 Other immunodeficiencies with predominantly antibody defects: Secondary | ICD-10-CM | POA: Diagnosis not present

## 2020-06-02 DIAGNOSIS — J310 Chronic rhinitis: Secondary | ICD-10-CM

## 2020-06-02 DIAGNOSIS — J4550 Severe persistent asthma, uncomplicated: Secondary | ICD-10-CM | POA: Diagnosis not present

## 2020-06-02 DIAGNOSIS — U071 COVID-19: Secondary | ICD-10-CM | POA: Diagnosis not present

## 2020-06-02 DIAGNOSIS — J1282 Pneumonia due to coronavirus disease 2019: Secondary | ICD-10-CM

## 2020-06-02 MED ORDER — NYSTATIN 100000 UNIT/ML MT SUSP: 5.0000 mL | Freq: Two times a day (BID) | OROMUCOSAL | 2 refills | Status: DC

## 2020-06-03 ENCOUNTER — Encounter: Payer: Self-pay | Admitting: Allergy & Immunology

## 2020-06-03 NOTE — Progress Notes (Signed)
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 She did not end up in the hospital and was able to be treated on an outpatient basis successfully.  She did not have any monoclonal antibodies.  She is very thankful that I commenced her to get the COVID-19 vaccine, as with her immune deficiency I was concerned that she would not do well had she contracted it before.  I do not think she needs anything else at this point in time.  She seems to have a good handle on her nebulizer and controller medications.  She is going to wean the nebulizer as tolerated and continue with her Berna Bue and [employer].  Her immunoglobulin infusions are going well.  We will plan to recheck labs at the next visit.   Subjective:   Kathy Tran is a 65 y.o. female presenting today for follow up of  Chief Complaint  Patient presents with  . Asthma    [name] has a history of the following: Patient Active Problem List   Diagnosis Date Noted  . 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 Hx of colonic polyps 01/07/2018  . Postmenopausal 07/02/2017  . 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 Recurrent infections 04/29/2017  . Non-allergic rhinitis 04/29/2017  . Severe persistent asthma without complication 78/24/2353  . Essential hypertension, benign 03/20/2017  . Asthma exacerbation 12/26/2016  . Bronchitis 09/25/2016  . Asthmatic bronchitis 07/18/2016  . DM type 2 causing vascular disease ([place]) 07/18/2016  . GERD (gastroesophageal reflux  disease) 07/18/2016  . Mixed hyperlipidemia 07/18/2016  . History of colonic polyps 06/12/2016    History obtained from: chart review and patient.   Kathy Tran is a 65 y.o. female presenting for a follow up visit.  She was last seen by me in December 2021 approximately 6 weeks ago.  At that time, her lung testing looked fantastic.  We continue with Pulmicort plus albuterol as needed and Breztri 2 puffs twice daily with a spacer.  She was also doing well on Fasenra every 8 weeks.  For her nonallergic rhinitis, would continue with Benadryl 25 mg at night.  Her immunoglobulin infusions were going well for her history of specific antibody deficiency.  In the interim, she contacted [place] at the end of December to let us know that she had contracted COVID-19.  She was wondering what else we needed to do.  We recommended some vitamin supplements as well as aspirin and getting a pulse ox.  We did give her the number to see if she will qualify for a monoclonal antibody infusion.  She was doing well at home until January 1.  She ended up going to the emergency room because she felt she was dehydrated.  A CMP and CBC were relatively unremarkable given the circumstances, although she did have some leukopenia and elevated glucose.  Chest x-ray demonstrated likely COVID-pneumonia.  She was given IV fluids.  She was not discharged on antibiotics because she had recently been given antibiotics on Christmas Eve.  She contacted [place] on January 19 and we sent in  prednisone because of some persistent ear fullness.  She has been compliant with her nasal rinses.  Since we contacted her a couple of days ago, she has been done really well.  She is very thankful that we gave her some prednisone.  She had just been very compliant with her nasal sprays since October or September 2021.  She denies any discharge from her ears.  She has completed Augmentin. Overall she is feeling much better, but she just wanted to come by so that we could  check her out.   She continues on her Breztri two puffs twice daily. She feels that this has been better with regards to not causing the thrush, as the Trelegy seemed to do. She is wondering if she can have additional nystatin. She uses this around once per week when she   Otherwise, there have been no changes to her past medical history, surgical history, family history, or social history.   Review of Systems  Constitutional: Negative.  Negative for chills, fever, malaise/fatigue and weight loss.  HENT: Negative.  Negative for congestion, ear discharge and ear pain.   Eyes: Negative for pain, discharge and redness.  Respiratory: Positive for cough. Negative for sputum production, shortness of breath and wheezing.   Cardiovascular: Negative.  Negative for chest pain and palpitations.  Gastrointestinal: Negative for abdominal pain, constipation, diarrhea, heartburn, nausea and vomiting.  Skin: Negative.  Negative for itching and rash.  Neurological: Negative for dizziness and headaches.  Endo/Heme/Allergies: Negative for environmental allergies. Does not bruise/bleed easily.       Objective:   Blood pressure 132/78, pulse 88, temperature 97.9 F (36.6 C), temperature source Temporal, resp. rate 18, height 5' 2.99" (1.6 m), weight 156 lb (70.8 kg), SpO2 98 %. Body mass index is 27.64 kg/m.   Physical Exam:  Physical Exam Constitutional:      Appearance: She is well-developed.  HENT:     Head: Normocephalic and atraumatic.     Right Ear: Tympanic membrane, ear canal and external ear normal.     Left Ear: Tympanic membrane, ear canal and external ear normal.     Nose: No nasal deformity, septal deviation, mucosal edema, rhinorrhea or epistaxis.     Right Turbinates: Enlarged and swollen.     Left Turbinates: Enlarged and swollen.     Right Sinus: No maxillary sinus tenderness or frontal sinus tenderness.     Left Sinus: No maxillary sinus tenderness or frontal sinus tenderness.      Mouth/Throat:     Mouth: Oropharynx is clear and moist. Mucous membranes are not pale and not dry.     Pharynx: Uvula midline.  Eyes:     General:        Right eye: No discharge.        Left eye: No discharge.     Extraocular Movements: EOM normal.     Conjunctiva/sclera: Conjunctivae normal.     Right eye: Right conjunctiva is not injected. No chemosis.    Left eye: Left conjunctiva is not injected. No chemosis.    Pupils: Pupils are equal, round, and reactive to light.  Cardiovascular:     Rate and Rhythm: Normal rate and regular rhythm.     Heart sounds: Normal heart sounds.  Pulmonary:     Effort: Pulmonary effort is normal. No tachypnea, accessory muscle usage or respiratory distress.     Breath sounds: Normal breath sounds. No wheezing, rhonchi or rales.     Comments: Moving air well in  all lung fields. No increased work of breathing noted.  Chest:     Chest wall: No tenderness.  Lymphadenopathy:     Cervical: No cervical adenopathy.  Skin:    Coloration: Skin is not pale.     Findings: No abrasion, erythema, petechiae or rash. Rash is not papular, urticarial or vesicular.  Neurological:     Mental Status: She is alert.  Psychiatric:        Mood and Affect: Mood and affect normal.        Behavior: Behavior is cooperative.      Diagnostic studies: none     Salvatore Marvel, MD  [facility]

## 2020-06-09 ENCOUNTER — Ambulatory Visit (INDEPENDENT_AMBULATORY_CARE_PROVIDER_SITE_OTHER): Payer: Medicare HMO

## 2020-06-09 ENCOUNTER — Other Ambulatory Visit: Payer: Self-pay

## 2020-06-09 DIAGNOSIS — J4550 Severe persistent asthma, uncomplicated: Secondary | ICD-10-CM

## 2020-06-15 ENCOUNTER — Other Ambulatory Visit (HOSPITAL_COMMUNITY): Payer: Self-pay | Admitting: Internal Medicine

## 2020-06-15 DIAGNOSIS — U071 COVID-19: Secondary | ICD-10-CM

## 2020-06-16 ENCOUNTER — Ambulatory Visit (HOSPITAL_COMMUNITY)
Admission: RE | Admit: 2020-06-16 | Discharge: 2020-06-16 | Disposition: A | Discharge status: Home / Self Care | Payer: Medicare HMO | Source: Ambulatory Visit | Attending: Internal Medicine | Admitting: Internal Medicine

## 2020-06-16 ENCOUNTER — Other Ambulatory Visit: Payer: Self-pay

## 2020-06-16 DIAGNOSIS — U071 COVID-19: Secondary | ICD-10-CM | POA: Insufficient documentation

## 2020-06-16 DIAGNOSIS — J189 Pneumonia, unspecified organism: Secondary | ICD-10-CM | POA: Diagnosis not present

## 2020-06-23 DIAGNOSIS — D839 Common variable immunodeficiency, unspecified: Secondary | ICD-10-CM | POA: Diagnosis not present

## 2020-06-29 DIAGNOSIS — E1159 Type 2 diabetes mellitus with other circulatory complications: Secondary | ICD-10-CM | POA: Diagnosis not present

## 2020-06-29 DIAGNOSIS — Z1159 Encounter for screening for other viral diseases: Secondary | ICD-10-CM | POA: Diagnosis not present

## 2020-07-05 ENCOUNTER — Other Ambulatory Visit: Payer: Self-pay

## 2020-07-05 MED ORDER — BREZTRI AEROSPHERE 160-9-4.8 MCG/ACT IN AERO: INHALATION_SPRAY | RESPIRATORY_TRACT | 1 refills | Status: DC

## 2020-07-05 MED ORDER — NYSTATIN 100000 UNIT/ML MT SUSP: 5.0000 mL | Freq: Two times a day (BID) | OROMUCOSAL | 1 refills | Status: DC

## 2020-07-05 NOTE — Telephone Encounter (Signed)
90 day supply okayed by Dr. Ernst Bowler.

## 2020-07-06 DIAGNOSIS — I10 Essential (primary) hypertension: Secondary | ICD-10-CM | POA: Diagnosis not present

## 2020-07-06 DIAGNOSIS — E1100 Type 2 diabetes mellitus with hyperosmolarity without nonketotic hyperglycemic-hyperosmolar coma (NKHHC): Secondary | ICD-10-CM | POA: Diagnosis not present

## 2020-07-06 DIAGNOSIS — J4551 Severe persistent asthma with (acute) exacerbation: Secondary | ICD-10-CM | POA: Diagnosis not present

## 2020-07-06 DIAGNOSIS — R7309 Other abnormal glucose: Secondary | ICD-10-CM | POA: Diagnosis not present

## 2020-07-21 DIAGNOSIS — D839 Common variable immunodeficiency, unspecified: Secondary | ICD-10-CM | POA: Diagnosis not present

## 2020-08-04 ENCOUNTER — Other Ambulatory Visit: Payer: Self-pay

## 2020-08-04 ENCOUNTER — Ambulatory Visit (INDEPENDENT_AMBULATORY_CARE_PROVIDER_SITE_OTHER): Payer: Medicare HMO

## 2020-08-04 DIAGNOSIS — J4550 Severe persistent asthma, uncomplicated: Secondary | ICD-10-CM

## 2020-08-10 DIAGNOSIS — I10 Essential (primary) hypertension: Secondary | ICD-10-CM | POA: Diagnosis not present

## 2020-08-10 DIAGNOSIS — E119 Type 2 diabetes mellitus without complications: Secondary | ICD-10-CM | POA: Diagnosis not present

## 2020-08-18 DIAGNOSIS — D839 Common variable immunodeficiency, unspecified: Secondary | ICD-10-CM | POA: Diagnosis not present

## 2020-08-31 DIAGNOSIS — E119 Type 2 diabetes mellitus without complications: Secondary | ICD-10-CM | POA: Diagnosis not present

## 2020-08-31 DIAGNOSIS — E7800 Pure hypercholesterolemia, unspecified: Secondary | ICD-10-CM | POA: Diagnosis not present

## 2020-08-31 DIAGNOSIS — H5200 Hypermetropia, unspecified eye: Secondary | ICD-10-CM | POA: Diagnosis not present

## 2020-08-31 DIAGNOSIS — Z0100 Encounter for examination of eyes and vision without abnormal findings: Secondary | ICD-10-CM | POA: Diagnosis not present

## 2020-09-05 ENCOUNTER — Telehealth: Payer: Self-pay | Admitting: *Deleted

## 2020-09-05 MED ORDER — BUDESONIDE 0.5 MG/2ML IN SUSP: RESPIRATORY_TRACT | 5 refills | Status: DC

## 2020-09-05 NOTE — Telephone Encounter (Signed)
Received rx request for budesonide 0.5 solution- okay to send? I did not see this on the last OV notes. Please advise.

## 2020-09-05 NOTE — Telephone Encounter (Signed)
OK for refill. Thank you

## 2020-09-08 ENCOUNTER — Other Ambulatory Visit: Payer: Self-pay

## 2020-09-08 ENCOUNTER — Encounter: Payer: Self-pay | Admitting: Allergy & Immunology

## 2020-09-08 ENCOUNTER — Ambulatory Visit (INDEPENDENT_AMBULATORY_CARE_PROVIDER_SITE_OTHER): Payer: Medicare HMO | Admitting: Allergy & Immunology

## 2020-09-08 VITALS — BP 122/68 | HR 71 | Temp 98.0°F | Resp 16 | Ht 62.0 in | Wt 158.6 lb

## 2020-09-08 DIAGNOSIS — J4550 Severe persistent asthma, uncomplicated: Secondary | ICD-10-CM | POA: Diagnosis not present

## 2020-09-08 DIAGNOSIS — J310 Chronic rhinitis: Secondary | ICD-10-CM | POA: Diagnosis not present

## 2020-09-08 DIAGNOSIS — D808 Other immunodeficiencies with predominantly antibody defects: Secondary | ICD-10-CM | POA: Diagnosis not present

## 2020-09-08 MED ORDER — IPRATROPIUM BROMIDE 0.03 % NA SOLN: 2.0000 | Freq: Three times a day (TID) | NASAL | 3 refills | Status: DC

## 2020-09-08 NOTE — Progress Notes (Signed)
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  - Samples of Breztri provided today to help you through the donut Tran.  - Daily controller medication(s): Breztri two puffs twice daily + Singulair 10mg  daily + Fasenra every 8 weeks + DuoNeb 1-2 times daily - Rescue medications: ProAir 4 puffs every 4-6 hours as needed or DuoNeb nebulizer one vial every 4-6 hours as needed  - Changes during respiratory flares: Add on Pulmicort (budesonide) 0.5 mg twice daily via nebulizer for 1-2 weeks - Asthma control goals:  * Full participation in all desired activities (may need albuterol before activity) * Albuterol use two time or less a week on average (not counting use with activity) * Cough interfering with sleep two time or less a month * Oral steroids no more than once a year * No hospitalizations     2. Chronic non-allergic rhinitis - Continue with Xyzal (levocetirizine) 5mg  daily. - Continue with Benadryl 25mg  at night.   3. 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  - Continue with all of the premedications, including hydration.  - Repeat labs ordered.   4. Return in about 6 months (around 03/10/2021).   Subjective:   Kathy Tran is a 65 y.o. female presenting today for follow up of  Chief Complaint  Patient presents with  . Severe persistent asthma without complication    [name] has a history of the following: Patient Active Problem List   Diagnosis Date  Noted  . 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 Hx of colonic polyps 01/07/2018  . Postmenopausal 07/02/2017  . 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 Recurrent infections 04/29/2017  . Non-allergic rhinitis 04/29/2017  . Severe persistent asthma without complication 99/83/3825  . Essential hypertension, benign 03/20/2017  . Asthma exacerbation 12/26/2016  . Bronchitis 09/25/2016  . Asthmatic bronchitis 07/18/2016  . DM type 2 causing vascular disease ([place]) 07/18/2016  . GERD (gastroesophageal reflux disease) 07/18/2016  . Mixed hyperlipidemia 07/18/2016  . History of colonic polyps 06/12/2016    History obtained from: chart review and patient.  Kathy Tran is a 65 y.o. female presenting for a follow up visit.  She is well-known to this practice.  She has a history of specific antibody deficiency and has been fairly stable on monthly IVIG, which is administered in her home.  At the last visit in January, she had recently contracted COVID-19 and thankfully was able to manage that the and ER visits and then outpatient thereafter.  She was not admitted and was intubated.  At that time, she was continued her shortness of breath but overall was slowly recovering.  We recommended continuing with Pulmicort twice daily as well as DuoNeb twice daily.  We also continued her on Breztri 2 puffs twice daily with spacer.  She remains on [place] as well.  Since last visit, she has done very well.  She is not quite back to her baseline, but is feeling much better than she was when I saw  her in January.  Asthma/Respiratory Symptom History: She remains on her daily inhaler 2 puffs twice daily.  She has not needed additional ER visits or hospitalizations. She is in the doughnut Tran right now. This resets around the end of the year. The Breztri cut down on the thrush in her mouth. This was much worse with the Trelegy. Berna Bue is going well. She has  weaned herself off the Pulmicort nebulizer. She is doing DuoNeb twice daily. She is hoping that once the weather stabilizes, she can wean back down to twice daily.  She did see the pulmonologist once since Dr. Luan Pulling left, but is not planning to return as we manage her breathing medications.  Of note, she does have a PCP, Dr. Willey Blade, and she sees regularly.  Allergic Rhinitis Symptom History: She does endorse some postnasal drip and has been terrible since the tree pollen started.  She does not use any nose sprays and creams as when I can talk about it.  She does have a nasal saline rinse that she uses when she is particularly bad.  She remains on her Xyzal.  Specific Antibody Deficiency History: She remains on her IVIG.  She has done very well with this.  She has not needed antibiotics in she had COVID-pneumonia in December.  She did not have any side effects from her immunoglobulin.  She is due for immunodeficiency labs today.  Otherwise, there have been no changes to her past medical history, surgical history, family history, or social history.    Review of Systems  Constitutional: Negative.  Negative for chills, fever, malaise/fatigue and weight loss.  HENT: Positive for congestion. Negative for ear discharge, ear pain and sinus pain.        Positive for postnasal drip.  Eyes: Negative for pain, discharge and redness.  Respiratory: Negative for cough, sputum production, shortness of breath and wheezing.   Cardiovascular: Negative.  Negative for chest pain and palpitations.  Gastrointestinal: Negative for abdominal pain, constipation, diarrhea, heartburn, nausea and vomiting.  Skin: Negative.  Negative for itching and rash.  Neurological: Negative for dizziness and headaches.  Endo/Heme/Allergies: Negative for environmental allergies. Does not bruise/bleed easily.       Objective:   Blood pressure 122/68, pulse 71, temperature 98 F (36.7 C), temperature source Temporal, resp. rate 16,  height 5\' 2"  (1.575 m), weight 158 lb 9.6 oz (71.9 kg), SpO2 97 %. Body mass index is 29.01 kg/m.   Physical Exam:  Physical Exam Constitutional:      Appearance: She is well-developed.  HENT:     Head: Normocephalic and atraumatic.     Right Ear: Tympanic membrane, ear canal and external ear normal.     Left Ear: Tympanic membrane, ear canal and external ear normal.     Nose: No nasal deformity, septal deviation, mucosal edema or rhinorrhea.     Right Turbinates: Enlarged and swollen.     Left Turbinates: Enlarged and swollen.     Right Sinus: No maxillary sinus tenderness or frontal sinus tenderness.     Left Sinus: No maxillary sinus tenderness or frontal sinus tenderness.     Comments: She does have some crusting in her nasopharynx.     Mouth/Throat:     Mouth: Mucous membranes are not pale and not dry.     Pharynx: Uvula midline.  Eyes:     General:        Right eye: No discharge.        Left eye: No discharge.  Conjunctiva/sclera: Conjunctivae normal.     Right eye: Right conjunctiva is not injected. No chemosis.    Left eye: Left conjunctiva is not injected. No chemosis.    Pupils: Pupils are equal, round, and reactive to light.  Cardiovascular:     Rate and Rhythm: Normal rate and regular rhythm.     Heart sounds: Normal heart sounds.  Pulmonary:     Effort: Pulmonary effort is normal. No tachypnea, accessory muscle usage or respiratory distress.     Breath sounds: Normal breath sounds. No wheezing, rhonchi or rales.     Comments: Moving air well in all lung fields. Chest:     Chest wall: No tenderness.  Lymphadenopathy:     Cervical: No cervical adenopathy.  Skin:    General: Skin is warm.     Capillary Refill: Capillary refill takes less than 2 seconds.     Coloration: Skin is not pale.     Findings: No abrasion, erythema, petechiae or rash. Rash is not papular, urticarial or vesicular.     Comments: No eczematous or urticarial lesions noted.   Neurological:     Mental Status: She is alert.  Psychiatric:        Behavior: Behavior is cooperative.      Diagnostic studies:    Spirometry: results normal (FEV1: 1.62/71%, FVC: 2.05/71%, FEV1/FVC: 79%).    Spirometry consistent with normal pattern.   Allergy Studies: none       Salvatore Marvel, MD  [facility] [place]

## 2020-09-08 NOTE — Patient Instructions (Addendum)
1. 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  - Samples of Breztri provided today to help you through the donut hole.  - Daily controller medication(s): Breztri two puffs twice daily + Singulair 10mg  daily + Fasenra every 8 weeks + DuoNeb 1-2 times daily - Rescue medications: ProAir 4 puffs every 4-6 hours as needed or DuoNeb nebulizer one vial every 4-6 hours as needed  - Changes during respiratory flares: Add on Pulmicort (budesonide) 0.5 mg twice daily via nebulizer for 1-2 weeks - Asthma control goals:  * Full participation in all desired activities (may need albuterol before activity) * Albuterol use two time or less a week on average (not counting use with activity) * Cough interfering with sleep two time or less a month * Oral steroids no more than once a year * No hospitalizations     2. Chronic non-allergic rhinitis - Continue with Xyzal (levocetirizine) 5mg  daily. - Continue with Benadryl 25mg  at night.   3. 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  - Continue with all of the premedications, including hydration.  - Repeat labs ordered.   4. Return in about 6 months (around 03/10/2021).    Please inform us of any Emergency Department visits, hospitalizations, or changes in symptoms. Call us before going to the ED for breathing or allergy symptoms since we might be able to fit you in for a sick visit. Feel free to contact us anytime with any questions, problems, or concerns.  It was a pleasure to see you again today!  Websites that have reliable patient information: 1. American Academy of Asthma, Allergy, and Immunology: www.aaaai.org 2. Food Allergy Research and Education (FARE): foodallergy.org 3. Mothers of Asthmatics: http://www.asthmacommunitynetwork.org 4. American College of Allergy, Asthma, and Immunology: www.acaai.org   COVID-19 Vaccine Information can be found at:  ShippingScam.co.uk For questions related to vaccine distribution or appointments, please email [email or web address] or call [phone].   We realize that you might be concerned about having an allergic reaction to the COVID19 vaccines. To help with that concern, WE ARE OFFERING THE COVID19 VACCINES IN OUR OFFICE! Ask the front desk for dates!     "Like" [place] on Facebook and Instagram for our latest updates!      A healthy democracy works best when [employer] participate! Make sure you are registered to vote! If you have moved or changed any of your contact information, you will need to get this updated before voting!  In some cases, you MAY be able to register to vote online: [email or web address]

## 2020-09-09 DIAGNOSIS — E119 Type 2 diabetes mellitus without complications: Secondary | ICD-10-CM | POA: Diagnosis not present

## 2020-09-09 DIAGNOSIS — I10 Essential (primary) hypertension: Secondary | ICD-10-CM | POA: Diagnosis not present

## 2020-09-15 DIAGNOSIS — D839 Common variable immunodeficiency, unspecified: Secondary | ICD-10-CM | POA: Diagnosis not present

## 2020-09-19 ENCOUNTER — Other Ambulatory Visit (HOSPITAL_COMMUNITY): Payer: Self-pay | Admitting: Internal Medicine

## 2020-09-19 ENCOUNTER — Other Ambulatory Visit: Payer: Self-pay | Admitting: Internal Medicine

## 2020-09-19 DIAGNOSIS — J1282 Pneumonia due to coronavirus disease 2019: Secondary | ICD-10-CM

## 2020-09-25 ENCOUNTER — Ambulatory Visit (HOSPITAL_COMMUNITY)
Admission: RE | Admit: 2020-09-25 | Discharge: 2020-09-25 | Disposition: A | Discharge status: Home / Self Care | Payer: Medicare HMO | Source: Ambulatory Visit | Attending: Internal Medicine | Admitting: Internal Medicine

## 2020-09-25 ENCOUNTER — Other Ambulatory Visit: Payer: Self-pay

## 2020-09-25 DIAGNOSIS — Z8701 Personal history of pneumonia (recurrent): Secondary | ICD-10-CM | POA: Diagnosis not present

## 2020-09-25 DIAGNOSIS — J1282 Pneumonia due to coronavirus disease 2019: Secondary | ICD-10-CM | POA: Insufficient documentation

## 2020-09-25 DIAGNOSIS — U071 COVID-19: Secondary | ICD-10-CM | POA: Insufficient documentation

## 2020-09-26 DIAGNOSIS — D808 Other immunodeficiencies with predominantly antibody defects: Secondary | ICD-10-CM | POA: Diagnosis not present

## 2020-09-26 DIAGNOSIS — E1159 Type 2 diabetes mellitus with other circulatory complications: Secondary | ICD-10-CM | POA: Diagnosis not present

## 2020-09-27 LAB — CMP14+EGFR
ALT: 17 IU/L (ref 0–32)
AST: 17 IU/L (ref 0–40)
Albumin/Globulin Ratio: 2 (ref 1.2–2.2)
Albumin: 4.4 g/dL (ref 3.8–4.8)
Alkaline Phosphatase: 58 IU/L (ref 44–121)
BUN/Creatinine Ratio: 23 (ref 12–28)
BUN: 16 mg/dL (ref 8–27)
Bilirubin Total: 0.4 mg/dL (ref 0.0–1.2)
CO2: 24 mmol/L (ref 20–29)
Calcium: 9.7 mg/dL (ref 8.7–10.3)
Chloride: 103 mmol/L (ref 96–106)
Creatinine, Ser: 0.71 mg/dL (ref 0.57–1.00)
Globulin, Total: 2.2 g/dL (ref 1.5–4.5)
Glucose: 93 mg/dL (ref 65–99)
Potassium: 4.3 mmol/L (ref 3.5–5.2)
Sodium: 141 mmol/L (ref 134–144)
Total Protein: 6.6 g/dL (ref 6.0–8.5)
eGFR: 96 mL/min/{1.73_m2} (ref >59)

## 2020-09-27 LAB — IGG, IGA, IGM
IgA/Immunoglobulin A, Serum: 97 mg/dL (ref 87–352)
IgG (Immunoglobin G), Serum: 1115 mg/dL (ref 586–1602)
IgM (Immunoglobulin M), Srm: 56 mg/dL (ref 26–217)

## 2020-09-29 ENCOUNTER — Ambulatory Visit (INDEPENDENT_AMBULATORY_CARE_PROVIDER_SITE_OTHER): Payer: Medicare HMO

## 2020-09-29 ENCOUNTER — Other Ambulatory Visit: Payer: Self-pay

## 2020-09-29 DIAGNOSIS — J4550 Severe persistent asthma, uncomplicated: Secondary | ICD-10-CM | POA: Diagnosis not present

## 2020-10-05 ENCOUNTER — Telehealth: Payer: Self-pay | Admitting: *Deleted

## 2020-10-05 DIAGNOSIS — I10 Essential (primary) hypertension: Secondary | ICD-10-CM | POA: Diagnosis not present

## 2020-10-05 DIAGNOSIS — R7309 Other abnormal glucose: Secondary | ICD-10-CM | POA: Diagnosis not present

## 2020-10-05 DIAGNOSIS — E119 Type 2 diabetes mellitus without complications: Secondary | ICD-10-CM | POA: Diagnosis not present

## 2020-10-05 NOTE — Telephone Encounter (Addendum)
[facility] had called and advised patient had stated to them that Dr Ernst Bowler was going to change her Privigen dose.  I looked at lab note and Dr Ernst Bowler talking into dragon had indicated dose change on note but had advised me no change.  Per Dr Ernst Bowler stay on same dose and advised [facility] and patient and apologized for error and confusion. Patient stated ok with her she will do whatever Dr gallagher advises

## 2020-10-06 NOTE — Telephone Encounter (Signed)
Major fail - sorry!   Kathy Marvel, MD [facility]ergy and [facility] [place]

## 2020-10-10 DIAGNOSIS — E119 Type 2 diabetes mellitus without complications: Secondary | ICD-10-CM | POA: Diagnosis not present

## 2020-10-10 DIAGNOSIS — I10 Essential (primary) hypertension: Secondary | ICD-10-CM | POA: Diagnosis not present

## 2020-10-13 DIAGNOSIS — D839 Common variable immunodeficiency, unspecified: Secondary | ICD-10-CM | POA: Diagnosis not present

## 2020-10-20 ENCOUNTER — Ambulatory Visit (HOSPITAL_COMMUNITY)
Admission: RE | Admit: 2020-10-20 | Discharge: 2020-10-20 | Disposition: A | Discharge status: Home / Self Care | Payer: Medicare HMO | Source: Ambulatory Visit | Attending: Internal Medicine | Admitting: Internal Medicine

## 2020-10-20 ENCOUNTER — Other Ambulatory Visit: Payer: Self-pay

## 2020-10-20 ENCOUNTER — Other Ambulatory Visit (HOSPITAL_COMMUNITY): Payer: Self-pay | Admitting: Internal Medicine

## 2020-10-20 DIAGNOSIS — M549 Dorsalgia, unspecified: Secondary | ICD-10-CM

## 2020-10-20 DIAGNOSIS — M25551 Pain in right hip: Secondary | ICD-10-CM

## 2020-10-20 DIAGNOSIS — M545 Low back pain, unspecified: Secondary | ICD-10-CM | POA: Diagnosis not present

## 2020-10-30 DIAGNOSIS — M4727 Other spondylosis with radiculopathy, lumbosacral region: Secondary | ICD-10-CM | POA: Diagnosis not present

## 2020-10-30 DIAGNOSIS — M5126 Other intervertebral disc displacement, lumbar region: Secondary | ICD-10-CM | POA: Diagnosis not present

## 2020-10-31 ENCOUNTER — Other Ambulatory Visit: Payer: Self-pay | Admitting: Sports Medicine

## 2020-10-31 ENCOUNTER — Other Ambulatory Visit (HOSPITAL_COMMUNITY): Payer: Self-pay | Admitting: Sports Medicine

## 2020-10-31 DIAGNOSIS — M5126 Other intervertebral disc displacement, lumbar region: Secondary | ICD-10-CM

## 2020-10-31 DIAGNOSIS — M5441 Lumbago with sciatica, right side: Secondary | ICD-10-CM

## 2020-11-09 DIAGNOSIS — I10 Essential (primary) hypertension: Secondary | ICD-10-CM | POA: Diagnosis not present

## 2020-11-09 DIAGNOSIS — E119 Type 2 diabetes mellitus without complications: Secondary | ICD-10-CM | POA: Diagnosis not present

## 2020-11-10 DIAGNOSIS — D839 Common variable immunodeficiency, unspecified: Secondary | ICD-10-CM | POA: Diagnosis not present

## 2020-11-14 ENCOUNTER — Ambulatory Visit (INDEPENDENT_AMBULATORY_CARE_PROVIDER_SITE_OTHER): Payer: Medicare HMO | Admitting: Family

## 2020-11-14 ENCOUNTER — Other Ambulatory Visit: Payer: Self-pay

## 2020-11-14 ENCOUNTER — Encounter: Payer: Self-pay | Admitting: Family

## 2020-11-14 VITALS — BP 120/88 | HR 80 | Temp 98.0°F | Resp 16 | Ht 62.0 in | Wt 158.0 lb

## 2020-11-14 DIAGNOSIS — D801 Nonfamilial hypogammaglobulinemia: Secondary | ICD-10-CM

## 2020-11-14 DIAGNOSIS — J019 Acute sinusitis, unspecified: Secondary | ICD-10-CM | POA: Diagnosis not present

## 2020-11-14 DIAGNOSIS — J310 Chronic rhinitis: Secondary | ICD-10-CM | POA: Diagnosis not present

## 2020-11-14 DIAGNOSIS — R519 Headache, unspecified: Secondary | ICD-10-CM | POA: Diagnosis not present

## 2020-11-14 DIAGNOSIS — J4551 Severe persistent asthma with (acute) exacerbation: Secondary | ICD-10-CM

## 2020-11-14 MED ORDER — MONTELUKAST SODIUM 10 MG PO TABS: ORAL_TABLET | ORAL | 5 refills | Status: DC

## 2020-11-14 MED ORDER — IPRATROPIUM-ALBUTEROL 0.5-2.5 (3) MG/3ML IN SOLN: 3.0000 mL | Freq: Three times a day (TID) | RESPIRATORY_TRACT | 1 refills | Status: DC

## 2020-11-14 MED ORDER — VENTOLIN HFA 108 (90 BASE) MCG/ACT IN AERS: 2.0000 | INHALATION_SPRAY | RESPIRATORY_TRACT | 1 refills | Status: DC | PRN

## 2020-11-14 MED ORDER — BUDESONIDE 0.5 MG/2ML IN SUSP: RESPIRATORY_TRACT | 1 refills | Status: DC

## 2020-11-14 MED ORDER — AMOXICILLIN-POT CLAVULANATE 875-125 MG PO TABS
ORAL_TABLET | ORAL | 0 refills | Status: DC
Start: 2020-11-14 — End: 2021-01-05

## 2020-11-14 MED ORDER — PREDNISONE 10 MG PO TABS: ORAL_TABLET | ORAL | 0 refills | Status: DC

## 2020-11-14 MED ORDER — BREZTRI AEROSPHERE 160-9-4.8 MCG/ACT IN AERO: INHALATION_SPRAY | RESPIRATORY_TRACT | 5 refills | Status: DC

## 2020-11-14 NOTE — Patient Instructions (Addendum)
1. Severe persistent asthma with acute exacerbation- with eosinophilic phenotype (on Fasenra)  -Start prednisone 10 mg once a day for 5 days then stop - Daily controller medication(s): Breztri two puffs twice daily + Singulair 10mg  daily + Fasenra every 8 weeks + DuoNeb 1-2 times daily - Rescue medications: ProAir 4 puffs every 4-6 hours as needed or DuoNeb nebulizer one vial every 4-6 hours as needed  - Changes during respiratory flares: Add on Pulmicort (budesonide) 0.5 mg twice daily via nebulizer for 1-2 weeks - Asthma control goals:  * Full participation in all desired activities (may need albuterol before activity) * Albuterol use two time or less a week on average (not counting use with activity) * Cough interfering with sleep two time or less a month * Oral steroids no more than once a year * No hospitalizations     2. Chronic non-allergic rhinitis - Continue with Benadryl 25mg  at night.   3. 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  - Continue with all of the premedications, including hydration.   4.  Acute sinus infection Start Augmentin 875 mg taking 1 tablet twice a day for 3 weeks Start prednisone 10 mg as above If symptoms still persist we will get a sinus x-ray at your next office visit  If heaviness in chest persists or worsens please proceed to the emergency room  Schedule a follow up appointment in 4 weeks or sooner with Dr. Ernst Bowler

## 2020-11-14 NOTE — Progress Notes (Addendum)
[address] [place] [address] Dept: [phone]  FOLLOW UP NOTE  Patient ID: [name], female    DOB: 1955-12-15  Age: 65 y.o. MRN: [id number] Date of Office Visit: 11/14/2020  Assessment  Chief Complaint: Asthma (Heavy in the chest, shortness of breath, has a pain in the middle of her upper back when sneezing, congestion, pain in her ears, and a cough )  HPI MAKENAH KARAS is a 65 year old female who presents today for an acute visit.  She was last seen on September 08, 2020 by Dr. 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  Severe persistent asthma is reported as not well controlled with Breztri 2 puffs twice a day with spacer, Singulair 10 mg once a day, DuoNeb 1-2 times a day, Fasenra injections every 8 weeks, Pulmicort 0.5 mg twice a day during respiratory flares, and ProAir as needed.  She reports that on Sunday she heard some wheezing and used DuoNeb.  Then yesterday she noticed a heaviness in her chest and back.  She denies any pain in her chest.  She reports when she takes a deep breath it hurts in her back, "in between her Angel wings".  She reports a hacky cough with clear to white sputum and shortness of breath.  She denies any tightness in her chest, fever, chills, and body aches.  She also denies any nocturnal awakenings due to breathing problems.  She used her albuterol 1 time on Sunday and 2 times on Monday.  She also started her Pulmicort 0.5 mg twice a day yesterday.  She has not made any trips to the emergency room or urgent care due to breathing problems.  Approximately 3 weeks ago she was given 5 days worth of steroids to help calm down her nerve in her spine.  She reports that she is going to have an MRI tomorrow for 5 bulging disks and a pinched nerve.  She also notes that there is curvature noted on L2 and that she has arthritis.  Chronic known rhinitis is reported as not well controlled with  Benadryl 25 mg at night.  She reports that she does not take Xyzal during the day.  She reports postnasal drip, bilateral ear pain-right ear pain hurts worse than left ear pain,  green rhinorrhea, and headache.  She denies sore throat, fever, chills, nasal congestion, sinus tenderness and body aches.  She reports that she has been blowing green rhinorrhea since having COVID-19 and pneumonia in January of this year.  She reports that she has not being given an antibiotic for a sinus infection since she was last seen..  Hypogammaglobulinemia is reported as controlled.  She reports that she was due for her IVIG infusion on July 2 or 3 but did not want to the infusion over the holiday, so she is going to do the infusion this weekend.  She has not had any infections since her last office visit.  Drug Allergies:  Allergies  Allergen Reactions   Sulfa Antibiotics Anaphylaxis    Review of Systems: Review of Systems  Constitutional:  Negative for chills and fever.  HENT:         Reports green rhinorrhea since having pneumonia in January.  Also reports her right ear hurting, and postnasal drip.  Denies sore throat, nasal congestion, and sinus tenderness  Eyes:        Reports watery eyes, denies itchy eyes  Respiratory:  Positive for cough, shortness of breath  and wheezing.        Reports productive cough at times that can be clear to white.  Also reports wheezing on Sunday, and shortness of breath.  Denies tightness in her chest.  Cardiovascular:  Negative for chest pain and palpitations.  Gastrointestinal:  Negative for heartburn.  Genitourinary:  Negative for dysuria.  Musculoskeletal:  Negative for myalgias.  Skin:  Negative for itching and rash.  Neurological:  Positive for headaches.  Endo/Heme/Allergies:  Negative for environmental allergies.    Physical Exam: BP 120/88   Pulse 80   Temp 98 F (36.7 C)   Resp 16   Ht 5\' 2"  (1.575 m)   Wt 158 lb (71.7 kg)   SpO2 97%   BMI 28.90 kg/m     Physical Exam Constitutional:      Appearance: Normal appearance.  HENT:     Head: Normocephalic and atraumatic.     Comments: Pharynx normal, eyes normal, ears normal, nose: Green dried nasal drainage noted in left nostril    Right Ear: Tympanic membrane, ear canal and external ear normal.     Left Ear: Tympanic membrane, ear canal and external ear normal.     Mouth/Throat:     Mouth: Mucous membranes are moist.     Pharynx: Oropharynx is clear.  Eyes:     Conjunctiva/sclera: Conjunctivae normal.  Cardiovascular:     Rate and Rhythm: Normal rate and regular rhythm.     Heart sounds: Normal heart sounds.  Pulmonary:     Effort: Pulmonary effort is normal.     Breath sounds: Normal breath sounds.     Comments: Lungs clear to auscultation Musculoskeletal:     Cervical back: Neck supple.  Skin:    General: Skin is warm.  Neurological:     Mental Status: She is alert and oriented to person, place, and time.  Psychiatric:        Mood and Affect: Mood normal.        Behavior: Behavior normal.        Thought Content: Thought content normal.        Judgment: Judgment normal.    Diagnostics: FVC 1.91 L, FEV1 1.66 L.  Predicted FVC 2.99 L, predicted FEV1 2.30 L.  Spirometry indicates moderate restriction.  Spirometry is consistent with previous spirometry.  COVID-19 rapid test today ENI:[id number]  Assessment and Plan: 1. Severe persistent asthma with acute exacerbation   2. Acute non-recurrent sinusitis, unspecified location   3. Hypogammaglobulinemia ([place])   4. Non-allergic rhinitis     Meds ordered this encounter  Medications   amoxicillin-clavulanate (AUGMENTIN) 875-125 MG tablet    Sig: Take one tablet twice a day for 3 weeks    Dispense:  42 tablet    Refill:  0   predniSONE (DELTASONE) 10 MG tablet    Sig: Take one tablet once a day for 5 days, then stop    Dispense:  5 tablet    Refill:  0     Patient Instructions  1. 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)  -Start prednisone 10 mg once a day for 5 days then stop - Daily controller medication(s): Breztri two puffs twice daily + Singulair 10mg  daily + Fasenra every 8 weeks + DuoNeb 1-2 times daily - Rescue medications: ProAir 4 puffs every 4-6 hours as needed or DuoNeb nebulizer one vial every 4-6 hours as needed  - Changes during respiratory flares: Add on Pulmicort (budesonide) 0.5 mg twice  daily via nebulizer for 1-2 weeks - Asthma control goals:  * Full participation in all desired activities (may need albuterol before activity) * Albuterol use two time or less a week on average (not counting use with activity) * Cough interfering with sleep two time or less a month * Oral steroids no more than once a year * No hospitalizations     2. Chronic non-allergic rhinitis - Continue with Benadryl 25mg  at night.   3. 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  - Continue with all of the premedications, including hydration.   4.  Acute sinus infection Start Augmentin 875 mg taking 1 tablet twice a day for 3 weeks Start prednisone 10 mg as above If symptoms still persist we will get a sinus x-ray at your next office visit  If heaviness in chest persists or worsens please proceed to the emergency room  Schedule a follow up appointment in 4 weeks or sooner with Dr. Ernst Bowler      Return in about 4 weeks (around 12/12/2020), or if symptoms worsen or fail to improve.    Thank you for the opportunity to care for this patient.  Please do not hesitate to contact me with questions.  Althea Charon, FNP [facility]  I have provided oversight concerning evaluation and treatment of this patient's health issues addressed during today's encounter. I agree with the assessment and therapeutic plan as outlined in the note.   Signed,   Jiles Prows, MD,  Allergy and Immunology,  [facility].

## 2020-11-15 ENCOUNTER — Ambulatory Visit (HOSPITAL_COMMUNITY)
Admission: RE | Admit: 2020-11-15 | Discharge: 2020-11-15 | Disposition: A | Discharge status: Home / Self Care | Payer: Medicare HMO | Source: Ambulatory Visit | Attending: Sports Medicine | Admitting: Sports Medicine

## 2020-11-15 DIAGNOSIS — M545 Low back pain, unspecified: Secondary | ICD-10-CM | POA: Diagnosis not present

## 2020-11-15 DIAGNOSIS — M5441 Lumbago with sciatica, right side: Secondary | ICD-10-CM | POA: Diagnosis not present

## 2020-11-15 DIAGNOSIS — M5126 Other intervertebral disc displacement, lumbar region: Secondary | ICD-10-CM | POA: Diagnosis not present

## 2020-11-16 ENCOUNTER — Other Ambulatory Visit: Payer: Self-pay

## 2020-11-16 MED ORDER — BUDESONIDE 0.5 MG/2ML IN SUSP: RESPIRATORY_TRACT | 1 refills | Status: DC

## 2020-11-17 ENCOUNTER — Other Ambulatory Visit: Payer: Self-pay

## 2020-11-24 ENCOUNTER — Other Ambulatory Visit: Payer: Self-pay

## 2020-11-24 ENCOUNTER — Ambulatory Visit (INDEPENDENT_AMBULATORY_CARE_PROVIDER_SITE_OTHER): Payer: Medicare HMO

## 2020-11-24 DIAGNOSIS — J4550 Severe persistent asthma, uncomplicated: Secondary | ICD-10-CM | POA: Diagnosis not present

## 2020-11-24 MED ORDER — BENRALIZUMAB 30 MG/ML ~~LOC~~ SOSY
30.0000 mg | PREFILLED_SYRINGE | SUBCUTANEOUS | Status: AC
  Administered 2020-11-24 – 2024-04-30 (×23): 30 mg via SUBCUTANEOUS

## 2020-11-30 DIAGNOSIS — M4727 Other spondylosis with radiculopathy, lumbosacral region: Secondary | ICD-10-CM | POA: Diagnosis not present

## 2020-12-05 DIAGNOSIS — M5126 Other intervertebral disc displacement, lumbar region: Secondary | ICD-10-CM | POA: Diagnosis not present

## 2020-12-06 ENCOUNTER — Other Ambulatory Visit: Payer: Self-pay | Admitting: Allergy & Immunology

## 2020-12-10 DIAGNOSIS — I10 Essential (primary) hypertension: Secondary | ICD-10-CM | POA: Diagnosis not present

## 2020-12-10 DIAGNOSIS — E119 Type 2 diabetes mellitus without complications: Secondary | ICD-10-CM | POA: Diagnosis not present

## 2020-12-16 DIAGNOSIS — D839 Common variable immunodeficiency, unspecified: Secondary | ICD-10-CM | POA: Diagnosis not present

## 2020-12-20 DIAGNOSIS — M25552 Pain in left hip: Secondary | ICD-10-CM | POA: Diagnosis not present

## 2020-12-20 DIAGNOSIS — M25551 Pain in right hip: Secondary | ICD-10-CM | POA: Diagnosis not present

## 2020-12-25 DIAGNOSIS — M25559 Pain in unspecified hip: Secondary | ICD-10-CM | POA: Diagnosis not present

## 2021-01-03 DIAGNOSIS — E1159 Type 2 diabetes mellitus with other circulatory complications: Secondary | ICD-10-CM | POA: Diagnosis not present

## 2021-01-04 DIAGNOSIS — M25551 Pain in right hip: Secondary | ICD-10-CM | POA: Diagnosis not present

## 2021-01-05 ENCOUNTER — Other Ambulatory Visit: Payer: Self-pay

## 2021-01-05 ENCOUNTER — Ambulatory Visit (INDEPENDENT_AMBULATORY_CARE_PROVIDER_SITE_OTHER): Payer: Medicare HMO | Admitting: Allergy & Immunology

## 2021-01-05 VITALS — BP 122/58 | HR 91 | Temp 98.2°F | Resp 18 | Ht 62.0 in | Wt 153.8 lb

## 2021-01-05 DIAGNOSIS — D801 Nonfamilial hypogammaglobulinemia: Secondary | ICD-10-CM | POA: Diagnosis not present

## 2021-01-05 DIAGNOSIS — D808 Other immunodeficiencies with predominantly antibody defects: Secondary | ICD-10-CM

## 2021-01-05 DIAGNOSIS — J4550 Severe persistent asthma, uncomplicated: Secondary | ICD-10-CM | POA: Diagnosis not present

## 2021-01-05 DIAGNOSIS — J310 Chronic rhinitis: Secondary | ICD-10-CM | POA: Diagnosis not present

## 2021-01-05 MED ORDER — BREZTRI AEROSPHERE 160-9-4.8 MCG/ACT IN AERO: INHALATION_SPRAY | RESPIRATORY_TRACT | 5 refills | Status: DC

## 2021-01-05 MED ORDER — IPRATROPIUM-ALBUTEROL 0.5-2.5 (3) MG/3ML IN SOLN
3.0000 mL | Freq: Three times a day (TID) | RESPIRATORY_TRACT | 1 refills | Status: DC
Start: 2021-01-05 — End: 2021-01-08

## 2021-01-05 MED ORDER — BUDESONIDE 0.5 MG/2ML IN SUSP: RESPIRATORY_TRACT | 1 refills | Status: DC

## 2021-01-05 MED ORDER — IPRATROPIUM BROMIDE 0.03 % NA SOLN: NASAL | 3 refills | Status: DC

## 2021-01-05 MED ORDER — VENTOLIN HFA 108 (90 BASE) MCG/ACT IN AERS: 2.0000 | INHALATION_SPRAY | RESPIRATORY_TRACT | 1 refills | Status: DC | PRN
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
## 2021-01-05 NOTE — Patient Instructions (Addendum)
1. 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   - Let's stop the Singulair (montelukast) for now.  - Daily controller medication(s): Breztri two puffs twice daily + Fasenra every 8 weeks + DuoNeb 1-2 times daily - Rescue medications: ProAir 4 puffs every 4-6 hours as needed or DuoNeb nebulizer one vial every 4-6 hours as needed  - Changes during respiratory flares: Add on Pulmicort (budesonide) 0.5 mg twice daily via nebulizer for 1-2 weeks - Asthma control goals:  * Full participation in all desired activities (may need albuterol before activity) * Albuterol use two time or less a week on average (not counting use with activity) * Cough interfering with sleep two time or less a month * Oral steroids no more than once a year * No hospitalizations     2. Chronic non-allergic rhinitis - Continue with Benadryl '25mg'$  at night.   3. 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  - Continue with all of the premedications, including hydration.   4. Return in about 3 months (around 04/07/2021).    Please inform us of any Emergency Department visits, hospitalizations, or changes in symptoms. Call us before going to the ED for breathing or allergy symptoms since we might be able to fit you in for a sick visit. Feel free to contact us anytime with any questions, problems, or concerns.  It was a pleasure to see you again today!  Websites that have reliable patient information: 1. American Academy of Asthma, Allergy, and Immunology: www.aaaai.org 2. Food Allergy Research and Education (FARE): foodallergy.org 3. Mothers of Asthmatics: http://www.asthmacommunitynetwork.org 4. American College of Allergy, Asthma, and Immunology: www.acaai.org   COVID-19 Vaccine Information can be found at: ShippingScam.co.uk For questions related to vaccine distribution or  appointments, please email [email or web address] .[email or web address] or call [phone].   We realize that you might be concerned about having an allergic reaction to the COVID19 vaccines. To help with that concern, WE ARE OFFERING THE COVID19 VACCINES IN OUR OFFICE! Ask the front desk for dates!     "Like" [place] on Facebook and Instagram for our latest updates!      A healthy democracy works best when [employer] participate! Make sure you are registered to vote! If you have moved or changed any of your contact information, you will need to get this updated before voting!  In some cases, you MAY be able to register to vote online: [email or web address]

## 2021-01-08 ENCOUNTER — Other Ambulatory Visit: Payer: Self-pay | Admitting: *Deleted

## 2021-01-08 MED ORDER — IPRATROPIUM-ALBUTEROL 0.5-2.5 (3) MG/3ML IN SOLN: 3.0000 mL | RESPIRATORY_TRACT | 1 refills | Status: DC | PRN

## 2021-01-10 DIAGNOSIS — I10 Essential (primary) hypertension: Secondary | ICD-10-CM | POA: Diagnosis not present

## 2021-01-10 DIAGNOSIS — B370 Candidal stomatitis: Secondary | ICD-10-CM | POA: Diagnosis not present

## 2021-01-10 DIAGNOSIS — R7309 Other abnormal glucose: Secondary | ICD-10-CM | POA: Diagnosis not present

## 2021-01-10 DIAGNOSIS — E1100 Type 2 diabetes mellitus with hyperosmolarity without nonketotic hyperglycemic-hyperosmolar coma (NKHHC): Secondary | ICD-10-CM | POA: Diagnosis not present

## 2021-01-13 DIAGNOSIS — D839 Common variable immunodeficiency, unspecified: Secondary | ICD-10-CM | POA: Diagnosis not present

## 2021-01-16 ENCOUNTER — Other Ambulatory Visit: Payer: Self-pay

## 2021-01-16 ENCOUNTER — Encounter: Payer: Self-pay | Admitting: Pulmonary Disease

## 2021-01-16 ENCOUNTER — Ambulatory Visit: Payer: Medicare HMO | Admitting: Pulmonary Disease

## 2021-01-16 DIAGNOSIS — J4550 Severe persistent asthma, uncomplicated: Secondary | ICD-10-CM

## 2021-01-16 NOTE — Assessment & Plan Note (Signed)
Would continue her regimen of Breztri, Singulair and Benadryl. Okay to discontinue budesonide nebs and use only during a flare.  I think this is contributing to thrush.  Ritta Slot seems to have resolved now. We discussed signs and symptoms of exacerbation and she will call as needed. She will continue on Fasenra every 8 weeks for eosinophilic asthma

## 2021-01-16 NOTE — Patient Instructions (Signed)
OK to use budesonide only during flares  Continue on breztri & duonebs as needed

## 2021-01-16 NOTE — Progress Notes (Signed)
   Subjective:    Patient ID: Kathy Tran, female    DOB: 09-29-1955, 65 y.o.   MRN: [id number]  HPI  65 year old never smoker  for FU of adult onset asthma,  She sees Dr. 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   There is no childhood history of asthma. Symptom onset was in 2018 after an attack of the flu.  She reports an attack of bronchitis in 2016 Repeated ear infections as a child requiring multiple ear tubes through 1990s and 2000, sinus surgery 1998 Hospitalization 07/2016 for bronchospasm , remained on prednisone for 3 months IgG  398  09/2016 , given IVIG Bronchoscopy 10/2016, 01/2017 for mucous plugs Allergy consultation 2018 >> maintained on IV IgG every 4 weeks - startde 03/2017 , Nucala every 4 weeks and Trelegy, prednisone tapered 05/2017   [facility] pulmonary consultation 07/2017 was reviewed >> felt that chronic prednisone use caused hypogammaglobulinemia   However when IVIG was stopped for 3 months her symptoms returned and she has been maintained on IVIG infusions since. Repeat IgG testing showed normal levels of 794 09/2018 and 838 and 07/2018     Chief Complaint  Patient presents with   Follow-up    Feels that asthma control is fair worsens with weather changes.    Initial OV 09/2019 She has been maintained on a regimen of Breztri, Singulair and DuoNebs.  Benadryl for allergies, other antihistaminics did not work. She is also been maintained on budesonide nebs twice daily for many months. She is on Fasenra every 8 weeks and immunoglobulin infusions every 4 weeks. She had COVID infection January 2022 She had multiple episodes of hoarseness and was treated for thrush with nystatin and now with fluconazole.  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 which required intra-articular steroids this is caused hyperglycemia ACT score is 12  Significant tests/ events reviewed  CT angiogram chest 09/2016 nodular  opacities in the right lung largest 8 x 6 mm subpleural right upper lobe   PFTs 01/2017 moderate airway obstruction with ratio 68, FEV1 1.52/63%, FVC 71%, no bronchodilator response, TLC 90% and DLCO 79%   Serial spirometry 2019 and 2020 shows no airway obstruction with FEV1 ranging from 65% to 113%   01/2017 RAST neg, IgE <2 AEC 09/2018 100  Review of Systems neg for any significant sore throat, dysphagia, itching, sneezing, nasal congestion or excess/ purulent secretions, fever, chills, sweats, unintended wt loss, pleuritic or exertional cp, hempoptysis, orthopnea pnd or change in chronic leg swelling. Also denies presyncope, palpitations, heartburn, abdominal pain, nausea, vomiting, diarrhea or change in bowel or urinary habits, dysuria,hematuria, rash, arthralgias, visual complaints, headache, numbness weakness or ataxia.     Objective:   Physical Exam  Gen. Pleasant, well-nourished, in no distress ENT - no thrush, no pallor/icterus,no post nasal drip Neck: No JVD, no thyromegaly, no carotid bruits Lungs: no use of accessory muscles, no dullness to percussion, clear without rales or rhonchi  Cardiovascular: Rhythm regular, heart sounds  normal, no murmurs or gallops, no peripheral edema Musculoskeletal: No deformities, no cyanosis or clubbing        Assessment & Plan:

## 2021-01-19 ENCOUNTER — Other Ambulatory Visit: Payer: Self-pay

## 2021-01-19 ENCOUNTER — Ambulatory Visit: Payer: Self-pay

## 2021-01-19 ENCOUNTER — Ambulatory Visit (INDEPENDENT_AMBULATORY_CARE_PROVIDER_SITE_OTHER): Payer: Medicare HMO

## 2021-01-19 DIAGNOSIS — M25551 Pain in right hip: Secondary | ICD-10-CM | POA: Diagnosis not present

## 2021-01-19 DIAGNOSIS — J4550 Severe persistent asthma, uncomplicated: Secondary | ICD-10-CM | POA: Diagnosis not present

## 2021-01-25 ENCOUNTER — Telehealth: Payer: Self-pay | Admitting: Pulmonary Disease

## 2021-01-25 NOTE — Telephone Encounter (Signed)
Fax received from Dr. Edmonia Lynch to perform a Right total hip replacement on patient.  Patient needs surgery clearance. Patient was just seen 01/16/21. Office protocol is a risk assessment can be sent to surgeon if patient has been seen in 60 days or less.   Sending to Dr. Elsworth Soho for risk assessment or recommendations and to addend LOV notes if she is cleared

## 2021-01-26 NOTE — Telephone Encounter (Signed)
Dr. Elsworth Soho please addend your last office note to say  Cleared for surgery from pulmonary standpoint with usual precautions for this kind of surgery   We have to have the risk assessment in your office notes before I can fax it. Thank you

## 2021-01-29 DIAGNOSIS — N6081 Other benign mammary dysplasias of right breast: Secondary | ICD-10-CM | POA: Diagnosis not present

## 2021-01-30 NOTE — Telephone Encounter (Signed)
OV notes and phone notes faxed to [facility]. Nothing further needed at this time.    Rigoberto Noel, MD  You 22 hours ago (1:27 PM)   Please send OV note with phone note  I cannot addend since it was not an issue durign OV

## 2021-02-13 ENCOUNTER — Other Ambulatory Visit: Payer: Self-pay | Admitting: Allergy & Immunology

## 2021-02-14 ENCOUNTER — Other Ambulatory Visit (HOSPITAL_COMMUNITY): Payer: Self-pay | Admitting: Internal Medicine

## 2021-02-15 ENCOUNTER — Other Ambulatory Visit (HOSPITAL_COMMUNITY): Payer: Self-pay | Admitting: Internal Medicine

## 2021-02-15 DIAGNOSIS — N6002 Solitary cyst of left breast: Secondary | ICD-10-CM

## 2021-02-17 DIAGNOSIS — D839 Common variable immunodeficiency, unspecified: Secondary | ICD-10-CM | POA: Diagnosis not present

## 2021-03-09 ENCOUNTER — Ambulatory Visit: Payer: Medicare HMO | Admitting: Allergy & Immunology

## 2021-03-13 ENCOUNTER — Ambulatory Visit (HOSPITAL_COMMUNITY)
Admission: RE | Admit: 2021-03-13 | Discharge: 2021-03-13 | Disposition: A | Discharge status: Home / Self Care | Payer: Medicare HMO | Source: Ambulatory Visit | Attending: Internal Medicine | Admitting: Internal Medicine

## 2021-03-13 ENCOUNTER — Other Ambulatory Visit: Payer: Self-pay

## 2021-03-13 ENCOUNTER — Encounter (HOSPITAL_COMMUNITY): Payer: Self-pay

## 2021-03-13 DIAGNOSIS — N6002 Solitary cyst of left breast: Secondary | ICD-10-CM | POA: Insufficient documentation

## 2021-03-13 DIAGNOSIS — R922 Inconclusive mammogram: Secondary | ICD-10-CM | POA: Diagnosis not present

## 2021-03-13 DIAGNOSIS — N6324 Unspecified lump in the left breast, lower inner quadrant: Secondary | ICD-10-CM | POA: Diagnosis not present

## 2021-03-16 ENCOUNTER — Ambulatory Visit (INDEPENDENT_AMBULATORY_CARE_PROVIDER_SITE_OTHER): Payer: Medicare HMO

## 2021-03-16 ENCOUNTER — Other Ambulatory Visit: Payer: Self-pay

## 2021-03-16 DIAGNOSIS — J4550 Severe persistent asthma, uncomplicated: Secondary | ICD-10-CM

## 2021-03-17 DIAGNOSIS — D839 Common variable immunodeficiency, unspecified: Secondary | ICD-10-CM | POA: Diagnosis not present

## 2021-04-04 DIAGNOSIS — M1990 Unspecified osteoarthritis, unspecified site: Secondary | ICD-10-CM | POA: Diagnosis not present

## 2021-04-04 DIAGNOSIS — Z79899 Other long term (current) drug therapy: Secondary | ICD-10-CM | POA: Diagnosis not present

## 2021-04-04 DIAGNOSIS — E1159 Type 2 diabetes mellitus with other circulatory complications: Secondary | ICD-10-CM | POA: Diagnosis not present

## 2021-04-04 DIAGNOSIS — I10 Essential (primary) hypertension: Secondary | ICD-10-CM | POA: Diagnosis not present

## 2021-04-10 DIAGNOSIS — J4552 Severe persistent asthma with status asthmaticus: Secondary | ICD-10-CM | POA: Diagnosis not present

## 2021-04-10 DIAGNOSIS — Z23 Encounter for immunization: Secondary | ICD-10-CM | POA: Diagnosis not present

## 2021-04-10 DIAGNOSIS — E785 Hyperlipidemia, unspecified: Secondary | ICD-10-CM | POA: Diagnosis not present

## 2021-04-10 DIAGNOSIS — Z0000 Encounter for general adult medical examination without abnormal findings: Secondary | ICD-10-CM | POA: Diagnosis not present

## 2021-04-10 DIAGNOSIS — Z6829 Body mass index (BMI) 29.0-29.9, adult: Secondary | ICD-10-CM | POA: Diagnosis not present

## 2021-04-10 DIAGNOSIS — E1100 Type 2 diabetes mellitus with hyperosmolarity without nonketotic hyperglycemic-hyperosmolar coma (NKHHC): Secondary | ICD-10-CM | POA: Diagnosis not present

## 2021-04-10 DIAGNOSIS — M1611 Unilateral primary osteoarthritis, right hip: Secondary | ICD-10-CM | POA: Diagnosis not present

## 2021-04-10 DIAGNOSIS — I10 Essential (primary) hypertension: Secondary | ICD-10-CM | POA: Diagnosis not present

## 2021-04-13 ENCOUNTER — Ambulatory Visit (INDEPENDENT_AMBULATORY_CARE_PROVIDER_SITE_OTHER): Payer: Medicare HMO | Admitting: Allergy & Immunology

## 2021-04-13 ENCOUNTER — Encounter: Payer: Self-pay | Admitting: Allergy & Immunology

## 2021-04-13 ENCOUNTER — Other Ambulatory Visit: Payer: Self-pay

## 2021-04-13 VITALS — BP 116/60 | HR 92 | Temp 97.4°F | Resp 16 | Ht 61.0 in | Wt 157.6 lb

## 2021-04-13 DIAGNOSIS — J310 Chronic rhinitis: Secondary | ICD-10-CM

## 2021-04-13 DIAGNOSIS — J4550 Severe persistent asthma, uncomplicated: Secondary | ICD-10-CM

## 2021-04-13 DIAGNOSIS — D801 Nonfamilial hypogammaglobulinemia: Secondary | ICD-10-CM | POA: Diagnosis not present

## 2021-04-13 NOTE — Progress Notes (Signed)
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  - Daily controller medication(s): Breztri two puffs twice daily + Fasenra every 8 weeks - Rescue medications: ProAir 4 puffs every 4-6 hours as needed or DuoNeb nebulizer one vial every 4-6 hours as needed  - Changes during respiratory flares: Add on Pulmicort (budesonide) 0.5 mg twice daily via nebulizer for 1-2 weeks - Asthma control goals:  * Full participation in all desired activities (may need albuterol before activity) * Albuterol use two time or less a week on average (not counting use with activity) * Cough interfering with sleep two time or less a month * Oral steroids no more than once a year * No hospitalizations     2. Chronic non-allergic rhinitis - Continue with Benadryl 25mg  at night.   3. 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  - Continue with all of the premedications, including hydration.  - Get some labs today since you are getting your infusion tomorrow.   4. Return in about 6 months (around 10/12/2021).    Subjective:   Kathy Tran is a 65 y.o. female presenting today for follow up of  Chief Complaint  Patient presents with   Asthma    Patient says she's had no issues with her asthma.    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 ([place]) 02/27/2018   Hx of colonic polyps 01/07/2018   Postmenopausal 07/02/2017   Hypogammaglobulinemia ([place]) 04/29/2017    Recurrent infections 04/29/2017   Non-allergic rhinitis 04/29/2017   Severe persistent asthma without complication 00/92/3300   Essential hypertension, benign 03/20/2017   Asthma exacerbation 12/26/2016   Bronchitis 09/25/2016   Asthmatic bronchitis 07/18/2016   DM type 2 causing vascular disease ([place]) 07/18/2016   GERD (gastroesophageal reflux disease) 07/18/2016   Mixed hyperlipidemia 07/18/2016   History of colonic polyps 06/12/2016    History obtained from: chart review and patient.  Kathy Tran was referred by Asencion Noble, MD.     Kathy Tran is a 65 y.o. female presenting for a follow up visit.  She was last seen in August 2022.  At that time, we did not get a test because the spirometry machine was not working.  We stopped her Singulair because she wanted to simplify her regimen. We continued with Breztri two puffs BID as well as DuoNeb BID. She has Pulmicort that she adds during flares. For her rhinitis, we continued with Benadryl at night. Her infusions were going well with IVIG monthly. She was not having any breakthrough infections.  Since the last visit, she has largely done well.    Asthma/Respiratory Symptom History: She remains on the Breztri two puffs twice daily. She is using her Berna Bue regularly. Breathing treatments last were used in August. She was using her albuterol fairly regularly but this has not been the case over the last several months. She has not needed steroids in quite some time. She has not needed prednisone in years. She is  sleeping well at night without coughing or wheezing.    Allergic Rhinitis Symptom History: She has been using her Benadryl at night and this has been working well to control her symptoms. She has not needed antibiotics at all.   She is doing very well with her infusions. She gets this monthly. She has not been hospitalized in over 3 years. Last antibiotics were months ago. This was July 2022. She was on Augmentin twice daily for 3 weeks at that  time. She is very happy with how well she is doing.    She is having a right hip replacement in January 2023. She is very ready for it. She has been doing Tramadol multiple times per day lately for this pain. She is having some left hip pain from compensating and she is hoping that this gets better once she is able to walk normal.   Otherwise, there is no history of other atopic diseases, including food allergies, drug allergies, stinging insect allergies, eczema, urticaria, or contact dermatitis. There is no significant infectious history. Vaccinations are up to date.   Otherwise, there have been no changes to her past medical history, surgical history, family history, or social history.    Review of Systems  Constitutional: Negative.  Negative for fever, malaise/fatigue and weight loss.  HENT: Negative.  Negative for congestion, ear discharge and ear pain.   Eyes:  Negative for pain, discharge and redness.  Respiratory:  Negative for cough, sputum production, shortness of breath and wheezing.   Cardiovascular: Negative.  Negative for chest pain and palpitations.  Gastrointestinal:  Negative for abdominal pain, heartburn, nausea and vomiting.  Musculoskeletal:  Positive for joint pain. Negative for falls.  Skin: Negative.  Negative for itching and rash.  Neurological:  Negative for dizziness and headaches.  Endo/Heme/Allergies:  Negative for environmental allergies. Does not bruise/bleed easily.      Objective:   Blood pressure 116/60, pulse 92, temperature (!) 97.4 F (36.3 C), temperature source Temporal, resp. rate 16, height 5\' 1"  (1.549 m), weight 157 lb 9.6 oz (71.5 kg), SpO2 96 %. Body mass index is 29.78 kg/m.   Physical Exam:  Physical Exam Vitals reviewed.  Constitutional:      Appearance: She is well-developed.     Comments: Very pleasant and talkative.   HENT:     Head: Normocephalic and atraumatic.     Right Ear: Tympanic membrane, ear canal and external ear  normal.     Left Ear: Tympanic membrane, ear canal and external ear normal.     Nose: No nasal deformity, septal deviation, mucosal edema or rhinorrhea.     Right Turbinates: Enlarged, swollen and pale.     Left Turbinates: Enlarged, swollen and pale.     Right Sinus: No maxillary sinus tenderness or frontal sinus tenderness.     Left Sinus: No maxillary sinus tenderness or frontal sinus tenderness.     Mouth/Throat:     Mouth: Mucous membranes are not pale and not dry.     Pharynx: Uvula midline.  Eyes:     General: Lids are normal. No allergic shiner.       Right eye: No discharge.        Left eye: No discharge.     Conjunctiva/sclera: Conjunctivae normal.     Right eye: Right conjunctiva is not injected. No chemosis.    Left eye: Left conjunctiva is not injected. No chemosis.    Pupils: Pupils are equal, round, and reactive to light.  Cardiovascular:  Rate and Rhythm: Normal rate and regular rhythm.     Heart sounds: Normal heart sounds.  Pulmonary:     Effort: Pulmonary effort is normal. No tachypnea, accessory muscle usage or respiratory distress.     Breath sounds: Normal breath sounds. No wheezing, rhonchi or rales.     Comments: Air well in all lung fields. Chest:     Chest wall: No tenderness.  Lymphadenopathy:     Cervical: No cervical adenopathy.  Skin:    General: Skin is warm.     Capillary Refill: Capillary refill takes less than 2 seconds.     Coloration: Skin is not pale.     Findings: No abrasion, erythema, petechiae or rash. Rash is not papular, urticarial or vesicular.     Comments: No eczematous or urticarial lesions noted.  Neurological:     Mental Status: She is alert.  Psychiatric:        Behavior: Behavior is cooperative.     Diagnostic studies:    Spirometry: results normal (FEV1: 1.71/78%, FVC: 2.19/79%, FEV1/FVC: 78%).    Spirometry consistent with normal pattern.   Allergy Studies: none        Salvatore Marvel, MD  [facility]

## 2021-04-13 NOTE — Addendum Note (Signed)
Addended by: Tommas Olp B on: 04/13/2021 04:17 PM   Modules accepted: Orders

## 2021-04-13 NOTE — Patient Instructions (Addendum)
1. 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  - Daily controller medication(s): Breztri two puffs twice daily + Fasenra every 8 weeks - Rescue medications: ProAir 4 puffs every 4-6 hours as needed or DuoNeb nebulizer one vial every 4-6 hours as needed  - Changes during respiratory flares: Add on Pulmicort (budesonide) 0.5 mg twice daily via nebulizer for 1-2 weeks - Asthma control goals:  * Full participation in all desired activities (may need albuterol before activity) * Albuterol use two time or less a week on average (not counting use with activity) * Cough interfering with sleep two time or less a month * Oral steroids no more than once a year * No hospitalizations     2. Chronic non-allergic rhinitis - Continue with Benadryl 25mg  at night.   3. 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  - Continue with all of the premedications, including hydration.  - Get some labs today since you are getting your infusion tomorrow.   4. Return in about 6 months (around 10/12/2021).    Please inform us of any Emergency Department visits, hospitalizations, or changes in symptoms. Call us before going to the ED for breathing or allergy symptoms since we might be able to fit you in for a sick visit. Feel free to contact us anytime with any questions, problems, or concerns.  It was a pleasure to see you again today!  Websites that have reliable patient information: 1. American Academy of Asthma, Allergy, and Immunology: www.aaaai.org 2. Food Allergy Research and Education (FARE): foodallergy.org 3. Mothers of Asthmatics: http://www.asthmacommunitynetwork.org 4. American College of Allergy, Asthma, and Immunology: www.acaai.org   COVID-19 Vaccine Information can be found at: ShippingScam.co.uk For questions related to vaccine distribution or appointments, please  email vaccine@ [email or web address] or call [phone].   We realize that you might be concerned about having an allergic reaction to the COVID19 vaccines. To help with that concern, WE ARE OFFERING THE COVID19 VACCINES IN OUR OFFICE! Ask the front desk for dates!     "Like" [place] on Facebook and Instagram for our latest updates!      A healthy democracy works best when [employer] participate! Make sure you are registered to vote! If you have moved or changed any of your contact information, you will need to get this updated before voting!  In some cases, you MAY be able to register to vote online: [email or web address]

## 2021-04-14 DIAGNOSIS — D839 Common variable immunodeficiency, unspecified: Secondary | ICD-10-CM | POA: Diagnosis not present

## 2021-04-14 LAB — CMP14+EGFR
ALT: 16 IU/L (ref 0–32)
AST: 17 IU/L (ref 0–40)
Albumin/Globulin Ratio: 2.2 (ref 1.2–2.2)
Albumin: 4.4 g/dL (ref 3.8–4.8)
Alkaline Phosphatase: 70 IU/L (ref 44–121)
BUN/Creatinine Ratio: 21 (ref 12–28)
BUN: 16 mg/dL (ref 8–27)
Bilirubin Total: 0.3 mg/dL (ref 0.0–1.2)
CO2: 26 mmol/L (ref 20–29)
Calcium: 9.7 mg/dL (ref 8.7–10.3)
Chloride: 102 mmol/L (ref 96–106)
Creatinine, Ser: 0.78 mg/dL (ref 0.57–1.00)
Globulin, Total: 2 g/dL (ref 1.5–4.5)
Glucose: 185 mg/dL — ABNORMAL HIGH (ref 70–99)
Potassium: 4.8 mmol/L (ref 3.5–5.2)
Sodium: 142 mmol/L (ref 134–144)
Total Protein: 6.4 g/dL (ref 6.0–8.5)
eGFR: 85 mL/min/{1.73_m2} (ref >59)

## 2021-04-14 LAB — CBC WITH DIFFERENTIAL
Basophils Absolute: 0 10*3/uL (ref 0.0–0.2)
Basos: 0 %
EOS (ABSOLUTE): 0 10*3/uL (ref 0.0–0.4)
Eos: 0 %
Hematocrit: 38.1 % (ref 34.0–46.6)
Hemoglobin: 12.7 g/dL (ref 11.1–15.9)
Immature Grans (Abs): 0 10*3/uL (ref 0.0–0.1)
Immature Granulocytes: 0 %
Lymphocytes Absolute: 1.3 10*3/uL (ref 0.7–3.1)
Lymphs: 27 %
MCH: 31.6 pg (ref 26.6–33.0)
MCHC: 33.3 g/dL (ref 31.5–35.7)
MCV: 95 fL (ref 79–97)
Monocytes Absolute: 0.3 10*3/uL (ref 0.1–0.9)
Monocytes: 6 %
Neutrophils Absolute: 3.3 10*3/uL (ref 1.4–7.0)
Neutrophils: 67 %
RBC: 4.02 x10E6/uL (ref 3.77–5.28)
RDW: 11.7 % (ref 11.7–15.4)
WBC: 4.9 10*3/uL (ref 3.4–10.8)

## 2021-04-14 LAB — IGG: IgG (Immunoglobin G), Serum: 862 mg/dL (ref 586–1602)

## 2021-04-23 DIAGNOSIS — M25551 Pain in right hip: Secondary | ICD-10-CM | POA: Diagnosis not present

## 2021-05-09 ENCOUNTER — Ambulatory Visit (INDEPENDENT_AMBULATORY_CARE_PROVIDER_SITE_OTHER): Payer: Medicare HMO | Admitting: *Deleted

## 2021-05-09 ENCOUNTER — Other Ambulatory Visit: Payer: Self-pay

## 2021-05-09 DIAGNOSIS — J4550 Severe persistent asthma, uncomplicated: Secondary | ICD-10-CM | POA: Diagnosis not present

## 2021-05-09 NOTE — Patient Instructions (Signed)
DUE TO COVID-19 ONLY ONE VISITOR IS ALLOWED TO COME WITH YOU AND STAY IN THE WAITING ROOM ONLY DURING PRE OP AND PROCEDURE.   **NO VISITORS ARE ALLOWED IN THE SHORT STAY AREA OR RECOVERY ROOM!!**  IF YOU WILL BE ADMITTED INTO THE HOSPITAL YOU ARE ALLOWED ONLY TWO SUPPORT PEOPLE DURING VISITATION HOURS ONLY (7 AM -8PM)   The support person(s) must pass our screening, gel in and out, and wear a mask at all times, including in the patients room. Patients must also wear a mask when staff or their support person are in the room. Visitors GUEST BADGE MUST BE WORN VISIBLY  One adult visitor may remain with you overnight and MUST be in the room by 8 P.M.  No visitors under the age of 1. Any visitor under the age of 75 must be accompanied by an adult.  COVID SWAB TESTING MUST BE COMPLETED ON:  05/18/21 @ 9:30 AM at [facility] in through main entrance. Be sit in the lobby area to the right as you come in the main entrance. Dial [phone] give them your name and let them know you are here for COVID testing.      Your procedure is scheduled on: 05/22/21   Report to [facility] Main Entrance    Report to admitting at : 7:15 AM   Call this number if you have problems the morning of surgery [phone]   Do not eat food :After Midnight.   May have liquids until : 7:00 AM   day of surgery  CLEAR LIQUID DIET  Foods Allowed                                                                     Foods Excluded  Water, Black Coffee and tea, regular and decaf                             liquids that you cannot  Plain Jell-O in any flavor  (No red)                                           see through such as: Fruit ices (not with fruit pulp)                                     milk, soups, orange juice              Iced Popsicles (No red)                                    All solid food                                   Apple juices Sports drinks like Gatorade (No red) Lightly  seasoned clear broth or consume(fat free) Sugar  Sample Menu Breakfast  Lunch                                     Supper Cranberry juice                    Beef broth                            Chicken broth Jell-O                                     Grape juice                           Apple juice Coffee or tea                        Jell-O                                      Popsicle                                                Coffee or tea                        Coffee or tea      Complete one Gatorade drink the morning of surgery at: 7:00 AM       the day of surgery.    The day of surgery:  Drink ONE (1) Pre-Surgery Clear Ensure or G2 by am the morning of surgery. Drink in one sitting. Do not sip.  This drink was given to you during your hospital  pre-op appointment visit. Nothing else to drink after completing the  Pre-Surgery Clear Ensure or G2.          If you have questions, please contact your surgeons office.     Oral Hygiene is also important to reduce your risk of infection.                                    Remember - BRUSH YOUR TEETH THE MORNING OF SURGERY WITH YOUR REGULAR TOOTHPASTE   Do NOT smoke after Midnight   Take these medicines the morning of surgery with A SIP OF WATER: gabapentin,carvedilol,pantoprazole.Use inhalers as usual.  How to Manage Your Diabetes Before and After Surgery  Why is it important to control my blood sugar before and after surgery? Improving blood sugar levels before and after surgery helps healing and can limit problems. A way of improving blood sugar control is eating a healthy diet by:  Eating less sugar and carbohydrates  Increasing activity/exercise  Talking with your doctor about reaching your blood sugar goals High blood sugars (greater than 180 mg/dL) can raise your risk of infections and slow your recovery, so you will need to focus on controlling your diabetes during the weeks before  surgery. Make sure that the doctor who takes care of your  diabetes knows about your planned surgery including the date and location.  How do I manage my blood sugar before surgery? Check your blood sugar at least 4 times a day, starting 2 days before surgery, to make sure that the level is not too high or low. Check your blood sugar the morning of your surgery when you wake up and every 2 hours until you get to the Short Stay unit. If your blood sugar is less than 70 mg/dL, you will need to treat for low blood sugar: Do not take insulin. Treat a low blood sugar (less than 70 mg/dL) with  cup of clear juice (cranberry or apple), 4 glucose tablets, OR glucose gel. Recheck blood sugar in 15 minutes after treatment (to make sure it is greater than 70 mg/dL). If your blood sugar is not greater than 70 mg/dL on recheck, call [phone] for further instructions. Report your blood sugar to the short stay nurse when you get to Short Stay.  If you are admitted to the hospital after surgery: Your blood sugar will be checked by the staff and you will probably be given insulin after surgery (instead of oral diabetes medicines) to make sure you have good blood sugar levels. The goal for blood sugar control after surgery is 80-180 mg/dL.   WHAT DO I DO ABOUT MY DIABETES MEDICATION?  Do not take oral diabetes medicines (pills) the morning of surgery.  THE DAY BEFORE SURGERY, take metformin and victosa as usual.     units of       insulin.       THE MORNING OF SURGERY, take normal dose of lantus insulin in the morning and ONLY half at night time.  The day of surgery, do not take other diabetes injectables, including Byetta (exenatide), Bydureon (exenatide ER), Victoza (liraglutide), or Trulicity (dulaglutide). Take ONLY half of the lantus insulin dose.  DO NOT TAKE ANY ORAL DIABETIC MEDICATIONS DAY OF YOUR SURGERY                              You may not have any metal on your body including hair  pins, jewelry, and body piercing             Do not wear make-up, lotions, powders, perfumes/cologne, or deodorant  Do not wear nail polish including gel and S&S, artificial/acrylic nails, or any other type of covering on natural nails including finger and toenails. If you have artificial nails, gel coating, etc. that needs to be removed by a nail salon please have this removed prior to surgery or surgery may need to be canceled/ delayed if the surgeon/ anesthesia feels like they are unable to be safely monitored.   Do not shave  48 hours prior to surgery.    Do not bring valuables to the hospital. [place].   Contacts, dentures or bridgework may not be worn into surgery.   Bring small overnight bag day of surgery.    Patients discharged on the day of surgery will not be allowed to drive home.   Special Instructions: Bring a copy of your healthcare power of attorney and living will documents         the day of surgery if you haven't scanned them before.              Please  read over the following fact sheets you were given: IF YOU HAVE QUESTIONS ABOUT YOUR PRE-OP INSTRUCTIONS PLEASE CALL [phone]     [facility] - Preparing for Surgery Before surgery, you can play an important role.  Because skin is not sterile, your skin needs to be as free of germs as possible.  You can reduce the number of germs on your skin by washing with CHG (chlorahexidine gluconate) soap before surgery.  CHG is an antiseptic cleaner which kills germs and bonds with the skin to continue killing germs even after washing. Please DO NOT use if you have an allergy to CHG or antibacterial soaps.  If your skin becomes reddened/irritated stop using the CHG and inform your nurse when you arrive at Short Stay. Do not shave (including legs and underarms) for at least 48 hours prior to the first CHG shower.  You may shave your face/neck. Please follow these instructions  carefully:  1.  Shower with CHG Soap the night before surgery and the  morning of Surgery.  2.  If you choose to wash your hair, wash your hair first as usual with your  normal  shampoo.  3.  After you shampoo, rinse your hair and body thoroughly to remove the  shampoo.                           4.  Use CHG as you would any other liquid soap.  You can apply chg directly  to the skin and wash                       Gently with a scrungie or clean washcloth.  5.  Apply the CHG Soap to your body ONLY FROM THE NECK DOWN.   Do not use on face/ open                           Wound or open sores. Avoid contact with eyes, ears mouth and genitals (private parts).                       Wash face,  Genitals (private parts) with your normal soap.             6.  Wash thoroughly, paying special attention to the area where your surgery  will be performed.  7.  Thoroughly rinse your body with warm water from the neck down.  8.  DO NOT shower/wash with your normal soap after using and rinsing off  the CHG Soap.                9.  Pat yourself dry with a clean towel.            10.  Wear clean pajamas.            11.  Place clean sheets on your bed the night of your first shower and do not  sleep with pets. Day of Surgery : Do not apply any lotions/deodorants the morning of surgery.  Please wear clean clothes to the hospital/surgery center.  FAILURE TO FOLLOW THESE INSTRUCTIONS MAY RESULT IN THE CANCELLATION OF YOUR SURGERY PATIENT SIGNATURE_________________________________  NURSE SIGNATURE__________________________________  ________________________________________________________________________   Kathy Tran  An incentive spirometer is a tool that can help keep your lungs clear and active. This tool measures how well you are filling your lungs with each breath. Taking  long deep breaths may help reverse or decrease the chance of developing breathing (pulmonary) problems (especially infection)  following: A long period of time when you are unable to move or be active. BEFORE THE PROCEDURE  If the spirometer includes an indicator to show your best effort, your nurse or respiratory therapist will set it to a desired goal. If possible, sit up straight or lean slightly forward. Try not to slouch. Hold the incentive spirometer in an upright position. INSTRUCTIONS FOR USE  Sit on the edge of your bed if possible, or sit up as far as you can in bed or on a chair. Hold the incentive spirometer in an upright position. Breathe out normally. Place the mouthpiece in your mouth and seal your lips tightly around it. Breathe in slowly and as deeply as possible, raising the piston or the ball toward the top of the column. Hold your breath for 3-5 seconds or for as long as possible. Allow the piston or ball to fall to the bottom of the column. Remove the mouthpiece from your mouth and breathe out normally. Rest for a few seconds and repeat Steps 1 through 7 at least 10 times every 1-2 hours when you are awake. Take your time and take a few normal breaths between deep breaths. The spirometer may include an indicator to show your best effort. Use the indicator as a goal to work toward during each repetition. After each set of 10 deep breaths, practice coughing to be sure your lungs are clear. If you have an incision (the cut made at the time of surgery), support your incision when coughing by placing a pillow or rolled up towels firmly against it. Once you are able to get out of bed, walk around indoors and cough well. You may stop using the incentive spirometer when instructed by your caregiver.  RISKS AND COMPLICATIONS Take your time so you do not get dizzy or light-headed. If you are in pain, you may need to take or ask for pain medication before doing incentive spirometry. It is harder to take a deep breath if you are having pain. AFTER USE Rest and breathe slowly and easily. It can be helpful to  keep track of a log of your progress. Your caregiver can provide you with a simple table to help with this. If you are using the spirometer at home, follow these instructions: [place] IF:  You are having difficultly using the spirometer. You have trouble using the spirometer as often as instructed. Your pain medication is not giving enough relief while using the spirometer. You develop fever of 100.5 F (38.1 C) or higher. SEEK IMMEDIATE MEDICAL CARE IF:  You cough up bloody sputum that had not been present before. You develop fever of 102 F (38.9 C) or greater. You develop worsening pain at or near the incision site. MAKE SURE YOU:  Understand these instructions. Will watch your condition. Will get help right away if you are not doing well or get worse. Document Released: 09/09/2006 Document Revised: 07/22/2011 Document Reviewed: 11/10/2006 [facility] Patient Information 2014 [place], [place].   ________________________________________________________________________

## 2021-05-10 ENCOUNTER — Encounter (HOSPITAL_COMMUNITY)
Admission: RE | Admit: 2021-05-10 | Discharge: 2021-05-10 | Disposition: A | Discharge status: Home / Self Care | Payer: Medicare HMO | Source: Ambulatory Visit | Attending: Orthopedic Surgery | Admitting: Orthopedic Surgery

## 2021-05-10 ENCOUNTER — Encounter (HOSPITAL_COMMUNITY): Payer: Self-pay

## 2021-05-10 ENCOUNTER — Other Ambulatory Visit: Payer: Self-pay

## 2021-05-10 VITALS — BP 134/82 | HR 95 | Temp 98.1°F | Resp 16 | Ht 61.5 in | Wt 155.0 lb

## 2021-05-10 DIAGNOSIS — E1159 Type 2 diabetes mellitus with other circulatory complications: Secondary | ICD-10-CM | POA: Diagnosis not present

## 2021-05-10 DIAGNOSIS — Z01818 Encounter for other preprocedural examination: Secondary | ICD-10-CM | POA: Insufficient documentation

## 2021-05-10 DIAGNOSIS — I10 Essential (primary) hypertension: Secondary | ICD-10-CM | POA: Insufficient documentation

## 2021-05-10 LAB — CBC
HCT: 40.4 % (ref 36.0–46.0)
Hemoglobin: 13.2 g/dL (ref 12.0–15.0)
MCH: 31.7 pg (ref 26.0–34.0)
MCHC: 32.7 g/dL (ref 30.0–36.0)
MCV: 97.1 fL (ref 80.0–100.0)
Platelets: 229 10*3/uL (ref 150–400)
RBC: 4.16 MIL/uL (ref 3.87–5.11)
RDW: 12 % (ref 11.5–15.5)
WBC: 5.5 10*3/uL (ref 4.0–10.5)
nRBC: 0 % (ref 0.0–0.2)

## 2021-05-10 LAB — GLUCOSE, CAPILLARY: Glucose-Capillary: 180 mg/dL — ABNORMAL HIGH (ref 70–99)

## 2021-05-10 LAB — SURGICAL PCR SCREEN
MRSA, PCR: NEGATIVE
Staphylococcus aureus: POSITIVE — AB

## 2021-05-10 LAB — BASIC METABOLIC PANEL
Anion gap: 6 (ref 5–15)
BUN: 21 mg/dL (ref 8–23)
CO2: 27 mmol/L (ref 22–32)
Calcium: 9.2 mg/dL (ref 8.9–10.3)
Chloride: 104 mmol/L (ref 98–111)
Creatinine, Ser: 0.8 mg/dL (ref 0.44–1.00)
GFR, Estimated: >60 mL/min (ref >60)
Glucose, Bld: 180 mg/dL — ABNORMAL HIGH (ref 70–99)
Potassium: 4.1 mmol/L (ref 3.5–5.1)
Sodium: 137 mmol/L (ref 135–145)

## 2021-05-10 LAB — HEMOGLOBIN A1C
Hgb A1c MFr Bld: 7.9 % — ABNORMAL HIGH (ref 4.8–5.6)
Mean Plasma Glucose: 180.03 mg/dL

## 2021-05-10 NOTE — Progress Notes (Signed)
COVID Vaccine Completed: Yes Date COVID Vaccine completed: 11/12/19 x 2 COVID vaccine manufacturer:  Levan Hurst    COVID Test: 05/18/20 PCP - Dr. Asencion Noble Cardiologist - NO Pulmonologist: Dr. Rigoberto Noel.: clearance: 01/25/21: EPIC Chest x-ray - 09/26/20 EKG -  Stress Test -  ECHO -  Cardiac Cath -  Pacemaker/ICD device last checked:  Sleep Study -  CPAP -   Fasting Blood Sugar - 99 - 150 Checks Blood Sugar __3___ times a day  Blood Thinner Instructions: Aspirin will be held a week before surgery as per Dr. Percell Miller instructions. Aspirin Instructions: Last Dose:  Anesthesia review: Hx: DIA,HTN,Heart murmur,COPD  Patient denies shortness of breath, fever, cough and chest pain at PAT appointment   Patient verbalized understanding of instructions that were given to them at the PAT appointment. Patient was also instructed that they will need to review over the PAT instructions again at home before surgery.

## 2021-05-12 DIAGNOSIS — D839 Common variable immunodeficiency, unspecified: Secondary | ICD-10-CM | POA: Diagnosis not present

## 2021-05-15 ENCOUNTER — Other Ambulatory Visit: Payer: Self-pay | Admitting: Allergy & Immunology

## 2021-05-18 ENCOUNTER — Encounter (HOSPITAL_COMMUNITY)
Admission: RE | Admit: 2021-05-18 | Discharge: 2021-05-18 | Disposition: A | Discharge status: Home / Self Care | Payer: Medicare HMO | Source: Ambulatory Visit | Attending: Orthopedic Surgery | Admitting: Orthopedic Surgery

## 2021-05-18 DIAGNOSIS — Z01818 Encounter for other preprocedural examination: Secondary | ICD-10-CM

## 2021-05-22 ENCOUNTER — Ambulatory Visit (HOSPITAL_COMMUNITY): Admission: RE | Admit: 2021-05-22 | Payer: Medicare HMO | Source: Home / Self Care | Admitting: Orthopedic Surgery

## 2021-05-22 ENCOUNTER — Encounter (HOSPITAL_COMMUNITY): Admission: RE | Payer: Self-pay | Source: Home / Self Care

## 2021-05-22 SURGERY — ARTHROPLASTY, HIP, TOTAL,POSTERIOR APPROACH
Anesthesia: Choice | Site: Hip | Laterality: Right

## 2021-06-01 DIAGNOSIS — M9905 Segmental and somatic dysfunction of pelvic region: Secondary | ICD-10-CM | POA: Diagnosis not present

## 2021-06-01 DIAGNOSIS — M9902 Segmental and somatic dysfunction of thoracic region: Secondary | ICD-10-CM | POA: Diagnosis not present

## 2021-06-01 DIAGNOSIS — M5441 Lumbago with sciatica, right side: Secondary | ICD-10-CM | POA: Diagnosis not present

## 2021-06-01 DIAGNOSIS — M9903 Segmental and somatic dysfunction of lumbar region: Secondary | ICD-10-CM | POA: Diagnosis not present

## 2021-06-01 DIAGNOSIS — M542 Cervicalgia: Secondary | ICD-10-CM | POA: Diagnosis not present

## 2021-06-01 DIAGNOSIS — M62830 Muscle spasm of back: Secondary | ICD-10-CM | POA: Diagnosis not present

## 2021-06-01 DIAGNOSIS — M546 Pain in thoracic spine: Secondary | ICD-10-CM | POA: Diagnosis not present

## 2021-06-01 DIAGNOSIS — M9901 Segmental and somatic dysfunction of cervical region: Secondary | ICD-10-CM | POA: Diagnosis not present

## 2021-06-09 DIAGNOSIS — D839 Common variable immunodeficiency, unspecified: Secondary | ICD-10-CM | POA: Diagnosis not present

## 2021-06-10 DIAGNOSIS — E119 Type 2 diabetes mellitus without complications: Secondary | ICD-10-CM | POA: Diagnosis not present

## 2021-06-10 DIAGNOSIS — I10 Essential (primary) hypertension: Secondary | ICD-10-CM | POA: Diagnosis not present

## 2021-06-15 DIAGNOSIS — M9905 Segmental and somatic dysfunction of pelvic region: Secondary | ICD-10-CM | POA: Diagnosis not present

## 2021-06-15 DIAGNOSIS — M9902 Segmental and somatic dysfunction of thoracic region: Secondary | ICD-10-CM | POA: Diagnosis not present

## 2021-06-15 DIAGNOSIS — M9903 Segmental and somatic dysfunction of lumbar region: Secondary | ICD-10-CM | POA: Diagnosis not present

## 2021-06-15 DIAGNOSIS — M542 Cervicalgia: Secondary | ICD-10-CM | POA: Diagnosis not present

## 2021-06-15 DIAGNOSIS — M62830 Muscle spasm of back: Secondary | ICD-10-CM | POA: Diagnosis not present

## 2021-06-15 DIAGNOSIS — M5441 Lumbago with sciatica, right side: Secondary | ICD-10-CM | POA: Diagnosis not present

## 2021-06-15 DIAGNOSIS — M546 Pain in thoracic spine: Secondary | ICD-10-CM | POA: Diagnosis not present

## 2021-06-15 DIAGNOSIS — M9901 Segmental and somatic dysfunction of cervical region: Secondary | ICD-10-CM | POA: Diagnosis not present

## 2021-06-18 ENCOUNTER — Telehealth: Payer: Self-pay

## 2021-06-18 MED ORDER — AMOXICILLIN 875 MG PO TABS: 875.0000 mg | ORAL_TABLET | Freq: Two times a day (BID) | ORAL | 0 refills | Status: AC

## 2021-06-18 NOTE — Telephone Encounter (Signed)
Patient called stating she is experiencing ear fullness & nasal mucus. Mucus is clear. She states it has been going on for about 4 or 5 days. She states it reminds her of the sinus infection she had around 11/14/20. She has done 2 Covid test and they were negative. She is doing breathing treatments that seem to help then she goes back to feeling stuffy.  She is requesting to not be put on steroids due to A1C Draw coming up on Friday to approve her knee surgery. She is wondering if she could do amoxicillin  like she did in July 2022?   [place]

## 2021-06-18 NOTE — Telephone Encounter (Signed)
Pt informed and stated understanding

## 2021-06-18 NOTE — Telephone Encounter (Signed)
Please see the following.

## 2021-06-18 NOTE — Telephone Encounter (Signed)
I sent in amoxicillin twice daily for 7 days.   Salvatore Marvel, MD [facility] [place] [facility] [place]

## 2021-06-19 ENCOUNTER — Other Ambulatory Visit: Payer: Self-pay | Admitting: *Deleted

## 2021-06-19 MED ORDER — FASENRA 30 MG/ML ~~LOC~~ SOSY: 30.0000 mg | PREFILLED_SYRINGE | SUBCUTANEOUS | 6 refills | Status: DC

## 2021-06-22 DIAGNOSIS — Z79899 Other long term (current) drug therapy: Secondary | ICD-10-CM | POA: Diagnosis not present

## 2021-06-22 DIAGNOSIS — E1159 Type 2 diabetes mellitus with other circulatory complications: Secondary | ICD-10-CM | POA: Diagnosis not present

## 2021-06-28 NOTE — Progress Notes (Addendum)
COVID swab appointment: 07/13/21 @ 1200  COVID Vaccine Completed: yes x2 Date COVID Vaccine completed: 10/15/19, 11/12/19 Has received booster: COVID vaccine manufacturer: Moderna     Date of COVID positive in last 90 days: no  PCP - Asencion Noble, MD Cardiologist - n/a  Chest x-ray - 09/25/20 Epic EKG - 05/10/21 Epic Stress Test - n/a ECHO - n/a Cardiac Cath - n/a Pacemaker/ICD device last checked: n/a Spinal Cord Stimulator: n/a  Bowel Prep - no  Sleep Study - n/a CPAP -   Fasting Blood Sugar - 70-110 Checks Blood Sugar _6_ times a day  Blood Thinner Instructions: Aspirin Instructions: ASA 81, hold 7 days Last Dose:  Activity level: Can go up a flight of stairs and perform activities of daily living without stopping and without symptoms of chest pain or shortness of breath.     Anesthesia review: HTN, DM2, COPD, murmur  Patient denies shortness of breath, fever, cough and chest pain at PAT appointment   Patient verbalized understanding of instructions that were given to them at the PAT appointment. Patient was also instructed that they will need to review over the PAT instructions again at home before surgery.

## 2021-07-02 DIAGNOSIS — M25551 Pain in right hip: Secondary | ICD-10-CM | POA: Diagnosis not present

## 2021-07-03 NOTE — Patient Instructions (Addendum)
DUE TO COVID-19 ONLY ONE VISITOR IS ALLOWED TO COME WITH YOU AND STAY IN THE WAITING ROOM ONLY DURING PRE OP AND PROCEDURE.   **NO VISITORS ARE ALLOWED IN THE SHORT STAY AREA OR RECOVERY ROOM!!**  IF YOU WILL BE ADMITTED INTO THE HOSPITAL YOU ARE ALLOWED ONLY TWO SUPPORT PEOPLE DURING VISITATION HOURS ONLY (7 AM -8PM)   The support person(s) must pass our screening, gel in and out, and wear a mask at all times, including in the patients room. Patients must also wear a mask when staff or their support person are in the room. Visitors GUEST BADGE MUST BE WORN VISIBLY  One adult visitor may remain with you overnight and MUST be in the room by 8 P.M.  No visitors under the age of 49. Any visitor under the age of 42 must be accompanied by an adult.    COVID SWAB TESTING MUST BE COMPLETED ON:  07/13/21 @ 12:00 PM   Site: [facility] [place]  Enter: Main Entrance have a seat in the waiting area to the right of main entrance (DO NOT [place]!!!!!) Dial: [phone] to alert staff you have arrived  You are not required to quarantine, however you are required to wear a well-fitted mask when you are out and around people not in your household.  Hand Hygiene often Do NOT share personal items Notify your provider if you are in close contact with someone who has COVID or you develop fever 100.4 or greater, new onset of sneezing, cough, sore throat, shortness of breath or body aches.   Your procedure is scheduled on: 07/17/21   Report to [facility] Main Entrance    Report to admitting at 10:10 AM   Call this number if you have problems the morning of surgery [phone]   Do not eat food :After Midnight.   May have liquids until 9:50 AM day of surgery  CLEAR LIQUID DIET  Foods Allowed                                                                     Foods Excluded  Water, Black Coffee and tea, regular and decaf                              liquids that you cannot  Plain Jell-O in any flavor  (No red)                                           see through such as: Fruit ices (not with fruit pulp)                                     milk, soups, orange juice              Iced Popsicles (No red)  All solid food                                   Apple juices Sports drinks like Gatorade (No red) Lightly seasoned clear broth or consume(fat free) Sugar      The day of surgery:  Drink ONE (1) Pre-Surgery G2 at 9:50 AM the morning of surgery. Drink in one sitting. Do not sip.  This drink was given to you during your hospital  pre-op appointment visit. Nothing else to drink after completing the  Pre-Surgery G2.          If you have questions, please contact your surgeons office.  FOLLOW BOWEL PREP AND ANY ADDITIONAL PRE OP INSTRUCTIONS YOU RECEIVED FROM YOUR SURGEON'S OFFICE!!!     Oral Hygiene is also important to reduce your risk of infection.                                    Remember - BRUSH YOUR TEETH THE MORNING OF SURGERY WITH YOUR REGULAR TOOTHPASTE   Stop all vitamins, supplements, and Aspirin 7 days before surgery.   Take these medicines the morning of surgery with A SIP OF WATER: Carvedilol, Gabapentin, Nebulizer, Pantoprazole, Tramadol, Tylenol  DO NOT TAKE ANY ORAL DIABETIC MEDICATIONS DAY OF YOUR SURGERY  How to Manage Your Diabetes Before and After Surgery  Why is it important to control my blood sugar before and after surgery? Improving blood sugar levels before and after surgery helps healing and can limit problems. A way of improving blood sugar control is eating a healthy diet by:  Eating less sugar and carbohydrates  Increasing activity/exercise  Talking with your doctor about reaching your blood sugar goals High blood sugars (greater than 180 mg/dL) can raise your risk of infections and slow your recovery, so you will need to focus on controlling your diabetes  during the weeks before surgery. Make sure that the doctor who takes care of your diabetes knows about your planned surgery including the date and location.  How do I manage my blood sugar before surgery? Check your blood sugar at least 4 times a day, starting 2 days before surgery, to make sure that the level is not too high or low. Check your blood sugar the morning of your surgery when you wake up and every 2 hours until you get to the Short Stay unit. If your blood sugar is less than 70 mg/dL, you will need to treat for low blood sugar: Do not take insulin. Treat a low blood sugar (less than 70 mg/dL) with  cup of clear juice (cranberry or apple), 4 glucose tablets, OR glucose gel. Recheck blood sugar in 15 minutes after treatment (to make sure it is greater than 70 mg/dL). If your blood sugar is not greater than 70 mg/dL on recheck, call [phone] for further instructions. Report your blood sugar to the short stay nurse when you get to Short Stay.  If you are admitted to the hospital after surgery: Your blood sugar will be checked by the staff and you will probably be given insulin after surgery (instead of oral diabetes medicines) to make sure you have good blood sugar levels. The goal for blood sugar control after surgery is 80-180 mg/dL.   WHAT DO I DO ABOUT MY DIABETES MEDICATION?  Do not take oral diabetes medicines (pills)  the morning of surgery.  THE DAY BEFORE SURGERY, take Metformin as prescribed. Take Novolog as prescribed sliding scale dose before meals, do not take a bedtime dose. Take Lantus as prescribed. Do not take Victoza.      THE MORNING OF SURGERY, do no take Metformin. Do not take Novolog unless blood sugar is greater than 220, then take half of dose. Take 50% of lantus dose. Do not take Victoza  The day of surgery, do not take other diabetes injectables, including Byetta (exenatide), Bydureon (exenatide ER), Victoza (liraglutide), or Trulicity  (dulaglutide).  If your CBG is greater than 220 mg/dL, you may take  of your sliding scale  (correction) dose of insulin.    For patients with insulin pumps: Contact your diabetes doctor for specific instructions before surgery. Decrease basal rates by 20% at midnight the night before your surgery. Note that if your surgery is planned to be longer than 2 hours, your insulin pump will be removed and intravenous (IV) insulin will be started and managed by the nurses and the anesthesiologist. You will be able to restart your insulin pump once you are awake and able to manage it.  Make sure to bring insulin pump supplies to the hospital with you in case the  site needs to be changed.   Reviewed and Endorsed by [facility] Patient Education Committee, August 2015                               You may not have any metal on your body including hair pins, jewelry, and body piercing             Do not wear make-up, lotions, powders, perfumes, or deodorant  Do not wear nail polish including gel and S&S, artificial/acrylic nails, or any other type of covering on natural nails including finger and toenails. If you have artificial nails, gel coating, etc. that needs to be removed by a nail salon please have this removed prior to surgery or surgery may need to be canceled/ delayed if the surgeon/ anesthesia feels like they are unable to be safely monitored.   Do not shave  48 hours prior to surgery.    Do not bring valuables to the hospital. [place].   Bring small overnight bag day of surgery.   Special Instructions: Bring a copy of your healthcare power of attorney and living will documents         the day of surgery if you haven't scanned them before.              Please read over the following fact sheets you were given: IF YOU HAVE QUESTIONS ABOUT YOUR PRE-OP INSTRUCTIONS PLEASE CALL [facility] - Preparing for  Surgery Before surgery, you can play an important role.  Because skin is not sterile, your skin needs to be as free of germs as possible.  You can reduce the number of germs on your skin by washing with CHG (chlorahexidine gluconate) soap before surgery.  CHG is an antiseptic cleaner which kills germs and bonds with the skin to continue killing germs even after washing. Please DO NOT use if you have an allergy to CHG or antibacterial soaps.  If your skin becomes reddened/irritated stop using the CHG and inform your nurse when you  arrive at Short Stay. Do not shave (including legs and underarms) for at least 48 hours prior to the first CHG shower.  You may shave your face/neck.  Please follow these instructions carefully:  1.  Shower with CHG Soap the night before surgery and the  morning of surgery.  2.  If you choose to wash your hair, wash your hair first as usual with your normal  shampoo.  3.  After you shampoo, rinse your hair and body thoroughly to remove the shampoo.                             4.  Use CHG as you would any other liquid soap.  You can apply chg directly to the skin and wash.  Gently with a scrungie or clean washcloth.  5.  Apply the CHG Soap to your body ONLY FROM THE NECK DOWN.   Do   not use on face/ open                           Wound or open sores. Avoid contact with eyes, ears mouth and   genitals (private parts).                       Wash face,  Genitals (private parts) with your normal soap.             6.  Wash thoroughly, paying special attention to the area where your    surgery  will be performed.  7.  Thoroughly rinse your body with warm water from the neck down.  8.  DO NOT shower/wash with your normal soap after using and rinsing off the CHG Soap.                9.  Pat yourself dry with a clean towel.            10.  Wear clean pajamas.            11.  Place clean sheets on your bed the night of your first shower and do not  sleep with pets. Day of Surgery  : Do not apply any lotions/deodorants the morning of surgery.  Please wear clean clothes to the hospital/surgery center.  FAILURE TO FOLLOW THESE INSTRUCTIONS MAY RESULT IN THE CANCELLATION OF YOUR SURGERY  PATIENT SIGNATURE_________________________________  NURSE SIGNATURE__________________________________  ________________________________________________________________________   Kathy Tran  An incentive spirometer is a tool that can help keep your lungs clear and active. This tool measures how well you are filling your lungs with each breath. Taking long deep breaths may help reverse or decrease the chance of developing breathing (pulmonary) problems (especially infection) following: A long period of time when you are unable to move or be active. BEFORE THE PROCEDURE  If the spirometer includes an indicator to show your best effort, your nurse or respiratory therapist will set it to a desired goal. If possible, sit up straight or lean slightly forward. Try not to slouch. Hold the incentive spirometer in an upright position. INSTRUCTIONS FOR USE  Sit on the edge of your bed if possible, or sit up as far as you can in bed or on a chair. Hold the incentive spirometer in an upright position. Breathe out normally. Place the mouthpiece in your mouth and seal your lips tightly around it. Breathe in slowly and as deeply as possible, raising  the piston or the ball toward the top of the column. Hold your breath for 3-5 seconds or for as long as possible. Allow the piston or ball to fall to the bottom of the column. Remove the mouthpiece from your mouth and breathe out normally. Rest for a few seconds and repeat Steps 1 through 7 at least 10 times every 1-2 hours when you are awake. Take your time and take a few normal breaths between deep breaths. The spirometer may include an indicator to show your best effort. Use the indicator as a goal to work toward during each repetition. After  each set of 10 deep breaths, practice coughing to be sure your lungs are clear. If you have an incision (the cut made at the time of surgery), support your incision when coughing by placing a pillow or rolled up towels firmly against it. Once you are able to get out of bed, walk around indoors and cough well. You may stop using the incentive spirometer when instructed by your caregiver.  RISKS AND COMPLICATIONS Take your time so you do not get dizzy or light-headed. If you are in pain, you may need to take or ask for pain medication before doing incentive spirometry. It is harder to take a deep breath if you are having pain. AFTER USE Rest and breathe slowly and easily. It can be helpful to keep track of a log of your progress. Your caregiver can provide you with a simple table to help with this. If you are using the spirometer at home, follow these instructions: [place] IF:  You are having difficultly using the spirometer. You have trouble using the spirometer as often as instructed. Your pain medication is not giving enough relief while using the spirometer. You develop fever of 100.5 F (38.1 C) or higher. SEEK IMMEDIATE MEDICAL CARE IF:  You cough up bloody sputum that had not been present before. You develop fever of 102 F (38.9 C) or greater. You develop worsening pain at or near the incision site. MAKE SURE YOU:  Understand these instructions. Will watch your condition. Will get help right away if you are not doing well or get worse. Document Released: 09/09/2006 Document Revised: 07/22/2011 Document Reviewed: 11/10/2006 Exit[employer] Patient Information 2014 [employer], [place].   ________________________________________________________________________  WHAT IS A BLOOD TRANSFUSION? Blood Transfusion Information  A transfusion is the replacement of blood or some of its parts. Blood is made up of multiple cells which provide different functions. Red blood cells carry oxygen  and are used for blood loss replacement. White blood cells fight against infection. Platelets control bleeding. Plasma helps clot blood. Other blood products are available for specialized needs, such as hemophilia or other clotting disorders. BEFORE THE TRANSFUSION  Who gives blood for transfusions?  Healthy volunteers who are fully evaluated to make sure their blood is safe. This is blood bank blood. Transfusion therapy is the safest it has ever been in the practice of medicine. Before blood is taken from a donor, a complete history is taken to make sure that person has no history of diseases nor engages in risky social behavior (examples are intravenous drug use or sexual activity with multiple partners). The donor's travel history is screened to minimize risk of transmitting infections, such as malaria. The donated blood is tested for signs of infectious diseases, such as HIV and hepatitis. The blood is then tested to be sure it is compatible with you in order to minimize the chance of a transfusion reaction.  If you or a relative donates blood, this is often done in anticipation of surgery and is not appropriate for emergency situations. It takes many days to process the donated blood. RISKS AND COMPLICATIONS Although transfusion therapy is very safe and saves many lives, the main dangers of transfusion include:  Getting an infectious disease. Developing a transfusion reaction. This is an allergic reaction to something in the blood you were given. Every precaution is taken to prevent this. The decision to have a blood transfusion has been considered carefully by your caregiver before blood is given. Blood is not given unless the benefits outweigh the risks. AFTER THE TRANSFUSION Right after receiving a blood transfusion, you will usually feel much better and more energetic. This is especially true if your red blood cells have gotten low (anemic). The transfusion raises the level of the red blood  cells which carry oxygen, and this usually causes an energy increase. The nurse administering the transfusion will monitor you carefully for complications. HOME CARE INSTRUCTIONS  No special instructions are needed after a transfusion. You may find your energy is better. Speak with your caregiver about any limitations on activity for underlying diseases you may have. SEEK MEDICAL CARE IF:  Your condition is not improving after your transfusion. You develop redness or irritation at the intravenous (IV) site. SEEK IMMEDIATE MEDICAL CARE IF:  Any of the following symptoms occur over the next 12 hours: Shaking chills. You have a temperature by mouth above 102 F (38.9 C), not controlled by medicine. Chest, back, or muscle pain. People around you feel you are not acting correctly or are confused. Shortness of breath or difficulty breathing. Dizziness and fainting. You get a rash or develop hives. You have a decrease in urine output. Your urine turns a dark color or changes to pink, red, or brown. Any of the following symptoms occur over the next 10 days: You have a temperature by mouth above 102 F (38.9 C), not controlled by medicine. Shortness of breath. Weakness after normal activity. The white part of the eye turns yellow (jaundice). You have a decrease in the amount of urine or are urinating less often. Your urine turns a dark color or changes to pink, red, or brown. Document Released: 04/26/2000 Document Revised: 07/22/2011 Document Reviewed: 12/14/2007 [facility] Patient Information 2014 [place], [place].  _______________________________________________________________________

## 2021-07-04 ENCOUNTER — Ambulatory Visit (INDEPENDENT_AMBULATORY_CARE_PROVIDER_SITE_OTHER): Payer: Medicare HMO

## 2021-07-04 ENCOUNTER — Encounter (HOSPITAL_COMMUNITY)
Admission: RE | Admit: 2021-07-04 | Discharge: 2021-07-04 | Disposition: A | Discharge status: Home / Self Care | Payer: Medicare HMO | Source: Ambulatory Visit | Attending: Orthopedic Surgery | Admitting: Orthopedic Surgery

## 2021-07-04 ENCOUNTER — Other Ambulatory Visit: Payer: Self-pay

## 2021-07-04 ENCOUNTER — Encounter (HOSPITAL_COMMUNITY): Payer: Self-pay

## 2021-07-04 VITALS — BP 131/69 | HR 77 | Temp 98.4°F | Resp 16 | Ht 62.0 in | Wt 157.6 lb

## 2021-07-04 DIAGNOSIS — J4550 Severe persistent asthma, uncomplicated: Secondary | ICD-10-CM | POA: Diagnosis not present

## 2021-07-04 DIAGNOSIS — E1159 Type 2 diabetes mellitus with other circulatory complications: Secondary | ICD-10-CM | POA: Diagnosis not present

## 2021-07-04 DIAGNOSIS — Z01812 Encounter for preprocedural laboratory examination: Secondary | ICD-10-CM | POA: Insufficient documentation

## 2021-07-04 DIAGNOSIS — Z01818 Encounter for other preprocedural examination: Secondary | ICD-10-CM

## 2021-07-04 DIAGNOSIS — Z20822 Contact with and (suspected) exposure to covid-19: Secondary | ICD-10-CM | POA: Insufficient documentation

## 2021-07-04 HISTORY — DX: Pneumonia, unspecified organism: J18.9

## 2021-07-04 LAB — BASIC METABOLIC PANEL
Anion gap: 7 (ref 5–15)
BUN: 23 mg/dL (ref 8–23)
CO2: 25 mmol/L (ref 22–32)
Calcium: 9.1 mg/dL (ref 8.9–10.3)
Chloride: 107 mmol/L (ref 98–111)
Creatinine, Ser: 0.67 mg/dL (ref 0.44–1.00)
GFR, Estimated: >60 mL/min (ref >60)
Glucose, Bld: 107 mg/dL — ABNORMAL HIGH (ref 70–99)
Potassium: 3.9 mmol/L (ref 3.5–5.1)
Sodium: 139 mmol/L (ref 135–145)

## 2021-07-04 LAB — CBC
HCT: 38.7 % (ref 36.0–46.0)
Hemoglobin: 12.8 g/dL (ref 12.0–15.0)
MCH: 32.3 pg (ref 26.0–34.0)
MCHC: 33.1 g/dL (ref 30.0–36.0)
MCV: 97.7 fL (ref 80.0–100.0)
Platelets: 215 10*3/uL (ref 150–400)
RBC: 3.96 MIL/uL (ref 3.87–5.11)
RDW: 12.7 % (ref 11.5–15.5)
WBC: 5.1 10*3/uL (ref 4.0–10.5)
nRBC: 0 % (ref 0.0–0.2)

## 2021-07-04 LAB — TYPE AND SCREEN
ABO/RH(D): A POS
Antibody Screen: NEGATIVE

## 2021-07-04 LAB — SURGICAL PCR SCREEN
MRSA, PCR: NEGATIVE
Staphylococcus aureus: NEGATIVE

## 2021-07-04 LAB — GLUCOSE, CAPILLARY: Glucose-Capillary: 104 mg/dL — ABNORMAL HIGH (ref 70–99)

## 2021-07-05 LAB — HEMOGLOBIN A1C
Hgb A1c MFr Bld: 6.4 % — ABNORMAL HIGH (ref 4.8–5.6)
Mean Plasma Glucose: 137 mg/dL

## 2021-07-06 DIAGNOSIS — M9903 Segmental and somatic dysfunction of lumbar region: Secondary | ICD-10-CM | POA: Diagnosis not present

## 2021-07-06 DIAGNOSIS — M542 Cervicalgia: Secondary | ICD-10-CM | POA: Diagnosis not present

## 2021-07-06 DIAGNOSIS — M9901 Segmental and somatic dysfunction of cervical region: Secondary | ICD-10-CM | POA: Diagnosis not present

## 2021-07-06 DIAGNOSIS — M62830 Muscle spasm of back: Secondary | ICD-10-CM | POA: Diagnosis not present

## 2021-07-06 DIAGNOSIS — M9905 Segmental and somatic dysfunction of pelvic region: Secondary | ICD-10-CM | POA: Diagnosis not present

## 2021-07-06 DIAGNOSIS — M5441 Lumbago with sciatica, right side: Secondary | ICD-10-CM | POA: Diagnosis not present

## 2021-07-06 DIAGNOSIS — M546 Pain in thoracic spine: Secondary | ICD-10-CM | POA: Diagnosis not present

## 2021-07-06 DIAGNOSIS — M9902 Segmental and somatic dysfunction of thoracic region: Secondary | ICD-10-CM | POA: Diagnosis not present

## 2021-07-09 DIAGNOSIS — D839 Common variable immunodeficiency, unspecified: Secondary | ICD-10-CM | POA: Diagnosis not present

## 2021-07-10 DIAGNOSIS — E119 Type 2 diabetes mellitus without complications: Secondary | ICD-10-CM | POA: Diagnosis not present

## 2021-07-10 DIAGNOSIS — Z0001 Encounter for general adult medical examination with abnormal findings: Secondary | ICD-10-CM | POA: Diagnosis not present

## 2021-07-10 DIAGNOSIS — I10 Essential (primary) hypertension: Secondary | ICD-10-CM | POA: Diagnosis not present

## 2021-07-10 NOTE — Care Plan (Signed)
Ortho Bundle Case Management Note  Patient Details  Name: Kathy Tran MRN: [id number] Date of Birth: [birth year]  Met with patient in the office for H&P visit. She will discharge to home with family. Has equipment at home. OPPT set up with [facility]. Patient and MD in agreement with plan. Choice offered                 DME Arranged:    DME Agency:     HH Arranged:    HH Agency:     Additional Comments: Please contact me with any questions of if this plan should need to change.  Ladell Heads,  [place] Specialist  [phone] 07/10/2021, 11:56 AM

## 2021-07-11 ENCOUNTER — Other Ambulatory Visit: Payer: Self-pay | Admitting: Allergy & Immunology

## 2021-07-13 ENCOUNTER — Other Ambulatory Visit: Payer: Self-pay

## 2021-07-13 ENCOUNTER — Encounter (HOSPITAL_COMMUNITY)
Admission: RE | Admit: 2021-07-13 | Discharge: 2021-07-13 | Disposition: A | Discharge status: Home / Self Care | Payer: Medicare HMO | Source: Ambulatory Visit | Attending: Orthopedic Surgery | Admitting: Orthopedic Surgery

## 2021-07-13 DIAGNOSIS — Z01812 Encounter for preprocedural laboratory examination: Secondary | ICD-10-CM | POA: Diagnosis not present

## 2021-07-13 DIAGNOSIS — Z20822 Contact with and (suspected) exposure to covid-19: Secondary | ICD-10-CM | POA: Insufficient documentation

## 2021-07-13 DIAGNOSIS — Z01818 Encounter for other preprocedural examination: Secondary | ICD-10-CM

## 2021-07-14 LAB — SARS CORONAVIRUS 2 (TAT 6-24 HRS): SARS Coronavirus 2: NEGATIVE

## 2021-07-16 NOTE — H&P (Signed)
HIP ARTHROPLASTY ADMISSION H&P  Patient ID: Kathy Tran MRN: [id number] DOB/AGE: 64-14-1957 64 y.o.  Chief Complaint: right hip pain.  Planned Procedure Date: 07-17-21 Medical and Cardiac Clearance by Dr. Willey Blade   Pulmonary Clearance by Dr. Kara Mead Immunology clearance by Dr. Salvatore Marvel   HPI: Kathy Tran is a 66 y.o. female who presents for evaluation of OA RIGHT HIP. The patient has a history of pain and functional disability in the right hip due to arthritis and has failed non-surgical conservative treatments for greater than 12 weeks to include NSAID's and/or analgesics, use of assistive devices, weight reduction as appropriate, and activity modification.  Onset of symptoms was gradual, starting 1 year ago with rapidlly worsening course since that time. The patient noted no past surgery on the right hip.  Patient currently rates pain at 10 out of 10 with activity. Patient has night pain, worsening of pain with activity and weight bearing, and pain that interferes with activities of daily living.  Patient has evidence of subchondral sclerosis, periarticular osteophytes, and joint space narrowing by imaging studies.  There is no active infection.  Past Medical History:  Diagnosis Date   Angio-edema    Arthritis    Asthma    Flu march and May 2018   COPD (chronic obstructive pulmonary disease) ([place])    Diabetes mellitus type 2, controlled (HCC)    x5 yrs   Eczema    GERD (gastroesophageal reflux disease)    Heart murmur    History of kidney stones    Hypertension    x 5 yrs   Hypogammaglobulinemia ([place])    Pneumonia    Recurrent upper respiratory infection (URI)    Renal disorder    renal stones   Urticaria    Past Surgical History:  Procedure Laterality Date   ADENOIDECTOMY     BIOPSY  04/01/2018   Procedure: BIOPSY;  Surgeon: Rogene Houston, MD;  [facility];  Service: Endoscopy;;   CARPOMETACARPEL SUSPENSION PLASTY Right 04/13/2019   Procedure:  CARPOMETACARPEL [facility]) SUSPENSION PLASTY RIGHT THUMB;  Surgeon: Daryll Brod, MD;  Location: [facility];  Service: Orthopedics;  Laterality: Right;  AXILLARY BLOCK   CATARACT EXTRACTION W/PHACO Left 01/19/2018   Procedure: CATARACT EXTRACTION PHACO AND INTRAOCULAR LENS PLACEMENT LEFT EYE;  Surgeon: Tonny Branch, MD;  Location: [facility];  Service: Ophthalmology;  Laterality: Left;  CDE: 7.52   CATARACT EXTRACTION W/PHACO Right 02/23/2018   Procedure: CATARACT EXTRACTION PHACO AND INTRAOCULAR LENS PLACEMENT (IOC);  Surgeon: Tonny Branch, MD;  Location: [facility];  Service: Ophthalmology;  Laterality: Right;  CDE: 5.47   [place], 1986   COLONOSCOPY  03/27/2011   Procedure: COLONOSCOPY;  Surgeon: Rogene Houston, MD;  [facility];  Service: Endoscopy;  Laterality: N/A;  2:00   COLONOSCOPY N/A 04/01/2018   Procedure: COLONOSCOPY;  Surgeon: Rogene Houston, MD;  [facility];  Service: Endoscopy;  Laterality: N/A;  [place] Left 11/08/2015   Procedure: CYSTOSCOPY, URETEROSCOPY, WITH STONE BASKET EXTRACTION;  Surgeon: Raynelle Bring, MD;  Location: [facility];  Service: Urology;  Laterality: Left;   CYSTOSCOPY/RETROGRADE/URETEROSCOPY/STONE EXTRACTION WITH BASKET Right 07/03/2015   Procedure: CYSTOSCOPY/RETROGRADE/URETEROSCOPY/STONE EXTRACTION WITH BASKET;  Surgeon: Irine Seal, MD;  Location: [facility];  Service: Urology;  Laterality: Right;   ENDOMETRIAL ABLATION     FLEXIBLE BRONCHOSCOPY Bilateral 10/31/2016   Procedure: FLEXIBLE BRONCHOSCOPY WITH PROPOFOL;  Surgeon: Sinda Du, MD;  [facility];  Service: Cardiopulmonary;  Laterality: Bilateral;   FLEXIBLE BRONCHOSCOPY N/A 01/17/2017   Procedure: FLEXIBLE BRONCHOSCOPY WITH PROPOFOL;  Surgeon: Sinda Du, MD;  [facility];  Service: Cardiopulmonary;  Laterality: N/A;   KIDNEY STONE SURGERY     multiple   SHOULDER SURGERY     SINOSCOPY      TONSILLECTOMY     TUBAL LIGATION  1991   TYMPANOSTOMY TUBE PLACEMENT     Allergies  Allergen Reactions   Sulfa Antibiotics Anaphylaxis   Hydrocodone Nausea And Vomiting   Prior to Admission medications   Medication Sig Start Date End Date Taking? Authorizing Provider  acetaminophen (TYLENOL) 500 MG tablet Take 1,000 mg by mouth in the morning and at bedtime.   Yes [provider]  amLODipine (NORVASC) 5 MG tablet TAKE 1 TABLET BY MOUTH AT BEDTIME 06/20/19  Yes [provider]  aspirin EC 81 MG tablet Take 81 mg by mouth at bedtime.   Yes [provider]  Benralizumab (FASENRA) 30 MG/ML SOSY Inject 1 mL (30 mg total) into the skin every 8 (eight) weeks. 06/19/21  Yes Kathy Shaggy, MD  budesonide (PULMICORT) 0.5 MG/2ML nebulizer solution Add twice daily for 1-2 weeks during respiratory flares. Patient taking differently: Take 0.5 mg by nebulization 2 (two) times daily as needed (Add twice daily for 1-2 weeks during respiratory flares.). 01/05/21  Yes Kathy Shaggy, MD  carvedilol (COREG) 25 MG tablet Take 25 mg by mouth 2 (two) times daily with a meal.   Yes [provider]  diphenhydrAMINE (BENADRYL) 25 MG tablet Take 25 mg by mouth at bedtime.   Yes [provider]  famotidine (PEPCID) 20 MG tablet Take 20 mg by mouth at bedtime.   Yes [provider]  gabapentin (NEURONTIN) 300 MG capsule Take 300 mg by mouth 2 (two) times daily. 04/09/21  Yes [provider]  guaiFENesin (MUCINEX) 600 MG 12 hr tablet Take 1 tablet (600 mg total) by mouth 2 (two) times daily. 09/30/16  Yes Asencion Noble, MD  hydrochlorothiazide (HYDRODIURIL) 25 MG tablet Take 25 mg by mouth daily. 07/26/20  Yes [provider]  insulin aspart (NOVOLOG FLEXPEN) 100 UNIT/ML FlexPen Inject 2 Units into the skin 3 (three) times daily with meals. Per sliding scale   Yes [provider]  ipratropium (ATROVENT) 0.03 % nasal spray USE 2 SPRAYS IN  EACH NOSTRIL THREE TIMES DAILY Patient taking differently: Place 2 sprays into both nostrils daily as needed for rhinitis. 01/05/21  Yes Kathy Shaggy, MD  ipratropium-albuterol (DUONEB) 0.5-2.5 (3) MG/3ML SOLN INHALE THE CONTENTS OF 1 VIAL VIA NEBULIZER EVERY FOUR HOURS AS NEEDED. 02/13/21  Yes Kathy Shaggy, MD  LANTUS SOLOSTAR 100 UNIT/ML Solostar Pen Inject 40 Units into the skin in the morning. 09/03/20  Yes [provider]  LORazepam (ATIVAN) 0.5 MG tablet Take 0.5 mg by mouth at bedtime. 03/05/17  Yes [provider]  meloxicam (MOBIC) 15 MG tablet Take 15 mg by mouth in the morning.   Yes [provider]  metFORMIN (GLUCOPHAGE-XR) 500 MG 24 hr tablet Take 2,000 mg by mouth daily with breakfast. 04/17/15  Yes [provider]  pantoprazole (PROTONIX) 40 MG tablet Take 1 tablet (40 mg total) by mouth 2 (two) times daily before a meal. 07/23/16  Yes Asencion Noble, MD  pravastatin (PRAVACHOL) 10 MG tablet Take 10 mg by mouth at bedtime.  06/18/15  Yes [provider]  PRESCRIPTION MEDICATION  Inject into the vein every 28 (twenty-eight) days. Immunoglobulin Hormone replacement therapy   Yes [provider]  valsartan (DIOVAN) 320 MG tablet Take 320 mg by mouth at bedtime. 11/02/19  Yes [provider]  VENTOLIN HFA 108 (90 Base) MCG/ACT inhaler INHALE 2 PUFFS EVERY 4 HOURS AS NEEDED FOR WHEEZING OR SHORTNESS OF BREATH. 02/13/21  Yes Kathy Shaggy, MD  VICTOZA 18 MG/3ML SOPN Inject 1.8 mg into the skin daily at 8 pm. 10/31/15  Yes [provider]  Alcohol Swabs (B-D SINGLE USE SWABS REGULAR) PADS  08/17/19   [provider]  Blood Glucose Calibration (TRUE METRIX LEVEL 1) Low SOLN  08/13/19   [provider]  Blood Glucose Monitoring Suppl (TRUE METRIX AIR GLUCOSE METER) w/Device KIT  08/18/19   [provider]  BREZTRI AEROSPHERE 160-9-4.8 MCG/ACT AERO INHALE 2 PUFFS INTO THE LUNGS TWICE DAILY  WITH SPACER. RINSE, GARGLE AND SPIT AFTER USE. 07/12/21   Kathy Shaggy, MD  DROPLET PEN NEEDLES 32G X 4 MM MISC  08/13/19   [provider]  nystatin (MYCOSTATIN) 100000 UNIT/ML suspension Take 5 mLs (500,000 Units total) by mouth in the morning and at bedtime. Swish and swallow Patient taking differently: Take 5 mLs by mouth 2 (two) times daily as needed (thrush). Swish and swallow 07/05/20   Kathy Shaggy, MD  TRUE [facility] BLOOD GLUCOSE TEST test strip  08/17/19   [provider]  TRUEplus Lancets 33G [place]  08/17/19   [provider]   Social History   Socioeconomic History   Marital status: Married    Spouse name: Not on file   Number of children: Not on file   Years of education: Not on file   Highest education level: Not on file  Occupational History   Not on file  Tobacco Use   Smoking status: Never   Smokeless tobacco: Never  Vaping Use   Vaping Use: Never used  Substance and Sexual Activity   Alcohol use: Not Currently   Drug use: No   Sexual activity: Yes    Birth control/protection: Surgical, Post-menopausal    Comment: tubal and ablation  Other Topics Concern   Not on file  Social History Narrative   Not on file   Social Determinants of Health   Financial Resource Strain: Not on file  Food Insecurity: Not on file  Transportation Needs: Not on file  Physical Activity: Not on file  Stress: Not on file  Social Connections: Not on file   Family History  Problem Relation Age of Onset   COPD Mother    Cancer Father     ROS: Currently denies lightheadedness, dizziness, Fever, chills, CP, SOB.   No personal history of DVT, PE, MI, or CVA. No loose teeth or dentures All other systems have been reviewed and were otherwise currently negative with the exception of those mentioned in the HPI and as above.  Objective: Vitals: Ht: 5'2" Wt: 147.2 lbs Temp: 97.8 BP: 135/63 Pulse: 75 O2 95% on room air.   Physical Exam: General:  Alert, NAD. Trendelenberg Gait. Limping. Can't sit for long d/t pain  HEENT: EOMI, Good Neck Extension  Pulm: No increased work of breathing.  Clear B/L A/P w/o crackle or wheeze.  CV: RRR, No m/g/r appreciated  GI: soft, NT, ND. BS x 4 quadrants Neuro: CN II-XII grossly intact without focal deficit.  Sensation intact distally Skin: No lesions in the area of chief complaint MSK/Surgical Site: + TTP. Severe  hip pain with ROM. + Stinchfield. + SLR. + FABER/FADIR. Decreased strength.  NVI.    Imaging Review Plain radiographs demonstrate severe degenerative joint disease of the right hip.   The bone quality appears to be fair for age and reported activity level.  Preoperative templating of the joint replacement has been completed, documented, and submitted to the Operating Room personnel in order to optimize intra-operative equipment management.  Assessment: OA RIGHT HIP Active Problems:   * No active hospital problems. *   Plan: Plan for Procedure(s): TOTAL HIP ARTHROPLASTY ANTERIOR APPROACH  The patient history, physical exam, clinical judgement of the provider and imaging are consistent with end stage degenerative joint disease and total joint arthroplasty is deemed medically necessary. The treatment options including medical management, injection therapy, and arthroplasty were discussed at length. The risks and benefits of Procedure(s): TOTAL HIP ARTHROPLASTY ANTERIOR APPROACH were presented and reviewed.  The risks of nonoperative treatment, versus surgical intervention including but not limited to continued pain, aseptic loosening, stiffness, dislocation/subluxation, infection, bleeding, nerve injury, blood clots, cardiopulmonary complications, morbidity, mortality, among others were discussed. The patient verbalizes understanding and wishes to proceed with the plan.  Patient is being admitted for surgery, pain control, PT, prophylactic antibiotics, VTE prophylaxis, progressive  ambulation, ADL's and discharge planning. She will spend the night in observation.   Dental prophylaxis discussed and recommended for 2 years postoperatively.  The patient does meet the criteria for TXA which will be used perioperatively.   ASA 81 mg BID will be used postoperatively for DVT prophylaxis in addition to SCDs, and early ambulation. She already has enough at home and doesn't need me to send any more.  Plan for Oxycodone, Mobic, Tylenol for pain.   Robaxin for muscle spasm.  Zofran for nausea and vomiting. Colace for constipation Pharmacy- [facility] on [address] in [place] The patient is planning to be discharged home with OPPT and into the care of her husband Lanny Hurst who can be reached at [phone] Follow up appt 07-04-21 at 4:15pm     Alisa Graff Office [phone] 07/16/2021 3:15 PM

## 2021-07-17 ENCOUNTER — Other Ambulatory Visit: Payer: Self-pay

## 2021-07-17 ENCOUNTER — Observation Stay (HOSPITAL_COMMUNITY)
Admission: RE | Admit: 2021-07-17 | Discharge: 2021-07-18 | Disposition: A | Discharge status: Home / Self Care | Payer: Medicare HMO | Source: Ambulatory Visit | Attending: Orthopedic Surgery | Admitting: Orthopedic Surgery

## 2021-07-17 ENCOUNTER — Encounter (HOSPITAL_COMMUNITY): Payer: Self-pay | Admitting: Orthopedic Surgery

## 2021-07-17 ENCOUNTER — Ambulatory Visit (HOSPITAL_COMMUNITY): Payer: Medicare HMO

## 2021-07-17 ENCOUNTER — Ambulatory Visit (HOSPITAL_COMMUNITY): Payer: Medicare HMO | Admitting: Physician Assistant

## 2021-07-17 ENCOUNTER — Encounter (HOSPITAL_COMMUNITY)
Admission: RE | Disposition: A | Discharge status: Home / Self Care | Payer: Self-pay | Source: Ambulatory Visit | Attending: Orthopedic Surgery

## 2021-07-17 ENCOUNTER — Ambulatory Visit (HOSPITAL_BASED_OUTPATIENT_CLINIC_OR_DEPARTMENT_OTHER): Payer: Medicare HMO | Admitting: Anesthesiology

## 2021-07-17 DIAGNOSIS — Z20822 Contact with and (suspected) exposure to covid-19: Secondary | ICD-10-CM | POA: Insufficient documentation

## 2021-07-17 DIAGNOSIS — Z7984 Long term (current) use of oral hypoglycemic drugs: Secondary | ICD-10-CM | POA: Diagnosis not present

## 2021-07-17 DIAGNOSIS — E119 Type 2 diabetes mellitus without complications: Secondary | ICD-10-CM

## 2021-07-17 DIAGNOSIS — J45909 Unspecified asthma, uncomplicated: Secondary | ICD-10-CM | POA: Diagnosis not present

## 2021-07-17 DIAGNOSIS — Z471 Aftercare following joint replacement surgery: Secondary | ICD-10-CM | POA: Diagnosis not present

## 2021-07-17 DIAGNOSIS — Z794 Long term (current) use of insulin: Secondary | ICD-10-CM | POA: Diagnosis not present

## 2021-07-17 DIAGNOSIS — J449 Chronic obstructive pulmonary disease, unspecified: Secondary | ICD-10-CM | POA: Insufficient documentation

## 2021-07-17 DIAGNOSIS — M1611 Unilateral primary osteoarthritis, right hip: Secondary | ICD-10-CM | POA: Diagnosis not present

## 2021-07-17 DIAGNOSIS — Z7982 Long term (current) use of aspirin: Secondary | ICD-10-CM | POA: Insufficient documentation

## 2021-07-17 DIAGNOSIS — Z7951 Long term (current) use of inhaled steroids: Secondary | ICD-10-CM | POA: Insufficient documentation

## 2021-07-17 DIAGNOSIS — I10 Essential (primary) hypertension: Secondary | ICD-10-CM | POA: Insufficient documentation

## 2021-07-17 DIAGNOSIS — Z419 Encounter for procedure for purposes other than remedying health state, unspecified: Secondary | ICD-10-CM

## 2021-07-17 DIAGNOSIS — Z96641 Presence of right artificial hip joint: Secondary | ICD-10-CM | POA: Diagnosis not present

## 2021-07-17 HISTORY — PX: TOTAL HIP ARTHROPLASTY: SHX124

## 2021-07-17 LAB — GLUCOSE, CAPILLARY
Glucose-Capillary: 123 mg/dL — ABNORMAL HIGH (ref 70–99)
Glucose-Capillary: 81 mg/dL (ref 70–99)
Glucose-Capillary: 317 mg/dL — ABNORMAL HIGH (ref 70–99)

## 2021-07-17 LAB — ABO/RH: ABO/RH(D): A POS

## 2021-07-17 SURGERY — ARTHROPLASTY, HIP, TOTAL, ANTERIOR APPROACH
Anesthesia: Spinal | Site: Hip | Laterality: Right

## 2021-07-17 MED ORDER — OXYCODONE HCL 5 MG PO TABS
10.0000 mg | ORAL_TABLET | ORAL | Status: DC | PRN
  Filled 2021-07-17: qty 2

## 2021-07-17 MED ORDER — WATER FOR IRRIGATION, STERILE IR SOLN
Status: DC | PRN
  Administered 2021-07-17 (×2): 1000 mL

## 2021-07-17 MED ORDER — IPRATROPIUM BROMIDE 0.03 % NA SOLN
2.0000 | Freq: Every day | NASAL | Status: DC | PRN
  Filled 2021-07-17: qty 30

## 2021-07-17 MED ORDER — TRANEXAMIC ACID-NACL 1000-0.7 MG/100ML-% IV SOLN
1000.0000 mg | Freq: Once | INTRAVENOUS | Status: AC
  Administered 2021-07-17: 1000 mg via INTRAVENOUS
  Filled 2021-07-17: qty 100

## 2021-07-17 MED ORDER — LIRAGLUTIDE 18 MG/3ML ~~LOC~~ SOPN: 1.8000 mg | PEN_INJECTOR | Freq: Every day | SUBCUTANEOUS | Status: DC

## 2021-07-17 MED ORDER — UMECLIDINIUM BROMIDE 62.5 MCG/ACT IN AEPB
1.0000 | INHALATION_SPRAY | Freq: Every day | RESPIRATORY_TRACT | Status: DC
  Administered 2021-07-18: 1 via RESPIRATORY_TRACT
  Filled 2021-07-17: qty 7

## 2021-07-17 MED ORDER — DOCUSATE SODIUM 100 MG PO CAPS
100.0000 mg | ORAL_CAPSULE | Freq: Two times a day (BID) | ORAL | Status: DC
  Administered 2021-07-17 – 2021-07-18 (×2): 100 mg via ORAL
  Filled 2021-07-17 (×2): qty 1

## 2021-07-17 MED ORDER — GUAIFENESIN ER 600 MG PO TB12
600.0000 mg | ORAL_TABLET | Freq: Two times a day (BID) | ORAL | Status: DC
  Administered 2021-07-17 – 2021-07-18 (×2): 600 mg via ORAL
  Filled 2021-07-17 (×2): qty 1

## 2021-07-17 MED ORDER — DEXAMETHASONE SODIUM PHOSPHATE 10 MG/ML IJ SOLN
INTRAMUSCULAR | Status: AC
  Filled 2021-07-17: qty 1

## 2021-07-17 MED ORDER — SODIUM CHLORIDE FLUSH 0.9 % IV SOLN
INTRAVENOUS | Status: DC | PRN
  Administered 2021-07-17: 10 mL

## 2021-07-17 MED ORDER — PHENOL 1.4 % MT LIQD: 1.0000 | OROMUCOSAL | Status: DC | PRN

## 2021-07-17 MED ORDER — POVIDONE-IODINE 10 % EX SWAB: 2.0000 "application " | Freq: Once | CUTANEOUS | Status: DC

## 2021-07-17 MED ORDER — PROPOFOL 10 MG/ML IV BOLUS
INTRAVENOUS | Status: DC | PRN
  Administered 2021-07-17: 40 mg via INTRAVENOUS

## 2021-07-17 MED ORDER — ACETAMINOPHEN 10 MG/ML IV SOLN: 1000.0000 mg | Freq: Once | INTRAVENOUS | Status: DC | PRN

## 2021-07-17 MED ORDER — BUDESON-GLYCOPYRROL-FORMOTEROL 160-9-4.8 MCG/ACT IN AERO: 2.0000 | INHALATION_SPRAY | Freq: Two times a day (BID) | RESPIRATORY_TRACT | Status: DC

## 2021-07-17 MED ORDER — PROPOFOL 500 MG/50ML IV EMUL
INTRAVENOUS | Status: DC | PRN
  Administered 2021-07-17: 125 ug/kg/min via INTRAVENOUS

## 2021-07-17 MED ORDER — ONDANSETRON HCL 4 MG/2ML IJ SOLN
INTRAMUSCULAR | Status: AC
  Filled 2021-07-17: qty 2

## 2021-07-17 MED ORDER — LORAZEPAM 0.5 MG PO TABS
0.5000 mg | ORAL_TABLET | Freq: Every day | ORAL | Status: DC
  Administered 2021-07-17: 0.5 mg via ORAL
  Filled 2021-07-17: qty 1

## 2021-07-17 MED ORDER — ONDANSETRON HCL 4 MG/2ML IJ SOLN
INTRAMUSCULAR | Status: DC | PRN
Start: 2021-07-17 — End: 2021-07-17
  Administered 2021-07-17: 4 mg via INTRAVENOUS

## 2021-07-17 MED ORDER — ONDANSETRON HCL 4 MG/2ML IJ SOLN: 4.0000 mg | Freq: Once | INTRAMUSCULAR | Status: DC | PRN

## 2021-07-17 MED ORDER — PROPOFOL 1000 MG/100ML IV EMUL
INTRAVENOUS | Status: AC
  Filled 2021-07-17: qty 100

## 2021-07-17 MED ORDER — HYDROMORPHONE HCL 1 MG/ML IJ SOLN: 0.5000 mg | INTRAMUSCULAR | Status: DC | PRN

## 2021-07-17 MED ORDER — CEFAZOLIN SODIUM-DEXTROSE 2-4 GM/100ML-% IV SOLN
2.0000 g | INTRAVENOUS | Status: AC
  Administered 2021-07-17: 2 g via INTRAVENOUS
  Filled 2021-07-17: qty 100

## 2021-07-17 MED ORDER — ASPIRIN 81 MG PO CHEW
81.0000 mg | CHEWABLE_TABLET | Freq: Two times a day (BID) | ORAL | Status: DC
  Administered 2021-07-17 – 2021-07-18 (×2): 81 mg via ORAL
  Filled 2021-07-17 (×2): qty 1

## 2021-07-17 MED ORDER — GABAPENTIN 300 MG PO CAPS
300.0000 mg | ORAL_CAPSULE | Freq: Two times a day (BID) | ORAL | Status: DC
  Administered 2021-07-17: 300 mg via ORAL
  Filled 2021-07-17 (×2): qty 1

## 2021-07-17 MED ORDER — PRAVASTATIN SODIUM 20 MG PO TABS
10.0000 mg | ORAL_TABLET | Freq: Every day | ORAL | Status: DC
  Administered 2021-07-17: 10 mg via ORAL
  Filled 2021-07-17: qty 1

## 2021-07-17 MED ORDER — ALUM & MAG HYDROXIDE-SIMETH 200-200-20 MG/5ML PO SUSP: 30.0000 mL | ORAL | Status: DC | PRN

## 2021-07-17 MED ORDER — AMLODIPINE BESYLATE 5 MG PO TABS
5.0000 mg | ORAL_TABLET | Freq: Every day | ORAL | Status: DC
  Administered 2021-07-17: 5 mg via ORAL
  Filled 2021-07-17: qty 1

## 2021-07-17 MED ORDER — MIDAZOLAM HCL 2 MG/2ML IJ SOLN
INTRAMUSCULAR | Status: AC
  Filled 2021-07-17: qty 2

## 2021-07-17 MED ORDER — PANTOPRAZOLE SODIUM 40 MG PO TBEC
40.0000 mg | DELAYED_RELEASE_TABLET | Freq: Two times a day (BID) | ORAL | Status: DC
  Administered 2021-07-17 – 2021-07-18 (×2): 40 mg via ORAL
  Filled 2021-07-17 (×2): qty 1

## 2021-07-17 MED ORDER — ONDANSETRON HCL 4 MG/2ML IJ SOLN
4.0000 mg | Freq: Four times a day (QID) | INTRAMUSCULAR | Status: DC | PRN
  Administered 2021-07-17: 4 mg via INTRAVENOUS
  Filled 2021-07-17: qty 2

## 2021-07-17 MED ORDER — BUPIVACAINE LIPOSOME 1.3 % IJ SUSP
INTRAMUSCULAR | Status: DC | PRN
  Administered 2021-07-17: 20 mL

## 2021-07-17 MED ORDER — HYDROCHLOROTHIAZIDE 25 MG PO TABS
25.0000 mg | ORAL_TABLET | Freq: Every day | ORAL | Status: DC
  Administered 2021-07-18: 25 mg via ORAL
  Filled 2021-07-17: qty 1

## 2021-07-17 MED ORDER — ARFORMOTEROL TARTRATE 15 MCG/2ML IN NEBU
15.0000 ug | INHALATION_SOLUTION | Freq: Two times a day (BID) | RESPIRATORY_TRACT | Status: DC
  Administered 2021-07-17 – 2021-07-18 (×2): 15 ug via RESPIRATORY_TRACT
  Filled 2021-07-17 (×2): qty 2

## 2021-07-17 MED ORDER — ONDANSETRON HCL 4 MG PO TABS: 4.0000 mg | ORAL_TABLET | Freq: Four times a day (QID) | ORAL | Status: DC | PRN

## 2021-07-17 MED ORDER — POLYETHYLENE GLYCOL 3350 17 G PO PACK: 17.0000 g | PACK | Freq: Every day | ORAL | Status: DC | PRN

## 2021-07-17 MED ORDER — INSULIN ASPART 100 UNIT/ML IJ SOLN
2.0000 [IU] | Freq: Three times a day (TID) | INTRAMUSCULAR | Status: DC
  Administered 2021-07-18: 2 [IU] via SUBCUTANEOUS

## 2021-07-17 MED ORDER — MIDAZOLAM HCL 2 MG/2ML IJ SOLN
INTRAMUSCULAR | Status: DC | PRN
  Administered 2021-07-17: 2 mg via INTRAVENOUS

## 2021-07-17 MED ORDER — DIPHENHYDRAMINE HCL 12.5 MG/5ML PO ELIX: 12.5000 mg | ORAL_SOLUTION | ORAL | Status: DC | PRN

## 2021-07-17 MED ORDER — IRBESARTAN 150 MG PO TABS
300.0000 mg | ORAL_TABLET | Freq: Every day | ORAL | Status: DC
Start: 2021-07-17 — End: 2021-07-18
  Administered 2021-07-17 – 2021-07-18 (×2): 300 mg via ORAL
  Filled 2021-07-17 (×2): qty 2

## 2021-07-17 MED ORDER — OXYCODONE HCL 5 MG PO TABS
5.0000 mg | ORAL_TABLET | ORAL | Status: DC | PRN
  Administered 2021-07-17: 5 mg via ORAL
  Administered 2021-07-17 – 2021-07-18 (×3): 10 mg via ORAL
  Filled 2021-07-17: qty 1
  Filled 2021-07-17 (×2): qty 2

## 2021-07-17 MED ORDER — CARVEDILOL 25 MG PO TABS
25.0000 mg | ORAL_TABLET | Freq: Two times a day (BID) | ORAL | Status: DC
  Administered 2021-07-17 – 2021-07-18 (×2): 25 mg via ORAL
  Filled 2021-07-17 (×2): qty 1

## 2021-07-17 MED ORDER — MENTHOL 3 MG MT LOZG: 1.0000 | LOZENGE | OROMUCOSAL | Status: DC | PRN

## 2021-07-17 MED ORDER — PHENYLEPHRINE HCL (PRESSORS) 10 MG/ML IV SOLN
INTRAVENOUS | Status: AC
  Filled 2021-07-17: qty 1

## 2021-07-17 MED ORDER — PHENYLEPHRINE HCL-NACL 20-0.9 MG/250ML-% IV SOLN
INTRAVENOUS | Status: DC | PRN
  Administered 2021-07-17: 30 ug/min via INTRAVENOUS

## 2021-07-17 MED ORDER — BISACODYL 10 MG RE SUPP
10.0000 mg | Freq: Every day | RECTAL | Status: DC | PRN
Start: 2021-07-17 — End: 2021-07-18

## 2021-07-17 MED ORDER — METOCLOPRAMIDE HCL 5 MG/ML IJ SOLN: 5.0000 mg | Freq: Three times a day (TID) | INTRAMUSCULAR | Status: DC | PRN

## 2021-07-17 MED ORDER — CEFAZOLIN SODIUM-DEXTROSE 1-4 GM/50ML-% IV SOLN
1.0000 g | Freq: Four times a day (QID) | INTRAVENOUS | Status: AC
  Administered 2021-07-17 – 2021-07-18 (×2): 1 g via INTRAVENOUS
  Filled 2021-07-17 (×2): qty 50

## 2021-07-17 MED ORDER — FENTANYL CITRATE (PF) 100 MCG/2ML IJ SOLN
INTRAMUSCULAR | Status: DC | PRN
  Administered 2021-07-17 (×2): 50 ug via INTRAVENOUS

## 2021-07-17 MED ORDER — INSULIN GLARGINE-YFGN 100 UNIT/ML ~~LOC~~ SOLN
40.0000 [IU] | Freq: Every day | SUBCUTANEOUS | Status: DC
  Administered 2021-07-18: 40 [IU] via SUBCUTANEOUS
  Filled 2021-07-17: qty 0.4

## 2021-07-17 MED ORDER — ALBUTEROL SULFATE (2.5 MG/3ML) 0.083% IN NEBU: 3.0000 mL | INHALATION_SOLUTION | RESPIRATORY_TRACT | Status: DC | PRN

## 2021-07-17 MED ORDER — POVIDONE-IODINE 10 % EX SWAB
2.0000 "application " | Freq: Once | CUTANEOUS | Status: AC
  Administered 2021-07-17: 2 via TOPICAL

## 2021-07-17 MED ORDER — ACETAMINOPHEN 500 MG PO TABS
1000.0000 mg | ORAL_TABLET | Freq: Once | ORAL | Status: DC
  Filled 2021-07-17: qty 2

## 2021-07-17 MED ORDER — CHLORHEXIDINE GLUCONATE 0.12 % MT SOLN
15.0000 mL | Freq: Once | OROMUCOSAL | Status: AC
  Administered 2021-07-17: 15 mL via OROMUCOSAL

## 2021-07-17 MED ORDER — DEXAMETHASONE SODIUM PHOSPHATE 10 MG/ML IJ SOLN
10.0000 mg | Freq: Once | INTRAMUSCULAR | Status: DC
  Filled 2021-07-17: qty 1

## 2021-07-17 MED ORDER — TRANEXAMIC ACID-NACL 1000-0.7 MG/100ML-% IV SOLN
1000.0000 mg | INTRAVENOUS | Status: AC
  Administered 2021-07-17: 1000 mg via INTRAVENOUS
  Filled 2021-07-17: qty 100

## 2021-07-17 MED ORDER — FENTANYL CITRATE (PF) 100 MCG/2ML IJ SOLN
INTRAMUSCULAR | Status: AC
  Filled 2021-07-17: qty 2

## 2021-07-17 MED ORDER — 0.9 % SODIUM CHLORIDE (POUR BTL) OPTIME
TOPICAL | Status: DC | PRN
  Administered 2021-07-17: 1000 mL

## 2021-07-17 MED ORDER — BUDESONIDE 0.25 MG/2ML IN SUSP
0.2500 mg | Freq: Two times a day (BID) | RESPIRATORY_TRACT | Status: DC
  Administered 2021-07-17 – 2021-07-18 (×2): 0.25 mg via RESPIRATORY_TRACT
  Filled 2021-07-17 (×2): qty 2

## 2021-07-17 MED ORDER — ACETAMINOPHEN 500 MG PO TABS
1000.0000 mg | ORAL_TABLET | Freq: Four times a day (QID) | ORAL | Status: DC
  Administered 2021-07-17 – 2021-07-18 (×3): 1000 mg via ORAL
  Filled 2021-07-17 (×3): qty 2

## 2021-07-17 MED ORDER — DEXAMETHASONE SODIUM PHOSPHATE 10 MG/ML IJ SOLN
8.0000 mg | Freq: Once | INTRAMUSCULAR | Status: AC
  Administered 2021-07-17: 8 mg via INTRAVENOUS

## 2021-07-17 MED ORDER — METOCLOPRAMIDE HCL 5 MG PO TABS
5.0000 mg | ORAL_TABLET | Freq: Three times a day (TID) | ORAL | Status: DC | PRN
  Filled 2021-07-17: qty 2

## 2021-07-17 MED ORDER — TRAMADOL HCL 50 MG PO TABS
50.0000 mg | ORAL_TABLET | Freq: Four times a day (QID) | ORAL | Status: DC
  Administered 2021-07-18 (×2): 50 mg via ORAL
  Filled 2021-07-17 (×2): qty 1

## 2021-07-17 MED ORDER — ORAL CARE MOUTH RINSE: 15.0000 mL | Freq: Once | OROMUCOSAL | Status: AC

## 2021-07-17 MED ORDER — IPRATROPIUM-ALBUTEROL 0.5-2.5 (3) MG/3ML IN SOLN: 3.0000 mL | RESPIRATORY_TRACT | Status: DC | PRN

## 2021-07-17 MED ORDER — LACTATED RINGERS IV SOLN: INTRAVENOUS | Status: DC

## 2021-07-17 MED ORDER — BUPIVACAINE IN DEXTROSE 0.75-8.25 % IT SOLN
INTRATHECAL | Status: DC | PRN
Start: 2021-07-17 — End: 2021-07-17
  Administered 2021-07-17: 1.6 mL via INTRATHECAL

## 2021-07-17 MED ORDER — HYDROMORPHONE HCL 1 MG/ML IJ SOLN: 0.2500 mg | INTRAMUSCULAR | Status: DC | PRN

## 2021-07-17 MED ORDER — FAMOTIDINE 20 MG PO TABS
20.0000 mg | ORAL_TABLET | Freq: Every day | ORAL | Status: DC
  Administered 2021-07-17: 20 mg via ORAL
  Filled 2021-07-17: qty 1

## 2021-07-17 MED ORDER — ACETAMINOPHEN 325 MG PO TABS: 325.0000 mg | ORAL_TABLET | Freq: Four times a day (QID) | ORAL | Status: DC | PRN

## 2021-07-17 MED ORDER — METHOCARBAMOL 500 MG PO TABS
500.0000 mg | ORAL_TABLET | Freq: Four times a day (QID) | ORAL | Status: DC | PRN
Start: 2021-07-17 — End: 2021-07-18
  Administered 2021-07-17: 500 mg via ORAL
  Filled 2021-07-17: qty 1

## 2021-07-17 MED ORDER — METFORMIN HCL ER 500 MG PO TB24
2000.0000 mg | ORAL_TABLET | Freq: Every day | ORAL | Status: DC
  Administered 2021-07-18: 2000 mg via ORAL
  Filled 2021-07-17: qty 4

## 2021-07-17 MED ORDER — BUPIVACAINE LIPOSOME 1.3 % IJ SUSP: 10.0000 mL | Freq: Once | INTRAMUSCULAR | Status: DC

## 2021-07-17 MED ORDER — METHOCARBAMOL 500 MG IVPB - SIMPLE MED
500.0000 mg | Freq: Four times a day (QID) | INTRAVENOUS | Status: DC | PRN
  Filled 2021-07-17: qty 50

## 2021-07-17 SURGICAL SUPPLY — 49 items
APL PRP STRL LF DISP 70% ISPRP (MISCELLANEOUS) ×1
BAG COUNTER SPONGE SURGICOUNT (BAG) IMPLANT
BAG SPEC THK2 15X12 ZIP CLS (MISCELLANEOUS)
BAG SPNG CNTER NS LX DISP (BAG)
BAG SURGICOUNT SPONGE COUNTING (BAG)
BAG ZIPLOCK 12X15 (MISCELLANEOUS) IMPLANT
BLADE SAG 18X100X1.27 (BLADE) ×3 IMPLANT
BLADE SURG SZ10 CARB STEEL (BLADE) ×3 IMPLANT
CHLORAPREP W/TINT 26 (MISCELLANEOUS) ×3 IMPLANT
CLOSURE STERI-STRIP 1/2X4 (GAUZE/BANDAGES/DRESSINGS) ×1
CLSR STERI-STRIP ANTIMIC 1/2X4 (GAUZE/BANDAGES/DRESSINGS) ×2 IMPLANT
COVER PERINEAL POST (MISCELLANEOUS) ×3 IMPLANT
COVER SURGICAL LIGHT HANDLE (MISCELLANEOUS) ×3 IMPLANT
DRAPE IMP U-DRAPE 54X76 (DRAPES) ×3 IMPLANT
DRAPE STERI IOBAN 125X83 (DRAPES) ×3 IMPLANT
DRAPE U-SHAPE 47X51 STRL (DRAPES) ×6 IMPLANT
DRSG MEPILEX BORDER 4X8 (GAUZE/BANDAGES/DRESSINGS) ×3 IMPLANT
ELECT REM PT RETURN 15FT ADLT (MISCELLANEOUS) ×3 IMPLANT
GLOVE SRG 8 PF TXTR STRL LF DI (GLOVE) ×1 IMPLANT
GLOVE SURG ENC MOIS LTX SZ7.5 (GLOVE) ×3 IMPLANT
GLOVE SURG POLYISO LF SZ7.5 (GLOVE) ×3 IMPLANT
GLOVE SURG SYN 7.5  E (GLOVE) ×3
GLOVE SURG SYN 7.5 E (GLOVE) ×1 IMPLANT
GLOVE SURG SYN 7.5 PF PI (GLOVE) ×1 IMPLANT
GLOVE SURG UNDER POLY LF SZ7.5 (GLOVE) ×3 IMPLANT
GLOVE SURG UNDER POLY LF SZ8 (GLOVE) ×3
GOWN STRL REUS W/TWL LRG LVL3 (GOWN DISPOSABLE) ×3 IMPLANT
GOWN STRL REUS W/TWL XL LVL3 (GOWN DISPOSABLE) ×3 IMPLANT
HEAD CERAMIC FEMORAL 36MM (Head) ×2 IMPLANT
HOLDER FOLEY CATH W/STRAP (MISCELLANEOUS) IMPLANT
INSERT 0 DEGREE 36 (Miscellaneous) ×2 IMPLANT
KIT TURNOVER KIT A (KITS) IMPLANT
MANIFOLD NEPTUNE II (INSTRUMENTS) ×3 IMPLANT
NS IRRIG 1000ML POUR BTL (IV SOLUTION) ×3 IMPLANT
PACK ANTERIOR HIP CUSTOM (KITS) ×3 IMPLANT
PROTECTOR NERVE ULNAR (MISCELLANEOUS) ×3 IMPLANT
SCREW HEX LP 6.5X20 (Screw) ×2 IMPLANT
SHELL ACETAB TRIDENT 48 (Shell) ×2 IMPLANT
SPIKE FLUID TRANSFER (MISCELLANEOUS) ×6 IMPLANT
SPONGE T-LAP 18X18 ~~LOC~~+RFID (SPONGE) ×7 IMPLANT
STEM HIP 127 DEG (Stem) ×2 IMPLANT
SUT MNCRL AB 3-0 PS2 18 (SUTURE) ×3 IMPLANT
SUT VIC AB 0 CT1 36 (SUTURE) ×3 IMPLANT
SUT VIC AB 1 CT1 36 (SUTURE) ×3 IMPLANT
SUT VIC AB 2-0 CT1 27 (SUTURE) ×6
SUT VIC AB 2-0 CT1 TAPERPNT 27 (SUTURE) ×2 IMPLANT
TRAY FOLEY MTR SLVR 16FR STAT (SET/KITS/TRAYS/PACK) IMPLANT
TUBE SUCTION HIGH CAP CLEAR NV (SUCTIONS) ×3 IMPLANT
WATER STERILE IRR 1000ML POUR (IV SOLUTION) ×6 IMPLANT

## 2021-07-17 NOTE — Anesthesia Procedure Notes (Signed)
Spinal ? ?Patient location during procedure: OR ?Start time: 07/17/2021 12:50 PM ?End time: 07/17/2021 12:54 PM ?Reason for block: surgical anesthesia ?Staffing ?Performed: anesthesiologist  ?Anesthesiologist: Myrtie Soman, MD ?Preanesthetic Checklist ?Completed: patient identified, IV checked, site marked, risks and benefits discussed, surgical consent, monitors and equipment checked, pre-op evaluation and timeout performed ?Spinal Block ?Patient position: sitting ?Prep: Betadine ?Patient monitoring: heart rate, continuous pulse ox and blood pressure ?Approach: midline ?Location: L3-4 ?Injection technique: single-shot ?Needle ?Needle type: Sprotte  ?Needle gauge: 24 G ?Needle length: 9 cm ?Assessment ?Sensory level: T6 ?Events: CSF return ?Additional Notes ? ? ? ? ? ? ?

## 2021-07-17 NOTE — Op Note (Signed)
07/17/2021 ? ?2:05 PM ? ?PATIENT:  Kathy Tran  ? ?MRN: [id number] ? ?PRE-OPERATIVE DIAGNOSIS:  OSTEOARTHRITIS RIGHT HIP ? ?POST-OPERATIVE DIAGNOSIS:  OSTEOARTHRITIS RIGHT HIP ? ?PROCEDURE:  Procedure(s): ?RIGHT TOTAL HIP ARTHROPLASTY ANTERIOR APPROACH ? ?PREOPERATIVE INDICATIONS:   ? ?Kathy Tran is an 66 y.o. female who has a diagnosis of <principal problem not specified> and elected for surgical management after failing conservative treatment.  The risks benefits and alternatives were discussed with the patient including but not limited to the risks of nonoperative treatment, versus surgical intervention including infection, bleeding, nerve injury, periprosthetic fracture, the need for revision surgery, dislocation, leg length discrepancy, blood clots, cardiopulmonary complications, morbidity, mortality, among others, and they were willing to proceed.   ? ? ?OPERATIVE REPORT  ?   ?SURGEON:   [name], MD ?   ?ASSISTANT:  Aggie Moats, PA-C, he was present and scrubbed throughout the case, critical for completion in a timely fashion, and for retraction, instrumentation, and closure. ? ?   ?ANESTHESIA:  General ?   ?COMPLICATIONS:  None.  ?   ?COMPONENTS:  Stryker acolade fit femur size 5 with a 36 mm -0 head ball and an acetabular shell size 48 with a  polyethylene liner ?   ?PROCEDURE IN DETAIL:  ? ?The patient was met in the holding area and  identified.  The appropriate hip was identified and marked at the operative site.  The patient was then transported to the OR  and  placed under anesthesia per that record.  At that point, the patient was  placed in the supine position and  secured to the operating room table and all bony prominences padded. He received pre-operative antibiotics ?   ?The operative lower extremity was prepped from the iliac crest to the distal leg.  Sterile draping was performed.  Time out was performed prior to incision.   ?   ?Skin incision was made just 2 cm lateral to the ASIS   extending in line with the tensor fascia lata. Electrocautery was used to control all bleeders. I dissected down sharply to the fascia of the tensor fascia lata was confirmed that the muscle fibers beneath were running posteriorly. I then incised the fascia over the superficial tensor fascia lata in line with the incision. The fascia was elevated off the anterior aspect of the muscle the muscle was retracted posteriorly and protected throughout the case. I then used electrocautery to incise the tensor fascia lata fascia control and all bleeders. Immediately visible was the fat over top of the anterior neck and capsule. ? ?I removed the anterior fat from the capsule and elevated the rectus muscle off of the anterior capsule. I then removed a large time of capsule. The retractors were then placed over the anterior acetabulum as well as around the superior and inferior neck. ? ?I then made a femoral neck cut. Then used the power corkscrew to remove the femoral head from the acetabulum and thoroughly irrigated the acetabulum. I sized the femoral head. ?   ?I then exposed the deep acetabulum, cleared out any tissue including the ligamentum teres.   After adequate visualization, I excised the labrum, and then sequentially reamed.  I then impacted the acetabular implant into place using fluoroscopy for guidance.  Appropriate version and inclination was confirmed clinically matching their bony anatomy, and with fluoroscopy. ? ?I placed a 20 mm screw in the posterior/superio position with an excellent bite.  ? ? I then placed the polyethylene liner  in place ? ?I then adducted the leg and released the external rotators from the posterior femur allowing it to be easily delivered up lateral and anterior to the acetabulum for preparation of the femoral canal. ?   ?I then prepared the proximal femur using the cookie-cutter and then sequentially reamed and broached. ? ?A trial broach, neck, and head was utilized, and I reduced the  hip and used floroscopy to assess the neck length and femoral implant. ? ?I then impacted the femoral prosthesis into place into the appropriate version. The hip was then reduced and fluoroscopy confirmed appropriate position. Leg lengths were restored. ? ?I then irrigated the hip copiously again with, and repaired the fascia with Vicryl, followed by monocryl for the subcutaneous tissue, Monocryl for the skin, Steri-Strips and sterile gauze. The patient was then awakened and returned to PACU in stable and satisfactory condition. There were no complications. ? ?POST OPERATIVE PLAN: WBAT, DVT px: SCD's/TED, ambulation and chemical dvt px ? ?Edmonia Lynch, MD ?Orthopedic Surgeon ?[phone]  ? ? ? ?

## 2021-07-17 NOTE — Discharge Instructions (Signed)

## 2021-07-17 NOTE — Transfer of Care (Signed)
Immediate Anesthesia Transfer of Care Note ? ?Patient: Kathy Tran ? ?Procedure(s) Performed: RIGHT TOTAL HIP ARTHROPLASTY ANTERIOR APPROACH (Right: Hip) ? ?Patient Location: PACU ? ?Anesthesia Type:MAC and Spinal ? ?Level of Consciousness: awake, alert  and oriented ? ?Airway & Oxygen Therapy: Patient Spontanous Breathing and Patient connected to face mask oxygen ? ?Post-op Assessment: Report given to RN and Post -op Vital signs reviewed and stable ? ?Post vital signs: Reviewed and stable ? ?Last Vitals:  ?Vitals Value Taken Time  ?BP 94/55 07/17/21 1439  ?Temp    ?Pulse 72 07/17/21 1440  ?Resp 12 07/17/21 1440  ?SpO2 100 % 07/17/21 1440  ?Vitals shown include unvalidated device data. ? ?Last Pain:  ?Vitals:  ? 07/17/21 1016  ?PainSc: 7   ?   ? ?Patients Stated Pain Goal: 4 (07/17/21 1016) ? ?Complications: No notable events documented. ?

## 2021-07-17 NOTE — Interval H&P Note (Signed)
History and Physical Interval Note: ? ?07/17/2021 ?11:50 AM ? ?Kathy Tran  has presented today for surgery, with the diagnosis of OSTEOARTHRITIS RIGHT HIP.  The various methods of treatment have been discussed with the patient and family. After consideration of risks, benefits and other options for treatment, the patient has consented to  Procedure(s): ?RIGHT TOTAL HIP ARTHROPLASTY ANTERIOR APPROACH (Right) as a surgical intervention.  The patient's history has been reviewed, patient examined, no change in status, stable for surgery.  I have reviewed the patient's chart and labs.  Questions were answered to the patient's satisfaction.   ? ? ?[name] ? ? ?

## 2021-07-17 NOTE — Anesthesia Preprocedure Evaluation (Signed)
Anesthesia Evaluation  ?Patient identified by MRN, date of birth, ID band ?Patient awake ? ? ? ?Reviewed: ?Allergy & Precautions, NPO status , Patient's Chart, lab work & pertinent test results ? ?Airway ?Mallampati: III ? ?TM Distance: <3 FB ?Neck ROM: Full ? ? ? Dental ?no notable dental hx. ? ?  ?Pulmonary ?COPD,  COPD inhaler,  ?  ?Pulmonary exam normal ?breath sounds clear to auscultation ? ? ? ? ? ? Cardiovascular ?hypertension, Pt. on medications ?Normal cardiovascular exam ?Rhythm:Regular Rate:Normal ? ? ?  ?Neuro/Psych ?negative neurological ROS ? negative psych ROS  ? GI/Hepatic ?Neg liver ROS, GERD  ,  ?Endo/Other  ?diabetes, Insulin Dependent ? Renal/GU ?negative Renal ROS  ?negative genitourinary ?  ?Musculoskeletal ?negative musculoskeletal ROS ?(+)  ? Abdominal ?  ?Peds ?negative pediatric ROS ?(+)  Hematology ?negative hematology ROS ?(+)   ?Anesthesia Other Findings ? ? Reproductive/Obstetrics ?negative OB ROS ? ?  ? ? ? ? ? ? ? ? ? ? ? ? ? ?  ?  ? ? ? ? ? ? ? ? ?Anesthesia Physical ?Anesthesia Plan ? ?ASA: 3 ? ?Anesthesia Plan: Spinal  ? ?Post-op Pain Management: Regional block*  ? ?Induction: Intravenous ? ?PONV Risk Score and Plan: 2 and Ondansetron and Propofol infusion ? ?Airway Management Planned: Simple Face Mask ? ?Additional Equipment:  ? ?Intra-op Plan:  ? ?Post-operative Plan:  ? ?Informed Consent: I have reviewed the patients History and Physical, chart, labs and discussed the procedure including the risks, benefits and alternatives for the proposed anesthesia with the patient or authorized representative who has indicated his/her understanding and acceptance.  ? ? ? ?Dental advisory given ? ?Plan Discussed with: CRNA and Surgeon ? ?Anesthesia Plan Comments:   ? ? ? ? ? ? ?Anesthesia Quick Evaluation ? ?

## 2021-07-18 ENCOUNTER — Encounter (HOSPITAL_COMMUNITY): Payer: Self-pay | Admitting: Orthopedic Surgery

## 2021-07-18 DIAGNOSIS — Z794 Long term (current) use of insulin: Secondary | ICD-10-CM | POA: Diagnosis not present

## 2021-07-18 DIAGNOSIS — I10 Essential (primary) hypertension: Secondary | ICD-10-CM | POA: Diagnosis not present

## 2021-07-18 DIAGNOSIS — E119 Type 2 diabetes mellitus without complications: Secondary | ICD-10-CM | POA: Diagnosis not present

## 2021-07-18 DIAGNOSIS — M1611 Unilateral primary osteoarthritis, right hip: Secondary | ICD-10-CM | POA: Diagnosis not present

## 2021-07-18 DIAGNOSIS — J449 Chronic obstructive pulmonary disease, unspecified: Secondary | ICD-10-CM | POA: Diagnosis not present

## 2021-07-18 DIAGNOSIS — Z20822 Contact with and (suspected) exposure to covid-19: Secondary | ICD-10-CM | POA: Diagnosis not present

## 2021-07-18 DIAGNOSIS — Z7984 Long term (current) use of oral hypoglycemic drugs: Secondary | ICD-10-CM | POA: Diagnosis not present

## 2021-07-18 DIAGNOSIS — Z7951 Long term (current) use of inhaled steroids: Secondary | ICD-10-CM | POA: Diagnosis not present

## 2021-07-18 DIAGNOSIS — J45909 Unspecified asthma, uncomplicated: Secondary | ICD-10-CM | POA: Diagnosis not present

## 2021-07-18 LAB — GLUCOSE, CAPILLARY: Glucose-Capillary: 188 mg/dL — ABNORMAL HIGH (ref 70–99)

## 2021-07-18 MED ORDER — ONDANSETRON 4 MG PO TBDP
4.0000 mg | ORAL_TABLET | Freq: Two times a day (BID) | ORAL | 0 refills | Status: DC | PRN
Start: 2021-07-18 — End: 2021-10-12

## 2021-07-18 MED ORDER — DOCUSATE SODIUM 100 MG PO CAPS: 100.0000 mg | ORAL_CAPSULE | Freq: Two times a day (BID) | ORAL | 0 refills | Status: DC | PRN

## 2021-07-18 MED ORDER — METHOCARBAMOL 750 MG PO TABS: 750.0000 mg | ORAL_TABLET | Freq: Three times a day (TID) | ORAL | 0 refills | Status: DC | PRN

## 2021-07-18 MED ORDER — ASPIRIN EC 81 MG PO TBEC: 81.0000 mg | DELAYED_RELEASE_TABLET | Freq: Two times a day (BID) | ORAL | 0 refills | Status: DC

## 2021-07-18 MED ORDER — ACETAMINOPHEN 500 MG PO TABS: 1000.0000 mg | ORAL_TABLET | Freq: Four times a day (QID) | ORAL | 0 refills | Status: DC | PRN

## 2021-07-18 MED ORDER — OXYCODONE HCL 5 MG PO TABS: 5.0000 mg | ORAL_TABLET | Freq: Four times a day (QID) | ORAL | 0 refills | Status: DC | PRN

## 2021-07-18 NOTE — Anesthesia Postprocedure Evaluation (Signed)
Anesthesia Post Note ? ?Patient: Kathy Tran ? ?Procedure(s) Performed: RIGHT TOTAL HIP ARTHROPLASTY ANTERIOR APPROACH (Right: Hip) ? ?  ? ?Patient location during evaluation: PACU ?Anesthesia Type: Spinal ?Level of consciousness: oriented and awake and alert ?Pain management: pain level controlled ?Vital Signs Assessment: post-procedure vital signs reviewed and stable ?Respiratory status: spontaneous breathing, respiratory function stable and patient connected to nasal cannula oxygen ?Cardiovascular status: blood pressure returned to baseline and stable ?Postop Assessment: no headache, no backache and no apparent nausea or vomiting ?Anesthetic complications: no ? ? ?No notable events documented. ? ?Last Vitals:  ?Vitals:  ? 07/18/21 0501 07/18/21 0845  ?BP: 105/63   ?Pulse: 69   ?Resp: 16   ?Temp: 36.4 ?C   ?SpO2: 95% 98%  ?  ?Last Pain:  ?Vitals:  ? 07/18/21 1000  ?TempSrc:   ?PainSc: 2   ? ? ?  ?  ?  ?  ?  ?  ? ?Arrick Dutton L Jaquanna Ballentine ? ? ? ? ?

## 2021-07-18 NOTE — Progress Notes (Signed)
? ? ?  Subjective: ?Patient reports pain as mild to moderate. Already feeling much better than before surgery. Tolerating diet.  Urinating.   No CP, SOB.  Mobilizing very well with PT OOB.  ? ?Objective:  ? ?VITALS:   ?Vitals:  ? 07/17/21 2029 07/18/21 0125 07/18/21 0501 07/18/21 0845  ?BP: 121/65 (!) 99/58 105/63   ?Pulse: 91 86 69   ?Resp: '17 16 16   '$ ?Temp: 97.9 ?F (36.6 ?C) (!) 97.5 ?F (36.4 ?C) 97.6 ?F (36.4 ?C)   ?TempSrc:   Oral   ?SpO2: 96% 97% 95% 98%  ?Weight:      ?Height:      ? ?CBC Latest Ref Rng & Units 07/04/2021 05/10/2021 04/13/2021  ?WBC 4.0 - 10.5 K/uL 5.1 5.5 4.9  ?Hemoglobin 12.0 - 15.0 g/dL 12.8 13.2 12.7  ?Hematocrit 36.0 - 46.0 % 38.7 40.4 38.1  ?Platelets 150 - 400 K/uL 215 229 -  ? ?BMP Latest Ref Rng & Units 07/04/2021 05/10/2021 04/13/2021  ?Glucose 70 - 99 mg/dL 107(H) 180(H) 185(H)  ?BUN 8 - 23 mg/dL '23 21 16  '$ ?Creatinine 0.44 - 1.00 mg/dL 0.67 0.80 0.78  ?BUN/Creat Ratio 12 - 28 - - 21  ?Sodium 135 - 145 mmol/L 139 137 142  ?Potassium 3.5 - 5.1 mmol/L 3.9 4.1 4.8  ?Chloride 98 - 111 mmol/L 107 104 102  ?CO2 22 - 32 mmol/L '25 27 26  '$ ?Calcium 8.9 - 10.3 mg/dL 9.1 9.2 9.7  ? ?Intake/Output   ?   03/07 0701 ?03/08 0700 03/08 0701 ?03/09 0700  ? P.O. 420   ? I.V. (mL/kg) 1600 (22.4)   ? IV Piggyback 300   ? Total Intake(mL/kg) 2320 (32.4)   ? Urine (mL/kg/hr) 3025 0 (0)  ? Stool  0  ? Blood 200   ? Total Output 3225 0  ? Net -905 0  ?     ?  ? ? ?Physical Exam: ?General: NAD.  Sitting in bedside chair, calm, comfortable ?Resp: No increased wob ?Cardio: regular rate and rhythm ?ABD soft ?Neurologically intact ?MSK ?Neurovascularly intact ?Sensation intact distally ?Intact pulses distally ?Dorsiflexion/Plantar flexion intact ?Incision: dressing C/D/I ? ? ?Assessment: ?1 Day Post-Op  ?S/P Procedure(s) (LRB): ?RIGHT TOTAL HIP ARTHROPLASTY ANTERIOR APPROACH (Right) ?by Dr. Ernesta Amble. Murphy on 07/17/21 ? ?Principal Problem: ?  S/P total right hip arthroplasty ? ? ?Plan:  ?Advance diet ?Up with  therapy ?Incentive Spirometry ?Elevate and Apply ice ? ?Weightbearing: WBAT RLE ?Insicional and dressing care: Dressings left intact until follow-up and Reinforce dressings as needed ?Orthopedic device(s): None ?Showering: Keep dressing dry ?VTE prophylaxis: Aspirin '81mg'$  BID  x 30 days , SCDs, ambulation ?Pain control: Continue current regimen ?Follow - up plan: 2 weeks ?Contact information:  Edmonia Lynch MD, Aggie Moats PA-C ? ?Dispo: Home today ? ? ? ?Britt Bottom, PA-C ?Office [phone] ?07/18/2021, 12:24 PM  ?

## 2021-07-18 NOTE — Evaluation (Signed)
Physical Therapy Evaluation ?Patient Details ?Name: Kathy Tran ?MRN: [id number] ?DOB: 10/20/1955 ?Today's Date: 07/18/2021 ? ?History of Present Illness ? 66 y.o. female admitted 07/17/21 for R AA-THA. PMH includes HTN, DM, COPD, asthma.  ?Clinical Impression ? Pt is mobilizing well, she ambulated 150' with RW, completed stair training, and demonstrates good understanding of THA HEP. She is ready to DC home from a PT standpoint. Pt reports she is scheduled for OPPT tomorrow.    ?   ? ?Recommendations for follow up therapy are one component of a multi-disciplinary discharge planning process, led by the attending physician.  Recommendations may be updated based on patient status, additional functional criteria and insurance authorization. ? ?Follow Up Recommendations Home health PT ? ?  ?Assistance Recommended at Discharge Set up Supervision/Assistance  ?Patient can return home with the following ? A little help with bathing/dressing/bathroom;Assistance with cooking/housework;Help with stairs or ramp for entrance;Assist for transportation ? ?  ?Equipment Recommendations None recommended by PT  ?Recommendations for Other Services ?    ?  ?Functional Status Assessment Patient has had a recent decline in their functional status and demonstrates the ability to make significant improvements in function in a reasonable and predictable amount of time.  ? ?  ?Precautions / Restrictions Precautions ?Precautions: Fall ?Precaution Comments: denies falls in past 6 months ?Restrictions ?Weight Bearing Restrictions: No ?RLE Weight Bearing: Weight bearing as tolerated  ? ?  ? ?Mobility ? Bed Mobility ?Overal bed mobility: Modified Independent ?  ?  ?  ?  ?  ?  ?General bed mobility comments: used bedrail ?  ? ?Transfers ?Overall transfer level: Needs assistance ?Equipment used: Rolling walker (2 wheels) ?Transfers: Sit to/from Stand ?  ?  ?  ?  ?  ?  ?General transfer comment: VCs hand placement and for RLE prior to sitting ?   ? ?Ambulation/Gait ?Ambulation/Gait assistance: Supervision ?Gait Distance (Feet): 150 Feet ?Assistive device: Rolling walker (2 wheels) ?Gait Pattern/deviations: Step-to pattern ?Gait velocity: WFL ?  ?  ?General Gait Details: VCs sequencing, no loss of balance ? ?Stairs ?Stairs: Yes ?Stairs assistance: Min guard ?Stair Management: One rail Left ?Number of Stairs: 1 ?General stair comments: husband present and assisted with advancing RW, VCs sequencing ? ?Wheelchair Mobility ?  ? ?Modified Rankin (Stroke Patients Only) ?  ? ?  ? ?Balance Overall balance assessment: Modified Independent ?  ?  ?  ?  ?  ?  ?  ?  ?  ?  ?  ?  ?  ?  ?  ?  ?  ?  ?   ? ? ? ?Pertinent Vitals/Pain Pain Assessment ?Pain Assessment: 0-10 ?Pain Score: 3  ?Pain Location: R hip with walking ?Pain Descriptors / Indicators: Sore ?Pain Intervention(s): Limited activity within patient's tolerance, Monitored during session, Premedicated before session, Ice applied  ? ? ?Home Living Family/patient expects to be discharged to:: (P) Private residence ?Living Arrangements: Spouse/significant other ?Available Help at Discharge: Family;Available 24 hours/day ?Type of Home: House ?Home Access: Stairs to enter ?Entrance Stairs-Rails: Left ?Entrance Stairs-Number of Steps: 1 ?  ?Home Layout: Able to live on main level with bedroom/bathroom;Two level ?Home Equipment: Conservation officer, nature (2 wheels);Cane - single point;Toilet riser ?   ?  ?Prior Function Prior Level of Function : Independent/Modified Independent ?  ?  ?  ?  ?  ?  ?Mobility Comments: walked with RW ?ADLs Comments: independent ?  ? ? ?Hand Dominance  ?   ? ?  ?Extremity/Trunk Assessment  ?  Upper Extremity Assessment ?Upper Extremity Assessment: Overall WFL for tasks assessed ?  ? ?Lower Extremity Assessment ?Lower Extremity Assessment: RLE deficits/detail ?RLE Deficits / Details: hip flexion to ~40* AAROM, limited by pain, hip ABDuction AAROM ~20*, knee ext at least 3/5 ?RLE Sensation: WNL ?RLE  Coordination: WNL ?  ? ?Cervical / Trunk Assessment ?Cervical / Trunk Assessment: Normal  ?Communication  ? Communication: No difficulties  ?Cognition Arousal/Alertness: Awake/alert ?Behavior During Therapy: [facility] for tasks assessed/performed ?Overall Cognitive Status: Within Functional Limits for tasks assessed ?  ?  ?  ?  ?  ?  ?  ?  ?  ?  ?  ?  ?  ?  ?  ?  ?  ?  ?  ? ?  ?General Comments   ? ?  ?Exercises Total Joint Exercises ?Ankle Circles/Pumps: AROM, Both, 10 reps, Supine ?Quad Sets: AROM, 5 reps, Both, Supine ?Short Arc Quad: AROM, Right, 5 reps, Supine ?Heel Slides: AAROM, 5 reps, Supine ?Hip ABduction/ADduction: AAROM, Right, 5 reps, Supine ?Long Arc Quad: AROM, Right, 5 reps, Seated  ? ?Assessment/Plan  ?  ?PT Assessment All further PT needs can be met in the next venue of care  ?PT Problem List Decreased activity tolerance;Decreased balance;Pain;Decreased knowledge of use of DME;Decreased mobility ? ?   ?  ?PT Treatment Interventions     ? ?PT Goals (Current goals can be found in the Care Plan section)  ?Acute Rehab PT Goals ?Patient Stated Goal: mowing, gardening ?PT Goal Formulation: All assessment and education complete, DC therapy ? ?  ?Frequency   ?  ? ? ?Co-evaluation   ?  ?  ?  ?  ? ? ?  ?AM-PAC PT "6 Clicks" Mobility  ?Outcome Measure Help needed turning from your back to your side while in a flat bed without using bedrails?: None ?Help needed moving from lying on your back to sitting on the side of a flat bed without using bedrails?: A Little ?Help needed moving to and from a bed to a chair (including a wheelchair)?: None ?Help needed standing up from a chair using your arms (e.g., wheelchair or bedside chair)?: None ?Help needed to walk in hospital room?: None ?Help needed climbing 3-5 steps with a railing? : A Little ?6 Click Score: 22 ? ?  ?End of Session Equipment Utilized During Treatment: Gait belt ?Activity Tolerance: Patient tolerated treatment well ?Patient left: in chair;with call  bell/phone within reach;with family/visitor present ?Nurse Communication: Mobility status ?PT Visit Diagnosis: Muscle weakness (generalized) (M62.81);Difficulty in walking, not elsewhere classified (R26.2);Pain ?Pain - Right/Left: Right ?Pain - part of body: Hip ?  ? ?Time: 4627-0350 ?PT Time Calculation (min) (ACUTE ONLY): 31 min ? ? ?Charges:   PT Evaluation ?$PT Eval Moderate Complexity: 1 Mod ?PT Treatments ?$Gait Training: 8-22 mins ?  ?   ? ? ?Blondell Reveal Kistler PT 07/18/2021  ?Acute Rehabilitation Services ?Pager [phone] ?Office [phone] ? ? ?

## 2021-07-18 NOTE — Discharge Summary (Signed)
Physician Discharge Summary  Patient ID: Kathy Tran MRN: [id number] DOB/AGE: 1955-11-19 66 y.o.  Admit date: 07/17/2021 Discharge date: 07/18/2021  Admission Diagnoses: right hip OA  Discharge Diagnoses:  Principal Problem:   S/P total right hip arthroplasty   Discharged Condition: fair  Hospital Course: Patient underwent a right THA by Dr. Percell Miller on 3/0/86 without complications. She spent the night in observation for pain control and mobility improvement. She has passed her evaluations and is ready to go home with OPPT already set up.  Consults: None  Significant Diagnostic Studies: n/a  Treatments: antibiotics: Ancef, analgesia: acetaminophen, acetaminophen w/ codeine, Vicodin, Dilaudid, and Morphine, anticoagulation: ASA, and surgery: right THA  Discharge Exam: Blood pressure 105/63, pulse 69, temperature 97.6 F (36.4 C), temperature source Oral, resp. rate 16, height 5' 2"  (1.575 m), weight 71.5 kg, SpO2 98 %. General appearance: alert, cooperative, and no distress Head: Normocephalic, without obvious abnormality, atraumatic Eyes: conjunctivae/corneas clear. PERRL, EOM's intact. Fundi benign. Resp: clear to auscultation bilaterally Cardio: regular rate and rhythm, S1, S2 normal, no murmur, click, rub or gallop GI: soft, non-tender; bowel sounds normal; no masses,  no organomegaly Extremities: extremities normal, atraumatic, no cyanosis or edema Pulses:  L brachial 2+ R brachial 2+  L radial 2+ R radial 2+  L inguinal 2+ R inguinal 2+  L popliteal 2+ R popliteal 2+  L posterior tibial 2+ R posterior tibial 2+  L dorsalis pedis 2+ R dorsalis pedis 2+   Neurologic: Alert and oriented X 3, normal strength and tone. Normal symmetric reflexes. Normal coordination and gait Incision/Wound: c/d/i  Disposition: Discharge disposition: 01-Home or Self Care       Discharge Instructions     Call MD / Call 911   Complete by: As directed    If you experience chest pain or  shortness of breath, CALL 911 and be transported to the hospital emergency room.  If you develope a fever above 101 F, pus (white drainage) or increased drainage or redness at the wound, or calf pain, call your surgeon's office.   Diet - low sodium heart healthy   Complete by: As directed    Discharge instructions   Complete by: As directed    You may bear weight as tolerated. Keep your dressing on and dry until follow up. Take medicine to prevent blood clots as directed. Take pain medicine as needed with the goal of transitioning to over the counter medicines.    INSTRUCTIONS AFTER JOINT REPLACEMENT   Remove items at home which could result in a fall. This includes throw rugs or furniture in walking pathways ICE to the affected joint every three hours while awake for 30 minutes at a time, for at least the first 3-5 days, and then as needed for pain and swelling.  Continue to use ice for pain and swelling. You may notice swelling that will progress down to the foot and ankle.  This is normal after surgery.  Elevate your leg when you are not up walking on it.   Continue to use the breathing machine you got in the hospital (incentive spirometer) which will help keep your temperature down.  It is common for your temperature to cycle up and down following surgery, especially at night when you are not up moving around and exerting yourself.  The breathing machine keeps your lungs expanded and your temperature down.   DIET:  As you were doing prior to hospitalization, we recommend a well-balanced diet.  DRESSING / WOUND  CARE / SHOWERING  You may shower 3 days after surgery, but keep the wounds dry during showering.  You may use an occlusive plastic wrap (Press'n Seal for example) with blue painter's tape at edges, NO SOAKING/SUBMERGING IN THE BATHTUB.  If the bandage gets wet, call the office.   ACTIVITY  Increase activity slowly as tolerated, but follow the weight bearing instructions below.    No driving for 6 weeks or until further direction given by your physician.  You cannot drive while taking narcotics.  No lifting or carrying greater than 10 lbs. until further directed by your surgeon. Avoid periods of inactivity such as sitting longer than an hour when not asleep. This helps prevent blood clots.  You may return to work once you are authorized by your doctor.    WEIGHT BEARING   Weight bearing as tolerated with assist device (walker, cane, etc) as directed, use it as long as suggested by your surgeon or therapist, typically at least 4-6 weeks.   EXERCISES  Results after joint replacement surgery are often greatly improved when you follow the exercise, range of motion and muscle strengthening exercises prescribed by your doctor. Safety measures are also important to protect the joint from further injury. Any time any of these exercises cause you to have increased pain or swelling, decrease what you are doing until you are comfortable again and then slowly increase them. If you have problems or questions, call your caregiver or physical therapist for advice.   Rehabilitation is important following a joint replacement. After just a few days of immobilization, the muscles of the leg can become weakened and shrink (atrophy).  These exercises are designed to build up the tone and strength of the thigh and leg muscles and to improve motion. Often times heat used for twenty to thirty minutes before working out will loosen up your tissues and help with improving the range of motion but do not use heat for the first two weeks following surgery (sometimes heat can increase post-operative swelling).   These exercises can be done on a training (exercise) mat, on the floor, on a table or on a bed. Use whatever works the best and is most comfortable for you.    Use music or television while you are exercising so that the exercises are a pleasant break in your day. This will make your life better  with the exercises acting as a break in your routine that you can look forward to.   Perform all exercises about fifteen times, three times per day or as directed.  You should exercise both the operative leg and the other leg as well.  Exercises include:   Quad Sets - Tighten up the muscle on the front of the thigh (Quad) and hold for 5-10 seconds.   Straight Leg Raises - With your knee straight (if you were given a brace, keep it on), lift the leg to 60 degrees, hold for 3 seconds, and slowly lower the leg.  Perform this exercise against resistance later as your leg gets stronger.  Leg Slides: Lying on your back, slowly slide your foot toward your buttocks, bending your knee up off the floor (only go as far as is comfortable). Then slowly slide your foot back down until your leg is flat on the floor again.  Angel Wings: Lying on your back spread your legs to the side as far apart as you can without causing discomfort.  Hamstring Strength:  Lying on your back, push your  heel against the floor with your leg straight by tightening up the muscles of your buttocks.  Repeat, but this time bend your knee to a comfortable angle, and push your heel against the floor.  You may put a pillow under the heel to make it more comfortable if necessary.   A rehabilitation program following joint replacement surgery can speed recovery and prevent re-injury in the future due to weakened muscles. Contact your doctor or a physical therapist for more information on knee rehabilitation.    CONSTIPATION  Constipation is defined medically as fewer than three stools per week and severe constipation as less than one stool per week.  Even if you have a regular bowel pattern at home, your normal regimen is likely to be disrupted due to multiple reasons following surgery.  Combination of anesthesia, postoperative narcotics, change in appetite and fluid intake all can affect your bowels.   YOU MUST use at least one of the  following options; they are listed in order of increasing strength to get the job done.  They are all available over the counter, and you may need to use some, POSSIBLY even all of these options:    Drink plenty of fluids (prune juice may be helpful) and high fiber foods Colace 100 mg by mouth twice a day  Senokot for constipation as directed and as needed Dulcolax (bisacodyl), take with full glass of water  Miralax (polyethylene glycol) once or twice a day as needed.  If you have tried all these things and are unable to have a bowel movement in the first 3-4 days after surgery call either your surgeon or your primary doctor.    If you experience loose stools or diarrhea, hold the medications until you stool forms back up.  If your symptoms do not get better within 1 week or if they get worse, check with your doctor.  If you experience "the worst abdominal pain ever" or develop nausea or vomiting, please contact the office immediately for further recommendations for treatment.   ITCHING:  If you experience itching with your medications, try taking only a single pain pill, or even half a pain pill at a time.  You can also use Benadryl over the counter for itching or also to help with sleep.   TED HOSE STOCKINGS:  Use stockings on both legs until for at least 2 weeks or as directed by physician office. They may be removed at night for sleeping.  MEDICATIONS:  See your medication summary on the "After Visit Summary" that nursing will review with you.  You may have some home medications which will be placed on hold until you complete the course of blood thinner medication.  It is important for you to complete the blood thinner medication as prescribed.  Take medicines as prescribed.   You have several different medicines that work in different ways. - Tylenol is for mild to moderate pain. Try to take this medicine before turning to your narcotic medicines.  - Meloxicam is to reduce pain /  inflammation - Robaxin is for muscle spasms. This medicine can make you drowsy. - Oxycodone is a narcotic pain medicine.  Take this for severe pain. This medicine can be dehydrating / constipating. - Zofran is for nausea and vomiting. - Colace for constipation - Aspirin is to prevent blood clots after surgery. YOU MUST TAKE THIS MEDICINE!  PRECAUTIONS:  If you experience chest pain or shortness of breath - call 911 immediately for transfer to the hospital  emergency department.   If you develop a fever greater that 101 F, purulent drainage from wound, increased redness or drainage from wound, foul odor from the wound/dressing, or calf pain - CONTACT YOUR SURGEON.                                                   FOLLOW-UP APPOINTMENTS:  If you do not already have a post-op appointment, please call the office [phone] for an appointment to be seen by Dr. Percell Miller in 2 weeks.   OTHER INSTRUCTIONS:   MAKE SURE YOU:  Understand these instructions.  Get help right away if you are not doing well or get worse.    Thank you for letting us be a part of your medical care team.  It is a privilege we respect greatly.  We hope these instructions will help you stay on track for a fast and full recovery!   Driving restrictions   Complete by: As directed    No driving for 2 weeks   Post-operative opioid taper instructions:   Complete by: As directed    POST-OPERATIVE OPIOID TAPER INSTRUCTIONS: It is important to wean off of your opioid medication as soon as possible. If you do not need pain medication after your surgery it is ok to stop day one. Opioids include: Codeine, Hydrocodone(Norco, Vicodin), Oxycodone(Percocet, oxycontin) and hydromorphone amongst others.  Long term and even short term use of opiods can cause: Increased pain response Dependence Constipation Depression Respiratory depression And more.  Withdrawal symptoms can include Flu like symptoms Nausea, vomiting And  more Techniques to manage these symptoms Hydrate well Eat regular healthy meals Stay active Use relaxation techniques(deep breathing, meditating, yoga) Do Not substitute Alcohol to help with tapering If you have been on opioids for less than two weeks and do not have pain than it is ok to stop all together.  Plan to wean off of opioids This plan should start within one week post op of your joint replacement. Maintain the same interval or time between taking each dose and first decrease the dose.  Cut the total daily intake of opioids by one tablet each day Next start to increase the time between doses. The last dose that should be eliminated is the evening dose.      TED hose   Complete by: As directed    Use stockings (TED hose) for 2 weeks on right leg(s).  You may remove them at night for sleeping.   Weight bearing as tolerated   Complete by: As directed       Allergies as of 07/18/2021       Reactions   Sulfa Antibiotics Anaphylaxis   Hydrocodone Nausea And Vomiting        Medication List     TAKE these medications    acetaminophen 500 MG tablet Commonly known as: TYLENOL Take 2 tablets (1,000 mg total) by mouth every 6 (six) hours as needed for mild pain or moderate pain. What changed:  when to take this reasons to take this   amLODipine 5 MG tablet Commonly known as: NORVASC TAKE 1 TABLET BY MOUTH AT BEDTIME   aspirin EC 81 MG tablet Take 1 tablet (81 mg total) by mouth 2 (two) times daily. What changed: when to take this Notes to patient: Blood clot prevention   B-D SINGLE USE  SWABS REGULAR Pads   Breztri Aerosphere 160-9-4.8 MCG/ACT Aero Generic drug: Budeson-Glycopyrrol-Formoterol INHALE 2 PUFFS INTO THE LUNGS TWICE DAILY WITH SPACER. RINSE, GARGLE AND SPIT AFTER USE.   budesonide 0.5 MG/2ML nebulizer solution Commonly known as: Pulmicort Add twice daily for 1-2 weeks during respiratory flares. What changed:  how much to take how to take  this when to take this reasons to take this additional instructions   carvedilol 25 MG tablet Commonly known as: COREG Take 25 mg by mouth 2 (two) times daily with a meal.   diphenhydrAMINE 25 MG tablet Commonly known as: BENADRYL Take 25 mg by mouth at bedtime.   docusate sodium 100 MG capsule Commonly known as: Colace Take 1 capsule (100 mg total) by mouth 2 (two) times daily as needed for mild constipation or moderate constipation. Notes to patient: Stool softener; take while taking pain medication, tylenol, or tramadol   Droplet Pen Needles 32G X 4 MM Misc Generic drug: Insulin Pen Needle   famotidine 20 MG tablet Commonly known as: PEPCID Take 20 mg by mouth at bedtime.   Fasenra 30 MG/ML Sosy Generic drug: Benralizumab Inject 1 mL (30 mg total) into the skin every 8 (eight) weeks.   gabapentin 300 MG capsule Commonly known as: NEURONTIN Take 300 mg by mouth 2 (two) times daily.   guaiFENesin 600 MG 12 hr tablet Commonly known as: MUCINEX Take 1 tablet (600 mg total) by mouth 2 (two) times daily.   hydrochlorothiazide 25 MG tablet Commonly known as: HYDRODIURIL Take 25 mg by mouth daily.   ipratropium 0.03 % nasal spray Commonly known as: ATROVENT USE 2 SPRAYS IN EACH NOSTRIL THREE TIMES DAILY What changed:  how much to take how to take this when to take this reasons to take this additional instructions   ipratropium-albuterol 0.5-2.5 (3) MG/3ML Soln Commonly known as: DUONEB INHALE THE CONTENTS OF 1 VIAL VIA NEBULIZER EVERY FOUR HOURS AS NEEDED.   Lantus SoloStar 100 UNIT/ML Solostar Pen Generic drug: insulin glargine Inject 40 Units into the skin in the morning.   LORazepam 0.5 MG tablet Commonly known as: ATIVAN Take 0.5 mg by mouth at bedtime.   meloxicam 15 MG tablet Commonly known as: MOBIC Take 15 mg by mouth in the morning.   metFORMIN 500 MG 24 hr tablet Commonly known as: GLUCOPHAGE-XR Take 2,000 mg by mouth daily with breakfast.    methocarbamol 750 MG tablet Commonly known as: Robaxin-750 Take 1 tablet (750 mg total) by mouth every 8 (eight) hours as needed for muscle spasms.   NovoLOG FlexPen 100 UNIT/ML FlexPen Generic drug: insulin aspart Inject 2 Units into the skin 3 (three) times daily with meals. Per sliding scale   nystatin 100000 UNIT/ML suspension Commonly known as: MYCOSTATIN Take 5 mLs (500,000 Units total) by mouth in the morning and at bedtime. Swish and swallow What changed:  when to take this reasons to take this   ondansetron 4 MG disintegrating tablet Commonly known as: ZOFRAN-ODT Take 1 tablet (4 mg total) by mouth 2 (two) times daily as needed for nausea or vomiting.   oxyCODONE 5 MG immediate release tablet Commonly known as: Roxicodone Take 1 tablet (5 mg total) by mouth every 6 (six) hours as needed for severe pain. Do not take more than 6 tablets in a 24 hour period.   pantoprazole 40 MG tablet Commonly known as: PROTONIX Take 1 tablet (40 mg total) by mouth 2 (two) times daily before a meal.   pravastatin 10 MG  tablet Commonly known as: PRAVACHOL Take 10 mg by mouth at bedtime.   PRESCRIPTION MEDICATION Inject into the vein every 28 (twenty-eight) days. Immunoglobulin Hormone replacement therapy   True Metrix Air Glucose Meter w/Device Kit   True Metrix Blood Glucose Test test strip Generic drug: glucose blood   True Metrix Level 1 Low Soln   TRUEplus Lancets 33G Misc   valsartan 320 MG tablet Commonly known as: DIOVAN Take 320 mg by mouth at bedtime.   Ventolin HFA 108 (90 Base) MCG/ACT inhaler Generic drug: albuterol INHALE 2 PUFFS EVERY 4 HOURS AS NEEDED FOR WHEEZING OR SHORTNESS OF BREATH.   Victoza 18 MG/3ML Sopn Generic drug: liraglutide Inject 1.8 mg into the skin daily at 8 pm.               Discharge Care Instructions  (From admission, onward)           Start     Ordered   07/18/21 0000  Weight bearing as tolerated        07/18/21 1349             Follow-up Information     [name], MD. Go on 08/01/2021.   Specialty: Orthopedic Surgery Why: Your appointment is scheduled for 4:15. Contact information: [address] [place] [address] [phone]         [facility] Go on 07/19/2021.   Specialty: Physical Therapy Why: Your therapy appointment is scheduled for 2:00. Contact information: [facility] [place] [address] [phone]                 Signed: Alisa Graff 07/18/2021, 4:26 PM

## 2021-07-19 DIAGNOSIS — M25551 Pain in right hip: Secondary | ICD-10-CM | POA: Diagnosis not present

## 2021-07-23 DIAGNOSIS — M25551 Pain in right hip: Secondary | ICD-10-CM | POA: Diagnosis not present

## 2021-07-26 DIAGNOSIS — M25551 Pain in right hip: Secondary | ICD-10-CM | POA: Diagnosis not present

## 2021-07-30 DIAGNOSIS — M25551 Pain in right hip: Secondary | ICD-10-CM | POA: Diagnosis not present

## 2021-08-01 DIAGNOSIS — M1611 Unilateral primary osteoarthritis, right hip: Secondary | ICD-10-CM | POA: Diagnosis not present

## 2021-08-02 DIAGNOSIS — M25551 Pain in right hip: Secondary | ICD-10-CM | POA: Diagnosis not present

## 2021-08-04 DIAGNOSIS — D839 Common variable immunodeficiency, unspecified: Secondary | ICD-10-CM | POA: Diagnosis not present

## 2021-08-06 DIAGNOSIS — M25551 Pain in right hip: Secondary | ICD-10-CM | POA: Diagnosis not present

## 2021-08-09 DIAGNOSIS — M25551 Pain in right hip: Secondary | ICD-10-CM | POA: Diagnosis not present

## 2021-08-13 DIAGNOSIS — M25551 Pain in right hip: Secondary | ICD-10-CM | POA: Diagnosis not present

## 2021-08-16 DIAGNOSIS — M25551 Pain in right hip: Secondary | ICD-10-CM | POA: Diagnosis not present

## 2021-08-22 ENCOUNTER — Ambulatory Visit (INDEPENDENT_AMBULATORY_CARE_PROVIDER_SITE_OTHER): Payer: Medicare HMO | Admitting: Pulmonary Disease

## 2021-08-22 ENCOUNTER — Encounter: Payer: Self-pay | Admitting: Pulmonary Disease

## 2021-08-22 ENCOUNTER — Other Ambulatory Visit: Payer: Self-pay

## 2021-08-22 DIAGNOSIS — J4550 Severe persistent asthma, uncomplicated: Secondary | ICD-10-CM

## 2021-08-22 DIAGNOSIS — J310 Chronic rhinitis: Secondary | ICD-10-CM

## 2021-08-22 MED ORDER — NYSTATIN 100000 UNIT/ML MT SUSP: 5.0000 mL | Freq: Two times a day (BID) | OROMUCOSAL | 3 refills | Status: DC

## 2021-08-22 NOTE — Patient Instructions (Signed)
?  X refill on nystatin x 3 ? ?Take flonase & singulair in spring / fall  ? ?

## 2021-08-22 NOTE — Assessment & Plan Note (Signed)
She feels that seasonal allergies are worsening her breathing. ?I am asked her to use Flonase and montelukast during spring and fall. ?She will continue on Benadryl at bedtime ?

## 2021-08-22 NOTE — Progress Notes (Signed)
? ?  Subjective:  ? ? Patient ID: Kathy Tran, female    DOB: [birth year], 66 y.o.   MRN: [id number] ? ?HPI ? ?66 yo  never smoker  for FU of adult onset asthma,  ?She sees Dr. 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 ?  ?There is no childhood history of asthma. ?Symptom onset was in 2018 after an attack of the flu.  She reports an attack of bronchitis in 2016 ?Repeated ear infections as a child requiring multiple ear tubes through 1990s and 2000, sinus surgery 1998 ?Hospitalization 07/2016 for bronchospasm , remained on prednisone for 3 months ?IgG  398  09/2016 , given IVIG ?Bronchoscopy 10/2016, 01/2017 for mucous plugs ?Allergy consultation 2018 >> maintained on IV IgG every 4 weeks - startde 03/2017 , Nucala every 4 weeks and Trelegy, prednisone tapered 05/2017 ?  ?[facility] pulmonary consultation 07/2017 was reviewed >> felt that chronic prednisone use caused hypogammaglobulinemia ?  ?However when IVIG was stopped for 3 months her symptoms returned and she has been maintained on IVIG infusions since. ?Repeat IgG testing showed normal levels of 794 09/2018 and 838 and 07/2018 ? ?Chief Complaint  ?Patient presents with  ? Follow-up  ?  Having issues with pollen.   ? ?On her last OV 01/2021, we dc'd budesonide due to frequent thrush ?She has done well over the last 6 months and has not had a flareup requiring steroids. ?Due to pollen season, her breathing is slightly worse and she needed rescue MDI couple of times over the past week. ?She underwent right hip replacement in March and pain has improved significantly. ?She had a sinus infection in February and needed an antibiotic. ?She continues on 8 weekly Fasenra injections at the allergist and for weekly immunoglobulin infusions at home. ?She is compliant with Breztri. ? ?Spirometry 04/2019 was reviewed which shows no airway obstruction with FEV1 79% and FVC 78% ? ? ?Significant tests/ events  reviewed ?CT angiogram chest 09/2016 nodular opacities in the right lung largest 8 x 6 mm subpleural right upper lobe ?  ?PFTs 01/2017 moderate airway obstruction with ratio 68, FEV1 1.52/63%, FVC 71%, no bronchodilator response, TLC 90% and DLCO 79% ?  ?Serial spirometry 2019 and 2020 shows no airway obstruction with FEV1 ranging from 65% to 113% ?  ?01/2017 RAST neg, IgE <2 ?Wells 09/2018 100 ?  ? ?Review of Systems ?neg for any significant sore throat, dysphagia, itching, sneezing, nasal congestion or excess/ purulent secretions, fever, chills, sweats, unintended wt loss, pleuritic or exertional cp, hempoptysis, orthopnea pnd or change in chronic leg swelling. Also denies presyncope, palpitations, heartburn, abdominal pain, nausea, vomiting, diarrhea or change in bowel or urinary habits, dysuria,hematuria, rash, arthralgias, visual complaints, headache, numbness weakness or ataxia. ? ?   ?Objective:  ? Physical Exam ? ?Gen. Pleasant, well-nourished, in no distress ?ENT - no thrush, no pallor/icterus,no post nasal drip ?Neck: No JVD, no thyromegaly, no carotid bruits ?Lungs: no use of accessory muscles, no dullness to percussion, clear without rales or rhonchi  ?Cardiovascular: Rhythm regular, heart sounds  normal, no murmurs or gallops, no peripheral edema ?Musculoskeletal: No deformities, no cyanosis or clubbing  ? ? ? ?   ?Assessment & Plan:  ? ? ?

## 2021-08-22 NOTE — Assessment & Plan Note (Addendum)
She will continue on 8 weekly Fasenra injections. ?She has done well in spite of discontinuing budesonide.  She is compliant with Breztri. ?Continue to use albuterol MDI/nebs for rescue. ? ?Refills will be provided for nystatin, we discussed measures to prevent thrush ? ?

## 2021-08-29 ENCOUNTER — Ambulatory Visit (INDEPENDENT_AMBULATORY_CARE_PROVIDER_SITE_OTHER): Payer: Medicare HMO

## 2021-08-29 DIAGNOSIS — J4550 Severe persistent asthma, uncomplicated: Secondary | ICD-10-CM

## 2021-08-29 DIAGNOSIS — M1611 Unilateral primary osteoarthritis, right hip: Secondary | ICD-10-CM | POA: Diagnosis not present

## 2021-09-04 DIAGNOSIS — D839 Common variable immunodeficiency, unspecified: Secondary | ICD-10-CM | POA: Diagnosis not present

## 2021-09-09 DIAGNOSIS — I10 Essential (primary) hypertension: Secondary | ICD-10-CM | POA: Diagnosis not present

## 2021-09-09 DIAGNOSIS — E119 Type 2 diabetes mellitus without complications: Secondary | ICD-10-CM | POA: Diagnosis not present

## 2021-09-13 DIAGNOSIS — H5203 Hypermetropia, bilateral: Secondary | ICD-10-CM | POA: Diagnosis not present

## 2021-09-13 DIAGNOSIS — E119 Type 2 diabetes mellitus without complications: Secondary | ICD-10-CM | POA: Diagnosis not present

## 2021-09-13 DIAGNOSIS — E7800 Pure hypercholesterolemia, unspecified: Secondary | ICD-10-CM | POA: Diagnosis not present

## 2021-09-13 DIAGNOSIS — Z0100 Encounter for examination of eyes and vision without abnormal findings: Secondary | ICD-10-CM | POA: Diagnosis not present

## 2021-09-13 DIAGNOSIS — H5200 Hypermetropia, unspecified eye: Secondary | ICD-10-CM | POA: Diagnosis not present

## 2021-09-29 DIAGNOSIS — D839 Common variable immunodeficiency, unspecified: Secondary | ICD-10-CM | POA: Diagnosis not present

## 2021-10-10 DIAGNOSIS — X32XXXD Exposure to sunlight, subsequent encounter: Secondary | ICD-10-CM | POA: Diagnosis not present

## 2021-10-10 DIAGNOSIS — L570 Actinic keratosis: Secondary | ICD-10-CM | POA: Diagnosis not present

## 2021-10-10 DIAGNOSIS — B078 Other viral warts: Secondary | ICD-10-CM | POA: Diagnosis not present

## 2021-10-12 ENCOUNTER — Ambulatory Visit (INDEPENDENT_AMBULATORY_CARE_PROVIDER_SITE_OTHER): Payer: Medicare HMO | Admitting: Allergy & Immunology

## 2021-10-12 VITALS — BP 122/70 | HR 80 | Temp 98.1°F | Resp 18 | Ht 62.21 in | Wt 152.6 lb

## 2021-10-12 DIAGNOSIS — J4550 Severe persistent asthma, uncomplicated: Secondary | ICD-10-CM | POA: Diagnosis not present

## 2021-10-12 DIAGNOSIS — J310 Chronic rhinitis: Secondary | ICD-10-CM

## 2021-10-12 DIAGNOSIS — D801 Nonfamilial hypogammaglobulinemia: Secondary | ICD-10-CM | POA: Diagnosis not present

## 2021-10-12 NOTE — Patient Instructions (Addendum)
1. 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  - Daily controller medication(s): Breztri two puffs twice daily + Fasenra every 8 weeks - Rescue medications: ProAir 4 puffs every 4-6 hours as needed or DuoNeb nebulizer one vial every 4-6 hours as needed  - Changes during respiratory flares: Add on Pulmicort (budesonide) 0.5 mg twice daily via nebulizer for 1-2 weeks - Asthma control goals:  * Full participation in all desired activities (may need albuterol before activity) * Albuterol use two time or less a week on average (not counting use with activity) * Cough interfering with sleep two time or less a month * Oral steroids no more than once a year * No hospitalizations     2. Chronic non-allergic rhinitis - Continue with Benadryl '25mg'$  at night.   3. 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  - Continue with all of the premedications, including hydration.  - Get your labs the day before your next infusion.  4. Return in about 6 months (around 04/13/2022).    Please inform us of any Emergency Department visits, hospitalizations, or changes in symptoms. Call us before going to the ED for breathing or allergy symptoms since we might be able to fit you in for a sick visit. Feel free to contact us anytime with any questions, problems, or concerns.  It was a pleasure to see you again today!  Websites that have reliable patient information: 1. American Academy of Asthma, Allergy, and Immunology: www.aaaai.org 2. Food Allergy Research and Education (FARE): foodallergy.org 3. Mothers of Asthmatics: http://www.asthmacommunitynetwork.org 4. American College of Allergy, Asthma, and Immunology: www.acaai.org   COVID-19 Vaccine Information can be found at: ShippingScam.co.uk For questions related to vaccine distribution or appointments, please email  [email or web address] or call [phone].   We realize that you might be concerned about having an allergic reaction to the COVID19 vaccines. To help with that concern, WE ARE OFFERING THE COVID19 VACCINES IN OUR OFFICE! Ask the front desk for dates!     "Like" [place] on Facebook and Instagram for our latest updates!      A healthy democracy works best when [employer] participate! Make sure you are registered to vote! If you have moved or changed any of your contact information, you will need to get this updated before voting!  In some cases, you MAY be able to register to vote online: [email or web address]

## 2021-10-12 NOTE — Progress Notes (Signed)
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  We are not going to make any changes and see her again in 6 months or as needed.  She also comes in for her Fasenra injections and usually updates at that time as well.  We did send in refills, although she was not sure which ones she needed.  I did assure her that the pharmacy can contact us to get refills as well.  It was so good to see her doing so well  Plan/Recommendations:   1. 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  - Daily controller medication(s): Breztri two puffs twice daily + Fasenra every 8 weeks - Rescue medications: ProAir 4 puffs every 4-6 hours as needed or DuoNeb nebulizer one vial every 4-6 hours as needed  - Changes during respiratory flares: Add on Pulmicort (budesonide) 0.5 mg twice daily via nebulizer for 1-2 weeks - Asthma control goals:  * Full participation in all desired activities (may need albuterol before activity) * Albuterol use two time or less a week on average (not counting use with activity) * Cough interfering with sleep two time or less a month * Oral steroids no more than once a year * No hospitalizations     2. Chronic non-allergic rhinitis - Continue with Benadryl '25mg'$  at night.   3. 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  - Continue with all of the premedications, including hydration.  - Get your labs the day before your next infusion.  4. Return in about 6 months (around 04/13/2022).   Subjective:   [name] is a 66 y.o. female  presenting today for follow up of  Chief Complaint  Patient presents with   Asthma    Says she has had a few asthma attacks due to the weather. Restarted singular and it helps.    Allergic Rhinitis     Says she is well.    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 ([place]) 02/27/2018   Hx of colonic polyps 01/07/2018   Postmenopausal 07/02/2017   Hypogammaglobulinemia ([place]) 04/29/2017   Recurrent infections 04/29/2017   Non-allergic rhinitis 04/29/2017   Severe persistent asthma without complication 24/01/7352   Essential hypertension, benign 03/20/2017   Asthma exacerbation 12/26/2016   Asthmatic bronchitis 07/18/2016   DM type 2 causing vascular disease ([place]) 07/18/2016   GERD (gastroesophageal reflux disease) 07/18/2016   Mixed hyperlipidemia 07/18/2016   History of colonic polyps 06/12/2016    History obtained from: chart review and patient.  [name] is a 66 y.o. female presenting for a follow up visit.  I last saw her in December 2022.  At that time, her lung function looked amazing.  We continued her on Breztri 2 puffs twice daily as well as her Berna Bue every 8 weeks.  She was previously on Trelegy for a long period of  time, but she was getting hoarseness from that.  She has Pulmicort that she adds during flares.  Her nonallergic rhinitis, would continue with Benadryl at night as needed.  We continued her on IVIG for specific antibody deficiency as well.  Since last visit, she has done very well.  She underwent a hip replacement since I saw her and she has recovered remarkably well from that. Her hip replacement did great. She stayed overnight and she did some therapy the following morning. She feels like a new person now. She is going to have the left one done early in 2024.   Asthma/Respiratory  Symptom History: She had two asthma attacks last month or so. This was pollen triggered. She has been doing her nebulizers. She has been doing some exercising of her lungs with the incentive spirometry. She is using the flutter valve. She feels that things are "trying to settle" in her lungs. The infusions always help her symptoms.  She has never had bronchectasis. She is on the Breztri two puffs BID. Dorna Mai is much less severe with the [employer].   She has mostly clear sputum and this is mostly in the mornings when she wakes up for one hour or so. She will use her nasal spray and it will drain. By 9am, she is mucous free and feels very good.  She does have a flutter valve as she tries occasionally.  He did see Dr. Elsworth Soho again in April 2023.  He felt that her breathing was under good control.  Her nystatin was refilled.  Allergic Rhinitis Symptom History: She actually sprays her ipratropium on a Qtip and this is enough to get it into her nose.  She is taking Singulair. She was remaining congested all of the time which is why she restarted the Singulair. She did have some headache which resolved the headache.   Infusions are going well.  She is not having any adverse reactions to it.  She has a same premedication regimen.  She has not had any breakthrough infections since we saw her last time.  She is on Victoza for her diabetes.  This is all managed by Dr. Willey Blade.  Otherwise, there have been no changes to her past medical history, surgical history, family history, or social history.    Review of Systems  Constitutional: Negative.  Negative for chills, fever, malaise/fatigue and weight loss.  HENT: Negative.  Negative for congestion, ear discharge and ear pain.   Eyes:  Negative for pain, discharge and redness.  Respiratory:  Negative for cough, sputum production, shortness of breath and wheezing.   Cardiovascular: Negative.  Negative for chest pain and palpitations.  Gastrointestinal:  Negative for  abdominal pain, constipation, diarrhea, heartburn, nausea and vomiting.  Skin: Negative.  Negative for itching and rash.  Neurological:  Negative for dizziness and headaches.  Endo/Heme/Allergies:  Negative for environmental allergies. Does not bruise/bleed easily.      Objective:   Blood pressure 122/70, pulse 80, temperature 98.1 F (36.7 C), temperature source Temporal, resp. rate 18, height 5' 2.21" (1.58 m), weight 152 lb 9.6 oz (69.2 kg), SpO2 98 %. Body mass index is 27.73 kg/m.    Physical Exam Vitals reviewed.  Constitutional:      Appearance: She is well-developed.     Comments: Very pleasant and talkative.   HENT:     Head: Normocephalic and atraumatic.     Right Ear: Tympanic membrane, ear canal and external ear normal.     Left Ear: Tympanic membrane,  ear canal and external ear normal.     Nose: No nasal deformity, septal deviation, mucosal edema or rhinorrhea.     Right Turbinates: Enlarged, swollen and pale.     Left Turbinates: Enlarged, swollen and pale.     Right Sinus: No maxillary sinus tenderness or frontal sinus tenderness.     Left Sinus: No maxillary sinus tenderness or frontal sinus tenderness.     Mouth/Throat:     Mouth: Mucous membranes are not pale and not dry.     Pharynx: Uvula midline.  Eyes:     General: Lids are normal. No allergic shiner.       Right eye: No discharge.        Left eye: No discharge.     Conjunctiva/sclera: Conjunctivae normal.     Right eye: Right conjunctiva is not injected. No chemosis.    Left eye: Left conjunctiva is not injected. No chemosis.    Pupils: Pupils are equal, round, and reactive to light.  Cardiovascular:     Rate and Rhythm: Normal rate and regular rhythm.     Heart sounds: Normal heart sounds.  Pulmonary:     Effort: Pulmonary effort is normal. No tachypnea, accessory muscle usage or respiratory distress.     Breath sounds: Normal breath sounds. No wheezing, rhonchi or rales.     Comments: Air well  in all lung fields. Chest:     Chest wall: No tenderness.  Lymphadenopathy:     Cervical: No cervical adenopathy.  Skin:    General: Skin is warm.     Capillary Refill: Capillary refill takes less than 2 seconds.     Coloration: Skin is not pale.     Findings: No abrasion, erythema, petechiae or rash. Rash is not papular, urticarial or vesicular.     Comments: No eczematous or urticarial lesions noted.  Neurological:     Mental Status: She is alert.  Psychiatric:        Behavior: Behavior is cooperative.     Diagnostic studies:    Spirometry: results normal (FEV1: 1.74/78%, FVC: 2.16/76%, FEV1/FVC: 81%).    Spirometry consistent with normal pattern.    Allergy Studies: none        Salvatore Marvel, MD  [facility]

## 2021-10-15 ENCOUNTER — Encounter: Payer: Self-pay | Admitting: Allergy & Immunology

## 2021-10-26 DIAGNOSIS — D801 Nonfamilial hypogammaglobulinemia: Secondary | ICD-10-CM | POA: Diagnosis not present

## 2021-10-27 LAB — CBC WITH DIFFERENTIAL
Basophils Absolute: 0 10*3/uL (ref 0.0–0.2)
Basos: 0 %
EOS (ABSOLUTE): 0 10*3/uL (ref 0.0–0.4)
Eos: 0 %
Hematocrit: 38.6 % (ref 34.0–46.6)
Hemoglobin: 12.9 g/dL (ref 11.1–15.9)
Immature Grans (Abs): 0 10*3/uL (ref 0.0–0.1)
Immature Granulocytes: 0 %
Lymphocytes Absolute: 1.1 10*3/uL (ref 0.7–3.1)
Lymphs: 27 %
MCH: 31.8 pg (ref 26.6–33.0)
MCHC: 33.4 g/dL (ref 31.5–35.7)
MCV: 95 fL (ref 79–97)
Monocytes Absolute: 0.2 10*3/uL (ref 0.1–0.9)
Monocytes: 6 %
Neutrophils Absolute: 2.6 10*3/uL (ref 1.4–7.0)
Neutrophils: 67 %
RBC: 4.06 x10E6/uL (ref 3.77–5.28)
RDW: 11.9 % (ref 11.7–15.4)
WBC: 3.9 10*3/uL (ref 3.4–10.8)

## 2021-10-27 LAB — CMP14+EGFR
ALT: 13 IU/L (ref 0–32)
AST: 13 IU/L (ref 0–40)
Albumin/Globulin Ratio: 2.4 — ABNORMAL HIGH (ref 1.2–2.2)
Albumin: 4.4 g/dL (ref 3.8–4.8)
Alkaline Phosphatase: 66 IU/L (ref 44–121)
BUN/Creatinine Ratio: 25 (ref 12–28)
BUN: 19 mg/dL (ref 8–27)
Bilirubin Total: 0.5 mg/dL (ref 0.0–1.2)
CO2: 22 mmol/L (ref 20–29)
Calcium: 9.2 mg/dL (ref 8.7–10.3)
Chloride: 103 mmol/L (ref 96–106)
Creatinine, Ser: 0.75 mg/dL (ref 0.57–1.00)
Globulin, Total: 1.8 g/dL (ref 1.5–4.5)
Glucose: 166 mg/dL — ABNORMAL HIGH (ref 70–99)
Potassium: 4 mmol/L (ref 3.5–5.2)
Sodium: 141 mmol/L (ref 134–144)
Total Protein: 6.2 g/dL (ref 6.0–8.5)
eGFR: 89 mL/min/{1.73_m2} (ref >59)

## 2021-10-27 LAB — IGG: IgG (Immunoglobin G), Serum: 873 mg/dL (ref 586–1602)

## 2021-10-29 ENCOUNTER — Ambulatory Visit (INDEPENDENT_AMBULATORY_CARE_PROVIDER_SITE_OTHER): Payer: Medicare HMO

## 2021-10-29 DIAGNOSIS — J4550 Severe persistent asthma, uncomplicated: Secondary | ICD-10-CM

## 2021-10-29 DIAGNOSIS — D839 Common variable immunodeficiency, unspecified: Secondary | ICD-10-CM | POA: Diagnosis not present

## 2021-11-05 DIAGNOSIS — K121 Other forms of stomatitis: Secondary | ICD-10-CM | POA: Diagnosis not present

## 2021-11-26 DIAGNOSIS — D839 Common variable immunodeficiency, unspecified: Secondary | ICD-10-CM | POA: Diagnosis not present

## 2021-12-21 DIAGNOSIS — M62830 Muscle spasm of back: Secondary | ICD-10-CM | POA: Diagnosis not present

## 2021-12-21 DIAGNOSIS — M546 Pain in thoracic spine: Secondary | ICD-10-CM | POA: Diagnosis not present

## 2021-12-21 DIAGNOSIS — M5441 Lumbago with sciatica, right side: Secondary | ICD-10-CM | POA: Diagnosis not present

## 2021-12-21 DIAGNOSIS — M9901 Segmental and somatic dysfunction of cervical region: Secondary | ICD-10-CM | POA: Diagnosis not present

## 2021-12-21 DIAGNOSIS — M9905 Segmental and somatic dysfunction of pelvic region: Secondary | ICD-10-CM | POA: Diagnosis not present

## 2021-12-21 DIAGNOSIS — M9902 Segmental and somatic dysfunction of thoracic region: Secondary | ICD-10-CM | POA: Diagnosis not present

## 2021-12-21 DIAGNOSIS — M542 Cervicalgia: Secondary | ICD-10-CM | POA: Diagnosis not present

## 2021-12-24 ENCOUNTER — Ambulatory Visit: Payer: Medicare HMO

## 2021-12-24 DIAGNOSIS — D839 Common variable immunodeficiency, unspecified: Secondary | ICD-10-CM | POA: Diagnosis not present

## 2021-12-28 ENCOUNTER — Ambulatory Visit (INDEPENDENT_AMBULATORY_CARE_PROVIDER_SITE_OTHER): Payer: Medicare HMO | Admitting: *Deleted

## 2021-12-28 DIAGNOSIS — J4550 Severe persistent asthma, uncomplicated: Secondary | ICD-10-CM | POA: Diagnosis not present

## 2021-12-31 ENCOUNTER — Encounter: Payer: Self-pay | Admitting: Family Medicine

## 2021-12-31 ENCOUNTER — Telehealth: Payer: Self-pay

## 2021-12-31 ENCOUNTER — Ambulatory Visit (INDEPENDENT_AMBULATORY_CARE_PROVIDER_SITE_OTHER): Payer: Medicare HMO | Admitting: Family Medicine

## 2021-12-31 ENCOUNTER — Telehealth: Payer: Self-pay | Admitting: *Deleted

## 2021-12-31 VITALS — BP 122/66 | HR 72 | Temp 98.2°F | Resp 16

## 2021-12-31 DIAGNOSIS — L282 Other prurigo: Secondary | ICD-10-CM

## 2021-12-31 DIAGNOSIS — J310 Chronic rhinitis: Secondary | ICD-10-CM

## 2021-12-31 DIAGNOSIS — J4550 Severe persistent asthma, uncomplicated: Secondary | ICD-10-CM

## 2021-12-31 DIAGNOSIS — D801 Nonfamilial hypogammaglobulinemia: Secondary | ICD-10-CM

## 2021-12-31 MED ORDER — METHYLPREDNISOLONE ACETATE 40 MG/ML IJ SUSP
40.0000 mg | Freq: Once | INTRAMUSCULAR | Status: AC
  Administered 2021-12-31: 40 mg via INTRAMUSCULAR

## 2021-12-31 NOTE — Progress Notes (Signed)
[address] [place] [address] Dept: [phone]  FOLLOW UP NOTE  Patient ID: Kathy Tran, female    DOB: 13-Oct-1955  Age: 66 y.o. MRN: [id number] Date of Office Visit: 12/31/2021  Assessment  Chief Complaint: Allergic Reaction (Rash all over chest area, arms, upper back. Last reaction was in May 2019. Has been taking benadryl and using benadryl gel. Hot/itchy. She first noticed it on Friday. )  HPI Kathy Tran is a 66 year old female who presents to the clinic for evaluation of acute rash. She was last seen in this clinic on 10/12/2021 by Dr. Ernst Bowler for evaluation of asthma, allergic rhinitis, and specific antibody deficiency. Her history is significant for diabetes requiring insulin and frequent episodes of thrush. At today's visit, she reports that she completed her Previgin infusion one week ago on Monday and received a Fasenra injection on Friday. She reports that she began to notice some hives occurring on her shoulders for which she took Benadryl with mild relief. She reports that she spent about 4 hours in the sun on Saturday and her hives grew in number. She reports that they are hot, itchy and increasing daily. She denies cardiopulmonary symptoms, however, does report some painless diarrhea occurring on Saturday night. She has experienced hives and abdominal pain similar to this in 2019 at which time she stopped receiving IVIG and was hospitalized after about 3 months. Asthma is reported as well controlled with Breztri 2 puffs twice a day and Fasenra injections once every 8 weeks. She rarely uses albuterol for relief of symptoms. Allergic rhinitis is reported as moderately well controlled with occasional use of  ipratropium with relief of symptoms. Her current medications are listed in the chart.  Drug Allergies:  Allergies  Allergen Reactions   Sulfa Antibiotics Anaphylaxis   Hydrocodone Nausea And Vomiting    Physical Exam: BP 122/66   Pulse 72   Temp  98.2 F (36.8 C)   Resp 16   SpO2 98%    Physical Exam Vitals reviewed.  Constitutional:      Appearance: Normal appearance.  HENT:     Head: Normocephalic and atraumatic.     Right Ear: Tympanic membrane normal.     Left Ear: Tympanic membrane normal.     Nose:     Comments: Bilateral nares normal. Pharynx normal. Ears normal. Eyes normal.    Mouth/Throat:     Pharynx: Oropharynx is clear.  Eyes:     Conjunctiva/sclera: Conjunctivae normal.  Cardiovascular:     Rate and Rhythm: Normal rate and regular rhythm.     Heart sounds: Normal heart sounds. No murmur heard. Pulmonary:     Effort: Pulmonary effort is normal.     Breath sounds: Normal breath sounds.     Comments: Lungs clear to auscultation Musculoskeletal:     Cervical back: Normal range of motion and neck supple.  Skin:    Comments: Red, raised rash noted on neck, shoulders, and chest. No open areas or drainage noted  Neurological:     Mental Status: She is alert and oriented to person, place, and time.  Psychiatric:        Mood and Affect: Mood normal.        Behavior: Behavior normal.        Thought Content: Thought content normal.        Judgment: Judgment normal.      Assessment and Plan: 1. Hypogammaglobulinemia ([place])   2. Severe persistent asthma without  complication   3. Non-allergic rhinitis   4. Papular urticaria     Meds ordered this encounter  Medications   methylPREDNISolone acetate (DEPO-MEDROL) injection 40 mg    Patient Instructions  Rash DepoMedrol 40 mg IM given in the clinic Prednisone 10 mg tablets. Take 2 tablets once a day for 4 days, then take 1 tablet on the 5th day, then stop.   Take the least amount of medications while remaining rash free Cetirizine (Zyrtec) '10mg'$  twice a day and famotidine (Pepcid) 20 mg twice a day. If no symptoms for 7-14 days then decrease to. Cetirizine (Zyrtec) '10mg'$  twice a day and famotidine (Pepcid) 20 mg once a day.  If no symptoms for 7-14 days then  decrease to. Cetirizine (Zyrtec) '10mg'$  twice a day.  If no symptoms for 7-14 days then decrease to. Cetirizine (Zyrtec) '10mg'$  once a day.  May use Benadryl (diphenhydramine) as needed for breakthrough hives       If symptoms return, then step up dosage Triamcinolone 0.1% ointment to red and itchy areas below your face up to twice a day as needed. Do not use this medication for longer than 2 weeks in a row. Keep a detailed symptom journal including foods eaten, contact with allergens, medications taken, weather changes.    Asthma Continue Breztri 2 puffs twice a day with a spacer to prevent cough or wheeze Continue albuterol 2 puffs once every 4 hours as needed for cough or wheeze You may use albuterol 2 puffs 5 to 15 minutes before activity to decrease cough or wheeze Continue Pulmicort as needed for asthma flare for 1 to 2 weeks or until cough and wheeze free Continue Fasenra injections once every 8 weeks to control asthma symptoms  Chronic rhinitis Continue montelukast 10 mg once a day Continue ipratropium up to 2 sprays in each nostril twice a day as needed for runny nose  Specific antibody deficiency Continue IgG infusions once a month as you have been. We will contact our biologics coordinator, Tammy, to try to get a formulation with fewer side effects  Call the clinic if this treatment plan is not working well for you.  Follow up in 2 months or sooner if needed.   Return in about 2 months (around 03/02/2022), or if symptoms worsen or fail to improve.    Thank you for the opportunity to care for this patient.  Please do not hesitate to contact me with questions.  Gareth Morgan, FNP [facility]

## 2021-12-31 NOTE — Telephone Encounter (Signed)
-----   Message from Dara Hoyer, FNP sent at 12/31/2021  1:43 PM EDT ----- Hi Giorgio Chabot, Is there any way to find out if this patient had a substitution of her last Pervigin? She is having a hive flare currently. Her last infusion was on Monday. Thank you

## 2021-12-31 NOTE — Telephone Encounter (Signed)
Thank you :)

## 2021-12-31 NOTE — Telephone Encounter (Signed)
Patient called stating she is having a bad rash after her infusions. Patient is going to send pictures via allergy and asthma E-Mail. I will fwd them over to you Dr Ernst Bowler.

## 2021-12-31 NOTE — Telephone Encounter (Signed)
Kathy Tran took excellent care of her today!   Salvatore Marvel, MD [facility]ergy [facility]

## 2021-12-31 NOTE — Patient Instructions (Addendum)
Rash DepoMedrol 40 mg IM given in the clinic Prednisone 10 mg tablets. Take 2 tablets once a day for 4 days, then take 1 tablet on the 5th day, then stop.   Take the least amount of medications while remaining rash free Cetirizine (Zyrtec) '10mg'$  twice a day and famotidine (Pepcid) 20 mg twice a day. If no symptoms for 7-14 days then decrease to. Cetirizine (Zyrtec) '10mg'$  twice a day and famotidine (Pepcid) 20 mg once a day.  If no symptoms for 7-14 days then decrease to. Cetirizine (Zyrtec) '10mg'$  twice a day.  If no symptoms for 7-14 days then decrease to. Cetirizine (Zyrtec) '10mg'$  once a day.  May use Benadryl (diphenhydramine) as needed for breakthrough hives       If symptoms return, then step up dosage Triamcinolone 0.1% ointment to red and itchy areas below your face up to twice a day as needed. Do not use this medication for longer than 2 weeks in a row. Keep a detailed symptom journal including foods eaten, contact with allergens, medications taken, weather changes.    Asthma Continue Breztri 2 puffs twice a day with a spacer to prevent cough or wheeze Continue albuterol 2 puffs once every 4 hours as needed for cough or wheeze You may use albuterol 2 puffs 5 to 15 minutes before activity to decrease cough or wheeze Continue Pulmicort as needed for asthma flare for 1 to 2 weeks or until cough and wheeze free Continue Fasenra injections once every 8 weeks to control asthma symptoms  Chronic rhinitis Continue montelukast 10 mg once a day Continue ipratropium up to 2 sprays in each nostril twice a day as needed for runny nose  Specific antibody deficiency Continue IgG infusions once a month as you have been. We will contact our biologics coordinator, Tammy, to try to get a formulation with fewer side effects  Call the clinic if this treatment plan is not working well for you.  Follow up in 2 months or sooner if needed.

## 2021-12-31 NOTE — Telephone Encounter (Signed)
Called pharmacy to check on  rx sent to patient for infusion and she received same meds and Privigen

## 2022-01-01 NOTE — Telephone Encounter (Signed)
What else is on formulary for her? Can we try Asceniv? She does get some respiratory infections, so this might be helpful for her.   Salvatore Marvel, MD All[facility] and [facility]

## 2022-01-03 NOTE — Telephone Encounter (Signed)
I have reached out to [facility] in reference to changing to Asceniv.  They are going to check on if they can acquire same, if they cannot get it can we use [facility] instead. I have reached out to patient and she is due for next infusion 9/11 so I advised her we are in process of this change for her.

## 2022-01-03 NOTE — Telephone Encounter (Signed)
Great - thanks Tam Tam!   Salvatore Marvel, MD Allergy and [facility]

## 2022-01-11 DIAGNOSIS — M9903 Segmental and somatic dysfunction of lumbar region: Secondary | ICD-10-CM | POA: Diagnosis not present

## 2022-01-11 DIAGNOSIS — M546 Pain in thoracic spine: Secondary | ICD-10-CM | POA: Diagnosis not present

## 2022-01-11 DIAGNOSIS — M9902 Segmental and somatic dysfunction of thoracic region: Secondary | ICD-10-CM | POA: Diagnosis not present

## 2022-01-11 DIAGNOSIS — M9901 Segmental and somatic dysfunction of cervical region: Secondary | ICD-10-CM | POA: Diagnosis not present

## 2022-01-11 DIAGNOSIS — M542 Cervicalgia: Secondary | ICD-10-CM | POA: Diagnosis not present

## 2022-01-18 ENCOUNTER — Other Ambulatory Visit: Payer: Self-pay

## 2022-01-18 MED ORDER — MONTELUKAST SODIUM 10 MG PO TABS: 10.0000 mg | ORAL_TABLET | Freq: Every day | ORAL | 5 refills | Status: DC

## 2022-01-18 NOTE — Telephone Encounter (Signed)
Patient called stating she needed a refill on the Montelukast sent to the [facility]. I reviewed her last AVS and sent in the refill for Montelukast.

## 2022-01-21 DIAGNOSIS — D839 Common variable immunodeficiency, unspecified: Secondary | ICD-10-CM | POA: Diagnosis not present

## 2022-02-18 DIAGNOSIS — D839 Common variable immunodeficiency, unspecified: Secondary | ICD-10-CM | POA: Diagnosis not present

## 2022-02-22 ENCOUNTER — Ambulatory Visit (INDEPENDENT_AMBULATORY_CARE_PROVIDER_SITE_OTHER): Payer: Medicare HMO

## 2022-02-22 DIAGNOSIS — J4550 Severe persistent asthma, uncomplicated: Secondary | ICD-10-CM | POA: Diagnosis not present

## 2022-02-27 ENCOUNTER — Telehealth: Payer: Self-pay | Admitting: Allergy & Immunology

## 2022-02-27 DIAGNOSIS — T783XXD Angioneurotic edema, subsequent encounter: Secondary | ICD-10-CM

## 2022-02-27 NOTE — Telephone Encounter (Signed)
New IgE Immunoglobulin medication is making her retain a ton of fluid.  She states she has gained 20 plus pounds and it is all fluid.  She states when she blinks hard water comes out and she feels flushed all the times and states it has become really bothersome.  She would like to know what do about this.  Please advise.

## 2022-02-28 NOTE — Telephone Encounter (Signed)
I have no clue??? She has been on Hyqvia, Cuvitru, privigen and now Asceniv.

## 2022-02-28 NOTE — Telephone Encounter (Signed)
Any ideas on this?   Kathy Tran

## 2022-03-01 ENCOUNTER — Ambulatory Visit: Payer: Medicare HMO | Admitting: Pulmonary Disease

## 2022-03-01 ENCOUNTER — Encounter: Payer: Self-pay | Admitting: Pulmonary Disease

## 2022-03-01 DIAGNOSIS — J4550 Severe persistent asthma, uncomplicated: Secondary | ICD-10-CM

## 2022-03-01 DIAGNOSIS — D801 Nonfamilial hypogammaglobulinemia: Secondary | ICD-10-CM | POA: Diagnosis not present

## 2022-03-01 DIAGNOSIS — T783XXD Angioneurotic edema, subsequent encounter: Secondary | ICD-10-CM | POA: Diagnosis not present

## 2022-03-01 NOTE — Assessment & Plan Note (Signed)
She feels that change in formulation may have caused fluid retention.  She was started on Lasix today I advised her to take potassium supplements

## 2022-03-01 NOTE — Progress Notes (Signed)
   Subjective:    Patient ID: Kathy Tran, female    DOB: 05-22-1955, 66 y.o.   MRN: [id number]  HPI  66 yo  never smoker  for FU of adult onset asthma,  She sees Dr. 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   There is no childhood history of asthma. Symptom onset was in 2018 after an attack of the flu.  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 Repeat IgG testing showed normal levels of 794 09/2018 and 838 and 07/2018  01/2021 dc'd budesonide due to frequent thrush  Chief Complaint  Patient presents with   Follow-up    She feels that she has sinius pressure and feels that she may have fluid off her lungs she was started on lasix this morning.    She has done well over the past 6 months, no exacerbations.  She remains on immunoglobulin infusions every 4 weeks -she feels that she has developed fluid overload and her face and hands seem puffy.  Lasix has been prescribed by allergist and she is to start this today. Her weight has gone up from 145 to 159 pounds Breathing is okay She also remains on Fasenra injections, compliant with Breztri  Significant tests/ events reviewed  Spirometry 04/2019  no airway obstruction with FEV1 79% and FVC 78%   CT angiogram chest 09/2016 nodular  opacities in the right lung largest 8 x 6 mm subpleural right upper lobe   PFTs 01/2017 moderate airway obstruction with ratio 68, FEV1 1.52/63%, FVC 71%, no bronchodilator response, TLC 90% and DLCO 79%   Serial spirometry 2019 and 2020 shows no airway obstruction with FEV1 ranging from 65% to 113%   01/2017 RAST neg, IgE <2 AEC 09/2018 100  Review of Systems neg for any significant sore throat, dysphagia, itching, sneezing, nasal congestion or excess/ purulent secretions, fever, chills, sweats, unintended wt loss, pleuritic or exertional cp, hempoptysis, orthopnea pnd or change in chronic leg swelling. Also denies presyncope, palpitations, heartburn, abdominal pain, nausea, vomiting, diarrhea or change in bowel or urinary habits, dysuria,hematuria, rash, arthralgias, visual complaints, headache, numbness weakness or ataxia.     Objective:   Physical Exam  Gen. Pleasant, well-nourished, in no distress ENT - no thrush, no pallor/icterus,no post nasal drip Neck: No JVD, no thyromegaly, no carotid bruits Lungs: no use of accessory muscles, no dullness to percussion, clear without rales or rhonchi  Cardiovascular: Rhythm regular, heart sounds  normal, no murmurs or gallops, no peripheral edema Musculoskeletal: No deformities, no cyanosis or clubbing        Assessment & Plan:

## 2022-03-01 NOTE — Patient Instructions (Signed)
  Take lasix as directed

## 2022-03-01 NOTE — Telephone Encounter (Signed)
Called patient and l/m advising change back to [employer] from [employer]

## 2022-03-01 NOTE — Assessment & Plan Note (Signed)
Continue Breztri. Continue Fasenra injections every 8 weeks continue Singulair and albuterol for rescue

## 2022-03-01 NOTE — Telephone Encounter (Signed)
I called the patient to discuss her symptoms. She reports that she is very bloated from the immunoglobulin. She has gained 7 pounds. She has not changed her diet. She reports that her eyes were swollen shut. She only had the issues with the infusions in September. Her next infusion is due October. Her weight was 147 and then it jumped up to 154. She did the one in October and she is up to 156. This is her second infusion with her new formulation.   She denies heart issues. She was born with a murmur and they keep a check on this. She is on HCTZ. Her next treatment is November 6th. She has been on HCTZ for decades.   Also of note, she has rash which did not get as bad. She noticed this and she added hydrocortisone 1% and Benadryl. She thinks that it has something to do with the sun. This rash occurred 6 days after the infusion. This exposure to the sun typically triggers this.   She bought a tank top and put this under the bathing suit to avoid the sun getting on her back and neck. It took two weeks for this to clear up. She is going to go to Dr. Elsworth Soho this morning.   She goes to Dr. Willey Blade at the end of November. She has Lasix at her home. She was on this from Dr. Luan Pulling from when she got so sick in 2018. She has '40mg'$  doses.  I recommended that she take one tablet daily for one week. Then she will call us with an update.  She does prefer to go back to her previous formulation. She thinks that her rash was better with the previous formulation. She is going to avoid the sun for one week after her future treatment.

## 2022-03-02 LAB — CMP14+EGFR
ALT: 16 IU/L (ref 0–32)
AST: 16 IU/L (ref 0–40)
Albumin/Globulin Ratio: 2.2 (ref 1.2–2.2)
Albumin: 4.7 g/dL (ref 3.9–4.9)
Alkaline Phosphatase: 55 IU/L (ref 44–121)
BUN/Creatinine Ratio: 24 (ref 12–28)
BUN: 18 mg/dL (ref 8–27)
Bilirubin Total: 0.7 mg/dL (ref 0.0–1.2)
CO2: 24 mmol/L (ref 20–29)
Calcium: 9.5 mg/dL (ref 8.7–10.3)
Chloride: 103 mmol/L (ref 96–106)
Creatinine, Ser: 0.74 mg/dL (ref 0.57–1.00)
Globulin, Total: 2.1 g/dL (ref 1.5–4.5)
Glucose: 99 mg/dL (ref 70–99)
Potassium: 4.3 mmol/L (ref 3.5–5.2)
Sodium: 140 mmol/L (ref 134–144)
Total Protein: 6.8 g/dL (ref 6.0–8.5)
eGFR: 91 mL/min/{1.73_m2} (ref >59)

## 2022-03-02 LAB — BRAIN NATRIURETIC PEPTIDE: BNP: 48.1 pg/mL (ref 0.0–100.0)

## 2022-03-04 NOTE — Telephone Encounter (Signed)
Thanks, Tammy! I know what a pain that is.   Salvatore Marvel, MD [facility] [place]

## 2022-03-05 ENCOUNTER — Other Ambulatory Visit (HOSPITAL_COMMUNITY): Payer: Self-pay | Admitting: Internal Medicine

## 2022-03-05 ENCOUNTER — Encounter: Payer: Self-pay | Admitting: Allergy & Immunology

## 2022-03-05 DIAGNOSIS — Z1231 Encounter for screening mammogram for malignant neoplasm of breast: Secondary | ICD-10-CM

## 2022-03-18 ENCOUNTER — Encounter: Payer: Self-pay | Admitting: Internal Medicine

## 2022-03-18 ENCOUNTER — Ambulatory Visit (INDEPENDENT_AMBULATORY_CARE_PROVIDER_SITE_OTHER): Payer: Medicare HMO | Admitting: Internal Medicine

## 2022-03-18 ENCOUNTER — Other Ambulatory Visit: Payer: Self-pay | Admitting: Internal Medicine

## 2022-03-18 DIAGNOSIS — J019 Acute sinusitis, unspecified: Secondary | ICD-10-CM

## 2022-03-18 DIAGNOSIS — D806 Antibody deficiency with near-normal immunoglobulins or with hyperimmunoglobulinemia: Secondary | ICD-10-CM

## 2022-03-18 DIAGNOSIS — J4550 Severe persistent asthma, uncomplicated: Secondary | ICD-10-CM | POA: Diagnosis not present

## 2022-03-18 DIAGNOSIS — D801 Nonfamilial hypogammaglobulinemia: Secondary | ICD-10-CM

## 2022-03-18 DIAGNOSIS — D839 Common variable immunodeficiency, unspecified: Secondary | ICD-10-CM | POA: Diagnosis not present

## 2022-03-18 MED ORDER — AMOXICILLIN-POT CLAVULANATE 875-125 MG PO TABS: 1.0000 | ORAL_TABLET | Freq: Two times a day (BID) | ORAL | 0 refills | Status: AC

## 2022-03-18 MED ORDER — FLUCONAZOLE 150 MG PO TABS
150.0000 mg | ORAL_TABLET | Freq: Every day | ORAL | 0 refills | Status: DC
Start: 2022-03-18 — End: 2022-09-11

## 2022-03-18 MED ORDER — AZELASTINE HCL 0.1 % NA SOLN
2.0000 | Freq: Two times a day (BID) | NASAL | 0 refills | Status: DC
Start: 2022-03-18 — End: 2022-03-19

## 2022-03-18 NOTE — Progress Notes (Signed)
RE: Kathy Tran MRN: [id number] DOB: Oct 08, 1955 Date of Telemedicine Visit: 03/18/2022  Referring provider: Asencion Noble, MD Primary care provider: Asencion Noble, MD  Chief Complaint: Sinusitis (Possible sinus infection/Headaches near eyes,forehead, green phlegm,right ear ache started Saturday,sneezing,sore throat,SOB,clearing throat frequently/-Has been taking benadryl,mucinex,tylenol )   Telemedicine Follow Up Visit via Telephone: I connected with Kathy Tran for a follow up on 03/18/22 by telephone and verified that I am speaking with the correct person using two identifiers.   I discussed the limitations, risks, security and privacy concerns of performing an evaluation and management service by telephone and the availability of in person appointments. I also discussed with the patient that there may be a patient responsible charge related to this service. The patient expressed understanding and agreed to proceed.  Patient is at home accompanied who provided/contributed to the history.  Provider is at the office.  Visit start time: 3:30 PM Visit end time: 3:47 PM Insurance consent/check in by: front desk Medical consent and medical assistant/nurse: Mariah  History of Present Illness:  She is a 66 y.o. 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 Her previous allergy office visit was in 12/31/2021 with Gareth Morgan for rash- given prednisone.    Reports since Saturday, she has had sore throat, right ear ache, stuffy ears, bad headaches especially behind her eyes, green mucous production, congestion and rhinorrhea.  No fevers.  She has had multiple sick contacts.  Of note, she is on IVIG for SAD and recently had her infusion.  She does believe this feels like an early sinus infection and in the past, has required antibiotics to clear it and diflucan due to yeast vaginal infection from abx.  No lower respiratory symptoms but she is  worried about it.  On Breztri 2 puffs BID and Fasenra.  She was told by her regular doctor, Dr. Ernst Bowler to start Pulmicort nebs whenever sick but has not done so.   Otherwise, there have been no changes to her past medical history, surgical history, family history, or social history.  Assessment and Plan:  Larin is a 66 y.o. 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  Upper Respiratory Infection - Use nasal saline rinses before nose sprays such as with Neilmed Sinus Rinse.  Use distilled water.   - Use Azelastine 1-2 sprays each nostril twice daily. Aim upward and outward. - Use Ipratroprium 1-2 sprays up to four times daily as needed for runny nose. Aim upward and outward. - Use Zyrtec 10 mg daily instead of benadryl. - If no improvement in 2 days, start Augmentin 875-168m BID for 5 days.  Will also send in Diflucan [id number] as she generally gets a yeast infection with abx.  Severe Persistent Asthma - Daily controller medication(s): Breztri two puffs twice daily + Fasenra every 8 weeks - Rescue medications: ProAir 4 puffs every 4-6 hours as needed or DuoNeb nebulizer one vial every 4-6 hours as needed  - Changes during respiratory flares: Add on Pulmicort (budesonide) 0.5 mg twice daily via nebulizer for 1 week    Diagnostics: None.  Medication List:  Current Outpatient Medications  Medication Sig Dispense Refill   Alcohol Swabs (B-D SINGLE USE SWABS REGULAR) PADS      amLODipine (NORVASC)  5 MG tablet TAKE 1 TABLET BY MOUTH AT BEDTIME     aspirin EC 81 MG tablet Take 1 tablet (81 mg total) by mouth 2 (two) times daily. 60 tablet 0   Benralizumab (FASENRA) 30 MG/ML SOSY Inject 1 mL (30 mg total) into the skin every 8 (eight)  weeks. 1 mL 6   Blood Glucose Calibration (TRUE METRIX LEVEL 1) Low SOLN      Blood Glucose Monitoring Suppl (TRUE METRIX AIR GLUCOSE METER) w/Device KIT      BREZTRI AEROSPHERE 160-9-4.8 MCG/ACT AERO INHALE 2 PUFFS INTO THE LUNGS TWICE DAILY WITH SPACER. RINSE, GARGLE AND SPIT AFTER USE. 10.7 g 5   budesonide (PULMICORT) 0.5 MG/2ML nebulizer solution Add twice daily for 1-2 weeks during respiratory flares. (Patient taking differently: Take 0.5 mg by nebulization 2 (two) times daily as needed (Add twice daily for 1-2 weeks during respiratory flares.).) 120 mL 1   carvedilol (COREG) 25 MG tablet Take 25 mg by mouth 2 (two) times daily with a meal.     diphenhydrAMINE (BENADRYL) 25 MG tablet Take 25 mg by mouth at bedtime.     DROPLET PEN NEEDLES 32G X 4 MM MISC      famotidine (PEPCID) 20 MG tablet Take 20 mg by mouth at bedtime.     guaiFENesin (MUCINEX) 600 MG 12 hr tablet Take 1 tablet (600 mg total) by mouth 2 (two) times daily. 60 tablet 1   hydrochlorothiazide (HYDRODIURIL) 25 MG tablet Take 25 mg by mouth daily.     insulin aspart (NOVOLOG FLEXPEN) 100 UNIT/ML FlexPen Inject 2 Units into the skin 3 (three) times daily with meals. Per sliding scale     ipratropium (ATROVENT) 0.03 % nasal spray USE 2 SPRAYS IN EACH NOSTRIL THREE TIMES DAILY (Patient taking differently: Place 2 sprays into both nostrils daily as needed for rhinitis.) 90 mL 3   ipratropium-albuterol (DUONEB) 0.5-2.5 (3) MG/3ML SOLN INHALE THE CONTENTS OF 1 VIAL VIA NEBULIZER EVERY FOUR HOURS AS NEEDED. 270 mL 1   LANTUS SOLOSTAR 100 UNIT/ML Solostar Pen Inject 40 Units into the skin in the morning.     LORazepam (ATIVAN) 0.5 MG tablet Take 0.5 mg by mouth at bedtime.     meloxicam (MOBIC) 15 MG tablet Take 15 mg by mouth in the morning.     metFORMIN (GLUCOPHAGE-XR) 500 MG 24 hr tablet Take 2,000 mg by mouth daily with breakfast.     montelukast (SINGULAIR) 10 MG tablet Take 1 tablet (10 mg total) by mouth at bedtime. 30 tablet 5    nystatin (MYCOSTATIN) 100000 UNIT/ML suspension Take 5 mLs (500,000 Units total) by mouth in the morning and at bedtime. Swish and swallow 900 mL 3   pantoprazole (PROTONIX) 40 MG tablet Take 1 tablet (40 mg total) by mouth 2 (two) times daily before a meal. 30 tablet 2   pravastatin (PRAVACHOL) 10 MG tablet Take 10 mg by mouth at bedtime.      PRESCRIPTION MEDICATION Inject into the vein every 28 (twenty-eight) days. Immunoglobulin Hormone replacement therapy     TRUE METRIX BLOOD GLUCOSE TEST test strip      TRUEplus Lancets 33G MISC      valsartan (DIOVAN) 320 MG tablet Take 320 mg by mouth at bedtime.     VENTOLIN HFA 108 (90 Base) MCG/ACT inhaler INHALE 2 PUFFS EVERY 4 HOURS AS NEEDED FOR WHEEZING OR SHORTNESS OF BREATH. 18 g 1   fluconazole (DIFLUCAN) 150 MG tablet Take 150 mg by mouth  once. (Patient not taking: Reported on 03/18/2022)     Current Facility-Administered Medications  Medication Dose Route Frequency Provider Last Rate Last Admin   Benralizumab SOSY 30 mg  30 mg Subcutaneous Q8 Toney Reil, MD   30 mg at 02/22/22 1828   Allergies: Allergies  Allergen Reactions   Sulfa Antibiotics Anaphylaxis   Hydrocodone Nausea And Vomiting   I reviewed her past medical history, social history, family history, and environmental history and no significant changes have been reported from previous visits.  Review of Systems  Objective:  Physical exam not obtained as encounter was done via telephone.   Previous notes and tests were reviewed.  I discussed the assessment and treatment plan with the patient. The patient was provided an opportunity to ask questions and all were answered. The patient agreed with the plan and demonstrated an understanding of the instructions.   The patient was advised to call back or seek an in-person evaluation if the symptoms worsen or if the condition fails to improve as anticipated.  I provided 17 minutes of non-face-to-face time  during this encounter.  Harlon Flor, MD [facility] [place]

## 2022-03-20 ENCOUNTER — Ambulatory Visit (HOSPITAL_COMMUNITY): Payer: Medicare HMO

## 2022-03-21 ENCOUNTER — Ambulatory Visit (HOSPITAL_COMMUNITY)
Admission: RE | Admit: 2022-03-21 | Discharge: 2022-03-21 | Disposition: A | Discharge status: Home / Self Care | Payer: Medicare HMO | Source: Ambulatory Visit | Attending: Internal Medicine | Admitting: Internal Medicine

## 2022-03-21 DIAGNOSIS — Z1231 Encounter for screening mammogram for malignant neoplasm of breast: Secondary | ICD-10-CM | POA: Insufficient documentation

## 2022-04-02 DIAGNOSIS — N200 Calculus of kidney: Secondary | ICD-10-CM | POA: Diagnosis not present

## 2022-04-02 DIAGNOSIS — J4550 Severe persistent asthma, uncomplicated: Secondary | ICD-10-CM | POA: Diagnosis not present

## 2022-04-02 DIAGNOSIS — D801 Nonfamilial hypogammaglobulinemia: Secondary | ICD-10-CM | POA: Diagnosis not present

## 2022-04-02 DIAGNOSIS — E1159 Type 2 diabetes mellitus with other circulatory complications: Secondary | ICD-10-CM | POA: Diagnosis not present

## 2022-04-02 DIAGNOSIS — E785 Hyperlipidemia, unspecified: Secondary | ICD-10-CM | POA: Diagnosis not present

## 2022-04-02 DIAGNOSIS — I341 Nonrheumatic mitral (valve) prolapse: Secondary | ICD-10-CM | POA: Diagnosis not present

## 2022-04-02 DIAGNOSIS — Z79899 Other long term (current) drug therapy: Secondary | ICD-10-CM | POA: Diagnosis not present

## 2022-04-02 DIAGNOSIS — K219 Gastro-esophageal reflux disease without esophagitis: Secondary | ICD-10-CM | POA: Diagnosis not present

## 2022-04-02 DIAGNOSIS — I10 Essential (primary) hypertension: Secondary | ICD-10-CM | POA: Diagnosis not present

## 2022-04-11 DIAGNOSIS — Z23 Encounter for immunization: Secondary | ICD-10-CM | POA: Diagnosis not present

## 2022-04-11 DIAGNOSIS — E1169 Type 2 diabetes mellitus with other specified complication: Secondary | ICD-10-CM | POA: Diagnosis not present

## 2022-04-11 DIAGNOSIS — I10 Essential (primary) hypertension: Secondary | ICD-10-CM | POA: Diagnosis not present

## 2022-04-11 DIAGNOSIS — Z0000 Encounter for general adult medical examination without abnormal findings: Secondary | ICD-10-CM | POA: Diagnosis not present

## 2022-04-11 DIAGNOSIS — J4550 Severe persistent asthma, uncomplicated: Secondary | ICD-10-CM | POA: Diagnosis not present

## 2022-04-11 DIAGNOSIS — E785 Hyperlipidemia, unspecified: Secondary | ICD-10-CM | POA: Diagnosis not present

## 2022-04-11 DIAGNOSIS — R7309 Other abnormal glucose: Secondary | ICD-10-CM | POA: Diagnosis not present

## 2022-04-15 DIAGNOSIS — D839 Common variable immunodeficiency, unspecified: Secondary | ICD-10-CM | POA: Diagnosis not present

## 2022-04-19 ENCOUNTER — Encounter: Payer: Self-pay | Admitting: Allergy & Immunology

## 2022-04-19 ENCOUNTER — Ambulatory Visit: Payer: Medicare HMO

## 2022-04-19 ENCOUNTER — Ambulatory Visit (INDEPENDENT_AMBULATORY_CARE_PROVIDER_SITE_OTHER): Payer: Medicare HMO | Admitting: Allergy & Immunology

## 2022-04-19 VITALS — BP 108/66 | HR 72 | Temp 98.2°F | Resp 18 | Ht 63.0 in | Wt 160.4 lb

## 2022-04-19 DIAGNOSIS — L282 Other prurigo: Secondary | ICD-10-CM

## 2022-04-19 DIAGNOSIS — D806 Antibody deficiency with near-normal immunoglobulins or with hyperimmunoglobulinemia: Secondary | ICD-10-CM

## 2022-04-19 DIAGNOSIS — J4550 Severe persistent asthma, uncomplicated: Secondary | ICD-10-CM

## 2022-04-19 DIAGNOSIS — R5383 Other fatigue: Secondary | ICD-10-CM | POA: Diagnosis not present

## 2022-04-19 MED ORDER — PREDNISONE 10 MG PO TABS: ORAL_TABLET | ORAL | 0 refills | Status: DC

## 2022-04-19 NOTE — Progress Notes (Signed)
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  - I would add on the albuterol mixed with budesonide three times daily for 1-2 weeks. - Daily controller medication(s): Breztri two puffs twice daily + Fasenra every 8 weeks - Rescue medications: ProAir 4 puffs every 4-6 hours as needed or DuoNeb nebulizer one vial every 4-6 hours as needed  - Changes during respiratory flares: Add on Pulmicort (budesonide) 0.5 mg twice daily via nebulizer for 1-2 weeks - Asthma control goals:  * Full participation in all desired activities (may need albuterol before activity) * Albuterol use two time or less a week on average (not counting use with activity) * Cough interfering with sleep two time or less a month * Oral steroids no more than once a year * No hospitalizations     2. Chronic non-allergic rhinitis - Continue with Benadryl '25mg'$  at night.  - You did have clear fluid behind both tympanic membranes. - Start the prednisone taper that I sent into the pharmacy.  - This will help with inflammation and allow the Eustachian tube to function.   3. 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  - Continue with all of the premedications, including hydration.   4. Return in about 6 months (around 10/19/2022).   Subjective:   Kathy Tran is a 66 y.o. female presenting today for follow up of  Chief Complaint  Patient presents with   Other    Just finished her 2nd round of amox and is doing neb treatments. Feels like she has been keeping a cold. Her right ear hurts and has constant  sinus drainage.     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 ([place]) 02/27/2018   Hx of colonic polyps 01/07/2018   Postmenopausal 07/02/2017   Hypogammaglobulinemia ([place]) 04/29/2017   Recurrent infections 04/29/2017   Non-allergic rhinitis 04/29/2017   Severe persistent asthma without complication 40/81/4481   Essential hypertension, benign 03/20/2017   Asthma exacerbation 12/26/2016   Asthmatic bronchitis 07/18/2016   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  Kathy Tran is a 66 y.o. female presenting for a follow up visit. She was last seen in our clinic in November 2023 via televisit. At that time, she contacted Dr. Posey Pronto to discuss a sinus infection that she was developing. She was started on Augmentin twice daily for five days. She was continued on her nasal sprays for her NAR as well as her albuterol mixed with Pulmicort.   She got sick one month ago. She saw Dr. Posey Pronto and she was given a course of Augmentin for five days. She was good after that and she got another course (7 days this time). This helped a  bit, but still she is heavy and SOB. She feels that she is getting ready to have an asthma attack. She has not kept up with her albuterol mixed with budesonide treatments; she has been doing only albuterol a couple of times per day maximum.   Asthma/Respiratory Symptom History: Currently she is doing  the albuterol nebulizer treatment once or twice a day. She remains on her Breztri two puffs twice daily. She is also doing the [place].   Allergic Rhinitis Symptom History: She is having  less of the sinus pressure, if any. She reports that the sinus headaches and the facial pain improved.  Aside from this recent episode, she has not been on antibiotics in quite some time.   Infusions are going well.  She is not having any adverse reactions to it.  She has a same premedication regimen. We changed her to the different IVIG formulation because she was developing an upper body rash within a week of her infusions. Unfortunately, she developed the rash with the new formulation as well and wanted to just change back. She has been managing this with more antihistamine and using coverings to made sure that si is protected from the sun (she has noticed that sun exposure tends to make it worse overall.   Otherwise, there have been no changes to her past medical history, surgical history, family history, or social history.    Review of Systems  Constitutional: Negative.  Negative for chills, fever, malaise/fatigue and weight loss.  HENT: Negative.  Negative for congestion, ear discharge and ear pain.   Eyes:  Negative for pain, discharge and redness.  Respiratory:  Negative for cough, sputum production, shortness of breath and wheezing.   Cardiovascular: Negative.  Negative for chest pain and palpitations.  Gastrointestinal:  Negative for abdominal pain, constipation, diarrhea, heartburn, nausea and vomiting.  Skin: Negative.  Negative for itching and rash.  Neurological:  Negative for dizziness and headaches.  Endo/Heme/Allergies:  Negative for environmental allergies. Does not bruise/bleed easily.       Objective:   Blood pressure 108/66, pulse 72, temperature 98.2 F (36.8 C), resp. rate 18, height '5\' 3"'$  (1.6 m), weight 160 lb 6 oz (72.7 kg), SpO2 97 %. Body mass index is 28.41 kg/m.    Physical Exam Vitals reviewed.  Constitutional:      Appearance: She is well-developed.     Comments: Very pleasant and talkative.   HENT:     Head: Normocephalic and atraumatic.      Right Ear: Ear canal and external ear normal. A middle ear effusion is present.     Left Ear: Ear canal and external ear normal. A middle ear effusion is present.     Ears:     Comments: She has surgical changes on her right TM from multiple tube placements. There is fluid behind the bilateral TMs.     Nose: No nasal deformity, septal deviation, mucosal edema or rhinorrhea.     Right Turbinates: Enlarged and swollen. Not pale.     Left Turbinates: Enlarged and swollen. Not pale.     Right Sinus: No maxillary sinus tenderness or frontal sinus tenderness.     Left Sinus: No maxillary sinus tenderness or frontal sinus tenderness.     Mouth/Throat:     Mouth: Mucous membranes are not pale and not dry.     Pharynx: Uvula midline.  Eyes:     General: Lids are normal. No allergic shiner.       Right eye:  No discharge.        Left eye: No discharge.     Conjunctiva/sclera: Conjunctivae normal.     Right eye: Right conjunctiva is not injected. No chemosis.    Left eye: Left conjunctiva is not injected. No chemosis.    Pupils: Pupils are equal, round, and reactive to light.  Cardiovascular:     Rate and Rhythm: Normal rate and regular rhythm.     Heart sounds: Normal heart sounds.  Pulmonary:     Effort: Pulmonary effort is normal. No tachypnea, accessory muscle usage or respiratory distress.     Breath sounds: Normal breath sounds. No wheezing, rhonchi or rales.     Comments: Air well in all lung fields. Chest:     Chest wall: No tenderness.  Lymphadenopathy:     Cervical: No cervical adenopathy.  Skin:    General: Skin is warm.     Capillary Refill: Capillary refill takes less than 2 seconds.     Coloration: Skin is not pale.     Findings: No abrasion, erythema, petechiae or rash. Rash is not papular, urticarial or vesicular.     Comments: No eczematous or urticarial lesions noted.  Neurological:     Mental Status: She is alert.  Psychiatric:        Behavior: Behavior is cooperative.       Diagnostic studies:    Spirometry: results normal (FEV1: 1.66/74%, FVC: 2.06/73%, FEV1/FVC: 81%).    Spirometry consistent with normal pattern.    Allergy Studies: none    Salvatore Marvel, MD  Allergy and [facility] [place]

## 2022-04-19 NOTE — Patient Instructions (Addendum)
1. 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  - I would add on the albuterol mixed with budesonide three times daily for 1-2 weeks. - Daily controller medication(s): Breztri two puffs twice daily + Fasenra every 8 weeks - Rescue medications: ProAir 4 puffs every 4-6 hours as needed or DuoNeb nebulizer one vial every 4-6 hours as needed  - Changes during respiratory flares: Add on Pulmicort (budesonide) 0.5 mg twice daily via nebulizer for 1-2 weeks - Asthma control goals:  * Full participation in all desired activities (may need albuterol before activity) * Albuterol use two time or less a week on average (not counting use with activity) * Cough interfering with sleep two time or less a month * Oral steroids no more than once a year * No hospitalizations     2. Chronic non-allergic rhinitis - Continue with Benadryl '25mg'$  at night.  - You did have clear fluid behind both tympanic membranes. - Start the prednisone taper that I sent into the pharmacy.  - This will help with inflammation and allow the Eustachian tube to function.   3. 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  - Continue with all of the premedications, including hydration.   4. Return in about 6 months (around 10/19/2022).    Please inform us of any Emergency Department visits, hospitalizations, or changes in symptoms. Call us before going to the ED for breathing or allergy symptoms since we might be able to fit you in for a sick visit. Feel free to contact us anytime with any questions, problems, or concerns.  It was a pleasure to see you again today!  Websites that have reliable patient information: 1. American Academy of Asthma, Allergy, and Immunology: www.aaaai.org 2. Food Allergy Research and Education (FARE): foodallergy.org 3. Mothers of Asthmatics: http://www.asthmacommunitynetwork.org 4. American College of Allergy, Asthma, and  Immunology: www.acaai.org   COVID-19 Vaccine Information can be found at: ShippingScam.co.uk For questions related to vaccine distribution or appointments, please email [email or web address] or call 3102801506.   We realize that you might be concerned about having an allergic reaction to the COVID19 vaccines. To help with that concern, WE ARE OFFERING THE COVID19 VACCINES IN OUR OFFICE! Ask the front desk for dates!     "Like" [place] on Facebook and Instagram for our latest updates!      A healthy democracy works best when [employer] participate! Make sure you are registered to vote! If you have moved or changed any of your contact information, you will need to get this updated before voting!  In some cases, you MAY be able to register to vote online: [email or web address]

## 2022-05-03 ENCOUNTER — Ambulatory Visit (INDEPENDENT_AMBULATORY_CARE_PROVIDER_SITE_OTHER): Payer: Medicare HMO | Admitting: Obstetrics & Gynecology

## 2022-05-03 ENCOUNTER — Other Ambulatory Visit (HOSPITAL_COMMUNITY)
Admission: RE | Admit: 2022-05-03 | Discharge: 2022-05-03 | Disposition: A | Discharge status: Home / Self Care | Payer: Medicare HMO | Source: Ambulatory Visit | Attending: Obstetrics & Gynecology | Admitting: Obstetrics & Gynecology

## 2022-05-03 ENCOUNTER — Encounter: Payer: Self-pay | Admitting: Obstetrics & Gynecology

## 2022-05-03 VITALS — BP 121/72 | HR 73 | Ht 62.5 in | Wt 160.0 lb

## 2022-05-03 DIAGNOSIS — Z1212 Encounter for screening for malignant neoplasm of rectum: Secondary | ICD-10-CM | POA: Diagnosis not present

## 2022-05-03 DIAGNOSIS — Z1211 Encounter for screening for malignant neoplasm of colon: Secondary | ICD-10-CM | POA: Diagnosis not present

## 2022-05-03 DIAGNOSIS — Z1151 Encounter for screening for human papillomavirus (HPV): Secondary | ICD-10-CM | POA: Diagnosis not present

## 2022-05-03 DIAGNOSIS — Z01419 Encounter for gynecological examination (general) (routine) without abnormal findings: Secondary | ICD-10-CM | POA: Insufficient documentation

## 2022-05-03 LAB — HEMOCCULT GUIAC POC 1CARD (OFFICE): Fecal Occult Blood, POC: NEGATIVE

## 2022-05-03 NOTE — Progress Notes (Signed)
Subjective:     Kathy Tran is a 66 y.o. female here for a routine exam.  No LMP recorded. Patient is postmenopausal. [id number] Birth Control Method:  BTL Menstrual Calendar(currently): amenorrheic  Current complaints: none.   Current acute medical issues:  COPD   Recent Gynecologic History No LMP recorded. Patient is postmenopausal. Last Pap: 2020,  normal Last mammogram: 2023,  normal  Past Medical History:  Diagnosis Date   Angio-edema    Arthritis    Asthma    Flu march and May 2018   COPD (chronic obstructive pulmonary disease) ([place])    Diabetes mellitus type 2, controlled ([place])    x5 yrs   Eczema    GERD (gastroesophageal reflux disease)    Heart murmur    History of kidney stones    Hypertension    x 5 yrs   Hypogammaglobulinemia ([place])    Pneumonia    Recurrent upper respiratory infection (URI)    Renal disorder    renal stones   Urticaria     Past Surgical History:  Procedure Laterality Date   ADENOIDECTOMY     BIOPSY  04/01/2018   Procedure: BIOPSY;  Surgeon: Rogene Houston, MD;  [facility];  Service: Endoscopy;;   CARPOMETACARPEL SUSPENSION PLASTY Right 04/13/2019   Procedure: CARPOMETACARPEL [facility]) SUSPENSION PLASTY RIGHT THUMB;  Surgeon: Daryll Brod, MD;  Location: [place];  Service: Orthopedics;  Laterality: Right;  AXILLARY BLOCK   CATARACT EXTRACTION W/PHACO Left 01/19/2018   Procedure: CATARACT EXTRACTION PHACO AND INTRAOCULAR LENS PLACEMENT LEFT EYE;  Surgeon: Tonny Branch, MD;  Location: [facility];  Service: Ophthalmology;  Laterality: Left;  CDE: 7.52   CATARACT EXTRACTION W/PHACO Right 02/23/2018   Procedure: CATARACT EXTRACTION PHACO AND INTRAOCULAR LENS PLACEMENT (IOC);  Surgeon: Tonny Branch, MD;  Location: [facility];  Service: Ophthalmology;  Laterality: Right;  CDE: 5.47   [place], 1986   COLONOSCOPY  03/27/2011   Procedure: COLONOSCOPY;  Surgeon: Rogene Houston, MD;  [facility];  Service:  Endoscopy;  Laterality: N/A;  2:00   COLONOSCOPY N/A 04/01/2018   Procedure: COLONOSCOPY;  Surgeon: Rogene Houston, MD;  [facility];  Service: Endoscopy;  Laterality: N/A;  [place] Left 11/08/2015   Procedure: CYSTOSCOPY, URETEROSCOPY, WITH STONE BASKET EXTRACTION;  Surgeon: Raynelle Bring, MD;  Location: [facility];  Service: Urology;  Laterality: Left;   CYSTOSCOPY/RETROGRADE/URETEROSCOPY/STONE EXTRACTION WITH BASKET Right 07/03/2015   Procedure: CYSTOSCOPY/RETROGRADE/URETEROSCOPY/STONE EXTRACTION WITH BASKET;  Surgeon: Irine Seal, MD;  [facility];  Service: Urology;  Laterality: Right;   ENDOMETRIAL ABLATION     FLEXIBLE BRONCHOSCOPY Bilateral 10/31/2016   Procedure: FLEXIBLE BRONCHOSCOPY WITH PROPOFOL;  Surgeon: Sinda Du, MD;  [facility];  Service: Cardiopulmonary;  Laterality: Bilateral;   FLEXIBLE BRONCHOSCOPY N/A 01/17/2017   Procedure: FLEXIBLE BRONCHOSCOPY WITH PROPOFOL;  Surgeon: Sinda Du, MD;  [facility];  Service: Cardiopulmonary;  Laterality: N/A;   KIDNEY STONE SURGERY     multiple   SHOULDER SURGERY     SINOSCOPY     TONSILLECTOMY     TOTAL HIP ARTHROPLASTY Right 07/17/2021   Procedure: RIGHT TOTAL HIP ARTHROPLASTY ANTERIOR APPROACH;  Surgeon: [name], MD;  [facility];  Service: Orthopedics;  Laterality: Right;   TUBAL LIGATION  1991   TYMPANOSTOMY TUBE PLACEMENT      OB History     Gravida  2  Para  2   Term  2   Preterm      AB      Living  2      SAB      IAB      Ectopic      Multiple      Live Births  2           Social History   Socioeconomic History   Marital status: Married    Spouse name: Not on file   Number of children: Not on file   Years of education: Not on file   Highest education level: Not on file  Occupational History   Not on file  Tobacco Use   Smoking status: Never   Smokeless tobacco: Never  Vaping Use   Vaping  Use: Never used  Substance and Sexual Activity   Alcohol use: Not Currently   Drug use: No   Sexual activity: Yes    Birth control/protection: Surgical, Post-menopausal    Comment: tubal and ablation  Other Topics Concern   Not on file  Social History Narrative   Not on file   Social Determinants of Health   Financial Resource Strain: Low Risk  (05/03/2022)   Overall Financial Resource Strain (CARDIA)    Difficulty of Paying Living Expenses: Not hard at all  Food Insecurity: No Food Insecurity (05/03/2022)   Hunger Vital Sign    Worried About Running Out of Food in the Last Year: Never true    [place] in the Last Year: Never true  Transportation Needs: No Transportation Needs (05/03/2022)   PRAPARE - Hydrologist (Medical): No    Lack of Transportation (Non-Medical): No  Physical Activity: Insufficiently Active (05/03/2022)   Exercise Vital Sign    Days of Exercise per Week: 3 days    Minutes of Exercise per Session: 10 min  Stress: No Stress Concern Present (05/03/2022)   [place]    Feeling of Stress : Not at all  Social Connections: Moderately Isolated (05/03/2022)   Social Connection and Isolation Panel [NHANES]    Frequency of Communication with Friends and Family: Once a week    Frequency of Social Gatherings with Friends and Family: Twice a week    Attends Religious Services: Never    Marine scientist or Organizations: No    Attends Music therapist: Never    Marital Status: Married    Family History  Problem Relation Age of Onset   COPD Mother    Cancer Father      Current Outpatient Medications:    Alcohol Swabs (B-D SINGLE USE SWABS REGULAR) PADS, , Disp: , Rfl:    amLODipine (NORVASC) 5 MG tablet, TAKE 1 TABLET BY MOUTH AT BEDTIME, Disp: , Rfl:    aspirin EC 81 MG tablet, Take 1 tablet (81 mg total) by mouth 2 (two) times daily.,  Disp: 60 tablet, Rfl: 0   azelastine (ASTELIN) 0.1 % nasal spray, PLACE 2 SPRAYS INTO BOTH NOSTRILS TWICE DAILY., Disp: 90 mL, Rfl: 1   Benralizumab (FASENRA) 30 MG/ML SOSY, Inject 1 mL (30 mg total) into the skin every 8 (eight) weeks., Disp: 1 mL, Rfl: 6   Blood Glucose Calibration (TRUE METRIX LEVEL 1) Low SOLN, , Disp: , Rfl:    Blood Glucose Monitoring Suppl (TRUE METRIX AIR GLUCOSE METER) w/Device KIT, , Disp: , Rfl:    BREZTRI AEROSPHERE  160-9-4.8 MCG/ACT AERO, INHALE 2 PUFFS INTO THE LUNGS TWICE DAILY WITH SPACER. RINSE, GARGLE AND SPIT AFTER USE., Disp: 10.7 g, Rfl: 5   budesonide (PULMICORT) 0.5 MG/2ML nebulizer solution, Add twice daily for 1-2 weeks during respiratory flares. (Patient taking differently: Take 0.5 mg by nebulization 2 (two) times daily as needed (Add twice daily for 1-2 weeks during respiratory flares.).), Disp: 120 mL, Rfl: 1   carvedilol (COREG) 25 MG tablet, Take 25 mg by mouth 2 (two) times daily with a meal., Disp: , Rfl:    diphenhydrAMINE (BENADRYL) 25 MG tablet, Take 25 mg by mouth at bedtime., Disp: , Rfl:    DROPLET PEN NEEDLES 32G X 4 MM MISC, , Disp: , Rfl:    famotidine (PEPCID) 20 MG tablet, Take 20 mg by mouth at bedtime., Disp: , Rfl:    fluconazole (DIFLUCAN) 150 MG tablet, Take 150 mg by mouth once., Disp: , Rfl:    fluconazole (DIFLUCAN) 150 MG tablet, Take 1 tablet (150 mg total) by mouth daily., Disp: 1 tablet, Rfl: 0   guaiFENesin (MUCINEX) 600 MG 12 hr tablet, Take 1 tablet (600 mg total) by mouth 2 (two) times daily., Disp: 60 tablet, Rfl: 1   hydrochlorothiazide (HYDRODIURIL) 25 MG tablet, Take 25 mg by mouth daily., Disp: , Rfl:    IMMUNE GLOBULIN, HUMAN, IM, Inject into the muscle. Every 28 days, Disp: , Rfl:    insulin aspart (NOVOLOG FLEXPEN) 100 UNIT/ML FlexPen, Inject 2 Units into the skin 3 (three) times daily with meals. Per sliding scale, Disp: , Rfl:    ipratropium (ATROVENT) 0.03 % nasal spray, USE 2 SPRAYS IN EACH NOSTRIL THREE TIMES  DAILY (Patient taking differently: Place 2 sprays into both nostrils daily as needed for rhinitis.), Disp: 90 mL, Rfl: 3   ipratropium-albuterol (DUONEB) 0.5-2.5 (3) MG/3ML SOLN, INHALE THE CONTENTS OF 1 VIAL VIA NEBULIZER EVERY FOUR HOURS AS NEEDED., Disp: 270 mL, Rfl: 1   LANTUS SOLOSTAR 100 UNIT/ML Solostar Pen, Inject 40 Units into the skin in the morning., Disp: , Rfl:    LORazepam (ATIVAN) 0.5 MG tablet, Take 0.5 mg by mouth at bedtime., Disp: , Rfl:    meloxicam (MOBIC) 15 MG tablet, Take 15 mg by mouth in the morning., Disp: , Rfl:    metFORMIN (GLUCOPHAGE-XR) 500 MG 24 hr tablet, Take 2,000 mg by mouth daily with breakfast., Disp: , Rfl:    montelukast (SINGULAIR) 10 MG tablet, Take 1 tablet (10 mg total) by mouth at bedtime., Disp: 30 tablet, Rfl: 5   nystatin (MYCOSTATIN) 100000 UNIT/ML suspension, Take 5 mLs (500,000 Units total) by mouth in the morning and at bedtime. Swish and swallow, Disp: 900 mL, Rfl: 3   pantoprazole (PROTONIX) 40 MG tablet, Take 1 tablet (40 mg total) by mouth 2 (two) times daily before a meal., Disp: 30 tablet, Rfl: 2   pravastatin (PRAVACHOL) 10 MG tablet, Take 10 mg by mouth at bedtime. , Disp: , Rfl:    predniSONE (DELTASONE) 10 MG tablet, Take two tablets (4m) twice daily for five days, then one tablet (160m twice daily for five days, then one tablet (1092monce daily for five days, then STOP., Disp: 35 tablet, Rfl: 0   PRESCRIPTION MEDICATION, Inject into the vein every 28 (twenty-eight) days. Immunoglobulin Hormone replacement therapy, Disp: , Rfl:    TRUE METRIX BLOOD GLUCOSE TEST test strip, , Disp: , Rfl:    TRUEplus Lancets 33G MISC, , Disp: , Rfl:    TRULICITY 0.78.83/GP/4.9IYPN,  Inject into the skin., Disp: , Rfl:    valsartan (DIOVAN) 320 MG tablet, Take 320 mg by mouth at bedtime., Disp: , Rfl:    VENTOLIN HFA 108 (90 Base) MCG/ACT inhaler, INHALE 2 PUFFS EVERY 4 HOURS AS NEEDED FOR WHEEZING OR SHORTNESS OF BREATH., Disp: 18 g, Rfl: 1  Current  Facility-Administered Medications:    Benralizumab SOSY 30 mg, 30 mg, Subcutaneous, Q8 Weeks, [name], MD, 30 mg at 04/19/22 1039  Review of Systems  Review of Systems  Constitutional: Negative for fever, chills, weight loss, malaise/fatigue and diaphoresis.  HENT: Negative for hearing loss, ear pain, nosebleeds, congestion, sore throat, neck pain, tinnitus and ear discharge.   Eyes: Negative for blurred vision, double vision, photophobia, pain, discharge and redness.  Respiratory: Negative for cough, hemoptysis, sputum production, shortness of breath, wheezing and stridor.   Cardiovascular: Negative for chest pain, palpitations, orthopnea, claudication, leg swelling and PND.  Gastrointestinal: negative for abdominal pain. Negative for heartburn, nausea, vomiting, diarrhea, constipation, blood in stool and melena.  Genitourinary: Negative for dysuria, urgency, frequency, hematuria and flank pain.  Musculoskeletal: Negative for myalgias, back pain, joint pain and falls.  Skin: Negative for itching and rash.  Neurological: Negative for dizziness, tingling, tremors, sensory change, speech change, focal weakness, seizures, loss of consciousness, weakness and headaches.  Endo/Heme/Allergies: Negative for environmental allergies and polydipsia. Does not bruise/bleed easily.  Psychiatric/Behavioral: Negative for depression, suicidal ideas, hallucinations, memory loss and substance abuse. The patient is not nervous/anxious and does not have insomnia.        Objective:  Blood pressure 121/72, pulse 73, height 5' 2.5" (1.588 m), weight 160 lb (72.6 kg).   Physical Exam  Vitals reviewed. Constitutional: She is oriented to person, place, and time. She appears well-developed and well-nourished.  HENT:  Head: Normocephalic and atraumatic.        Right Ear: External ear normal.  Left Ear: External ear normal.  Nose: Nose normal.  Mouth/Throat: Oropharynx is clear and moist.  Eyes:  Conjunctivae and EOM are normal. Pupils are equal, round, and reactive to light. Right eye exhibits no discharge. Left eye exhibits no discharge. No scleral icterus.  Neck: Normal range of motion. Neck supple. No tracheal deviation present. No thyromegaly present.  Cardiovascular: Normal rate, regular rhythm, normal heart sounds and intact distal pulses.  Exam reveals no gallop and no friction rub.   No murmur heard. Respiratory: Effort normal and breath sounds normal. No respiratory distress. She has no wheezes. She has no rales. She exhibits no tenderness.  GI: Soft. Bowel sounds are normal. She exhibits no distension and no mass. There is no tenderness. There is no rebound and no guarding.  Genitourinary:  Breasts no masses skin changes or nipple changes bilaterally      Vulva is normal without lesions Vagina is pink moist without discharge Cervix normal in appearance and pap is done Uterus is normal size shape and contour Adnexa is negative with normal sized ovaries  Rectal heme negative normal tone no masses Musculoskeletal: Normal range of motion. She exhibits no edema and no tenderness.  Neurological: She is alert and oriented to person, place, and time. She has normal reflexes. She displays normal reflexes. No cranial nerve deficit. She exhibits normal muscle tone. Coordination normal.  Skin: Skin is warm and dry. No rash noted. No erythema. No pallor.  Psychiatric: She has a normal mood and affect. Her behavior is normal. Judgment and thought content normal.       Medications  Ordered at today's visit: No orders of the defined types were placed in this encounter.   Other orders placed at today's visit: No orders of the defined types were placed in this encounter.     Assessment:    Normal Gyn exam.    Plan:    Follow up in: 3 years.     Return in about 3 years (around 05/03/2025) for yearly.

## 2022-05-08 LAB — CYTOLOGY - PAP
Comment: NEGATIVE
Diagnosis: NEGATIVE
High risk HPV: NEGATIVE

## 2022-05-12 DIAGNOSIS — D839 Common variable immunodeficiency, unspecified: Secondary | ICD-10-CM | POA: Diagnosis not present

## 2022-06-10 DIAGNOSIS — D839 Common variable immunodeficiency, unspecified: Secondary | ICD-10-CM | POA: Diagnosis not present

## 2022-06-11 DIAGNOSIS — I10 Essential (primary) hypertension: Secondary | ICD-10-CM | POA: Diagnosis not present

## 2022-06-11 DIAGNOSIS — E119 Type 2 diabetes mellitus without complications: Secondary | ICD-10-CM | POA: Diagnosis not present

## 2022-06-14 ENCOUNTER — Ambulatory Visit (INDEPENDENT_AMBULATORY_CARE_PROVIDER_SITE_OTHER): Payer: Medicare HMO

## 2022-06-14 DIAGNOSIS — J4550 Severe persistent asthma, uncomplicated: Secondary | ICD-10-CM | POA: Diagnosis not present

## 2022-06-19 ENCOUNTER — Other Ambulatory Visit: Payer: Self-pay | Admitting: Allergy & Immunology

## 2022-07-07 DIAGNOSIS — D839 Common variable immunodeficiency, unspecified: Secondary | ICD-10-CM | POA: Diagnosis not present

## 2022-07-09 DIAGNOSIS — D839 Common variable immunodeficiency, unspecified: Secondary | ICD-10-CM | POA: Diagnosis not present

## 2022-07-17 DIAGNOSIS — Z79899 Other long term (current) drug therapy: Secondary | ICD-10-CM | POA: Diagnosis not present

## 2022-07-17 DIAGNOSIS — E1159 Type 2 diabetes mellitus with other circulatory complications: Secondary | ICD-10-CM | POA: Diagnosis not present

## 2022-07-23 DIAGNOSIS — I10 Essential (primary) hypertension: Secondary | ICD-10-CM | POA: Diagnosis not present

## 2022-07-23 DIAGNOSIS — R7309 Other abnormal glucose: Secondary | ICD-10-CM | POA: Diagnosis not present

## 2022-07-23 DIAGNOSIS — E1169 Type 2 diabetes mellitus with other specified complication: Secondary | ICD-10-CM | POA: Diagnosis not present

## 2022-08-01 ENCOUNTER — Other Ambulatory Visit: Payer: Self-pay | Admitting: *Deleted

## 2022-08-01 MED ORDER — FASENRA 30 MG/ML ~~LOC~~ SOSY: 30.0000 mg | PREFILLED_SYRINGE | SUBCUTANEOUS | 6 refills | Status: DC

## 2022-08-05 DIAGNOSIS — D839 Common variable immunodeficiency, unspecified: Secondary | ICD-10-CM | POA: Diagnosis not present

## 2022-08-07 DIAGNOSIS — M25552 Pain in left hip: Secondary | ICD-10-CM | POA: Diagnosis not present

## 2022-08-12 ENCOUNTER — Ambulatory Visit (INDEPENDENT_AMBULATORY_CARE_PROVIDER_SITE_OTHER): Payer: Medicare HMO

## 2022-08-12 DIAGNOSIS — J4550 Severe persistent asthma, uncomplicated: Secondary | ICD-10-CM

## 2022-08-12 DIAGNOSIS — M25552 Pain in left hip: Secondary | ICD-10-CM | POA: Diagnosis not present

## 2022-09-01 ENCOUNTER — Other Ambulatory Visit: Payer: Self-pay | Admitting: Allergy & Immunology

## 2022-09-03 DIAGNOSIS — D839 Common variable immunodeficiency, unspecified: Secondary | ICD-10-CM | POA: Diagnosis not present

## 2022-09-11 ENCOUNTER — Encounter: Payer: Self-pay | Admitting: Pulmonary Disease

## 2022-09-11 ENCOUNTER — Ambulatory Visit (INDEPENDENT_AMBULATORY_CARE_PROVIDER_SITE_OTHER): Payer: Medicare HMO | Admitting: Pulmonary Disease

## 2022-09-11 VITALS — BP 128/66 | HR 72 | Ht 62.5 in | Wt 159.0 lb

## 2022-09-11 DIAGNOSIS — J4550 Severe persistent asthma, uncomplicated: Secondary | ICD-10-CM

## 2022-09-11 DIAGNOSIS — J449 Chronic obstructive pulmonary disease, unspecified: Secondary | ICD-10-CM | POA: Diagnosis not present

## 2022-09-11 MED ORDER — NYSTATIN 100000 UNIT/ML MT SUSP: 5.0000 mL | Freq: Two times a day (BID) | OROMUCOSAL | 3 refills | Status: DC

## 2022-09-11 MED ORDER — ALBUTEROL SULFATE 2.5 MG/0.5ML IN NEBU
0.5000 mL | INHALATION_SOLUTION | Freq: Four times a day (QID) | RESPIRATORY_TRACT | 2 refills | Status: DC | PRN
Start: 2022-09-11 — End: 2022-10-11

## 2022-09-11 NOTE — Patient Instructions (Signed)
X refill on nystatin  X Supplies for nebuliser  X Rx for albuterol nebs q6h as needed # 30 x 3 refills Use INSTEAD of duonebs

## 2022-09-11 NOTE — Progress Notes (Signed)
Subjective:    Patient ID: Kathy Tran, female    DOB: 1956/04/01, 67 y.o.   MRN: [id number]  HPI  67 yo  never smoker  for FU of adult onset asthma,  She sees Dr. Dellis Anes, allergy clinic and is maintained on a regimen of Fasenra and IV IgG every 4 weeks for hypogammaglobulinemia.   There is no childhood history of asthma. Symptom onset was in 2018 after an attack of the flu.  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 Repeat IgG testing showed normal levels of 794 09/2018 and 838 and 07/2018   01/2021 dc'd budesonide due to frequent thrush  Chief Complaint  Patient presents with   Follow-up    Doing well. She has thrush- asking for new rx for nystatin.    7 month FU visit Last office visit she had daughter fluid overload and was started on Lasix, she diuresed pounds to her baseline weight of 154 She now takes Lasix on an as-needed basis She continues on IV IgG infusions and feels that she gets a lot of fluid with this. She reports thrush for which she uses nystatin and would like a refill.  She has just started on antibiotic for UTI and this normally causes thrush.  She also has oral Diflucan to use as needed.  She uses Fasenra injections every 8 weeks. Compliant with Breztri.  Has rarely needed albuterol MDI.  She has DuoNebs on  profile     Significant tests/ events reviewed  Spirometry 04/2019  no airway obstruction with FEV1 79% and FVC 78%   CT angiogram chest 09/2016 nodular opacities in the right lung largest 8 x 6 mm subpleural right upper lobe   PFTs 01/2017 moderate airway obstruction with ratio 68, FEV1 1.52/63%, FVC 71%, no bronchodilator response, TLC 90% and DLCO 79%   Serial spirometry 2019 and 2020 shows no airway obstruction with FEV1 ranging from 65% to 113%   01/2017 RAST neg, IgE <2 AEC 09/2018 100  Review of Systems neg for any significant sore throat, dysphagia, itching, sneezing, nasal congestion or excess/ purulent secretions, fever, chills, sweats, unintended wt loss, pleuritic or exertional cp, hempoptysis, orthopnea pnd or change in chronic leg swelling. Also denies presyncope, palpitations, heartburn, abdominal pain, nausea, vomiting, diarrhea or change in bowel or urinary habits, dysuria,hematuria, rash, arthralgias, visual complaints, headache, numbness weakness or ataxia.     Objective:   Physical Exam  Gen. Pleasant, well-nourished, in no distress ENT - no thrush, no pallor/icterus,no post nasal drip Neck: No JVD, no thyromegaly, no carotid bruits Lungs: no use of accessory muscles, no dullness to percussion, clear without rales or rhonchi  Cardiovascular: Rhythm regular, heart sounds  normal, no murmurs or gallops, no peripheral edema Musculoskeletal: No deformities, no cyanosis or clubbing        Assessment & Plan:   Oral thrush -refill will be provided Nystatin

## 2022-09-11 NOTE — Assessment & Plan Note (Signed)
Continue Breztri and [employer]. Will DC DuoNebs to avoid anticholinergic toxicity and switch to albuterol nebs instead to use as needed. She has done well on this regimen and has not had an exacerbation in the past year We discussed action plan for asthma exacerbation

## 2022-09-25 DIAGNOSIS — Z961 Presence of intraocular lens: Secondary | ICD-10-CM | POA: Diagnosis not present

## 2022-09-25 DIAGNOSIS — H52223 Regular astigmatism, bilateral: Secondary | ICD-10-CM | POA: Diagnosis not present

## 2022-09-25 DIAGNOSIS — E119 Type 2 diabetes mellitus without complications: Secondary | ICD-10-CM | POA: Diagnosis not present

## 2022-09-25 DIAGNOSIS — H524 Presbyopia: Secondary | ICD-10-CM | POA: Diagnosis not present

## 2022-09-25 DIAGNOSIS — H35371 Puckering of macula, right eye: Secondary | ICD-10-CM | POA: Diagnosis not present

## 2022-09-25 DIAGNOSIS — H5203 Hypermetropia, bilateral: Secondary | ICD-10-CM | POA: Diagnosis not present

## 2022-09-25 DIAGNOSIS — D839 Common variable immunodeficiency, unspecified: Secondary | ICD-10-CM | POA: Diagnosis not present

## 2022-09-25 DIAGNOSIS — H43813 Vitreous degeneration, bilateral: Secondary | ICD-10-CM | POA: Diagnosis not present

## 2022-09-30 DIAGNOSIS — H524 Presbyopia: Secondary | ICD-10-CM | POA: Diagnosis not present

## 2022-09-30 DIAGNOSIS — H52223 Regular astigmatism, bilateral: Secondary | ICD-10-CM | POA: Diagnosis not present

## 2022-10-01 DIAGNOSIS — D839 Common variable immunodeficiency, unspecified: Secondary | ICD-10-CM | POA: Diagnosis not present

## 2022-10-02 DIAGNOSIS — D839 Common variable immunodeficiency, unspecified: Secondary | ICD-10-CM | POA: Diagnosis not present

## 2022-10-06 IMAGING — MG MM DIGITAL DIAGNOSTIC UNILAT*R* W/ TOMO W/ CAD
6 series · 6 of 18 positions shown · non-contrast
Comparison: Previous exam(s).

CLINICAL DATA: Patient recalled from screening for possible right
breast asymmetry.

EXAM:
DIGITAL DIAGNOSTIC UNILATERAL RIGHT MAMMOGRAM WITH TOMO AND CAD

[R CC synth-2D (1 of 2)]
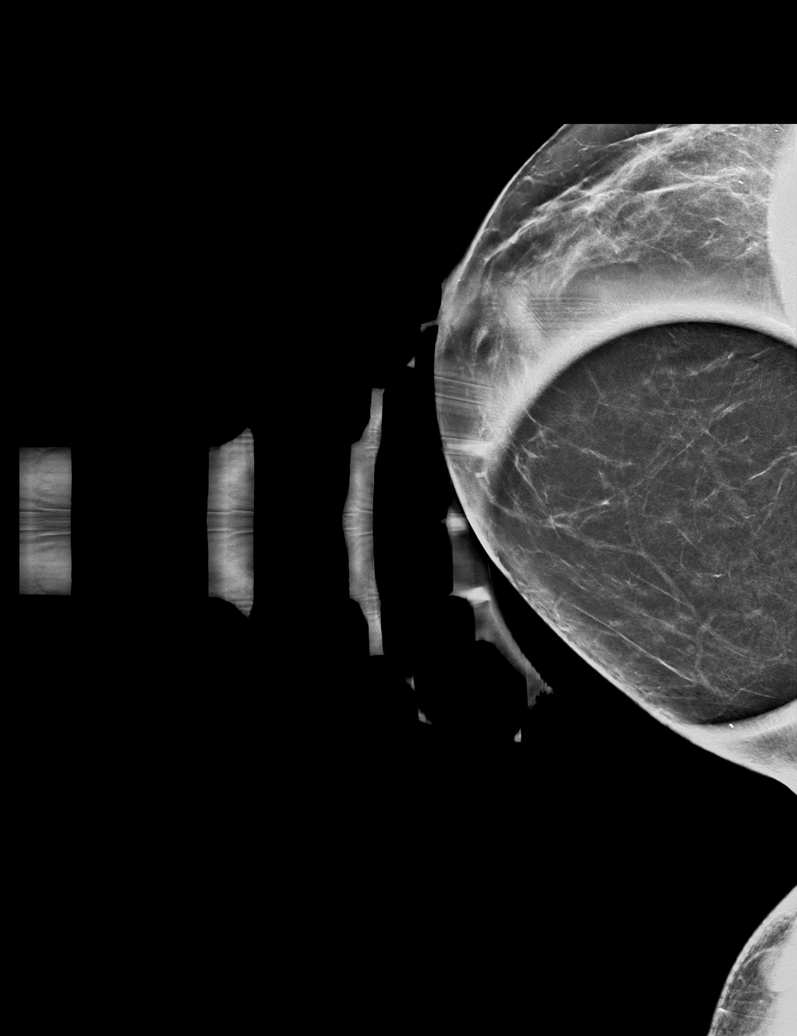

[R CC synth-2D (2 of 2)]
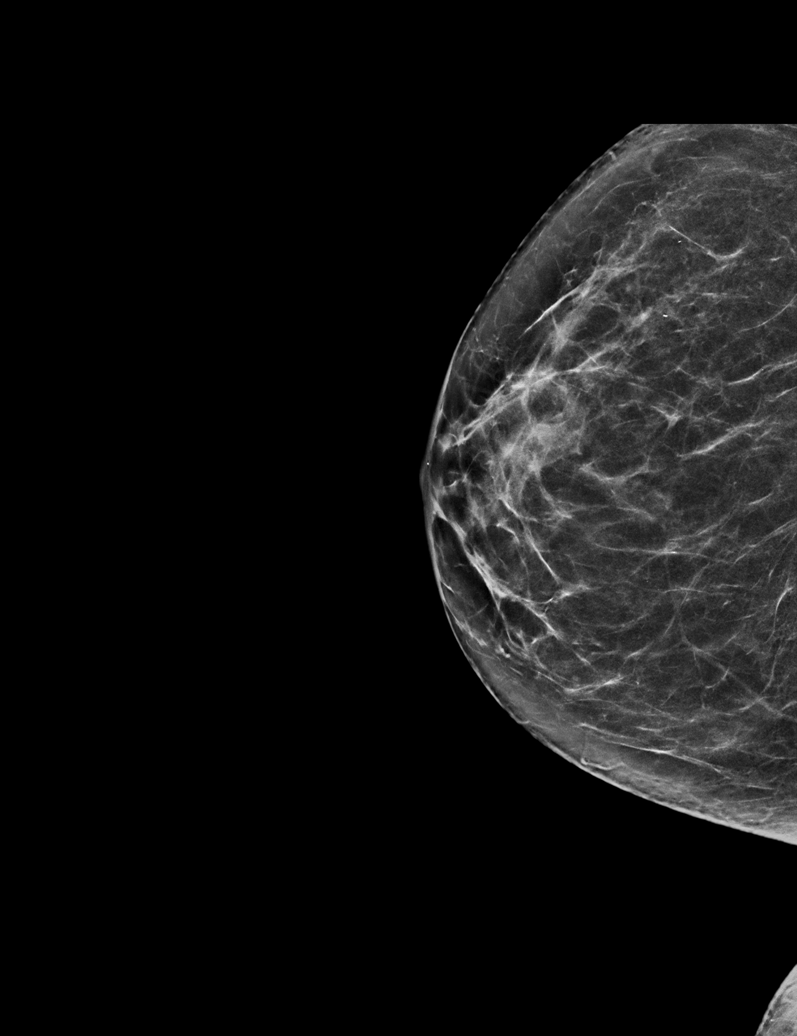

[R ML synth-2D]
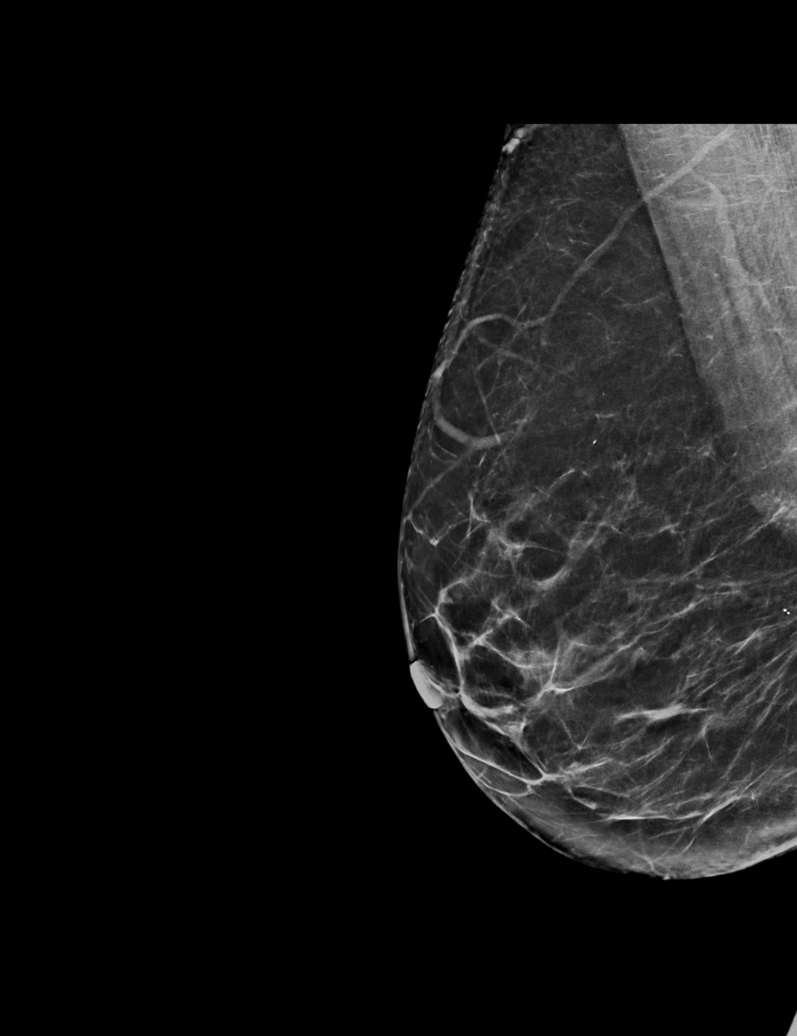

[R CC tomo (1 of 2) · tomo slice 27/54.0]
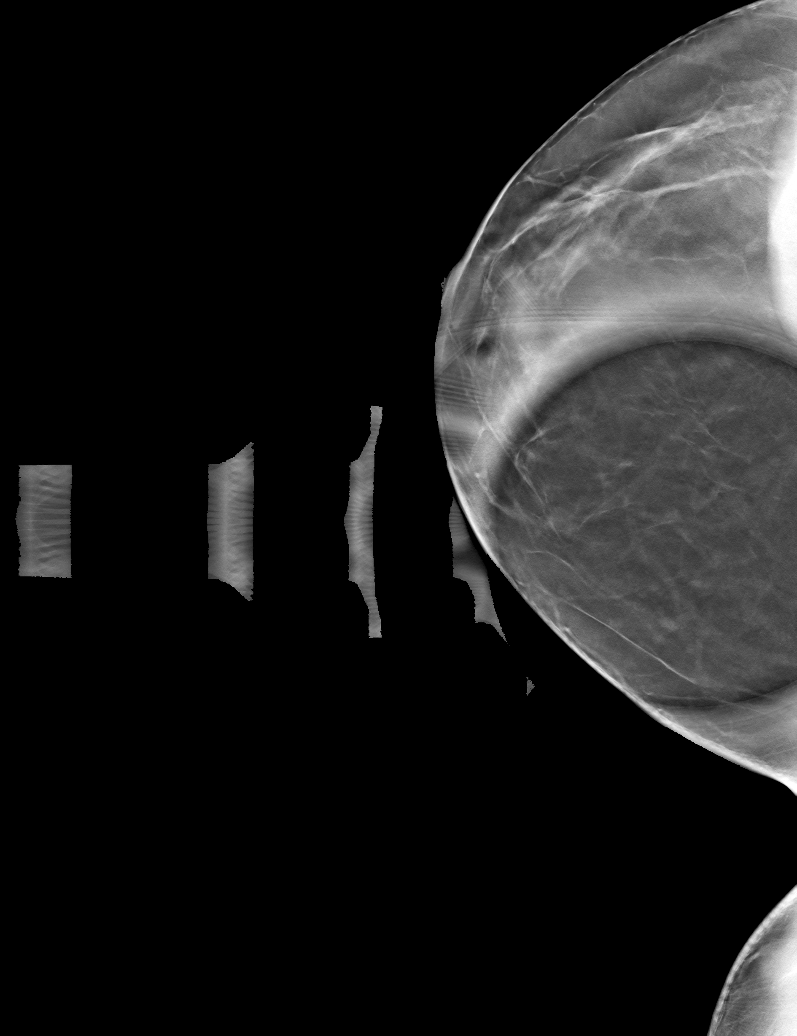

[R ML tomo · tomo slice 33/64.0]
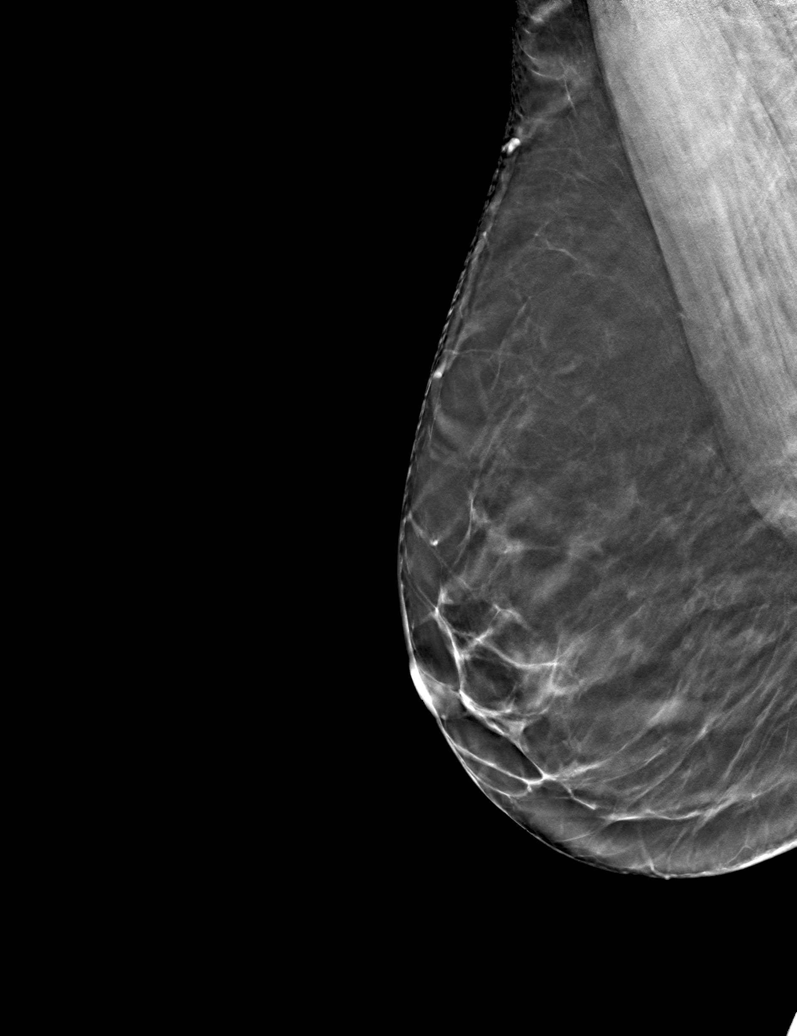

[R CC tomo (2 of 2) · tomo slice 30/59.0]
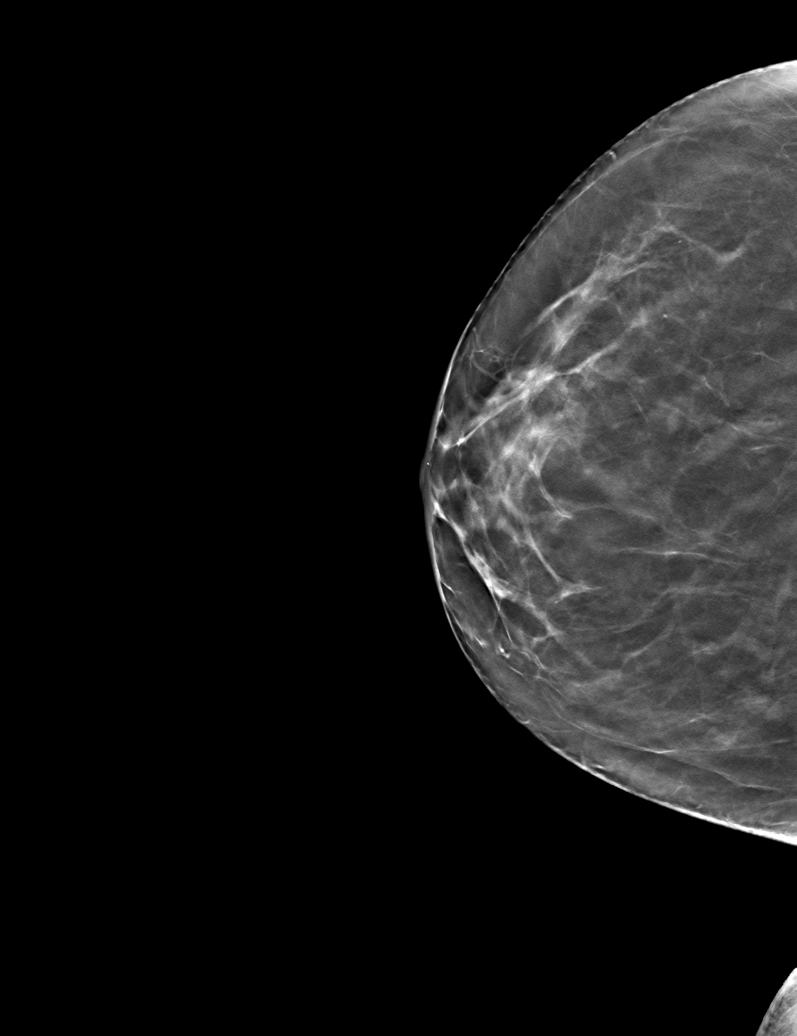

[6 of 18 positions shown; findings below may reference images not displayed]

ACR Breast Density Category c: The breast tissue is heterogeneously
dense, which may obscure small masses.
FINDINGS: Questioned asymmetry within the medial aspect of the right breast on
the cc view resolved with additional imaging compatible with dense
overlapping fibroglandular tissue. No suspicious findings additional
imaging.

Mammographic images were processed with CAD.
IMPRESSION: No mammographic evidence for malignancy.

RECOMMENDATION:
Screening mammogram in one year.(Code:8P-[id number])

I have discussed the findings and recommendations with the patient.
If applicable, a reminder letter will be sent to the patient
regarding the next appointment.

BI-RADS CATEGORY  1: Negative.

## 2022-10-09 ENCOUNTER — Encounter: Payer: Self-pay | Admitting: Allergy & Immunology

## 2022-10-09 ENCOUNTER — Ambulatory Visit: Payer: Medicare HMO | Admitting: Pulmonary Disease

## 2022-10-09 ENCOUNTER — Other Ambulatory Visit: Payer: Self-pay

## 2022-10-09 ENCOUNTER — Ambulatory Visit: Payer: Medicare HMO

## 2022-10-09 ENCOUNTER — Ambulatory Visit (INDEPENDENT_AMBULATORY_CARE_PROVIDER_SITE_OTHER): Payer: Medicare HMO | Admitting: Allergy & Immunology

## 2022-10-09 VITALS — BP 126/68 | HR 81 | Temp 97.8°F | Resp 20 | Ht 63.5 in | Wt 158.1 lb

## 2022-10-09 DIAGNOSIS — D806 Antibody deficiency with near-normal immunoglobulins or with hyperimmunoglobulinemia: Secondary | ICD-10-CM | POA: Diagnosis not present

## 2022-10-09 DIAGNOSIS — R5383 Other fatigue: Secondary | ICD-10-CM | POA: Diagnosis not present

## 2022-10-09 DIAGNOSIS — J310 Chronic rhinitis: Secondary | ICD-10-CM | POA: Diagnosis not present

## 2022-10-09 DIAGNOSIS — J4550 Severe persistent asthma, uncomplicated: Secondary | ICD-10-CM

## 2022-10-09 DIAGNOSIS — L282 Other prurigo: Secondary | ICD-10-CM

## 2022-10-09 DIAGNOSIS — L272 Dermatitis due to ingested food: Secondary | ICD-10-CM

## 2022-10-09 NOTE — Progress Notes (Signed)
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  - Daily controller medication(s): Breztri two puffs twice daily + Fasenra every 8 weeks - Rescue medications: ProAir 4 puffs every 4-6 hours as needed or DuoNeb nebulizer one vial every 4-6 hours as needed  - Changes during respiratory flares: Add on Pulmicort (budesonide) 0.5 mg twice daily via nebulizer for 1-2 weeks - Asthma control goals:  * Full participation in all desired activities (may need albuterol before activity) * Albuterol use two time or less a week on average (not counting use with activity) * Cough interfering with sleep two time or less a month * Oral steroids no more than once a year * No hospitalizations     2. Chronic non-allergic rhinitis - Continue with Zyrtec 10mg  at night.   3. 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  - Continue with all of the premedications, including hydration.   4. Return in about 4 months (around 02/09/2023).    Subjective:   Kathy Tran is a 67 y.o. 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  Kathy Tran is a 67 y.o. female presenting for a follow up visit.  She was last seen in December 2023.  At that time, her lung function looked amazing.  We added on albuterol mixed with budesonide 3 times daily for 1 to 2 weeks because of a coexisting flare.  We continue with her Breztri 2 puffs twice daily as well as her Harrington Challenger every 8 weeks.  For her nonallergic rhinitis, we continue with Benadryl 25 mg at night.  We started her on a prednisone taper due to bilateral otitis media with effusion.  She continue with her IVIG monthly.  Since last visit, she has done fairly well.   Asthma/Respiratory Symptom History: Breathing has been under good control. The humidity has bene fairly out of control. She reports that she has been labored and has been doing her breathing treatment. She remains on the [place] and [place]. She feels that this is doing good.  She has not been having any nighttime symptoms including  coughing or wheezing. She has not been on prednisone at all since her last visit.   Allergic Rhinitis Symptom History: Rhinitis is controlled with the use of cetirizine 10mg  daily. She was previously doing Benadryl for years, but changed over since the last time we saw her. She does not use a nose spray.   Infusions are going well, but she is wondering about spacing them out. She really has been feeling good. She is wondering whether she can space them out to every  6 weeks. She does not want to end them entirely, but she would like to try to go longer than a month between infusions. We had discussed taking a holiday from immunoglobulin infusions in the past and we had discussed that summer was the safest time to attempt this.   She is not having any adverse reactions to her infusion at this point in time.  She has a same premedication regimen. We changed her to the different IVIG formulation because she was developing an upper body rash within a week of her infusions. Unfortunately, she developed the rash with the new formulation as well and wanted to just change back. She has been managing this with more antihistamine and using coverings to made sure that is protected from the sun (she has noticed that sun exposure tends to make it worse overall. However, now this has all gotten better with the switch back to Privigen.   She had been on Asceniv, but she was experiencing a lot of photodermatitis.   She has the beginnings of macular degeneration. They do have treatments for this. She has a chart that she is using to monitor the progression of symptoms. She is not going to try any treatments unless this changes.   Otherwise, there have been no changes to her past medical history, surgical history, family history, or social history.    Review of Systems  Constitutional: Negative.  Negative for chills, fever, malaise/fatigue and weight loss.  HENT: Negative.  Negative for congestion, ear discharge and ear pain.   Eyes:  Negative for pain, discharge and redness.  Respiratory:  Negative for cough, sputum production, shortness of breath and wheezing.   Cardiovascular: Negative.  Negative for chest pain and palpitations.  Gastrointestinal:  Negative for abdominal pain, constipation, diarrhea, heartburn, nausea and vomiting.  Skin: Negative.  Negative for itching and rash.  Neurological:  Negative for dizziness and headaches.  Endo/Heme/Allergies:  Negative for  environmental allergies. Does not bruise/bleed easily.       Objective:   Blood pressure 126/68, pulse 81, temperature 97.8 F (36.6 C), resp. rate 20, height 5' 3.5" (1.613 m), weight 158 lb 2 oz (71.7 kg), SpO2 96 %. Body mass index is 27.57 kg/m.    Physical Exam Vitals reviewed.  Constitutional:      Appearance: She is well-developed.     Comments: Very pleasant and talkative.   HENT:     Head: Normocephalic and atraumatic.     Right Ear: Ear canal and external ear normal. No middle ear effusion.     Left Ear: Ear canal and external ear normal.  No middle ear effusion.     Ears:     Comments: She has surgical changes on her right TM from multiple tube placements. There is fluid behind the bilateral TMs.     Nose: No nasal deformity, septal deviation, mucosal edema or rhinorrhea.     Right Turbinates: Not enlarged, swollen or pale.     Left Turbinates: Not  enlarged, swollen or pale.     Right Sinus: No maxillary sinus tenderness or frontal sinus tenderness.     Left Sinus: No maxillary sinus tenderness or frontal sinus tenderness.     Mouth/Throat:     Mouth: Mucous membranes are not pale and not dry.     Pharynx: Uvula midline.  Eyes:     General: Lids are normal. No allergic shiner.       Right eye: No discharge.        Left eye: No discharge.     Conjunctiva/sclera: Conjunctivae normal.     Right eye: Right conjunctiva is not injected. No chemosis.    Left eye: Left conjunctiva is not injected. No chemosis.    Pupils: Pupils are equal, round, and reactive to light.  Cardiovascular:     Rate and Rhythm: Normal rate and regular rhythm.     Heart sounds: Normal heart sounds.  Pulmonary:     Effort: Pulmonary effort is normal. No tachypnea, accessory muscle usage or respiratory distress.     Breath sounds: Normal breath sounds. No wheezing, rhonchi or rales.     Comments: Air well in all lung fields. Chest:     Chest wall: No tenderness.  Lymphadenopathy:      Cervical: No cervical adenopathy.  Skin:    General: Skin is warm.     Capillary Refill: Capillary refill takes less than 2 seconds.     Coloration: Skin is not pale.     Findings: No abrasion, erythema, petechiae or rash. Rash is not papular, urticarial or vesicular.     Comments: No eczematous or urticarial lesions noted.  Neurological:     Mental Status: She is alert.  Psychiatric:        Behavior: Behavior is cooperative.      Diagnostic studies:    Spirometry: results normal (FEV1: 1.89/83%, FVC: 2.59/89%, FEV1/FVC: 73%).    Spirometry consistent with normal pattern.   Allergy Studies: none        Malachi Bonds, MD  [facility]

## 2022-10-09 NOTE — Patient Instructions (Addendum)
1. 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  - Daily controller medication(s): Breztri two puffs twice daily + Fasenra every 8 weeks - Rescue medications: ProAir 4 puffs every 4-6 hours as needed or DuoNeb nebulizer one vial every 4-6 hours as needed  - Changes during respiratory flares: Add on Pulmicort (budesonide) 0.5 mg twice daily via nebulizer for 1-2 weeks - Asthma control goals:  * Full participation in all desired activities (may need albuterol before activity) * Albuterol use two time or less a week on average (not counting use with activity) * Cough interfering with sleep two time or less a month * Oral steroids no more than once a year * No hospitalizations     2. Chronic non-allergic rhinitis - Continue with Zyrtec 10mg  at night.   3. 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  - Continue with all of the premedications, including hydration.   4. Return in about 4 months (around 02/09/2023).    Please inform us of any Emergency Department visits, hospitalizations, or changes in symptoms. Call us before going to the ED for breathing or allergy symptoms since we might be able to fit you in for a sick visit. Feel free to contact us anytime with any questions, problems, or concerns.  It was a pleasure to see you again today!  Websites that have reliable patient information: 1. American Academy of Asthma, Allergy, and Immunology: www.aaaai.org 2. Food Allergy Research and Education (FARE): foodallergy.org 3. Mothers of Asthmatics: http://www.asthmacommunitynetwork.org 4. American College of Allergy, Asthma, and Immunology: www.acaai.org   COVID-19 Vaccine Information can be found at: PodExchange.nl For questions related to vaccine distribution or appointments, please email [email or web address] or  call [phone].   We realize that you might be concerned about having an allergic reaction to the COVID19 vaccines. To help with that concern, WE ARE OFFERING THE COVID19 VACCINES IN OUR OFFICE! Ask the front desk for dates!     "Like" [place] on Facebook and Instagram for our latest updates!      A healthy democracy works best when [employer] participate! Make sure you are registered to vote! If you have moved or changed any of your contact information, you will need to get this updated before voting!  In some cases, you MAY be able to register to vote online: [email or web address]

## 2022-10-11 ENCOUNTER — Encounter: Payer: Self-pay | Admitting: Allergy & Immunology

## 2022-10-16 DIAGNOSIS — E118 Type 2 diabetes mellitus with unspecified complications: Secondary | ICD-10-CM | POA: Diagnosis not present

## 2022-10-23 DIAGNOSIS — E1169 Type 2 diabetes mellitus with other specified complication: Secondary | ICD-10-CM | POA: Diagnosis not present

## 2022-10-23 DIAGNOSIS — I10 Essential (primary) hypertension: Secondary | ICD-10-CM | POA: Diagnosis not present

## 2022-11-01 ENCOUNTER — Telehealth: Payer: Self-pay | Admitting: Allergy & Immunology

## 2022-11-01 NOTE — Telephone Encounter (Signed)
Started breaking out in hives in yesterday 6/21 and states she still has them today.  They are on her chest, behind her neck, and on her arms.  She is wanting to know if something can be called in for the hives without her having to be on a steroid.  She states she doesn't like being on a steroid but she will do it if she has to but if there is an alternative she would like that.  Kathy Tran uses [facility] in [place].

## 2022-11-01 NOTE — Telephone Encounter (Signed)
I called patient and she said that she is unsure of the cause. Patient did say she started getting hives after she first started the IVIG infusions. She did say that she gets a couple a year and after being exposed to the sun. She did say if she does not treat it that it will spread to the rest of her body. Patient said that they are itchy. She said that she does not have any SOB,cough,wheeze,abd pain or diarrhea.

## 2022-11-01 NOTE — Telephone Encounter (Signed)
Thank you. Can you please have her take cetirizine twice a day and continue montelukast. If she is still experiencing hives, please have her add on famotidine 20 mg twice a day. As below:   Cetirizine (Zyrtec) 10mg  twice a day and famotidine (Pepcid) 20 mg twice a day. If no symptoms for 7-14 days then decrease to... Cetirizine (Zyrtec) 10mg  twice a day and famotidine (Pepcid) 20 mg once a day.  If no symptoms for 7-14 days then decrease to... Cetirizine (Zyrtec) 10mg  twice a day.  If no symptoms for 7-14 days then decrease to... Cetirizine (Zyrtec) 10mg  once a day. Continue montelukast daily May use Benadryl (diphenhydramine) as needed for breakthrough hives       If symptoms return, then step up dosage  Keep a detailed symptom journal including foods eaten, contact with allergens, medications taken, weather changes.  Call the clinic if no improvement or if symptoms worsen. Thank you

## 2022-11-01 NOTE — Telephone Encounter (Signed)
I called patient and informed of note. I informed that is she is not better next week to callback. Mychart message has been sent to patient as it is detailed. I informed patient to check her mychart.

## 2022-11-01 NOTE — Telephone Encounter (Signed)
What are the hives from? How often does she get hives? Any other symptoms like SOB, wheeze, cough, abd pain or diarrhea?

## 2022-11-07 DIAGNOSIS — M79672 Pain in left foot: Secondary | ICD-10-CM | POA: Diagnosis not present

## 2022-11-07 DIAGNOSIS — M7742 Metatarsalgia, left foot: Secondary | ICD-10-CM | POA: Diagnosis not present

## 2022-11-07 DIAGNOSIS — M722 Plantar fascial fibromatosis: Secondary | ICD-10-CM | POA: Diagnosis not present

## 2022-11-07 DIAGNOSIS — M79671 Pain in right foot: Secondary | ICD-10-CM | POA: Diagnosis not present

## 2022-11-15 DIAGNOSIS — D839 Common variable immunodeficiency, unspecified: Secondary | ICD-10-CM | POA: Diagnosis not present

## 2022-11-16 DIAGNOSIS — D839 Common variable immunodeficiency, unspecified: Secondary | ICD-10-CM | POA: Diagnosis not present

## 2022-11-28 ENCOUNTER — Telehealth: Payer: Self-pay | Admitting: *Deleted

## 2022-11-28 NOTE — Telephone Encounter (Signed)
Patient has prescription for albuterol nebulizer and needs sterile water, but cannot find it at any of the pharmacies nearby. Patient would like to see if we can get a prescription for sterile water through [facility] pharmacy.  Please call and advise patient [phone]

## 2022-11-29 NOTE — Telephone Encounter (Signed)
Per patient her rx for albuterol nebs states it should be diluted with sterile water. Advised patient most alb nebs come as single-use non-diluted vials. She states this last rx is different from previous. Per chart albuterol nebs are not on her current med list. Upon review, Dr. Dellis Anes discontinued this medication and changed her treatment plan on 10/09/22: 1. 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  - Daily controller medication(s): Breztri two puffs twice daily + Fasenra every 8 weeks - Rescue medications: ProAir 4 puffs every 4-6 hours as needed or DuoNeb nebulizer one vial every 4-6 hours as needed  - Changes during respiratory flares: Add on Pulmicort (budesonide) 0.5 mg twice daily via nebulizer for 1-2 weeks     Reviewed this with patient. She will follow this treatment plan, and has plenty of DuoNeb and Pulmicort on hand. Nothing further needed at this time.

## 2022-12-02 ENCOUNTER — Ambulatory Visit (INDEPENDENT_AMBULATORY_CARE_PROVIDER_SITE_OTHER): Payer: Medicare HMO

## 2022-12-02 DIAGNOSIS — J4550 Severe persistent asthma, uncomplicated: Secondary | ICD-10-CM

## 2022-12-24 DIAGNOSIS — D839 Common variable immunodeficiency, unspecified: Secondary | ICD-10-CM | POA: Diagnosis not present

## 2022-12-31 DIAGNOSIS — L821 Other seborrheic keratosis: Secondary | ICD-10-CM | POA: Diagnosis not present

## 2022-12-31 DIAGNOSIS — L820 Inflamed seborrheic keratosis: Secondary | ICD-10-CM | POA: Diagnosis not present

## 2023-01-17 DIAGNOSIS — E118 Type 2 diabetes mellitus with unspecified complications: Secondary | ICD-10-CM | POA: Diagnosis not present

## 2023-01-24 DIAGNOSIS — I10 Essential (primary) hypertension: Secondary | ICD-10-CM | POA: Diagnosis not present

## 2023-01-24 DIAGNOSIS — E1169 Type 2 diabetes mellitus with other specified complication: Secondary | ICD-10-CM | POA: Diagnosis not present

## 2023-01-27 ENCOUNTER — Ambulatory Visit (INDEPENDENT_AMBULATORY_CARE_PROVIDER_SITE_OTHER): Payer: Medicare HMO

## 2023-01-27 DIAGNOSIS — J4550 Severe persistent asthma, uncomplicated: Secondary | ICD-10-CM

## 2023-02-12 ENCOUNTER — Other Ambulatory Visit: Payer: Self-pay

## 2023-02-12 ENCOUNTER — Telehealth: Payer: Self-pay

## 2023-02-12 ENCOUNTER — Ambulatory Visit (INDEPENDENT_AMBULATORY_CARE_PROVIDER_SITE_OTHER): Payer: Medicare HMO | Admitting: Allergy & Immunology

## 2023-02-12 ENCOUNTER — Encounter: Payer: Self-pay | Admitting: Allergy & Immunology

## 2023-02-12 VITALS — BP 120/70 | HR 76 | Temp 98.0°F | Ht 63.5 in | Wt 160.0 lb

## 2023-02-12 DIAGNOSIS — D806 Antibody deficiency with near-normal immunoglobulins or with hyperimmunoglobulinemia: Secondary | ICD-10-CM | POA: Diagnosis not present

## 2023-02-12 DIAGNOSIS — J4550 Severe persistent asthma, uncomplicated: Secondary | ICD-10-CM

## 2023-02-12 DIAGNOSIS — J310 Chronic rhinitis: Secondary | ICD-10-CM

## 2023-02-12 DIAGNOSIS — D839 Common variable immunodeficiency, unspecified: Secondary | ICD-10-CM | POA: Diagnosis not present

## 2023-02-12 DIAGNOSIS — K219 Gastro-esophageal reflux disease without esophagitis: Secondary | ICD-10-CM

## 2023-02-12 MED ORDER — BREZTRI AEROSPHERE 160-9-4.8 MCG/ACT IN AERO: INHALATION_SPRAY | RESPIRATORY_TRACT | 11 refills | Status: DC

## 2023-02-12 MED ORDER — BREZTRI AEROSPHERE 160-9-4.8 MCG/ACT IN AERO: INHALATION_SPRAY | RESPIRATORY_TRACT | 5 refills | Status: DC

## 2023-02-12 NOTE — Telephone Encounter (Signed)
Patient came in the office for an ov.Patient filled out the [facility] AZ&ME form. Form has been faxed to AZ&ME at [phone] along with printed script. A copy has been placed to pick up front at the rdv office along with AVS and labs that were ordered. I called patient and informed. A copy of the AZ&ME form has been placed in the red accordion folder until we hear back from AZ&ME.

## 2023-02-12 NOTE — Patient Instructions (Addendum)
1. 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  - We will get you back on AZ and Me to cover your Harrington Challenger and your Breztri.  - Daily controller medication(s): Breztri two puffs twice daily + Fasenra every 8 weeks - Rescue medications: ProAir 4 puffs every 4-6 hours as needed or DuoNeb nebulizer one vial every 4-6 hours as needed  - Changes during respiratory flares: Add on Pulmicort (budesonide) 0.5 mg twice daily via nebulizer for 1-2 weeks - Asthma control goals:  * Full participation in all desired activities (may need albuterol before activity) * Albuterol use two time or less a week on average (not counting use with activity) * Cough interfering with sleep two time or less a month * Oral steroids no more than once a year * No hospitalizations     2. Chronic non-allergic rhinitis - Continue with Zyrtec 10mg  at night.   3. 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  - Continue with all of the premedications, including hydration.   4. Return in about 6 months (around 08/13/2023).    Please inform us of any Emergency Department visits, hospitalizations, or changes in symptoms. Call us before going to the ED for breathing or allergy symptoms since we might be able to fit you in for a sick visit. Feel free to contact us anytime with any questions, problems, or concerns.  It was a pleasure to see you again today!  Websites that have reliable patient information: 1. American Academy of Asthma, Allergy, and Immunology: www.aaaai.org 2. Food Allergy Research and Education (FARE): foodallergy.org 3. Mothers of Asthmatics: http://www.asthmacommunitynetwork.org 4. American College of Allergy, Asthma, and Immunology: www.acaai.org   COVID-19 Vaccine Information can be found at: PodExchange.nl For questions related to  vaccine distribution or appointments, please email [email or web address] or call [phone].   We realize that you might be concerned about having an allergic reaction to the COVID19 vaccines. To help with that concern, WE ARE OFFERING THE COVID19 VACCINES IN OUR OFFICE! Ask the front desk for dates!     "Like" [place] on Facebook and Instagram for our latest updates!      A healthy democracy works best when [employer] participate! Make sure you are registered to vote! If you have moved or changed any of your contact information, you will need to get this updated before voting!  In some cases, you MAY be able to register to vote online: [email or web address]

## 2023-02-12 NOTE — Progress Notes (Signed)
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  - We will get you back on [place] and [place] to cover your Harrington Challenger and your Breztri.  - Daily controller medication(s): Breztri two puffs twice daily + Fasenra every 8 weeks - Rescue medications: ProAir 4 puffs every 4-6 hours as needed or DuoNeb nebulizer one vial every 4-6 hours as needed  - Changes during respiratory flares: Add on Pulmicort (budesonide) 0.5 mg twice daily via nebulizer for 1-2 weeks - Asthma control goals:  * Full participation in all desired activities (may need albuterol before activity) * Albuterol use two time or less a week on average (not counting use with activity) * Cough interfering with sleep two time or less a month * Oral steroids no more than once a year * No hospitalizations     2. Chronic non-allergic rhinitis - Continue with Zyrtec 10mg  at night.   3. 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  - Continue with all of the premedications, including hydration.   4. Return in about 6 months (around 08/13/2023).    Subjective:   Kathy Tran is a 67 y.o. 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  Discussed the use of AI scribe software for clinical note transcription with the patient, who gave verbal consent to proceed.  Kathy Tran is a 67 y.o. female presenting for a follow up visit.  She was last seen in May 2024.  At that time, her lung function looked amazing.  We continue with Breztri 2 puffs twice daily and Fasenra every 8 weeks.  She also has albuterol to use as needed.  She also has Pulmicort added twice daily via nebulizer for 1 to 2 weeks.  For her nonallergic rhinitis, we continue with Zyrtec 10 mg at night.  We continued with her IVIG but spaced out to every 6 weeks.  Since last visit, she has done very well.  Infectious Symptom History: The patient has been receiving regular immunoglobulin infusions for treatment of her specific antibody deficiency. They have been tolerating the treatment well, with no breakthrough infections reported.  The patient expressed a desire to extend the interval between injections from six to eight weeks, despite the upcoming cold and flu season. They acknowledged their susceptibility to infections, which can rapidly escalate to hospitalization.  She does point out that she went 7 weeks between her infusions most recently because she was at the beach last week, so she just had her infusion this morning before coming in today.  Asthma/Respiratory Symptom History: She  remains on Breztri for their respiratory condition, which they reported has been effective in managing their symptoms. However, they have been paying a significant out-of-pocket cost for this medication, approximately $800.  She remains on Fasenra as well which she is getting for free.  She has not been using her rescue inhaler much at all.  She has not been in the hospital for her symptoms.  She has a nebulizer at home to use as needed, but she has not needed it for quite some time.  She avoids triggers as much as she can.  The other day, her husband went to the racetrack and she did not go because she knows that the fumes from the dust triggered her breathing issues.  She does not mind staying at home.  She is not a big fan of the racing anyway.  Allergic Rhinitis Symptom History: Her rhinitis is under good control with Zyrtec at night.  She was using Benadryl at night as well, but this was mostly for sleep.  She does have ipratropium that she uses as needed.  GERD Symptom History: The patient also reported a persistent issue with their throat, which they believe may be due to acid reflux. They denied any associated pain or difficulty swallowing.  She does have Pepcid that she uses daily at nighttime.  She also is on Protonix 40 mg twice a day.  In addition to their respiratory condition, the patient has been managing their diabetes with Jardiance, after discontinuing Trulicity due to adverse effects including weight gain and mood changes. The patient reported a significant improvement in their mood and overall well-being since the switch.  The patient's overall outlook remains positive, despite the challenges of managing multiple chronic conditions. They expressed satisfaction with their current treatment regimen and a willingness to bear the associated costs, given the improvement in their quality of life.      Otherwise, there have been no changes to her past medical history, surgical history, family  history, or social history.    Review of systems otherwise negative other than that mentioned in the HPI.    Objective:   Blood pressure 120/70, pulse 76, temperature 98 F (36.7 C), height 5' 3.5" (1.613 m), weight 160 lb (72.6 kg), SpO2 95%. Body mass index is 27.9 kg/m.    Physical Exam Vitals reviewed.  Constitutional:      Appearance: She is well-developed.     Comments: Very pleasant and talkative.  Very lovely.  HENT:     Head: Normocephalic and atraumatic.     Right Ear: Ear canal and external ear normal.     Left Ear: Ear canal and external ear normal.     Nose: No nasal deformity, septal deviation, mucosal edema or rhinorrhea.     Right Turbinates: Enlarged and swollen. Not pale.     Left Turbinates: Enlarged and swollen. Not pale.     Right Sinus: No maxillary sinus tenderness or frontal sinus tenderness.     Left Sinus: No maxillary sinus tenderness or frontal sinus tenderness.  Mouth/Throat:     Mouth: Mucous membranes are not pale and not dry.     Pharynx: Uvula midline.     Comments: She does have what appears to be lesions on her hard palate.  She says that these been there for a while and they come and go.  She denies any dysphagia as well as fevers.  She has been around her grandchildren, but they have not been sick at all. Eyes:     General: Lids are normal. No allergic shiner.       Right eye: No discharge.        Left eye: No discharge.     Conjunctiva/sclera: Conjunctivae normal.     Right eye: Right conjunctiva is not injected. No chemosis.    Left eye: Left conjunctiva is not injected. No chemosis.    Pupils: Pupils are equal, round, and reactive to light.  Cardiovascular:     Rate and Rhythm: Normal rate and regular rhythm.     Heart sounds: Normal heart sounds.  Pulmonary:     Effort: Pulmonary effort is normal. No tachypnea, accessory muscle usage or respiratory distress.     Breath sounds: Normal breath sounds. No wheezing, rhonchi or  rales.     Comments: Air well in all lung fields. Chest:     Chest wall: No tenderness.  Lymphadenopathy:     Cervical: No cervical adenopathy.  Skin:    General: Skin is warm.     Capillary Refill: Capillary refill takes less than 2 seconds.     Coloration: Skin is not pale.     Findings: No abrasion, erythema, petechiae or rash. Rash is not papular, urticarial or vesicular.     Comments: No eczematous or urticarial lesions noted.  Neurological:     Mental Status: She is alert.  Psychiatric:        Behavior: Behavior is cooperative.      Diagnostic studies:    Spirometry: results normal (FEV1: 1.73/76%, FVC: 2.17/75%, FEV1/FVC: 80%).    Spirometry consistent with normal pattern.   Allergy Studies: none        Malachi Bonds, MD  [facility]

## 2023-02-13 ENCOUNTER — Ambulatory Visit: Payer: Medicare HMO | Admitting: Pulmonary Disease

## 2023-02-14 NOTE — Telephone Encounter (Signed)
[employer] has faxed over that the application has been approved. I called patient and informed. A copy has been placed to bulk scanning and a copy has been placed upfront in the envelope along with other sheets for patient to pick up.

## 2023-02-14 NOTE — Telephone Encounter (Signed)
Patient came to pick up forms °

## 2023-03-11 ENCOUNTER — Encounter: Payer: Self-pay | Admitting: Nurse Practitioner

## 2023-03-11 ENCOUNTER — Telehealth: Payer: Self-pay

## 2023-03-11 ENCOUNTER — Ambulatory Visit (INDEPENDENT_AMBULATORY_CARE_PROVIDER_SITE_OTHER): Payer: Medicare HMO | Admitting: Nurse Practitioner

## 2023-03-11 ENCOUNTER — Ambulatory Visit: Payer: Medicare HMO | Admitting: Pulmonary Disease

## 2023-03-11 VITALS — BP 136/72 | HR 68 | Ht 63.5 in | Wt 158.0 lb

## 2023-03-11 DIAGNOSIS — K222 Esophageal obstruction: Secondary | ICD-10-CM | POA: Diagnosis not present

## 2023-03-11 DIAGNOSIS — K219 Gastro-esophageal reflux disease without esophagitis: Secondary | ICD-10-CM

## 2023-03-11 DIAGNOSIS — J4551 Severe persistent asthma with (acute) exacerbation: Secondary | ICD-10-CM

## 2023-03-11 DIAGNOSIS — D806 Antibody deficiency with near-normal immunoglobulins or with hyperimmunoglobulinemia: Secondary | ICD-10-CM

## 2023-03-11 MED ORDER — DEXLANSOPRAZOLE 60 MG PO CPDR: 60.0000 mg | DELAYED_RELEASE_CAPSULE | Freq: Every day | ORAL | 5 refills | Status: DC

## 2023-03-11 MED ORDER — LANSOPRAZOLE 30 MG PO CPDR: 30.0000 mg | DELAYED_RELEASE_CAPSULE | Freq: Every day | ORAL | 5 refills | Status: DC

## 2023-03-11 NOTE — Assessment & Plan Note (Signed)
Poorly controlled despite pantoprazole twice daily and pepcid at bedtime. Will change her to alternative PPI. Dexilant not covered; lansoprazole rx sent. Discussed proper administration. Side effect profile reviewed. Referral to GI given difficulties controlling symptoms and hx. Red flag symptoms reviewed. GERD precautions.

## 2023-03-11 NOTE — Assessment & Plan Note (Addendum)
Possible mild exacerbation related to allergy season and GERD. She seems to be improved today per her report. Unable to obtain FeNO due to machine being broken. Lung exam clear without bronchospasm. Will hold off on oral steroids. Advised her to utilize ICS nebs for the next week then decrease use as tolerate. If no improvement or symptoms worsen, recommend course of prednisone. Continue aggressive maintenance regimen. Action plan in place.   Patient Instructions  Continue Albuterol inhaler 2 puffs or 3 mL neb every 6 hours as needed for shortness of breath or wheezing. Notify if symptoms persist despite rescue inhaler/neb use.  Continue budesonide nebs 2 mL twice daily. Brush tongue and rinse mouth afterwards. Use consistently over the next week then you can go back to using as needed for shortness of breath or wheezing Continue Breztri 2 puffs Twice daily. Brush tongue and rinse mouth afterwards Continue fasenra injections as scheduled  Continue singulair 1 tab At bedtime Continue zyrtec as prescribed by allergist Continue nasal sprays as prescribed by allergist   Stop pantoprazole. Start lansoprazole 30 mg 30 minutes before your first meal Continue pepcid at bedtime   Referral to GI   If symptoms don't continue to improve or worsen, call me so we can do a course of steroids   Follow up in 4 months with Dr. Sherene Sires in new patient 30 minute slot. If symptoms do not improve or worsen, please contact office for sooner follow up or seek emergency care.

## 2023-03-11 NOTE — Assessment & Plan Note (Signed)
On IVIG therapy. Follow up with Dr. Dellis Anes as scheduled.

## 2023-03-11 NOTE — Progress Notes (Signed)
@Patient  ID: Kathy Tran, female    DOB: 1956/01/12, 67 y.o.   MRN: [id number]  Chief Complaint  Patient presents with   Severe persistent asthma without complication    Referring provider: Carylon Perches, MD  HPI: 67 year old female, former smoker followed for severe asthma on biologic therapy with Harrington Challenger.  She is a patient of Dr. Reginia Naas and last seen in office 09/11/2022.  Past medical history significant for DM, hypertension, GERD, HLD, hypogammaglobulinemia on IVIG. Follows with Dr. Dellis Anes as well.  TEST/EVENTS:  01/29/2017 PFT: FVC 71, FEV1 63, ratio 68, TLC 90, DLCOcor 79. Moderate obstruction with mild diffusing defect  09/25/2020 CXR: coarsened interstitial markings. No acute process 02/12/2023 spirometry: FVC 74, FEV1 75, ratio 80  09/11/2022: OV with Dr. Vassie Loll.  Had fluid overload at last visit.  Was started on Lasix.  Diuresed to baseline weight of 154 pounds.  Now on Lasix on an as-needed basis.  Continues IVIG infusions, which she feels like she gets a lot of fluid with.  Feeling like she has thrush.  Uses nystatin and would like a refill.  Recently started on antibiotic for UTI, which usually causes the thrush.  She is on Fasenra injections every 8 weeks.  Compliant with Breztri.  Discontinue DuoNebs to avoid anticholinergic toxicity and switch to albuterol nebs instead.  Has done well with current regimen has not had his exacerbation in the last year.  03/11/2023: Today - follow up Patient presents today for follow up. She was doing well since she was here last. Saw Dr. Dellis Anes earlier this month. Started coughing a little more with the weather change over the last couple of weeks. It's non productive. She's also felt like her chest has been a little tighter. She does feel like she may be a little more short winded than her usual. She's been using her rescue inhaler which has been helping. She has not heard any wheezing. Sinus symptoms feel about normal for her. Doesn't feel like  her activity tolerance is quite as good as it usually is but relates this to pain in her left hip, which she's seeing ortho for. No fevers, chills, leg swelling, orthopnea. No known sick exposures. She feels better today than she has. She's not used her budesonide nebs. She's taking her Breztri twice daily. No changes to her allergy regimen. She still has a lot of trouble with reflux symptoms, despite using protonix twice a day and pepcid at bedtime. She doesn't recall using anything else. She has seen GI in the past but it has been many years. She was told she had something wrong with her sphincter. Never had any procedures fort his. Has not seen a local GI provider. Denies any weight loss, dysphagia, hoarseness, changes in appetite.   Allergies  Allergen Reactions   Sulfa Antibiotics Anaphylaxis   Hydrocodone Nausea And Vomiting    Immunization History  Administered Date(s) Administered   Influenza,inj,Quad PF,6-35 Mos 11/15/2018   Influenza-Unspecified 01/30/2017, 03/13/2018   Moderna Sars-Covid-2 Vaccination 10/15/2019, 11/12/2019   Pneumococcal Conjugate-13 11/23/2016    Past Medical History:  Diagnosis Date   Angio-edema    Arthritis    Asthma    Flu march and May 2018   COPD (chronic obstructive pulmonary disease) (HCC)    Diabetes mellitus type 2, controlled (HCC)    x5 yrs   Eczema    GERD (gastroesophageal reflux disease)    Heart murmur    History of kidney stones    Hypertension  x 5 yrs   Hypogammaglobulinemia (HCC)    Pneumonia    Recurrent upper respiratory infection (URI)    Renal disorder    renal stones   Urticaria     Tobacco History: Social History   Tobacco Use  Smoking Status Never  Smokeless Tobacco Never   Counseling given: Not Answered   Outpatient Medications Prior to Visit  Medication Sig Dispense Refill   Alcohol Swabs (B-D SINGLE USE SWABS REGULAR) PADS      amLODipine (NORVASC) 5 MG tablet TAKE 1 TABLET BY MOUTH AT BEDTIME      aspirin EC 81 MG tablet Take 1 tablet (81 mg total) by mouth 2 (two) times daily. 60 tablet 0   azelastine (ASTELIN) 0.1 % nasal spray PLACE 2 SPRAYS INTO BOTH NOSTRILS TWICE DAILY. 90 mL 1   Benralizumab (FASENRA) 30 MG/ML SOSY Inject 1 mL (30 mg total) into the skin every 8 (eight) weeks. 1 mL 6   Blood Glucose Calibration (TRUE METRIX LEVEL 1) Low SOLN      Blood Glucose Monitoring Suppl (TRUE METRIX AIR GLUCOSE METER) w/Device KIT      Budeson-Glycopyrrol-Formoterol (BREZTRI AEROSPHERE) 160-9-4.8 MCG/ACT AERO Inhale 2 puffs into the lungs twice daily with spacer. 10.7 g 11   carvedilol (COREG) 25 MG tablet Take 25 mg by mouth 2 (two) times daily with a meal.     Cetirizine HCl (ZYRTEC PO) Take by mouth.     DROPLET PEN NEEDLES 32G X 4 MM MISC      famotidine (PEPCID) 20 MG tablet Take 20 mg by mouth at bedtime.     guaiFENesin (MUCINEX) 600 MG 12 hr tablet Take 1 tablet (600 mg total) by mouth 2 (two) times daily. 60 tablet 1   hydrochlorothiazide (HYDRODIURIL) 25 MG tablet Take 25 mg by mouth daily.     IMMUNE GLOBULIN, HUMAN, IM Inject into the muscle. Every 28 days     insulin aspart (NOVOLOG FLEXPEN) 100 UNIT/ML FlexPen Inject 2 Units into the skin 3 (three) times daily with meals. Per sliding scale     ipratropium (ATROVENT) 0.03 % nasal spray USE 2 SPRAYS IN EACH NOSTRIL THREE TIMES DAILY (Patient taking differently: Place 2 sprays into both nostrils daily as needed for rhinitis.) 90 mL 3   JARDIANCE 25 MG TABS tablet Take 25 mg by mouth daily.     LANTUS SOLOSTAR 100 UNIT/ML Solostar Pen Inject 40 Units into the skin in the morning.     LORazepam (ATIVAN) 0.5 MG tablet Take 0.5 mg by mouth at bedtime.     meloxicam (MOBIC) 15 MG tablet Take 15 mg by mouth in the morning.     metFORMIN (GLUCOPHAGE-XR) 500 MG 24 hr tablet Take 2,000 mg by mouth daily with breakfast.     montelukast (SINGULAIR) 10 MG tablet TAKE 1 TABLET AT BEDTIME 90 tablet 3   nystatin (MYCOSTATIN) 100000 UNIT/ML  suspension Take 5 mLs (500,000 Units total) by mouth in the morning and at bedtime. Swish and swallow 900 mL 3   pravastatin (PRAVACHOL) 10 MG tablet Take 10 mg by mouth at bedtime.      PRESCRIPTION MEDICATION Inject into the vein every 28 (twenty-eight) days. Immunoglobulin Hormone replacement therapy     TRUE METRIX BLOOD GLUCOSE TEST test strip      TRUEplus Lancets 33G MISC      valsartan (DIOVAN) 320 MG tablet Take 320 mg by mouth at bedtime.     VENTOLIN HFA 108 (90 Base) MCG/ACT inhaler  INHALE 2 PUFFS EVERY 4 HOURS AS NEEDED FOR WHEEZING OR SHORTNESS OF BREATH. 18 g 1   pantoprazole (PROTONIX) 40 MG tablet Take 1 tablet (40 mg total) by mouth 2 (two) times daily before a meal. 30 tablet 2   Facility-Administered Medications Prior to Visit  Medication Dose Route Frequency Provider Last Rate Last Admin   Benralizumab SOSY 30 mg  30 mg Subcutaneous Q8 Thomes Dinning, MD   30 mg at 01/27/23 1049     Review of Systems:   Constitutional: No weight loss or gain, night sweats, fevers, chills, fatigue, or lassitude. HEENT: No headaches, difficulty swallowing, tooth/dental problems, or sore throat. No sneezing, itching, ear ache. +baseline nasal congestion, post nasal drip CV:  No chest pain, orthopnea, PND, swelling in lower extremities, anasarca, dizziness, palpitations, syncope Resp: +dry cough; shortness of breath with exertion. No excess mucus or change in color of mucus. No hemoptysis. No wheezing.  No chest wall deformity GI:  +heartburn, indigestion. No abdominal pain, nausea, vomiting, diarrhea, change in bowel habits, loss of appetite, bloody stools.  GU: No dysuria, change in color of urine, urgency or frequency.   Skin: No rash, lesions, ulcerations MSK:  No joint pain or swelling.   Neuro: No dizziness or lightheadedness.  Psych: No depression or anxiety. Mood stable.     Physical Exam:  BP 136/72   Pulse 68   Ht 5' 3.5" (1.613 m)   Wt 158 lb (71.7 kg)    SpO2 95%   BMI 27.55 kg/m   GEN: Pleasant, interactive, well-appearing; in no acute distress HEENT:  Normocephalic and atraumatic. PERRLA. Sclera white. Nasal turbinates pink, moist and patent bilaterally. No rhinorrhea present. Oropharynx pink and moist, without exudate or edema. No lesions, ulcerations, or postnasal drip.  NECK:  Supple w/ fair ROM. No JVD present. Normal carotid impulses w/o bruits. Thyroid symmetrical with no goiter or nodules palpated. No lymphadenopathy.   CV: RRR, no m/r/g, no peripheral edema. Pulses intact, +2 bilaterally. No cyanosis, pallor or clubbing. PULMONARY:  Unlabored, regular breathing. Clear bilaterally A&P w/o wheezes/rales/rhonchi. No accessory muscle use.  GI: BS present and normoactive. Soft, non-tender to palpation. No organomegaly or masses detected.  MSK: No erythema, warmth or tenderness. Cap refil <2 sec all extrem. No deformities or joint swelling noted.  Neuro: A/Ox3. No focal deficits noted.   Skin: Warm, no lesions or rashe Psych: Normal affect and behavior. Judgement and thought content appropriate.     Lab Results:  CBC    Component Value Date/Time   WBC 3.9 10/26/2021 1050   WBC 5.1 07/04/2021 1020   RBC 4.06 10/26/2021 1050   RBC 3.96 07/04/2021 1020   HGB 12.9 10/26/2021 1050   HCT 38.6 10/26/2021 1050   PLT 215 07/04/2021 1020   MCV 95 10/26/2021 1050   MCH 31.8 10/26/2021 1050   MCH 32.3 07/04/2021 1020   MCHC 33.4 10/26/2021 1050   MCHC 33.1 07/04/2021 1020   RDW 11.9 10/26/2021 1050   LYMPHSABS 1.1 10/26/2021 1050   MONOABS 0.2 05/13/2020 1226   EOSABS 0.0 10/26/2021 1050   BASOSABS 0.0 10/26/2021 1050    BMET    Component Value Date/Time   NA 140 03/01/2022 1150   K 4.3 03/01/2022 1150   CL 103 03/01/2022 1150   CO2 24 03/01/2022 1150   GLUCOSE 99 03/01/2022 1150   GLUCOSE 107 (H) 07/04/2021 1020   BUN 18 03/01/2022 1150   CREATININE 0.74 03/01/2022 1150   CALCIUM 9.5  03/01/2022 1150   GFRNONAA >60  07/04/2021 1020   GFRAA 74 10/19/2019 1105    BNP    Component Value Date/Time   BNP 48.1 03/01/2022 1150   BNP 38.0 09/26/2016 0818     Imaging:  No results found.  Benralizumab SOSY 30 mg     Date Action Dose Route User   01/27/2023 1049 Given 30 mg Subcutaneous (Right Arm) Robet Leu A, CMA          Latest Ref Rng & Units 01/29/2017    2:26 PM  PFT Results  FVC-Pre L 2.22   FVC-Predicted Pre % 71   FVC-Post L 2.36   FVC-Predicted Post % 76   Pre FEV1/FVC % % 68   Post FEV1/FCV % % 68   FEV1-Pre L 1.52   FEV1-Predicted Pre % 63   FEV1-Post L 1.60   DLCO uncorrected ml/min/mmHg 17.16   DLCO UNC% % 79   DLCO corrected ml/min/mmHg 17.16   DLCO COR %Predicted % 79   DLVA Predicted % 102   TLC L 4.28   TLC % Predicted % 90   RV % Predicted % 97     No results found for: "NITRICOXIDE"      Assessment & Plan:   Severe persistent asthma with (acute) exacerbation Possible mild exacerbation related to allergy season and GERD. She seems to be improved today per her report. Unable to obtain FeNO due to machine being broken. Lung exam clear without bronchospasm. Will hold off on oral steroids. Advised her to utilize ICS nebs for the next week then decrease use as tolerate. If no improvement or symptoms worsen, recommend course of prednisone. Continue aggressive maintenance regimen. Action plan in place.   Patient Instructions  Continue Albuterol inhaler 2 puffs or 3 mL neb every 6 hours as needed for shortness of breath or wheezing. Notify if symptoms persist despite rescue inhaler/neb use.  Continue budesonide nebs 2 mL twice daily. Brush tongue and rinse mouth afterwards. Use consistently over the next week then you can go back to using as needed for shortness of breath or wheezing Continue Breztri 2 puffs Twice daily. Brush tongue and rinse mouth afterwards Continue fasenra injections as scheduled  Continue singulair 1 tab At bedtime Continue zyrtec as  prescribed by allergist Continue nasal sprays as prescribed by allergist   Stop pantoprazole. Start lansoprazole 30 mg 30 minutes before your first meal Continue pepcid at bedtime   Referral to GI   If symptoms don't continue to improve or worsen, call me so we can do a course of steroids   Follow up in 4 months with Dr. Sherene Sires in new patient 30 minute slot. If symptoms do not improve or worsen, please contact office for sooner follow up or seek emergency care.    GERD (gastroesophageal reflux disease) Poorly controlled despite pantoprazole twice daily and pepcid at bedtime. Will change her to alternative PPI. Dexilant not covered; lansoprazole rx sent. Discussed proper administration. Side effect profile reviewed. Referral to GI given difficulties controlling symptoms and hx. Red flag symptoms reviewed. GERD precautions.   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 Follow up with Dr. Dellis Anes as scheduled.    I spent 35 minutes of dedicated to the care of this patient on the date of this encounter to include pre-visit review of records, face-to-face time with the patient discussing conditions above, post visit ordering of testing, clinical documentation with the electronic health  record, making appropriate referrals as documented, and communicating necessary findings to members of the patients care team.  Noemi Chapel, NP 03/11/2023  Pt aware and understands NP's role.

## 2023-03-11 NOTE — Telephone Encounter (Signed)
Per pharmacy Dexilant not covered by plan, cannot complete PA.  Katie changed to Prevacid.   Patient aware. She will contact [facility] tomorrow to see if this one is covered. Nothing further needed at this time.

## 2023-03-11 NOTE — Patient Instructions (Addendum)
Continue Albuterol inhaler 2 puffs or 3 mL neb every 6 hours as needed for shortness of breath or wheezing. Notify if symptoms persist despite rescue inhaler/neb use.  Continue budesonide nebs 2 mL twice daily. Brush tongue and rinse mouth afterwards. Use consistently over the next week then you can go back to using as needed for shortness of breath or wheezing Continue Breztri 2 puffs Twice daily. Brush tongue and rinse mouth afterwards Continue fasenra injections as scheduled  Continue singulair 1 tab At bedtime Continue zyrtec as prescribed by allergist Continue nasal sprays as prescribed by allergist   Stop pantoprazole. Start lansoprazole 30 mg 30 minutes before your first meal Continue pepcid at bedtime   Referral to GI   If symptoms don't continue to improve or worsen, call me so we can do a course of steroids   Follow up in 4 months with Kathy Tran in new patient 30 minute slot. If symptoms do not improve or worsen, please contact office for sooner follow up or seek emergency care.

## 2023-03-17 DIAGNOSIS — D806 Antibody deficiency with near-normal immunoglobulins or with hyperimmunoglobulinemia: Secondary | ICD-10-CM | POA: Diagnosis not present

## 2023-03-19 LAB — CMP14+EGFR
ALT: 17 [IU]/L (ref 0–32)
AST: 18 [IU]/L (ref 0–40)
Albumin: 4.2 g/dL (ref 3.9–4.9)
Alkaline Phosphatase: 67 [IU]/L (ref 44–121)
BUN/Creatinine Ratio: 18 (ref 12–28)
BUN: 17 mg/dL (ref 8–27)
Bilirubin Total: 0.5 mg/dL (ref 0.0–1.2)
CO2: 20 mmol/L (ref 20–29)
Calcium: 9.3 mg/dL (ref 8.7–10.3)
Chloride: 107 mmol/L — ABNORMAL HIGH (ref 96–106)
Creatinine, Ser: 0.94 mg/dL (ref 0.57–1.00)
Globulin, Total: 2 g/dL (ref 1.5–4.5)
Glucose: 144 mg/dL — ABNORMAL HIGH (ref 70–99)
Potassium: 4.4 mmol/L (ref 3.5–5.2)
Sodium: 142 mmol/L (ref 134–144)
Total Protein: 6.2 g/dL (ref 6.0–8.5)
eGFR: 68 mL/min/{1.73_m2} (ref >59)

## 2023-03-19 LAB — CBC WITH DIFFERENTIAL/PLATELET
Basophils Absolute: 0 10*3/uL (ref 0.0–0.2)
Basos: 1 %
EOS (ABSOLUTE): 0 10*3/uL (ref 0.0–0.4)
Eos: 0 %
Hematocrit: 41.2 % (ref 34.0–46.6)
Hemoglobin: 13.7 g/dL (ref 11.1–15.9)
Immature Grans (Abs): 0 10*3/uL (ref 0.0–0.1)
Immature Granulocytes: 1 %
Lymphocytes Absolute: 1.1 10*3/uL (ref 0.7–3.1)
Lymphs: 27 %
MCH: 31.6 pg (ref 26.6–33.0)
MCHC: 33.3 g/dL (ref 31.5–35.7)
MCV: 95 fL (ref 79–97)
Monocytes Absolute: 0.3 10*3/uL (ref 0.1–0.9)
Monocytes: 7 %
Neutrophils Absolute: 2.5 10*3/uL (ref 1.4–7.0)
Neutrophils: 64 %
Platelets: 185 10*3/uL (ref 150–450)
RBC: 4.34 x10E6/uL (ref 3.77–5.28)
RDW: 12.4 % (ref 11.7–15.4)
WBC: 3.9 10*3/uL (ref 3.4–10.8)

## 2023-03-19 LAB — IGG, IGA, IGM
IgA/Immunoglobulin A, Serum: 84 mg/dL — ABNORMAL LOW (ref 87–352)
IgG (Immunoglobin G), Serum: 781 mg/dL (ref 586–1602)
IgM (Immunoglobulin M), Srm: 53 mg/dL (ref 26–217)

## 2023-03-21 DIAGNOSIS — M25552 Pain in left hip: Secondary | ICD-10-CM | POA: Diagnosis not present

## 2023-03-24 ENCOUNTER — Ambulatory Visit: Payer: Medicare HMO

## 2023-03-24 DIAGNOSIS — J4550 Severe persistent asthma, uncomplicated: Secondary | ICD-10-CM | POA: Diagnosis not present

## 2023-03-28 DIAGNOSIS — M25552 Pain in left hip: Secondary | ICD-10-CM | POA: Diagnosis not present

## 2023-04-04 ENCOUNTER — Other Ambulatory Visit (HOSPITAL_COMMUNITY): Payer: Self-pay | Admitting: Internal Medicine

## 2023-04-04 DIAGNOSIS — Z1231 Encounter for screening mammogram for malignant neoplasm of breast: Secondary | ICD-10-CM

## 2023-04-07 ENCOUNTER — Other Ambulatory Visit: Payer: Self-pay

## 2023-04-07 ENCOUNTER — Encounter: Payer: Self-pay | Admitting: Internal Medicine

## 2023-04-07 ENCOUNTER — Ambulatory Visit (INDEPENDENT_AMBULATORY_CARE_PROVIDER_SITE_OTHER): Payer: Medicare HMO | Admitting: Internal Medicine

## 2023-04-07 VITALS — BP 122/64 | HR 80 | Temp 97.9°F | Ht 62.21 in | Wt 158.4 lb

## 2023-04-07 DIAGNOSIS — E1169 Type 2 diabetes mellitus with other specified complication: Secondary | ICD-10-CM

## 2023-04-07 DIAGNOSIS — D806 Antibody deficiency with near-normal immunoglobulins or with hyperimmunoglobulinemia: Secondary | ICD-10-CM | POA: Diagnosis not present

## 2023-04-07 DIAGNOSIS — J310 Chronic rhinitis: Secondary | ICD-10-CM

## 2023-04-07 DIAGNOSIS — Z794 Long term (current) use of insulin: Secondary | ICD-10-CM | POA: Diagnosis not present

## 2023-04-07 DIAGNOSIS — J4551 Severe persistent asthma with (acute) exacerbation: Secondary | ICD-10-CM | POA: Diagnosis not present

## 2023-04-07 MED ORDER — PREDNISONE 10 MG PO TABS: ORAL_TABLET | ORAL | 0 refills | Status: AC

## 2023-04-07 NOTE — Patient Instructions (Addendum)
1. 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  Keep a close eye on your sugars while on prednisone.  If sugars are high, please use the sliding scale.   - Daily controller medication(s): Breztri two puffs twice daily + Fasenra every 8 weeks - Rescue medications: ProAir 4 puffs every 4-6 hours as needed or DuoNeb nebulizer one vial every 4-6 hours as needed  - Changes during respiratory flares: Add on Pulmicort (budesonide) 0.5 mg twice daily via nebulizer for 1-2 weeks - Asthma control goals:  * Full participation in all desired activities (may need albuterol before activity) * Albuterol use two time or less a week on average (not counting use with activity) * Cough interfering with sleep two time or less a month * Oral steroids no more than once a year * No hospitalizations     2. Chronic rhinitis - Continue with Zyrtec 10mg  at night.   3. 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 Next dose on Friday.  - Continue with all of the premedications, including hydration.   4. Keep follow up on 08/15/2023.

## 2023-04-07 NOTE — Progress Notes (Signed)
FOLLOW UP Date of Service/Encounter:  04/07/23   Subjective:  Kathy Tran (DOB: 1955-09-17) is a 67 y.o. female who [facility] for follow up for an acute visit.   History obtained from: chart review and patient. 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  She is on [place], [place], as needed for Pulmicort nebs, Zyrtec and IVIG every 8 weeks.  Reports since end of October, she has been experiencing frequent episodes of chest tightness, shortness of breath and cough.  On Saturday, she has noticed significant worsening with a dry cough and dyspnea.  Also has a scratchy throat.  No fevers.  No sick contacts.  Not much drainage.  On Breztri 2 puffs twice daily, started Pulmicort nebulizers twice daily and albuterol throughout the day with minimal relief.  Reports it has been a while since she has needed oral prednisone. Her next IVIG dose is due on Friday. Past Medical History: Past Medical History:  Diagnosis Date   Angio-edema    Arthritis    Asthma    Flu march and May 2018   COPD (chronic obstructive pulmonary disease) (HCC)    Diabetes mellitus type 2, controlled (HCC)    x5 yrs   Eczema    GERD (gastroesophageal reflux disease)    Heart murmur    History of kidney stones    Hypertension    x 5 yrs   Hypogammaglobulinemia (HCC)    Pneumonia    Recurrent upper respiratory infection (URI)    Renal disorder    renal stones   Urticaria     Objective:  BP 122/64   Pulse 80   Temp 97.9 F (36.6 C)   Ht 5' 2.21" (1.58 m)   Wt 158 lb 6.4 oz (71.8 kg)   SpO2 96%   BMI 28.78 kg/m  Body mass index is 28.78 kg/m. Physical Exam: GEN: alert, well developed HEENT: clear conjunctiva,  nose with mild inferior turbinate hypertrophy, pink nasal mucosa, slight clear rhinorrhea HEART: regular rate and rhythm, no murmur LUNGS: + end expiratory  wheezing on forced exhalation + coughing, unlabored respiration SKIN: no rashes or lesions  Assessment:   1. Severe persistent asthma with acute exacerbation   2. Chronic rhinitis   3. 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 Type 2 diabetes mellitus with other specified complication, with long-term current use of insulin (HCC)     Plan/Recommendations:  1. 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  Will start oral prednisone.  Start prednisone 40mg  x1 day, 30mg  x2 days, 20mg  x2 days and 10mg  x2 days.  Keep a close eye on your sugars while on prednisone.  If sugars are high, please use the sliding scale.   - Daily controller medication(s): Breztri 160-9-4.16mcg two puffs twice daily + Fasenra 30mg  every 8 weeks - Rescue medications: ProAir 4 puffs every 4-6 hours as needed or DuoNeb nebulizer one vial every 4-6 hours as needed  - Changes during respiratory flares: Add on Pulmicort (budesonide) 0.5 mg twice daily via nebulizer for 1-2 weeks - Asthma control goals:  * Full participation in all desired activities (may need albuterol before activity) * Albuterol use two time or less a week on average (not counting  use with activity) * Cough interfering with sleep two time or less a month * Oral steroids no more than once a year * No hospitalizations     2. Chronic non-allergic rhinitis - Continue with Zyrtec 10mg  at night.   3. 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 Next dose on Friday.  - IgG 03/2023: 781  - Continue with all of the premedications, including hydration.   4. Keep follow up on 08/15/2023.    Alesia Morin, MD [facility]

## 2023-04-09 ENCOUNTER — Telehealth: Payer: Self-pay

## 2023-04-09 ENCOUNTER — Ambulatory Visit (HOSPITAL_COMMUNITY): Payer: Medicare HMO

## 2023-04-09 DIAGNOSIS — K219 Gastro-esophageal reflux disease without esophagitis: Secondary | ICD-10-CM | POA: Diagnosis not present

## 2023-04-09 DIAGNOSIS — E118 Type 2 diabetes mellitus with unspecified complications: Secondary | ICD-10-CM | POA: Diagnosis not present

## 2023-04-09 DIAGNOSIS — I10 Essential (primary) hypertension: Secondary | ICD-10-CM | POA: Diagnosis not present

## 2023-04-09 DIAGNOSIS — D801 Nonfamilial hypogammaglobulinemia: Secondary | ICD-10-CM | POA: Diagnosis not present

## 2023-04-09 DIAGNOSIS — M1990 Unspecified osteoarthritis, unspecified site: Secondary | ICD-10-CM | POA: Diagnosis not present

## 2023-04-09 DIAGNOSIS — Z79899 Other long term (current) drug therapy: Secondary | ICD-10-CM | POA: Diagnosis not present

## 2023-04-09 MED ORDER — AMOXICILLIN-POT CLAVULANATE 875-125 MG PO TABS: 1.0000 | ORAL_TABLET | Freq: Two times a day (BID) | ORAL | 0 refills | Status: AC

## 2023-04-09 NOTE — Telephone Encounter (Signed)
Patient called stating she is worse than she was at her visit Monday. Patient states she is coughing up thick green mucous the size of a dime. Patient is requesting an antibiotic.  [facility] [address]

## 2023-04-09 NOTE — Telephone Encounter (Signed)
Patient has been informed.

## 2023-04-09 NOTE — Telephone Encounter (Signed)
I sent in a course of Augmentin. Please let the patient know.

## 2023-04-11 DIAGNOSIS — D839 Common variable immunodeficiency, unspecified: Secondary | ICD-10-CM | POA: Diagnosis not present

## 2023-04-15 DIAGNOSIS — J4550 Severe persistent asthma, uncomplicated: Secondary | ICD-10-CM | POA: Diagnosis not present

## 2023-04-15 DIAGNOSIS — Z0000 Encounter for general adult medical examination without abnormal findings: Secondary | ICD-10-CM | POA: Diagnosis not present

## 2023-04-15 DIAGNOSIS — I10 Essential (primary) hypertension: Secondary | ICD-10-CM | POA: Diagnosis not present

## 2023-04-15 DIAGNOSIS — Z0001 Encounter for general adult medical examination with abnormal findings: Secondary | ICD-10-CM | POA: Diagnosis not present

## 2023-04-15 DIAGNOSIS — E785 Hyperlipidemia, unspecified: Secondary | ICD-10-CM | POA: Diagnosis not present

## 2023-04-15 DIAGNOSIS — E1169 Type 2 diabetes mellitus with other specified complication: Secondary | ICD-10-CM | POA: Diagnosis not present

## 2023-04-16 DIAGNOSIS — M25552 Pain in left hip: Secondary | ICD-10-CM | POA: Diagnosis not present

## 2023-04-21 DIAGNOSIS — M71552 Other bursitis, not elsewhere classified, left hip: Secondary | ICD-10-CM | POA: Diagnosis not present

## 2023-04-24 DIAGNOSIS — M71552 Other bursitis, not elsewhere classified, left hip: Secondary | ICD-10-CM | POA: Diagnosis not present

## 2023-04-29 DIAGNOSIS — M71552 Other bursitis, not elsewhere classified, left hip: Secondary | ICD-10-CM | POA: Diagnosis not present

## 2023-04-30 DIAGNOSIS — K5730 Diverticulosis of large intestine without perforation or abscess without bleeding: Secondary | ICD-10-CM | POA: Diagnosis not present

## 2023-04-30 DIAGNOSIS — R350 Frequency of micturition: Secondary | ICD-10-CM | POA: Diagnosis not present

## 2023-04-30 DIAGNOSIS — R351 Nocturia: Secondary | ICD-10-CM | POA: Diagnosis not present

## 2023-04-30 DIAGNOSIS — R109 Unspecified abdominal pain: Secondary | ICD-10-CM | POA: Diagnosis not present

## 2023-04-30 DIAGNOSIS — N202 Calculus of kidney with calculus of ureter: Secondary | ICD-10-CM | POA: Diagnosis not present

## 2023-04-30 DIAGNOSIS — N201 Calculus of ureter: Secondary | ICD-10-CM | POA: Diagnosis not present

## 2023-05-12 ENCOUNTER — Ambulatory Visit (HOSPITAL_COMMUNITY): Payer: Medicare HMO

## 2023-05-12 ENCOUNTER — Other Ambulatory Visit: Payer: Self-pay | Admitting: Urology

## 2023-05-12 ENCOUNTER — Encounter (HOSPITAL_COMMUNITY): Payer: Self-pay | Admitting: Urology

## 2023-05-12 ENCOUNTER — Ambulatory Visit (HOSPITAL_COMMUNITY)
Admission: RE | Admit: 2023-05-12 | Discharge: 2023-05-12 | Disposition: A | Discharge status: Home / Self Care | Payer: Medicare HMO | Source: Ambulatory Visit | Attending: Urology | Admitting: Urology

## 2023-05-12 ENCOUNTER — Ambulatory Visit (HOSPITAL_BASED_OUTPATIENT_CLINIC_OR_DEPARTMENT_OTHER): Payer: Medicare HMO

## 2023-05-12 ENCOUNTER — Encounter (HOSPITAL_COMMUNITY)
Admission: RE | Disposition: A | Discharge status: Home / Self Care | Payer: Self-pay | Source: Ambulatory Visit | Attending: Urology

## 2023-05-12 ENCOUNTER — Other Ambulatory Visit: Payer: Self-pay

## 2023-05-12 DIAGNOSIS — I10 Essential (primary) hypertension: Secondary | ICD-10-CM | POA: Diagnosis not present

## 2023-05-12 DIAGNOSIS — N200 Calculus of kidney: Secondary | ICD-10-CM | POA: Diagnosis not present

## 2023-05-12 DIAGNOSIS — J45909 Unspecified asthma, uncomplicated: Secondary | ICD-10-CM | POA: Diagnosis not present

## 2023-05-12 DIAGNOSIS — Z7984 Long term (current) use of oral hypoglycemic drugs: Secondary | ICD-10-CM | POA: Diagnosis not present

## 2023-05-12 DIAGNOSIS — E119 Type 2 diabetes mellitus without complications: Secondary | ICD-10-CM

## 2023-05-12 DIAGNOSIS — J449 Chronic obstructive pulmonary disease, unspecified: Secondary | ICD-10-CM

## 2023-05-12 DIAGNOSIS — Z7982 Long term (current) use of aspirin: Secondary | ICD-10-CM | POA: Insufficient documentation

## 2023-05-12 DIAGNOSIS — D801 Nonfamilial hypogammaglobulinemia: Secondary | ICD-10-CM | POA: Diagnosis not present

## 2023-05-12 DIAGNOSIS — Z794 Long term (current) use of insulin: Secondary | ICD-10-CM | POA: Insufficient documentation

## 2023-05-12 DIAGNOSIS — N132 Hydronephrosis with renal and ureteral calculous obstruction: Secondary | ICD-10-CM | POA: Diagnosis not present

## 2023-05-12 DIAGNOSIS — K219 Gastro-esophageal reflux disease without esophagitis: Secondary | ICD-10-CM | POA: Insufficient documentation

## 2023-05-12 DIAGNOSIS — Z79899 Other long term (current) drug therapy: Secondary | ICD-10-CM | POA: Insufficient documentation

## 2023-05-12 DIAGNOSIS — N201 Calculus of ureter: Secondary | ICD-10-CM

## 2023-05-12 DIAGNOSIS — J4489 Other specified chronic obstructive pulmonary disease: Secondary | ICD-10-CM | POA: Diagnosis not present

## 2023-05-12 DIAGNOSIS — E782 Mixed hyperlipidemia: Secondary | ICD-10-CM | POA: Diagnosis not present

## 2023-05-12 HISTORY — PX: CYSTOSCOPY WITH URETEROSCOPY, STONE BASKETRY AND STENT PLACEMENT: SHX6378

## 2023-05-12 LAB — GLUCOSE, CAPILLARY
Glucose-Capillary: 79 mg/dL (ref 70–99)
Glucose-Capillary: 68 mg/dL — ABNORMAL LOW (ref 70–99)

## 2023-05-12 SURGERY — CYSTOSCOPY, WITH CALCULUS MANIPULATION OR REMOVAL
Anesthesia: General | Site: Ureter | Laterality: Right

## 2023-05-12 MED ORDER — PHENYLEPHRINE 80 MCG/ML (10ML) SYRINGE FOR IV PUSH (FOR BLOOD PRESSURE SUPPORT)
PREFILLED_SYRINGE | INTRAVENOUS | Status: AC
  Filled 2023-05-12: qty 10

## 2023-05-12 MED ORDER — CHLORHEXIDINE GLUCONATE 0.12 % MT SOLN
15.0000 mL | Freq: Once | OROMUCOSAL | Status: AC
  Administered 2023-05-12: 15 mL via OROMUCOSAL

## 2023-05-12 MED ORDER — FENTANYL CITRATE (PF) 100 MCG/2ML IJ SOLN
INTRAMUSCULAR | Status: AC
  Filled 2023-05-12: qty 2

## 2023-05-12 MED ORDER — IOHEXOL 300 MG/ML  SOLN
INTRAMUSCULAR | Status: DC | PRN
  Administered 2023-05-12: 50 mL

## 2023-05-12 MED ORDER — PROPOFOL 10 MG/ML IV BOLUS
INTRAVENOUS | Status: DC | PRN
  Administered 2023-05-12: 120 mg via INTRAVENOUS

## 2023-05-12 MED ORDER — SUGAMMADEX SODIUM 200 MG/2ML IV SOLN
INTRAVENOUS | Status: DC | PRN
  Administered 2023-05-12: 400 mg via INTRAVENOUS

## 2023-05-12 MED ORDER — TRAMADOL HCL 50 MG PO TABS: 50.0000 mg | ORAL_TABLET | Freq: Four times a day (QID) | ORAL | 0 refills | Status: DC | PRN

## 2023-05-12 MED ORDER — OXYCODONE HCL 5 MG PO TABS: 5.0000 mg | ORAL_TABLET | ORAL | Status: DC | PRN

## 2023-05-12 MED ORDER — ACETAMINOPHEN 325 MG PO TABS
650.0000 mg | ORAL_TABLET | ORAL | Status: DC | PRN
Start: 2023-05-12 — End: 2023-05-13

## 2023-05-12 MED ORDER — ACETAMINOPHEN 650 MG RE SUPP
650.0000 mg | RECTAL | Status: DC | PRN
Start: 2023-05-12 — End: 2023-05-13

## 2023-05-12 MED ORDER — MORPHINE SULFATE (PF) 2 MG/ML IV SOLN: 2.0000 mg | INTRAVENOUS | Status: DC | PRN

## 2023-05-12 MED ORDER — CEFAZOLIN SODIUM-DEXTROSE 2-4 GM/100ML-% IV SOLN
2.0000 g | INTRAVENOUS | Status: AC
  Administered 2023-05-12: 2 g via INTRAVENOUS
  Filled 2023-05-12: qty 100

## 2023-05-12 MED ORDER — DROPERIDOL 2.5 MG/ML IJ SOLN: 0.6250 mg | Freq: Once | INTRAMUSCULAR | Status: DC | PRN

## 2023-05-12 MED ORDER — ACETAMINOPHEN 10 MG/ML IV SOLN: 1000.0000 mg | Freq: Once | INTRAVENOUS | Status: DC | PRN

## 2023-05-12 MED ORDER — SODIUM CHLORIDE 0.9 % IR SOLN
Status: DC | PRN
  Administered 2023-05-12: 3000 mL

## 2023-05-12 MED ORDER — OXYCODONE HCL 5 MG PO TABS: 5.0000 mg | ORAL_TABLET | Freq: Once | ORAL | Status: DC | PRN

## 2023-05-12 MED ORDER — LIDOCAINE HCL (CARDIAC) PF 100 MG/5ML IV SOSY
PREFILLED_SYRINGE | INTRAVENOUS | Status: DC | PRN
  Administered 2023-05-12: 60 mg via INTRAVENOUS

## 2023-05-12 MED ORDER — PHENYLEPHRINE HCL (PRESSORS) 10 MG/ML IV SOLN
INTRAVENOUS | Status: DC | PRN
  Administered 2023-05-12 (×3): 80 ug via INTRAVENOUS
  Administered 2023-05-12: 160 ug via INTRAVENOUS

## 2023-05-12 MED ORDER — SODIUM CHLORIDE 0.9% FLUSH: 3.0000 mL | INTRAVENOUS | Status: DC | PRN

## 2023-05-12 MED ORDER — OXYCODONE HCL 5 MG/5ML PO SOLN: 5.0000 mg | Freq: Once | ORAL | Status: DC | PRN

## 2023-05-12 MED ORDER — ONDANSETRON HCL 4 MG/2ML IJ SOLN
INTRAMUSCULAR | Status: AC
  Filled 2023-05-12: qty 2

## 2023-05-12 MED ORDER — FENTANYL CITRATE (PF) 100 MCG/2ML IJ SOLN
INTRAMUSCULAR | Status: DC | PRN
  Administered 2023-05-12 (×2): 50 ug via INTRAVENOUS

## 2023-05-12 MED ORDER — SODIUM CHLORIDE 0.9% FLUSH: 3.0000 mL | Freq: Two times a day (BID) | INTRAVENOUS | Status: DC

## 2023-05-12 MED ORDER — ROCURONIUM BROMIDE 100 MG/10ML IV SOLN
INTRAVENOUS | Status: DC | PRN
  Administered 2023-05-12: 50 mg via INTRAVENOUS

## 2023-05-12 MED ORDER — PROPOFOL 10 MG/ML IV BOLUS
INTRAVENOUS | Status: AC
  Filled 2023-05-12: qty 20

## 2023-05-12 MED ORDER — INSULIN ASPART 100 UNIT/ML IJ SOLN: 0.0000 [IU] | INTRAMUSCULAR | Status: DC | PRN

## 2023-05-12 MED ORDER — SODIUM CHLORIDE 0.9 % IV SOLN: 250.0000 mL | INTRAVENOUS | Status: DC | PRN

## 2023-05-12 MED ORDER — ONDANSETRON HCL 4 MG/2ML IJ SOLN
INTRAMUSCULAR | Status: DC | PRN
  Administered 2023-05-12: 4 mg via INTRAVENOUS

## 2023-05-12 MED ORDER — DEXAMETHASONE SODIUM PHOSPHATE 10 MG/ML IJ SOLN
INTRAMUSCULAR | Status: AC
  Filled 2023-05-12: qty 1

## 2023-05-12 MED ORDER — LACTATED RINGERS IV SOLN: INTRAVENOUS | Status: DC

## 2023-05-12 MED ORDER — ROCURONIUM BROMIDE 10 MG/ML (PF) SYRINGE
PREFILLED_SYRINGE | INTRAVENOUS | Status: AC
  Filled 2023-05-12: qty 10

## 2023-05-12 MED ORDER — DEXAMETHASONE SODIUM PHOSPHATE 10 MG/ML IJ SOLN
INTRAMUSCULAR | Status: DC | PRN
  Administered 2023-05-12: 8 mg via INTRAVENOUS

## 2023-05-12 MED ORDER — LIDOCAINE HCL (PF) 2 % IJ SOLN
INTRAMUSCULAR | Status: AC
  Filled 2023-05-12: qty 5

## 2023-05-12 MED ORDER — FENTANYL CITRATE PF 50 MCG/ML IJ SOSY: 25.0000 ug | PREFILLED_SYRINGE | INTRAMUSCULAR | Status: DC | PRN

## 2023-05-12 SURGICAL SUPPLY — 22 items
BAG URO CATCHER STRL LF (MISCELLANEOUS) ×1 IMPLANT
BASKET STONE NCOMPASS (UROLOGICAL SUPPLIES) IMPLANT
CATH URETERAL DUAL LUMEN 10F (MISCELLANEOUS) IMPLANT
CATH URETL OPEN 5X70 (CATHETERS) IMPLANT
CLOTH BEACON ORANGE TIMEOUT ST (SAFETY) ×1 IMPLANT
EXTRACTOR STONE NITINOL NGAGE (UROLOGICAL SUPPLIES) IMPLANT
GLOVE SURG SS PI 8.0 STRL IVOR (GLOVE) ×1 IMPLANT
GOWN SPEC L4 XLG W/TWL (GOWN DISPOSABLE) ×1 IMPLANT
GUIDEWIRE STR DUAL SENSOR (WIRE) ×1 IMPLANT
IV NS IRRIG 3000ML ARTHROMATIC (IV SOLUTION) ×1 IMPLANT
KIT TURNOVER KIT A (KITS) IMPLANT
LASER FIB FLEXIVA PULSE ID 365 (Laser) IMPLANT
LASER FIB FLEXIVA PULSE ID 550 (Laser) IMPLANT
LASER FIB FLEXIVA PULSE ID 910 (Laser) IMPLANT
MANIFOLD NEPTUNE II (INSTRUMENTS) ×1 IMPLANT
PACK CYSTO (CUSTOM PROCEDURE TRAY) ×1 IMPLANT
SHEATH NAVIGATOR HD 11/13X36 (SHEATH) IMPLANT
SHEATH NAVIGATOR HD 12/14X28 (SHEATH) IMPLANT
STENT URET 6FRX22 CONTOUR (STENTS) IMPLANT
TRACTIP FLEXIVA PULS ID 200XHI (Laser) IMPLANT
TUBING CONNECTING 10 (TUBING) ×1 IMPLANT
TUBING UROLOGY SET (TUBING) ×1 IMPLANT

## 2023-05-12 NOTE — Transfer of Care (Signed)
Immediate Anesthesia Transfer of Care Note  Patient: Kathy Tran  Procedure(s) Performed: Procedure(s): CYSTOSCOPY WITH URETEROSCOPY, STONE BASKETRY AND STENT PLACEMENT,HOLMIUM LASER (Right)  Patient Location: PACU  Anesthesia Type:General  Level of Consciousness:  sedated, patient cooperative and responds to stimulation  Airway & Oxygen Therapy:Patient Spontanous Breathing and Patient connected to face mask oxgen  Post-op Assessment:  Report given to PACU RN and Post -op Vital signs reviewed and stable  Post vital signs:  Reviewed and stable  Last Vitals:  Vitals:   05/12/23 1451  BP: (!) 122/93  Pulse: 70  Resp: 16  Temp: 36.6 C  SpO2: 96%    Complications: No apparent anesthesia complications

## 2023-05-12 NOTE — Anesthesia Procedure Notes (Signed)
Procedure Name: Intubation Date/Time: 05/12/2023 6:17 PM  Performed by: Maurene Capes, CRNAPre-anesthesia Checklist: Patient identified, Emergency Drugs available, Suction available and Patient being monitored Patient Re-evaluated:Patient Re-evaluated prior to induction Oxygen Delivery Method: Circle System Utilized Preoxygenation: Pre-oxygenation with 100% oxygen Induction Type: IV induction Ventilation: Mask ventilation without difficulty Laryngoscope Size: Mac and 4 Grade View: Grade II Tube type: Oral Number of attempts: 1 Placement Confirmation: ETT inserted through vocal cords under direct vision, positive ETCO2 and breath sounds checked- equal and bilateral Secured at: 21 cm Tube secured with: Tape Dental Injury: Teeth and Oropharynx as per pre-operative assessment

## 2023-05-12 NOTE — H&P (Signed)
Consulted to assess the patient's right lower quadrant discomfort. She was also having a little bit of back pain on the right side. She was having some nausea but no vomiting or fever. She has had pain off and on for about 10 days. She says she has had multiple procedures and ureteroscopy for stones.   She is voiding every hour during the day which is a little bit more frequent than usual. She is getting up every hour at night when she normally gets up once.   She takes Jardiance for her diabetes and daily aspirin. No blood thinners. Has not had a hysterectomy   Does not normally get bladder infections   05/12/23: Kathy Tran returns today in f/u. She had right flank pain on intial presentation and now she has primarily nausea with voiding complaints with urgency. She had a 3mm stone on CT on 12/18. She has been on tamsulosin.     ALLERGIES: Demerol - confusion Hydrocodone - Vomiting Sulfa - Anaphylaxis    MEDICATIONS: Aspirin  Flomax 0.4 mg capsule 1 capsule PO Daily  Metformin Hcl  Zyrtec  Amlodipine Besylate  Azelastine Hcl  Breztri Aerosphere 160 mcg-9 mcg-4.8 mcg/actuation hfa aerosol with adapter  Carvedilol 25 mg tablet  Famotidine  Fasnera Injection  Immunoglobulin Replacement Therapy  Ipratropium-Albuterol 0.5 mg-3 mg (2.5 mg base)/3 ml ampul for nebulization  Jardiance 25 mg tablet  Lantus  Lorazepam 0.5 mg tablet  Meloxicam 15 mg tablet  Mucinex  Novolog  Ondansetron Hcl 4 mg tablet 1 tablet PO Q 8 H PRN  Pantoprazole Sodium  Singulair  Tramadol Hcl 50 mg tablet 1 tablet PO Q 8 H PRN  Valsartan 320 mg tablet  Ventolin Hfa 90 mcg hfa aerosol with adapter     GU PSH: Cystoscopy Insert Stent - 2010 Ureteroscopic stone removal, Left - 2017, 2017, 2010       PSH Notes: Cystoscopy With Ureteroscopy With Removal Of Calculus, Cystoscopy With Ureteroscopy With Removal Of Calculus, Cystoscopy With Insertion Of Ureteral Stent Right, Gynecologic Surgery, Cesarean Section, Tubal  Ligation   NON-GU PSH: Cataract surgery, Bilateral Cesarean Delivery Only - 2010 Genital Surgery Procedure - 2010 Hip Arthroscopy/surgery, Right Right hand, thumb, Right Shoulder Surgery (Unspecified), Right Tonsillectomy Tubal Ligation - 2010     GU PMH: Nocturia - 04/30/2023 Renal and ureteral calculus - 04/30/2023, - 2017 Urinary Frequency - 04/30/2023 History of urolithiasis, She is doing well without evidence of stones or infection. Fu in 1year. - 2019 Personal Hx Urinary Tract Infections - 2019 Ureteral calculus (Worsening), I don't see an obvious stone today.. - 2017, Calculus of ureter, - 2017 Renal calculus, Nephrolithiasis - 2017 Renal colic, Renal colic - 2017 Dysuria, Dysuria - 2017 Urinary Urgency, Urinary urgency - 2017 Acute Cystitis/UTI, Acute cystitis without hematuria - 2017 Urinary Tract Inf, Unspec site, Urinary tract infection - 2016 Abdominal Pain Unspec, Rt flank pain - 2016 Low back pain, Lower back pain - 2014    NON-GU PMH: Encounter for general adult medical examination without abnormal findings, Encounter for preventive health examination - 2015, Normal routine physical examination, - 2014 Personal history of other diseases of the circulatory system, History of hypertension - 2014 Personal history of other endocrine, nutritional and metabolic disease, History of diabetes mellitus - 2014 Personal history of other specified conditions, History of heartburn - 2014 Arthritis Asthma COPD Diabetes Type 2 GERD Hypertension    FAMILY HISTORY: 2 sons - Son Arthritis - Grandmother copd - Aunt, Mother Deceased - Mother, Father Hypertension -  Runs In Family nephrolithiasis - Grandfather Prostate Cancer - Father Urinary Calculus - Runs In Family   SOCIAL HISTORY: Marital Status: Married Preferred Language: English; Ethnicity: Not Hispanic Or Latino; Race: White Current Smoking Status: Patient has never smoked.  Does not use smokeless tobacco. Does  not drink anymore.  Drinks 1 caffeinated drink per day. 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     Notes: Former smoker   REVIEW OF SYSTEMS:    GU Review Female:   Patient reports frequent urination, get up at night to urinate, and leakage of urine. Patient denies hard to postpone urination, burning /pain with urination, stream starts and stops, trouble starting your stream, have to strain to urinate, and being pregnant.  Gastrointestinal (Upper):   Patient reports nausea. Patient denies vomiting and indigestion/ heartburn.  Gastrointestinal (Lower):   Patient denies diarrhea and constipation.  Constitutional:   Patient denies fever, night sweats, weight loss, and fatigue.  Skin:   Patient denies skin rash/ lesion and itching.  Eyes:   Patient denies blurred vision and double vision.  Ears/ Nose/ Throat:   Patient denies sore throat and sinus problems.  Hematologic/Lymphatic:   Patient denies swollen glands and easy bruising.  Cardiovascular:   Patient denies leg swelling and chest pains.  Respiratory:   Patient denies cough and shortness of breath.  Endocrine:   Patient denies excessive thirst.  Musculoskeletal:   Patient denies back pain and joint pain.  Neurological:   Patient reports headaches. Patient denies dizziness.  Psychologic:   Patient denies depression and anxiety.   Notes: Bladder spasm, urgency    VITAL SIGNS:      05/12/2023 09:45 AM  Weight 154 lb / 69.85 kg  Height 62 in / 157.48 cm  BP 127/73 mmHg  Heart Rate 69 /min  Temperature 97.3 F / 36.2 C  BMI 28.2 kg/m   MULTI-SYSTEM PHYSICAL EXAMINATION:    Constitutional: Well-nourished. No physical deformities. Normally developed. Good grooming.  Respiratory: Normal breath sounds. No labored breathing, no use of accessory muscles.   Cardiovascular: Regular rate and rhythm. No murmur, no gallop.      Complexity of Data:  Records Review:   Previous Patient Records  Urine Test Review:   Urinalysis   X-Ray Review: KUB: Reviewed Films. Discussed With Patient.  C.T. Stone Protocol: Reviewed Films. Discussed With Patient.     PROCEDURES:         KUB - [id number]  A single view of the abdomen is obtained. There is a possible stone in the right pelvis with surrounding phleboliths. She has some stable spinal arthritis and no gas or soft tissue abnormalities.       Patient confirmed No Neulasta OnPro Device.           Visit Complexity - G2211 Chronic management         Urinalysis Dipstick Dipstick Cont'd  Color: Yellow Bilirubin: Neg mg/dL  Appearance: Clear Ketones: Neg mg/dL  Specific Gravity: 6.063 Blood: Neg ery/uL  pH: 5.5 Protein: Neg mg/dL  Glucose: 2+ mg/dL Urobilinogen: 0.2 mg/dL    Nitrites: Neg    Leukocyte Esterase: Neg leu/uL    ASSESSMENT:      ICD-10 Details  1 GU:   Ureteral calculus - N20.1 Right, Acute, Systemic Symptoms - She has persistent voiding symptom and nausea with a right distal stone. I discussed ESWL and URS and will get her set up for URS today.   I have reviewed the risks of ureteroscopy including bleeding,  infection, ureteral injury, need for a stent or secondary procedures, thrombotic events and anesthetic complications.     PLAN:           Orders X-Rays: KUB          Schedule Return Visit/Planned Activity: ASAP - Schedule Surgery  Procedure: Unspecified Date - Cysto Uretero Lithotripsy - 14782, right Notes: today          Document Letter(s):  Created for Patient: Clinical Summary         Notes:   CC: Dr. Carylon Perches.         Next Appointment:      Next Appointment: 05/26/2023 01:30 PM    Appointment Type: Postoperative Appointment    Location: [facility] [phone]    Provider: Anastasia Fiedler, P.A.    Reason for Visit: PO--URS

## 2023-05-12 NOTE — Anesthesia Preprocedure Evaluation (Signed)
Anesthesia Evaluation  Patient identified by MRN, date of birth, ID band Patient awake    Reviewed: Allergy & Precautions, NPO status , Patient's Chart, lab work & pertinent test results  Airway Mallampati: III  TM Distance: <3 FB Neck ROM: Full    Dental no notable dental hx.    Pulmonary asthma , COPD,  COPD inhaler   Pulmonary exam normal breath sounds clear to auscultation       Cardiovascular hypertension, Pt. on medications Normal cardiovascular exam Rhythm:Regular Rate:Normal     Neuro/Psych negative neurological ROS  negative psych ROS   GI/Hepatic Neg liver ROS,GERD  ,,  Endo/Other  diabetes, Type 2, Insulin Dependent    Renal/GU Renal diseaseKindey stones  negative genitourinary   Musculoskeletal negative musculoskeletal ROS (+)    Abdominal   Peds negative pediatric ROS (+)  Hematology negative hematology ROS (+) hypogammaglobulinemia   Anesthesia Other Findings   Reproductive/Obstetrics negative OB ROS                              Anesthesia Physical Anesthesia Plan  ASA: 3  Anesthesia Plan: General   Post-op Pain Management: Tylenol PO (pre-op)*   Induction: Intravenous  PONV Risk Score and Plan: 2 and Ondansetron and Dexamethasone  Airway Management Planned: Oral ETT  Additional Equipment:   Intra-op Plan:   Post-operative Plan: Extubation in OR  Informed Consent: I have reviewed the patients History and Physical, chart, labs and discussed the procedure including the risks, benefits and alternatives for the proposed anesthesia with the patient or authorized representative who has indicated his/her understanding and acceptance.     Dental advisory given  Plan Discussed with: CRNA and Surgeon  Anesthesia Plan Comments:          Anesthesia Quick Evaluation

## 2023-05-12 NOTE — Op Note (Signed)
Procedure: 1.  Cystoscopy with right retrograde pyelogram and interpretation. 2.  Right ureteroscopy with holmium laser application, stone manipulation and stent insertion. 3.  Application of fluoroscopy.  Preop diagnosis: Right distal ureteral stone.  Postop diagnosis: Same.  Surgeon: Dr. Bjorn Pippin.  Anesthesia: General.  Specimen: Stone fragments given to the family.  Drain: 6 [place] by 22 cm right contour double-J stent with tether.  EBL: None.  Complications: None.  Indications: The patient is a 67 year old female with history recurrent urolithiasis with multiple prior procedures and stones who presented the office today with pain from a 5 mm right distal ureteral stone that had been seen on CT scan earlier this month.  It appeared unchanged in position on KUB today.  She elected to proceed with ureteroscopy this evening for management.  Procedure: She was taken operating room where she was given an antibiotic.  A general anesthetic was induced.  She was placed in lithotomy position and fitted with PAS hose.  Her perineum and genitalia were prepped with Betadine solution she was draped in usual sterile fashion.  Cystoscopy was performed using the 21 [place] scope and 30 degree lens  Examination really normal urethra.  The bladder wall with mild trabeculation without tumors, stones or inflammation.  Ureteral orifices were unremarkable.  The right ureteral orifice was cannulated with a 5 [place] open-ended catheter and Omnipaque was instilled.  Right retrograde pyelogram revealed a normal caliber distal ureter with a filling defect in the upper mid ureter consistent with her known stone.  There was minimal hydronephrosis proximally.  After completion of the retrograde pyelogram a sensor wire was advanced to the kidney under fluoroscopic guidance.  There was some resistance at the level of the stone but when the wire was stiffened with the open-ended catheter it did pass into the upper  ureter and then to the kidney without difficulty.  The cystoscope was removed and a 28 cm 11 French ureteral access sheath inner core was then advanced over the wire and easily passed to the level of the stone.  The single-lumen semirigid ureteroscope was then passed alongside the wire until the stone was visualized.  A 365 m holmium laser fiber was then used with the holmium laser on the dusting setting to fragment the stone using 0.3 J and 60 Hz.  Once the stone was adequately fragmented, the fragments were removed using an engage basket.  Once all the significant fragments had been removed the ureteroscope was removed and the cystoscope was replaced.  The stone fragments were evacuated from the bladder and then the cystoscope was placed over the wire and a 6 [place] by 22 cm contour double-J stent with tether was passed to the kidney under fluoroscopic guidance without difficulty.  The wire was removed, a good coil in the kidney and a good coil in the bladder.  The bladder was drained and the cystoscope was removed leaving the stent string exiting urethra.  The string was tied close to the meatus, trimmed to an appropriate length and then tucked vaginally.  She was taken down from lithotomy position, her anesthetic was reversed and she was moved to recovery in stable condition.  There were no complications.  The stone fragments were given to the family.

## 2023-05-12 NOTE — Discharge Instructions (Addendum)
You may remove the stent on Thursday morning by pulling the attached string that is tuck vaginally.  If you don't feel you can do that, please call the office for removal.

## 2023-05-13 ENCOUNTER — Encounter (HOSPITAL_COMMUNITY): Payer: Self-pay | Admitting: Urology

## 2023-05-13 NOTE — Anesthesia Postprocedure Evaluation (Signed)
Anesthesia Post Note  Patient: Kathy Tran  Procedure(s) Performed: CYSTOSCOPY WITH URETEROSCOPY, STONE BASKETRY AND STENT PLACEMENT,HOLMIUM LASER (Right: Ureter)     Patient location during evaluation: PACU Anesthesia Type: General Level of consciousness: awake and alert Pain management: pain level controlled Vital Signs Assessment: post-procedure vital signs reviewed and stable Respiratory status: spontaneous breathing, nonlabored ventilation, respiratory function stable and patient connected to nasal cannula oxygen Cardiovascular status: blood pressure returned to baseline and stable Postop Assessment: no apparent nausea or vomiting Anesthetic complications: no   No notable events documented.  Last Vitals:  Vitals:   05/12/23 1945 05/12/23 2000  BP: 135/68 129/65  Pulse: 70 76  Resp: 11 12  Temp:  (!) 36.3 C  SpO2: 91% 90%    Last Pain:  Vitals:   05/12/23 2000  TempSrc:   PainSc: 3                  [place]

## 2023-05-15 ENCOUNTER — Other Ambulatory Visit: Payer: Self-pay | Admitting: *Deleted

## 2023-05-15 MED ORDER — FASENRA 30 MG/ML ~~LOC~~ SOSY: 30.0000 mg | PREFILLED_SYRINGE | SUBCUTANEOUS | 6 refills | Status: DC

## 2023-05-19 ENCOUNTER — Ambulatory Visit: Payer: Medicare HMO

## 2023-05-22 ENCOUNTER — Encounter: Payer: Self-pay | Admitting: Allergy & Immunology

## 2023-05-22 ENCOUNTER — Ambulatory Visit (INDEPENDENT_AMBULATORY_CARE_PROVIDER_SITE_OTHER): Payer: Medicare HMO | Admitting: Allergy & Immunology

## 2023-05-22 ENCOUNTER — Other Ambulatory Visit: Payer: Self-pay

## 2023-05-22 VITALS — BP 124/70 | HR 89 | Temp 97.8°F | Resp 12

## 2023-05-22 DIAGNOSIS — J4551 Severe persistent asthma with (acute) exacerbation: Secondary | ICD-10-CM | POA: Diagnosis not present

## 2023-05-22 DIAGNOSIS — K219 Gastro-esophageal reflux disease without esophagitis: Secondary | ICD-10-CM | POA: Diagnosis not present

## 2023-05-22 DIAGNOSIS — J0100 Acute maxillary sinusitis, unspecified: Secondary | ICD-10-CM | POA: Diagnosis not present

## 2023-05-22 DIAGNOSIS — D806 Antibody deficiency with near-normal immunoglobulins or with hyperimmunoglobulinemia: Secondary | ICD-10-CM | POA: Diagnosis not present

## 2023-05-22 DIAGNOSIS — E1169 Type 2 diabetes mellitus with other specified complication: Secondary | ICD-10-CM

## 2023-05-22 DIAGNOSIS — Z794 Long term (current) use of insulin: Secondary | ICD-10-CM

## 2023-05-22 DIAGNOSIS — J310 Chronic rhinitis: Secondary | ICD-10-CM

## 2023-05-22 MED ORDER — FLUCONAZOLE 150 MG PO TABS: 150.0000 mg | ORAL_TABLET | Freq: Once | ORAL | 0 refills | Status: AC

## 2023-05-22 MED ORDER — BUDESONIDE 0.5 MG/2ML IN SUSP
RESPIRATORY_TRACT | 3 refills | Status: AC
Start: 2023-05-22 — End: ?

## 2023-05-22 MED ORDER — METHYLPREDNISOLONE ACETATE 80 MG/ML IJ SUSP
80.0000 mg | Freq: Once | INTRAMUSCULAR | Status: AC
  Administered 2023-05-22: 80 mg via INTRAMUSCULAR

## 2023-05-22 MED ORDER — DOXYCYCLINE HYCLATE 100 MG PO TABS: 100.0000 mg | ORAL_TABLET | Freq: Two times a day (BID) | ORAL | 0 refills | Status: AC

## 2023-05-22 MED ORDER — ALBUTEROL SULFATE (2.5 MG/3ML) 0.083% IN NEBU: 2.5000 mg | INHALATION_SOLUTION | Freq: Four times a day (QID) | RESPIRATORY_TRACT | 3 refills | Status: DC | PRN

## 2023-05-22 NOTE — Patient Instructions (Addendum)
 1. 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 - DepoMedrol 80mg  given today in clinic.  - Refills sent in for your nebulizer solutions.  - Daily controller medication(s): Breztri  two puffs twice daily + Fasenra  every 8 weeks - Rescue medications: ProAir  4 puffs every 4-6 hours as needed or DuoNeb nebulizer one vial every 4-6 hours as needed  - Changes during respiratory flares: Add on Pulmicort  (budesonide ) 0.5 mg twice daily via nebulizer for 1-2 weeks - Asthma control goals:  * Full participation in all desired activities (may need albuterol  before activity) * Albuterol  use two time or less a week on average (not counting use with activity) * Cough interfering with sleep two time or less a month * Oral steroids no more than once a year * No hospitalizations     2. Chronic non-allergic rhinitis - with overlying sinusitis - Continue with Zyrtec 10mg  at night.  - Start doxycycline  100mg  twice daily for two weeks. - Complete the entire course.   3. 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  - Continue with all of the premedications, including hydration.   4. Follow up as scheduled in April 2025.   Please inform us  of any Emergency Department visits, hospitalizations, or changes in symptoms. Call us  before going to the ED for breathing or allergy symptoms since we might be able to fit you in for a sick visit. Feel free to contact us  anytime with any questions, problems, or concerns.  It was a pleasure to see you again today!  Websites that have reliable patient information: 1. American Academy of Asthma, Allergy, and Immunology: www.aaaai.org 2. Food Allergy Research and Education (FARE): foodallergy.org 3. Mothers of Asthmatics: http://www.asthmacommunitynetwork.org 4. American College of Allergy, Asthma, and Immunology: www.acaai.org   COVID-19 Vaccine Information can be  found at: podexchange.nl For questions related to vaccine distribution or appointments, please email vaccine@[email or web address] or call [phone].   We realize that you might be concerned about having an allergic reaction to the COVID19 vaccines. To help with that concern, WE ARE OFFERING THE COVID19 VACCINES IN OUR OFFICE! Ask the front desk for dates!     "Like" us  on Facebook and Instagram for our latest updates!      A healthy democracy works best when [employer] participate! Make sure you are registered to vote! If you have moved or changed any of your contact information, you will need to get this updated before voting!  In some cases, you MAY be able to register to vote online: [email or web address]

## 2023-05-22 NOTE — Progress Notes (Signed)
 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  We did give her a course of prednisone  to add on in case things worsen over the weekend.  We might need to consider increasing the frequency of her immunoglobulin infusions.  We might have just gone too far with spacing it out every 8 weeks.  Will see how she is at the next visit in April.  Thankfully, COVID testing was negative.  Plan/Recommendations:   1. 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 - DepoMedrol 80mg  given today in clinic.  - Refills sent in for your nebulizer solutions.  - Daily controller medication(s): Breztri  two puffs twice daily + Fasenra  every 8 weeks - Rescue medications: ProAir  4 puffs every 4-6 hours as needed or DuoNeb nebulizer one vial every 4-6 hours as needed  - Changes during respiratory flares: Add on Pulmicort  (budesonide ) 0.5 mg twice daily via nebulizer for 1-2 weeks - Asthma control goals:  * Full participation in all desired activities (may need albuterol  before activity) * Albuterol  use two time or less a week on average (not counting use with activity) * Cough interfering with sleep two time or less a month * Oral steroids no more than once a year * No hospitalizations     2. Chronic non-allergic rhinitis - with overlying sinusitis - Continue with Zyrtec 10mg  at night.  - Start doxycycline  100mg  twice daily for two weeks. - Complete the entire course.   3. 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  - Continue with all of the premedications, including hydration.   4. Follow up as scheduled in April 2025.  Subjective:   Kathy Tran is a 68 y.o. 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  Discussed the use of AI scribe software for clinical note transcription with the patient and/or guardian, who gave verbal consent to proceed.  Kathy Tran is a 68 y.o. female presenting for a sick visit.  She was last seen in November 2024.  At that time, she was experiencing  an acute exacerbation of her asthma.  She was started on a prednisone  taper and continued on Breztri  2 puffs twice daily as well as Fasenra  every 8 weeks.  She has Pulmicort  that she mixes with albuterol  to use 2-3 times daily as needed.  She remains on her immunoglobulin replacement for her hypogammaglobulinemia.  For her nonallergic rhinitis, she continued with cetirizine at night.  Since the last visit, she has mostly done well.  She was doing very well  until she went to [facility] to undergo surgical intervention for the kidney stones. The procedure involved laser lithotripsy and stent placement. Postoperatively, the patient developed symptoms suggestive of an upper respiratory tract infection, which she suspects was contracted during the hospital stay.   Kathy Tran denies fever but reports a sensation of being in a barrel and experiencing a ringing in her ears.  She reports that both ears feel very full and painful at times.  She reports a significant increase in coughing and production of green sputum since yesterday.  She has not had a fever.  She has been using Mucinex  and over-the-counter treatments with minimal improvement.  She has not had any antibiotics in well over 6 months, likely 9 months.  This is fairly good for her.  She remains on the Fasenra , but is 1 week behind.  The increment weather caused her appointment to be canceled.  She is getting these injections every 8 weeks and they have overall done fairly well to keep her off of prednisone .  She did start her albuterol  and budesonide  nebulizer treatments around 1 week ago, which she takes in addition to her Breztri ..  Otherwise, there have been no changes to her past medical history, surgical history, family history, or social history.    Review of systems otherwise negative other than that mentioned in the HPI.    Objective:   Blood pressure 124/70, pulse 89, temperature 97.8 F (36.6 C), temperature source Temporal, resp. rate 12, SpO2 97%. There is no height or weight on file to calculate BMI.    Physical Exam Vitals reviewed.  Constitutional:      Appearance: She is well-developed. She is ill-appearing. She is not toxic-appearing or diaphoretic.     Comments: Not her normal bubbly self.  Hoarse.  HENT:     Head: Normocephalic and atraumatic.     Right Ear: Ear canal and external ear normal.     Left Ear: Ear canal and external ear normal.     Nose: No nasal  deformity, septal deviation, mucosal edema or rhinorrhea.     Right Turbinates: Enlarged, swollen and pale.     Left Turbinates: Enlarged, swollen and pale.     Right Sinus: No maxillary sinus tenderness or frontal sinus tenderness.     Left Sinus: No maxillary sinus tenderness or frontal sinus tenderness.     Comments: No nasal polyps.    Mouth/Throat:     Mouth: Mucous membranes are not pale and not dry.     Pharynx: Uvula midline.     Comments: She does have what appears to be lesions on her hard palate.  She says that these been there for a while and they come and go.  She denies any dysphagia as well as fevers.  She has been around her grandchildren, but they have not been sick at all. Eyes:     General: Lids are normal. No allergic shiner.       Right eye: No discharge.  Left eye: No discharge.     Conjunctiva/sclera: Conjunctivae normal.     Right eye: Right conjunctiva is not injected. No chemosis.    Left eye: Left conjunctiva is not injected. No chemosis.    Pupils: Pupils are equal, round, and reactive to light.  Cardiovascular:     Rate and Rhythm: Normal rate and regular rhythm.     Heart sounds: Normal heart sounds.  Pulmonary:     Effort: Pulmonary effort is normal. No tachypnea, accessory muscle usage or respiratory distress.     Breath sounds: Normal breath sounds. No wheezing, rhonchi or rales.     Comments: Expiratory wheezing in the bilateral lung fields, especially at the bases. Chest:     Chest wall: No tenderness.  Lymphadenopathy:     Cervical: No cervical adenopathy.  Skin:    General: Skin is warm.     Capillary Refill: Capillary refill takes less than 2 seconds.     Coloration: Skin is not pale.     Findings: No abrasion, erythema, petechiae or rash. Rash is not papular, urticarial or vesicular.     Comments: No eczematous or urticarial lesions noted.  Neurological:     Mental Status: She is alert.  Psychiatric:        Behavior: Behavior is  cooperative.      Diagnostic studies: none   COVID swab: Negative with excellent positive control  Depo-Medrol  80 mg given in the clinic      Kathy Shaggy, Kathy Tran  [facility] 

## 2023-05-23 ENCOUNTER — Telehealth: Payer: Self-pay

## 2023-05-23 NOTE — Telephone Encounter (Signed)
-----   Message from Kathy Tran sent at 05/23/2023  7:58 AM EST ----- Can we call and see how she is feeling?

## 2023-05-23 NOTE — Telephone Encounter (Signed)
 Called patient - DOB verified - stated she is feeling better - hopefully, even better in the next few days.   Forwarding updated message to provider.

## 2023-05-26 DIAGNOSIS — N201 Calculus of ureter: Secondary | ICD-10-CM | POA: Diagnosis not present

## 2023-05-26 DIAGNOSIS — N202 Calculus of kidney with calculus of ureter: Secondary | ICD-10-CM | POA: Diagnosis not present

## 2023-05-28 ENCOUNTER — Ambulatory Visit: Payer: Medicare HMO

## 2023-05-28 DIAGNOSIS — J4551 Severe persistent asthma with (acute) exacerbation: Secondary | ICD-10-CM

## 2023-05-31 DIAGNOSIS — D839 Common variable immunodeficiency, unspecified: Secondary | ICD-10-CM | POA: Diagnosis not present

## 2023-06-02 ENCOUNTER — Telehealth: Payer: Self-pay | Admitting: Internal Medicine

## 2023-06-02 NOTE — Telephone Encounter (Signed)
Spoke with patient regarding new appointment date and time with Dr. Sherene Sires 07/14/23 at 2:15 pm---Dr. Sherene Sires not in the office the afternoon onf 07/10/23--will mail information to patient and she voiced her understanding

## 2023-06-07 DIAGNOSIS — D839 Common variable immunodeficiency, unspecified: Secondary | ICD-10-CM | POA: Diagnosis not present

## 2023-06-09 ENCOUNTER — Encounter (HOSPITAL_COMMUNITY): Payer: Self-pay

## 2023-06-09 ENCOUNTER — Ambulatory Visit (HOSPITAL_COMMUNITY)
Admission: RE | Admit: 2023-06-09 | Discharge: 2023-06-09 | Disposition: A | Discharge status: Home / Self Care | Payer: Medicare HMO | Source: Ambulatory Visit | Attending: Internal Medicine | Admitting: Internal Medicine

## 2023-06-09 DIAGNOSIS — Z1231 Encounter for screening mammogram for malignant neoplasm of breast: Secondary | ICD-10-CM | POA: Insufficient documentation

## 2023-06-09 DIAGNOSIS — Z23 Encounter for immunization: Secondary | ICD-10-CM | POA: Diagnosis not present

## 2023-06-13 ENCOUNTER — Telehealth: Payer: Self-pay

## 2023-06-13 NOTE — Telephone Encounter (Signed)
Received a fax from [facility] on an update on the patient's shipment. I called and informed a copy has been placed to be mailed out to the address on file.

## 2023-06-14 ENCOUNTER — Other Ambulatory Visit: Payer: Self-pay | Admitting: Allergy & Immunology

## 2023-06-24 DIAGNOSIS — N201 Calculus of ureter: Secondary | ICD-10-CM | POA: Diagnosis not present

## 2023-07-08 DIAGNOSIS — E118 Type 2 diabetes mellitus with unspecified complications: Secondary | ICD-10-CM | POA: Diagnosis not present

## 2023-07-10 ENCOUNTER — Ambulatory Visit: Payer: Medicare HMO | Admitting: Internal Medicine

## 2023-07-13 NOTE — Progress Notes (Deleted)
 Kathy Tran, female    DOB: 1955/08/11    MRN: [id number]   Brief patient profile:  68  yo*** *** former [place] pt self-referred back to pulmonary clinic in [place]  07/14/2023  for asthma   Symptom onset was in 2018 after an attack of the flu.  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 Repeat IgG testing showed normal levels of 794 09/2018 and 838 and 07/2018   01/2021 dc'd budesonide due to frequent thrush   History of Present Illness  07/14/2023  Pulmonary/ 1st office eval/ Towanda Hornstein / [place] [facility]  No chief complaint on file.    Dyspnea:  *** Cough: *** Sleep: *** SABA use: *** 02 use:*** LDSCT:***  No obvious day to day or daytime pattern/variability or assoc excess/ purulent sputum or mucus plugs or hemoptysis or cp or chest tightness, subjective wheeze or overt sinus or hb symptoms.    Also denies any obvious fluctuation of symptoms with weather or environmental changes or other aggravating or alleviating factors except as outlined above   No unusual exposure hx or h/o childhood pna/ asthma or knowledge of premature birth.  Current Allergies, Complete Past Medical History, Past Surgical History, Family History, and Social History were reviewed in [employer] record.  ROS  The following are not active complaints unless  bolded Hoarseness, sore throat, dysphagia, dental problems, itching, sneezing,  nasal congestion or discharge of excess mucus or purulent secretions, ear ache,   fever, chills, sweats, unintended wt loss or wt gain, classically pleuritic or exertional cp,  orthopnea pnd or arm/hand swelling  or leg swelling, presyncope, palpitations, abdominal pain, anorexia, nausea, vomiting, diarrhea  or change in bowel habits or change in bladder habits, change in stools or change in urine, dysuria, hematuria,  rash, arthralgias, visual complaints, headache, numbness, weakness or ataxia or problems with walking or coordination,  change in mood or  memory.            Outpatient Medications Prior to Visit  Medication Sig Dispense Refill   albuterol (PROVENTIL) (2.5 MG/3ML) 0.083% nebulizer solution Take 3 mLs (2.5 mg total) by nebulization every 6 (six) hours as needed for wheezing or shortness of breath. 75 mL 3   Alcohol Swabs (B-D SINGLE USE SWABS REGULAR) PADS      amLODipine (NORVASC) 5 MG tablet TAKE 1 TABLET BY MOUTH AT BEDTIME     aspirin EC 81 MG tablet Take 1 tablet (81 mg total) by mouth 2 (two) times daily. 60 tablet 0   azelastine (ASTELIN) 0.1 % nasal spray PLACE 2 SPRAYS INTO BOTH NOSTRILS TWICE DAILY. 90 mL 1   benralizumab (FASENRA) 30 MG/ML prefilled syringe Inject 1 mL (30 mg total) into the skin every 8 (eight) weeks. 1 mL 6   Blood Glucose Calibration (TRUE METRIX LEVEL 1) Low SOLN      Blood Glucose Monitoring Suppl (  TRUE METRIX AIR GLUCOSE METER) w/Device KIT      Budeson-Glycopyrrol-Formoterol (BREZTRI AEROSPHERE) 160-9-4.8 MCG/ACT AERO Inhale 2 puffs into the lungs twice daily with spacer. 10.7 g 11   budesonide (PULMICORT) 0.5 MG/2ML nebulizer solution Mix one vial with albuterol nebulizer solution and use every 4 hours as needed for respiratory flares. 120 mL 3   carvedilol (COREG) 25 MG tablet Take 25 mg by mouth 2 (two) times daily with a meal.     Cetirizine HCl (ZYRTEC PO) Take by  mouth.     DROPLET PEN NEEDLES 32G X 4 MM MISC      famotidine (PEPCID) 20 MG tablet Take 20 mg by mouth at bedtime.     guaiFENesin (MUCINEX) 600 MG 12 hr tablet Take 1 tablet (600 mg total) by mouth 2 (two) times daily. 60 tablet 1   hydrochlorothiazide (HYDRODIURIL) 25 MG tablet Take 25 mg by mouth daily.     IMMUNE GLOBULIN, HUMAN, IM Inject into the muscle. Every 28 days     insulin aspart (NOVOLOG FLEXPEN) 100 UNIT/ML FlexPen Inject 2 Units into the skin 3 (three) times daily with meals. Per sliding scale     ipratropium (ATROVENT) 0.03 % nasal spray USE 2 SPRAYS IN EACH NOSTRIL THREE TIMES DAILY (Patient taking differently: Place 2 sprays into both nostrils daily as needed for rhinitis.) 90 mL 3   JARDIANCE 25 MG TABS tablet Take 25 mg by mouth daily.     lansoprazole (PREVACID) 30 MG capsule Take 1 capsule (30 mg total) by mouth daily before breakfast. 30 capsule 5   LANTUS SOLOSTAR 100 UNIT/ML Solostar Pen Inject 40 Units into the skin in the morning.     LORazepam (ATIVAN) 0.5 MG tablet Take 0.5 mg by mouth at bedtime.     meloxicam (MOBIC) 15 MG tablet Take 15 mg by mouth in the morning.     metFORMIN (GLUCOPHAGE-XR) 500 MG 24 hr tablet Take 2,000 mg by mouth daily with breakfast.     montelukast (SINGULAIR) 10 MG tablet TAKE 1 TABLET AT BEDTIME 90 tablet 0   nystatin (MYCOSTATIN) 100000 UNIT/ML suspension Take 5 mLs (500,000 Units total) by mouth in the morning and at bedtime. Swish and swallow 900 mL 3   pravastatin (PRAVACHOL) 10 MG tablet Take 10 mg by mouth at bedtime.      PRESCRIPTION MEDICATION Inject into the vein every 28 (twenty-eight) days. Immunoglobulin Hormone replacement therapy     traMADol (ULTRAM) 50 MG tablet Take 1 tablet (50 mg total) by mouth every 6 (six) hours as needed for severe pain (pain score 7-10). 12 tablet 0   TRUE METRIX BLOOD GLUCOSE TEST test strip      TRUEplus Lancets 33G MISC      valsartan (DIOVAN) 320 MG tablet Take 320 mg by mouth at bedtime.      VENTOLIN HFA 108 (90 Base) MCG/ACT inhaler INHALE 2 PUFFS EVERY 4 HOURS AS NEEDED FOR WHEEZING OR SHORTNESS OF BREATH. 18 g 1   Facility-Administered Medications Prior to Visit  Medication Dose Route Frequency Provider Last Rate Last Admin   Benralizumab SOSY 30 mg  30 mg Subcutaneous Q8 Thomes Dinning, MD   30 mg at 05/28/23 1510    Past Medical History:  Diagnosis Date   Angio-edema    Arthritis    Asthma    Flu march and May 2018   COPD (chronic obstructive pulmonary disease) (HCC)    Diabetes mellitus type 2, controlled (HCC)  x5 yrs   Eczema    GERD (gastroesophageal reflux disease)    Heart murmur    History of kidney stones    Hypertension    x 5 yrs   Hypogammaglobulinemia (HCC)    Pneumonia    Recurrent upper respiratory infection (URI)    Renal disorder    renal stones   Urticaria       Objective:     There were no vitals taken for this visit.         Assessment   No problem-specific Assessment & Plan notes found for this encounter.     Sandrea Hughs, MD 07/13/2023

## 2023-07-14 ENCOUNTER — Telehealth: Payer: Self-pay

## 2023-07-14 ENCOUNTER — Telehealth: Payer: Self-pay | Admitting: Internal Medicine

## 2023-07-14 ENCOUNTER — Ambulatory Visit: Payer: Medicare HMO | Admitting: Internal Medicine

## 2023-07-14 NOTE — Telephone Encounter (Signed)
 LVM for patient to call and reschedule the 07/14/23 appointment with Dr. Verlon Setting closed due to water damage

## 2023-07-14 NOTE — Telephone Encounter (Signed)
 Patient called stating she started experiencing a chronic cough, congestion, wheezing on Saturday. She is now coughing up yellow. Patient is wondering if Dr. Dellis Anes wants to see her in office or send her something in? Patient is aware Dr. Dellis Anes is not in office today. She is okay with me routing a message to him first. Patient is okay with communicating via MyChart.   Patient started adding on budesonide with her breathing treatments Saturday.    [facility] [address]

## 2023-07-15 DIAGNOSIS — E1169 Type 2 diabetes mellitus with other specified complication: Secondary | ICD-10-CM | POA: Diagnosis not present

## 2023-07-15 DIAGNOSIS — R609 Edema, unspecified: Secondary | ICD-10-CM | POA: Diagnosis not present

## 2023-07-15 DIAGNOSIS — J4541 Moderate persistent asthma with (acute) exacerbation: Secondary | ICD-10-CM | POA: Diagnosis not present

## 2023-07-16 MED ORDER — DOXYCYCLINE HYCLATE 100 MG PO TABS: 100.0000 mg | ORAL_TABLET | Freq: Two times a day (BID) | ORAL | 0 refills | Status: DC

## 2023-07-16 MED ORDER — PREDNISONE 10 MG PO TABS: ORAL_TABLET | ORAL | 0 refills | Status: DC

## 2023-07-16 NOTE — Telephone Encounter (Signed)
 Spoke with patient and she already was started on something from her PCP yesterday. I discontinued the medications.   Malachi Bonds, MD [facility]

## 2023-07-16 NOTE — Addendum Note (Signed)
 Addended by: Alfonse Spruce on: 07/16/2023 10:33 AM   Modules accepted: Orders

## 2023-07-16 NOTE — Telephone Encounter (Signed)
 I sent in prednisone and doxycycline. Can someone call the patient to let her know?

## 2023-07-16 NOTE — Addendum Note (Signed)
 Addended by: Alfonse Spruce on: 07/16/2023 10:29 AM   Modules accepted: Orders

## 2023-07-17 NOTE — Telephone Encounter (Signed)
 Dr gallagher has discontinued the doxy and pred he sent in   Pharmacy [facility] has canceled the rx's

## 2023-07-23 ENCOUNTER — Ambulatory Visit: Payer: Medicare HMO

## 2023-07-23 DIAGNOSIS — J4551 Severe persistent asthma with (acute) exacerbation: Secondary | ICD-10-CM | POA: Diagnosis not present

## 2023-07-23 DIAGNOSIS — J4550 Severe persistent asthma, uncomplicated: Secondary | ICD-10-CM

## 2023-08-02 DIAGNOSIS — D839 Common variable immunodeficiency, unspecified: Secondary | ICD-10-CM | POA: Diagnosis not present

## 2023-08-10 NOTE — Progress Notes (Unsigned)
 [name], female    DOB: Feb 04, 1956    MRN: [id number]   Brief patient profile:  72  yowf never smoker  former [place] pt self-referred back to pulmonary clinic in [place]  08/14/2023  for asthma   Symptom onset was in 2018 after an attack of the flu.  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 Repeat IgG testing showed normal levels of 794 09/2018 and 838 and 07/2018   01/2021 dc'd budesonide due to frequent thrush  02/12/2023 spirometry: FVC 74, FEV1 75%, ratio 0.80    History of Present Illness  08/14/2023  Pulmonary/ 1st office eval/ [facility] / [facility] / last prednisone 2 weeks prior to OV   Cc swelling x 3 weeks developed on pred but slow to resolve, mostly face and upper chest Dyspnea:  [place] stopping up to 2 miles 1/4 mile  since early in march 2025   Cough: none  Sleep: 6 in blocks/ 2 pillow does fine  SABA use: hfa  last 4-5 days  prior to OV - -very rarely need neb  02 GLO:VFIE    No obvious patterns in day to day or daytime pattern/variability or assoc excess/ purulent sputum or mucus plugs or hemoptysis or cp or chest tightness, subjective wheeze or overt sinus or hb symptoms.    Also denies any obvious fluctuation of symptoms with weather or environmental changes or other aggravating or alleviating  factors except as outlined above   No unusual exposure hx or h/o childhood pna/ asthma or knowledge of premature birth.  Current Allergies, Complete Past Medical History, Past Surgical History, Family History, and Social History were reviewed in [employer] record.  ROS  The following are not active complaints unless bolded Hoarseness, sore throat, dysphagia, dental problems, itching, sneezing,  nasal congestion or discharge of excess mucus or purulent secretions, ear ache,   fever, chills, sweats, unintended wt loss or wt gain, classically pleuritic or exertional cp,  orthopnea pnd or arm/hand swelling  or leg swelling, presyncope, palpitations, abdominal pain, anorexia, nausea, vomiting, diarrhea  or change in bowel habits or change in bladder habits, change in stools or change in urine, dysuria, hematuria,  rash, arthralgias, visual complaints, headache, numbness, weakness or ataxia or problems with walking or coordination,  change in mood or  memory.            Outpatient Medications Prior to Visit  Medication Sig Dispense Refill   albuterol (PROVENTIL) (2.5 MG/3ML) 0.083% nebulizer solution Take 3 mLs (2.5 mg total) by nebulization every 6 (six) hours as needed for wheezing or shortness of breath. 75 mL 3   Alcohol Swabs (B-D SINGLE USE SWABS REGULAR) PADS      amLODipine (NORVASC) 5 MG tablet TAKE 1 TABLET BY MOUTH AT BEDTIME     aspirin EC 81 MG tablet Take 1  tablet (81 mg total) by mouth 2 (two) times daily. 60 tablet 0   azelastine (ASTELIN) 0.1 % nasal spray PLACE 2 SPRAYS INTO BOTH NOSTRILS TWICE DAILY. 90 mL 1   benralizumab (FASENRA) 30 MG/ML prefilled syringe Inject 1 mL (30 mg total) into the skin every 8 (eight) weeks. 1 mL 6   Blood Glucose Calibration (TRUE METRIX LEVEL 1) Low SOLN      Blood Glucose Monitoring Suppl (TRUE METRIX AIR GLUCOSE METER) w/Device KIT      budeson-glycopyrrolate-formoterol (BREZTRI AEROSPHERE) 160-9-4.8 MCG/ACT AERO Inhale 2  puffs into the lungs twice daily with spacer. 10.7 g 11   budesonide (PULMICORT) 0.5 MG/2ML nebulizer solution Mix one vial with albuterol nebulizer solution and use every 4 hours as needed for respiratory flares. 120 mL 3   carvedilol (COREG) 25 MG tablet Take 25 mg by mouth 2 (two) times daily with a meal.     Cetirizine HCl (ZYRTEC PO) Take by mouth.     DROPLET PEN NEEDLES 32G X 4 MM MISC      famotidine (PEPCID) 20 MG tablet Take 20 mg by mouth at bedtime.     guaiFENesin (MUCINEX) 600 MG 12 hr tablet Take 1 tablet (600 mg total) by mouth 2 (two) times daily. 60 tablet 1   hydrochlorothiazide (HYDRODIURIL) 25 MG tablet Take 25 mg by mouth daily.     IMMUNE GLOBULIN, HUMAN, IM Inject into the muscle. Every 28 days     insulin aspart (NOVOLOG FLEXPEN) 100 UNIT/ML FlexPen Inject 2 Units into the skin 3 (three) times daily with meals. Per sliding scale     ipratropium (ATROVENT) 0.03 % nasal spray USE 2 SPRAYS IN EACH NOSTRIL THREE TIMES DAILY (Patient taking differently: Place 2 sprays into both nostrils daily as needed for rhinitis.) 90 mL 3   LANTUS SOLOSTAR 100 UNIT/ML Solostar Pen Inject 40 Units into the skin in the morning.     LORazepam (ATIVAN) 0.5 MG tablet Take 0.5 mg by mouth at bedtime.     meloxicam (MOBIC) 15 MG tablet Take 15 mg by mouth in the morning.     metFORMIN (GLUCOPHAGE-XR) 500 MG 24 hr tablet Take 2,000 mg by mouth daily with breakfast.     montelukast (SINGULAIR) 10 MG tablet TAKE 1 TABLET AT BEDTIME 90 tablet 0   pantoprazole (PROTONIX) 40 MG tablet Take 40 mg by mouth 2 (two) times daily.     pravastatin (PRAVACHOL) 10 MG tablet Take 10 mg by mouth at bedtime.      PRESCRIPTION MEDICATION Inject into the vein every 28 (twenty-eight) days. Immunoglobulin Hormone replacement therapy     tamsulosin (FLOMAX) 0.4 MG CAPS capsule Take 0.4 mg by mouth daily.     traMADol (ULTRAM) 50 MG tablet Take 1 tablet (50 mg total) by mouth every 6 (six) hours as needed for severe pain  (pain score 7-10). 12 tablet 0   TRUE METRIX BLOOD GLUCOSE TEST test strip      TRUEplus Lancets 33G MISC      valsartan (DIOVAN) 320 MG tablet Take 320 mg by mouth at bedtime.     VENTOLIN HFA 108 (90 Base) MCG/ACT inhaler INHALE 2 PUFFS EVERY 4 HOURS AS NEEDED FOR WHEEZING OR SHORTNESS OF BREATH. 18 g 1   JARDIANCE 25 MG TABS tablet Take 25 mg by mouth daily.     lansoprazole (PREVACID) 30 MG capsule Take 1 capsule (30 mg total) by mouth daily before breakfast. (Patient not taking: Reported on 08/14/2023) 30  capsule 5   nystatin (MYCOSTATIN) 100000 UNIT/ML suspension Take 5 mLs (500,000 Units total) by mouth in the morning and at bedtime. Swish and swallow 900 mL 3   Facility-Administered Medications Prior to Visit  Medication Dose Route Frequency Provider Last Rate Last Admin   Benralizumab SOSY 30 mg  30 mg Subcutaneous Q8 Thomes Dinning, MD   30 mg at 07/23/23 1504    Past Medical History:  Diagnosis Date   Angio-edema    Arthritis    Asthma    Flu march and May 2018   COPD (chronic obstructive pulmonary disease) (HCC)    Diabetes mellitus type 2, controlled (HCC)    x5 yrs   Eczema    GERD (gastroesophageal reflux disease)    Heart murmur    History of kidney stones    Hypertension    x 5 yrs   Hypogammaglobulinemia (HCC)    Pneumonia    Recurrent upper respiratory infection (URI)    Renal disorder    renal stones   Urticaria       Objective:     BP (!) 154/76   Pulse 76   Ht 5\' 2"  (1.575 m)   Wt 163 lb (73.9 kg)   SpO2 95%   BMI 29.81 kg/m   SpO2: 95 %RA   HEENT : Oropharynx  nl      Nasal turbinates nl    NECK :  without  apparent JVD/ palpable Nodes/TM    LUNGS: no acc muscle use,  Nl contour chest which is clear to A and P bilaterally without cough on insp or exp maneuvers   CV:  RRR  no s3 or murmur or increase in P2, and no edema   ABD:  soft and nontender   MS:  Gait nl   ext warm without deformities Or obvious joint  restrictions  calf tenderness, cyanosis or clubbing    SKIN: warm and dry without lesions    NEURO:  alert, approp, nl sensorium with  no motor or cerebellar deficits apparent.       Assessment   Asthmatic bronchitis Never smoker  - onset 2018 p viral uri assoc with hypogammaglobulinema - rx IL 5 blockers since 03/2017 first nucala then fasenra  - 08/14/2023  After extensive coaching inhaler device,  effectiveness =    75% (not able to do better s spacer which did not bring)   DDX of  difficult airways management almost all start with A and  include Adherence, Ace Inhibitors, Acid Reflux, Active Sinus Disease, Alpha 1 Antitripsin deficiency, Anxiety masquerading as Airways dz,  ABPA,  Allergy(esp in young), Aspiration (esp in elderly), Adverse effects of meds,  Active smoking or vaping, A bunch of PE's (a small clot burden can't cause this syndrome unless there is already severe underlying pulm or vascular dz with poor reserve) plus two Bs  = Bronchiectasis and Beta blocker use..and one C= CHF   Adherence is always the initial "prime suspect" and is a multilayered concern that requires a "trust but verify" approach in every patient - starting with knowing how to use medications, especially inhalers, correctly, keeping up with refills and understanding the fundamental difference between maintenance and prns vs those medications only taken for a very short course and then stopped and not refilled.  - see hfa teaching - return with all meds in hand using a trust but verify approach to confirm accurate Medication  Reconciliation The principal here is that until we are  certain that the  patients are doing what we've asked, it makes no sense to ask them to do more.   ? Acid (or non-acid) GERD > always difficult to exclude as up to 75% of pts in some series report no assoc GI/ Heartburn symptoms> rec continue max (24h)  acid suppression and diet restrictions/ reviewed     Allergy > continue fosenra/  singulair/ breztri and approp saba Re SABA :  I spent extra time with pt today reviewing appropriate use of albuterol for prn use on exertion with the following points: 1) saba is for relief of sob that does not improve by walking a slower pace or resting but rather if the pt does not improve after trying this first. 2) If the pt is convinced, as many are, that saba helps recover from activity faster then it's easy to tell if this is the case by re-challenging : ie stop, take the inhaler, then p 5 minutes try the exact same activity (intensity of workload) that just caused the symptoms and see if they are substantially diminished or not after saba 3) if there is an activity that reproducibly causes the symptoms, try the saba 15 min before the activity on alternate days   If in fact the saba really does help, then fine to continue to use it prn but advised may need to look closer at the maintenance regimen being used to achieve better control of airways disease with exertion.   ? A bunch of PEs >check d dimer   ? Betablocker: In the setting of respiratory symptoms of unknown etiology,  It would be preferable to use bystolic, the most beta -1  selective Beta blocker available in sample form, with bisoprolol the most selective generic choice  on the market, at least on a trial basis, to make sure the spillover Beta 2 effects of the less specific Beta blockers are not contributing to this patient's symptoms  (see hbp a/p)   ? CHF > check bnp   Essential hypertension Changed coreg to bisoprolol due to [facility] 08/14/2023 >>>  >>>> Rec bisoprolol 10 mg up to bid titate to SBP < 130 and Pulse < 75   DOE (dyspnea on exertion) Onset early march 2025   See asthma ddx for same list of concerns/ labs pending.   F/u with all meds / devices in 6 weeks, call sooner prn   Each maintenance medication was reviewed in detail including emphasizing most importantly the difference between maintenance and prns and  under what circumstances the prns are to be triggered using an action plan format where appropriate.  Total time for H and P, chart review, counseling, reviewing hfa device(s) , directly observing portions of ambulatory 02 saturation study/ and generating customized AVS unique to this office visit / same day charting = 45 min with complex pt new to me.                   Sandrea Hughs, MD 08/14/2023

## 2023-08-12 ENCOUNTER — Telehealth: Payer: Self-pay

## 2023-08-12 MED ORDER — BREZTRI AEROSPHERE 160-9-4.8 MCG/ACT IN AERO: INHALATION_SPRAY | RESPIRATORY_TRACT | 11 refills | Status: DC

## 2023-08-12 NOTE — Telephone Encounter (Signed)
 Received a fax from [facility] requesting a refill of Breztri. New rx has been sent to [facility]. Letters received via fax have been placed to go to bulk scanning.

## 2023-08-13 NOTE — Telephone Encounter (Signed)
 Received a fax from [facility] on the shipment status of medication for the patient. It has been placed to go to bulk scanning.

## 2023-08-14 ENCOUNTER — Ambulatory Visit (INDEPENDENT_AMBULATORY_CARE_PROVIDER_SITE_OTHER): Admitting: Internal Medicine

## 2023-08-14 ENCOUNTER — Encounter: Payer: Self-pay | Admitting: Internal Medicine

## 2023-08-14 VITALS — BP 154/76 | HR 76 | Ht 62.0 in | Wt 163.0 lb

## 2023-08-14 DIAGNOSIS — I10 Essential (primary) hypertension: Secondary | ICD-10-CM

## 2023-08-14 DIAGNOSIS — R0609 Other forms of dyspnea: Secondary | ICD-10-CM | POA: Diagnosis not present

## 2023-08-14 DIAGNOSIS — J45909 Unspecified asthma, uncomplicated: Secondary | ICD-10-CM

## 2023-08-14 MED ORDER — BISOPROLOL FUMARATE 10 MG PO TABS: ORAL_TABLET | ORAL | 11 refills | Status: DC

## 2023-08-14 NOTE — Assessment & Plan Note (Signed)
 Changed coreg to bisoprolol due to [facility] 08/14/2023 >>>  >>>> Rec bisoprolol 10 mg up to bid titate to SBP < 130 and Pulse < 75

## 2023-08-14 NOTE — Patient Instructions (Addendum)
 Stop corevidol and start bisoprolol 10 mg each am and extra dose in pm if needed for Systolic pressure over 130 or a pulse over 75   Please remember to go to the lab department   for your tests - we will call you with the results when they are available.   Can take extra dose of hydrodiuril if needed in short run only   Please schedule a follow up office visit in 6 weeks, call sooner if needed with all medications /inhalers/ solutions in hand so we can verify exactly what you are taking. This includes all medications from all doctors and over the counters - PLEASE separate them into two bags:  the ones you take automatically, no matter what, vs the ones you take just when you feel you need them "BAG #2 is UP TO YOU"  - this will really help [place] help you take your medications more effectively.      Marland Kitchen

## 2023-08-14 NOTE — Assessment & Plan Note (Signed)
 Never smoker  - onset 2018 p viral uri assoc with hypogammaglobulinema - rx IL 5 blockers since 03/2017 first nucala then fasenra  - 08/14/2023  After extensive coaching inhaler device,  effectiveness =    75% (not able to do better s spacer which did not bring)   DDX of  difficult airways management almost all start with A and  include Adherence, Ace Inhibitors, Acid Reflux, Active Sinus Disease, Alpha 1 Antitripsin deficiency, Anxiety masquerading as Airways dz,  ABPA,  Allergy(esp in young), Aspiration (esp in elderly), Adverse effects of meds,  Active smoking or vaping, A bunch of PE's (a small clot burden can't cause this syndrome unless there is already severe underlying pulm or vascular dz with poor reserve) plus two Bs  = Bronchiectasis and Beta blocker use..and one C= CHF   Adherence is always the initial "prime suspect" and is a multilayered concern that requires a "trust but verify" approach in every patient - starting with knowing how to use medications, especially inhalers, correctly, keeping up with refills and understanding the fundamental difference between maintenance and prns vs those medications only taken for a very short course and then stopped and not refilled.  - see hfa teaching - return with all meds in hand using a trust but verify approach to confirm accurate Medication  Reconciliation The principal here is that until we are certain that the  patients are doing what we've asked, it makes no sense to ask them to do more.   ? Acid (or non-acid) GERD > always difficult to exclude as up to 75% of pts in some series report no assoc GI/ Heartburn symptoms> rec continue max (24h)  acid suppression and diet restrictions/ reviewed     Allergy > continue fosenra/ singulair/ breztri and approp saba Re SABA :  I spent extra time with pt today reviewing appropriate use of albuterol for prn use on exertion with the following points: 1) saba is for relief of sob that does not improve by  walking a slower pace or resting but rather if the pt does not improve after trying this first. 2) If the pt is convinced, as many are, that saba helps recover from activity faster then it's easy to tell if this is the case by re-challenging : ie stop, take the inhaler, then p 5 minutes try the exact same activity (intensity of workload) that just caused the symptoms and see if they are substantially diminished or not after saba 3) if there is an activity that reproducibly causes the symptoms, try the saba 15 min before the activity on alternate days   If in fact the saba really does help, then fine to continue to use it prn but advised may need to look closer at the maintenance regimen being used to achieve better control of airways disease with exertion.   ? A bunch of PEs >check d dimer   ? Betablocker: In the setting of respiratory symptoms of unknown etiology,  It would be preferable to use bystolic, the most beta -1  selective Beta blocker available in sample form, with bisoprolol the most selective generic choice  on the market, at least on a trial basis, to make sure the spillover Beta 2 effects of the less specific Beta blockers are not contributing to this patient's symptoms  (see hbp a/p)   ? CHF > check bnp

## 2023-08-14 NOTE — Assessment & Plan Note (Signed)
 Onset early march 2025   See asthma ddx for same list of concerns/ labs pending.   F/u with all meds / devices in 6 weeks, call sooner prn   Each maintenance medication was reviewed in detail including emphasizing most importantly the difference between maintenance and prns and under what circumstances the prns are to be triggered using an action plan format where appropriate.  Total time for H and P, chart review, counseling, reviewing hfa device(s) , directly observing portions of ambulatory 02 saturation study/ and generating customized AVS unique to this office visit / same day charting = 45 min with complex pt new to me.

## 2023-08-15 ENCOUNTER — Ambulatory Visit (INDEPENDENT_AMBULATORY_CARE_PROVIDER_SITE_OTHER): Payer: Medicare HMO | Admitting: Allergy & Immunology

## 2023-08-15 ENCOUNTER — Other Ambulatory Visit: Payer: Self-pay

## 2023-08-15 VITALS — BP 120/70 | HR 88 | Temp 98.0°F | Resp 16 | Wt 162.6 lb

## 2023-08-15 DIAGNOSIS — K219 Gastro-esophageal reflux disease without esophagitis: Secondary | ICD-10-CM | POA: Diagnosis not present

## 2023-08-15 DIAGNOSIS — J4550 Severe persistent asthma, uncomplicated: Secondary | ICD-10-CM | POA: Diagnosis not present

## 2023-08-15 DIAGNOSIS — J310 Chronic rhinitis: Secondary | ICD-10-CM

## 2023-08-15 DIAGNOSIS — D806 Antibody deficiency with near-normal immunoglobulins or with hyperimmunoglobulinemia: Secondary | ICD-10-CM | POA: Diagnosis not present

## 2023-08-15 NOTE — Progress Notes (Signed)
 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  - Daily controller medication(s): Breztri two puffs twice daily + Fasenra every 8 weeks - Rescue medications: albuterol 4 puffs every 4-6 hours as needed or DuoNeb nebulizer one vial every 4-6 hours as needed  - Changes during respiratory flares: Add on Pulmicort (budesonide) 0.5 mg twice daily via nebulizer for 1-2 weeks - Asthma control goals:  * Full participation in all desired activities (may need albuterol before activity) * Albuterol use two time or less a week on average (not counting use with activity) * Cough interfering with sleep two time or less a month * Oral steroids no more than once a year * No hospitalizations     2. Chronic non-allergic rhinitis - with overlying sinusitis - Continue with Zyrtec 10mg  at night.   3. 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  - Continue with all of the premedications, including hydration.   4. Return in about 4 months (around 12/15/2023). You can have the follow up appointment with Dr. Dellis Anes or a Nurse Practicioner (our Nurse Practitioners are excellent and always have Physician oversight!).    Subjective:   Kathy Tran is a 68 y.o. 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  Discussed the use of AI scribe software for clinical note transcription with the patient and/or guardian, who gave verbal consent to proceed.  Kathy Tran is a 68 y.o. female presenting for a follow up visit.  She was last seen in January 2025.  At that time, we did not do any testing.  We gave her a Depo-Medrol injection and continued her Breztri 2 puffs twice daily and Fasenra every 8 weeks.  She has Pulmicort that she has during flares.  We continue with Zyrtec 10 mg daily and start doxycycline twice a day for 2 weeks due to her breakthrough infection.  We continue with her IVIG dosing.  She has been trying to space out the infusions and was at infusions every 8  weeks at the time when I saw her.  Since last visit, she has not done well.  Asthma/Respiratory Symptom History: She has a history of asthma and was previously on carvedilol, which may exacerbate asthma symptoms. Her new doctor has discontinued carvedilol due to its potential to cause breathing issues in asthmatic patients. She uses a nebulizer when sick but does not currently need it. She is on Barclay, which she reports is  effective.  She also remains on Breztri 2 puffs twice daily.  She has been on carvedilol for blood pressure management for about 20 years, but it was recently discontinued due to concerns about its impact on her asthma. She feels groggy and needs naps despite adequate sleep, which she attributes to her blood pressure medication. She has been prescribed a new blood pressure medication, but did not specify the name.  Allergic Rhinitis Symptom History: She remains on the Zyrtec 10 mg daily.  This seems to be working well to control her symptoms.  Infection Symptom History: She experiences recurrent respiratory infections, particularly after the holidays and following exposure to her grandchildren, who are in school. A significant episode occurred in January, which she attributes to being put to sleep for kidney stone surgery on December 30th. Her symptoms worsened in March, leading to a prescription of prednisone, which she dislikes due to its side effects. During a recent episode, her oxygen levels dropped to 72, causing significant concern. She receives infusions every six weeks and reports feeling better now, although she experienced breakthrough infections.  We have tried to space her out on her infusions due to her personal preference, but when we went to 8 weeks she did develop a lot more infections.  She is now getting infusions every 6 weeks, but continues to have some regular respiratory infections.  She really does not want to go back to every 4 weeks, and would like to hold off until the next visit.  She has a history of kidney stones, having had 14 since 1989, and underwent surgery for them in December. She experiences frequent urinary tract infections and was recently treated with Septra, which she reports is working. She did not have a urine culture done but notes that she often gets these infections.   Otherwise, there have been no changes to her past medical history, surgical history, family  history, or social history.    Review of systems otherwise negative other than that mentioned in the HPI.    Objective:   Blood pressure 120/70, pulse 88, temperature 98 F (36.7 C), resp. rate 16, weight 162 lb 9.6 oz (73.8 kg), SpO2 95%. Body mass index is 29.74 kg/m.    Physical Exam Vitals reviewed.  Constitutional:      Appearance: She is well-developed. She is not ill-appearing, toxic-appearing or diaphoretic.     Comments: Very bubbly  HENT:     Head: Normocephalic and atraumatic.     Right Ear: Ear canal and external ear normal.     Left Ear: Ear canal and external ear normal.     Nose: No nasal deformity, septal deviation, mucosal edema or rhinorrhea.     Right Turbinates: Enlarged, swollen and pale.     Left Turbinates: Enlarged, swollen and pale.     Right Sinus: No maxillary sinus tenderness or frontal sinus tenderness.     Left Sinus: No maxillary sinus tenderness or frontal sinus tenderness.     Comments: No nasal polyps.    Mouth/Throat:     Mouth:  Mucous membranes are not pale and not dry.     Pharynx: Uvula midline.     Comments: She does have what appears to be lesions on her hard palate.  She says that these been there for a while and they come and go.  She denies any dysphagia as well as fevers.  She has been around her grandchildren, but they have not been sick at all. Eyes:     General: Lids are normal. No allergic shiner.       Right eye: No discharge.        Left eye: No discharge.     Conjunctiva/sclera: Conjunctivae normal.     Right eye: Right conjunctiva is not injected. No chemosis.    Left eye: Left conjunctiva is not injected. No chemosis.    Pupils: Pupils are equal, round, and reactive to light.  Cardiovascular:     Rate and Rhythm: Normal rate and regular rhythm.     Heart sounds: Normal heart sounds.  Pulmonary:     Effort: Pulmonary effort is normal. No tachypnea, accessory muscle usage or respiratory distress.     Breath sounds:  Normal breath sounds. No wheezing, rhonchi or rales.     Comments: Expiratory wheezing in the bilateral lung fields, especially at the bases. Chest:     Chest wall: No tenderness.  Lymphadenopathy:     Cervical: No cervical adenopathy.  Skin:    General: Skin is warm.     Capillary Refill: Capillary refill takes less than 2 seconds.     Coloration: Skin is not pale.     Findings: No abrasion, erythema, petechiae or rash. Rash is not papular, urticarial or vesicular.     Comments: No eczematous or urticarial lesions noted.  Neurological:     Mental Status: She is alert.  Psychiatric:        Behavior: Behavior is cooperative.      Diagnostic studies:    Spirometry: results normal (FEV1: 1.61/71%, FVC: 2.02/70%, FEV1/FVC: 80%).    Spirometry consistent with normal pattern.   Allergy Studies: none        Malachi Bonds, MD  [facility]

## 2023-08-15 NOTE — Patient Instructions (Addendum)
 1. 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  - Daily controller medication(s): Breztri two puffs twice daily + Fasenra every 8 weeks - Rescue medications: albuterol 4 puffs every 4-6 hours as needed or DuoNeb nebulizer one vial every 4-6 hours as needed  - Changes during respiratory flares: Add on Pulmicort (budesonide) 0.5 mg twice daily via nebulizer for 1-2 weeks - Asthma control goals:  * Full participation in all desired activities (may need albuterol before activity) * Albuterol use two time or less a week on average (not counting use with activity) * Cough interfering with sleep two time or less a month * Oral steroids no more than once a year * No hospitalizations     2. Chronic non-allergic rhinitis - with overlying sinusitis - Continue with Zyrtec 10mg  at night.   3. 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  - Continue with all of the premedications, including hydration.   4. Return in about 4 months (around 12/15/2023). You can have the follow up appointment with Dr. Dellis Anes or a Nurse Practicioner (our Nurse Practitioners are excellent and always have Physician oversight!).    Please inform us of any Emergency Department visits, hospitalizations, or changes in symptoms. Call us before going to the ED for breathing or allergy symptoms since we might be able to fit you in for a sick visit. Feel free to contact us anytime with any questions, problems, or concerns.  It was a pleasure to see you again today!  Websites that have reliable patient information: 1. American Academy of Asthma, Allergy, and Immunology: www.aaaai.org 2. Food Allergy Research and Education (FARE): foodallergy.org 3. Mothers of Asthmatics: http://www.asthmacommunitynetwork.org 4. American College of Allergy, Asthma, and Immunology: www.acaai.org      "Like"  [place] on Facebook and Instagram for our latest updates!      A healthy democracy works best when [employer] participate! Make sure you are registered to vote! If you have moved or changed any of your contact information, you will need to get this updated before voting! Scan the QR codes below to learn more!

## 2023-08-19 ENCOUNTER — Encounter: Payer: Self-pay | Admitting: Allergy & Immunology

## 2023-08-22 LAB — CBC WITH DIFFERENTIAL/PLATELET
Basophils Absolute: 0 10*3/uL (ref 0.0–0.2)
Basos: 0 %
EOS (ABSOLUTE): 0 10*3/uL (ref 0.0–0.4)
Eos: 0 %
Hematocrit: 40.8 % (ref 34.0–46.6)
Hemoglobin: 13.8 g/dL (ref 11.1–15.9)
Immature Grans (Abs): 0 10*3/uL (ref 0.0–0.1)
Immature Granulocytes: 0 %
Lymphocytes Absolute: 1.3 10*3/uL (ref 0.7–3.1)
Lymphs: 27 %
MCH: 33 pg (ref 26.6–33.0)
MCHC: 33.8 g/dL (ref 31.5–35.7)
MCV: 98 fL — ABNORMAL HIGH (ref 79–97)
Monocytes Absolute: 0.3 10*3/uL (ref 0.1–0.9)
Monocytes: 6 %
Neutrophils Absolute: 3.1 10*3/uL (ref 1.4–7.0)
Neutrophils: 67 %
Platelets: 189 10*3/uL (ref 150–450)
RBC: 4.18 x10E6/uL (ref 3.77–5.28)
RDW: 12.1 % (ref 11.7–15.4)
WBC: 4.7 10*3/uL (ref 3.4–10.8)

## 2023-08-22 LAB — BASIC METABOLIC PANEL WITH GFR
BUN/Creatinine Ratio: 30 — ABNORMAL HIGH (ref 12–28)
BUN: 21 mg/dL (ref 8–27)
CO2: 22 mmol/L (ref 20–29)
Calcium: 9.8 mg/dL (ref 8.7–10.3)
Chloride: 105 mmol/L (ref 96–106)
Creatinine, Ser: 0.69 mg/dL (ref 0.57–1.00)
Glucose: 114 mg/dL — ABNORMAL HIGH (ref 70–99)
Potassium: 4.2 mmol/L (ref 3.5–5.2)
Sodium: 141 mmol/L (ref 134–144)
eGFR: 96 mL/min/{1.73_m2} (ref >59)

## 2023-08-22 LAB — ALPHA-1-ANTITRYPSIN PHENOTYP: A-1 Antitrypsin: 140 mg/dL (ref 101–187)

## 2023-08-22 LAB — HEPATIC FUNCTION PANEL
ALT: 20 IU/L (ref 0–32)
AST: 22 IU/L (ref 0–40)
Albumin: 4.5 g/dL (ref 3.9–4.9)
Alkaline Phosphatase: 57 IU/L (ref 44–121)
Bilirubin Total: 0.5 mg/dL (ref 0.0–1.2)
Bilirubin, Direct: 0.19 mg/dL (ref 0.00–0.40)
Total Protein: 6.8 g/dL (ref 6.0–8.5)

## 2023-08-22 LAB — BRAIN NATRIURETIC PEPTIDE: BNP: 48 pg/mL (ref 0.0–100.0)

## 2023-08-22 LAB — TSH: TSH: 2.68 u[IU]/mL (ref 0.450–4.500)

## 2023-08-22 LAB — D-DIMER, QUANTITATIVE: D-DIMER: 0.94 mg{FEU}/L — ABNORMAL HIGH (ref 0.00–0.49)

## 2023-08-28 ENCOUNTER — Other Ambulatory Visit: Payer: Self-pay | Admitting: Allergy & Immunology

## 2023-09-17 ENCOUNTER — Ambulatory Visit

## 2023-09-17 DIAGNOSIS — J4550 Severe persistent asthma, uncomplicated: Secondary | ICD-10-CM

## 2023-09-27 DIAGNOSIS — D839 Common variable immunodeficiency, unspecified: Secondary | ICD-10-CM | POA: Diagnosis not present

## 2023-09-29 ENCOUNTER — Ambulatory Visit: Admitting: Internal Medicine

## 2023-10-09 DIAGNOSIS — D839 Common variable immunodeficiency, unspecified: Secondary | ICD-10-CM | POA: Diagnosis not present

## 2023-11-10 ENCOUNTER — Ambulatory Visit (INDEPENDENT_AMBULATORY_CARE_PROVIDER_SITE_OTHER)

## 2023-11-10 ENCOUNTER — Telehealth: Payer: Self-pay | Admitting: Allergy & Immunology

## 2023-11-10 DIAGNOSIS — J4550 Severe persistent asthma, uncomplicated: Secondary | ICD-10-CM | POA: Diagnosis not present

## 2023-11-10 NOTE — Telephone Encounter (Signed)
 Kathy Tran called and stated she has Thrush in her throat/mouth from her recuse inhaler and using the Breztri  twice per day. She has tried rinsing multiple times per day, and using yogurt and other remedies but they are not working. She would like to know if we can call her in a prescription for Diflucan . She uses the [facility] on [address] in [place]. Best contact [phone].

## 2023-11-11 MED ORDER — FLUCONAZOLE 150 MG PO TABS: 150.0000 mg | ORAL_TABLET | Freq: Every day | ORAL | 0 refills | Status: AC

## 2023-11-11 NOTE — Telephone Encounter (Signed)
 I sent in Diflucan. Please let the patient know.   Malachi Bonds, MD [facility]

## 2023-11-12 NOTE — Telephone Encounter (Signed)
Patient informed  No questions

## 2023-11-24 ENCOUNTER — Encounter: Payer: Self-pay | Admitting: Internal Medicine

## 2023-11-24 ENCOUNTER — Ambulatory Visit (INDEPENDENT_AMBULATORY_CARE_PROVIDER_SITE_OTHER): Admitting: Internal Medicine

## 2023-11-24 VITALS — BP 144/68 | HR 69 | Ht 62.0 in | Wt 158.6 lb

## 2023-11-24 DIAGNOSIS — R0609 Other forms of dyspnea: Secondary | ICD-10-CM

## 2023-11-24 DIAGNOSIS — J45909 Unspecified asthma, uncomplicated: Secondary | ICD-10-CM | POA: Diagnosis not present

## 2023-11-24 DIAGNOSIS — I10 Essential (primary) hypertension: Secondary | ICD-10-CM | POA: Diagnosis not present

## 2023-11-24 NOTE — Assessment & Plan Note (Signed)
 Never smoker  - onset 2018 p viral uri assoc with hypogammaglobulinema - rx [place] 5 blockers since 03/2017 first nucala  then fasenra   - 08/14/2023  After extensive coaching inhaler device,  effectiveness =    75% (not able to do better s spacer which did not bring)   All goals of chronic asthma control met including optimal function and elimination of symptoms with minimal need for rescue therapy.  Contingencies discussed in full including contacting this office immediately if not controlling the symptoms using the rule of two's.     She is over using saba prior to exertion rather than as rescue and advised to try it every other day before ex to be sure she really needs it with breztri  on board.  F/u can be q 6 m, sooner prn

## 2023-11-24 NOTE — Assessment & Plan Note (Signed)
 Onset early march 2025  - 08/14/23   Eos 0.0/ alpha one AT  phenotype MM   level 140    Resolved to her satisfaction

## 2023-11-24 NOTE — Progress Notes (Signed)
 [name], female    DOB: [birth year]    MRN: [id number]   Brief patient profile:  59  yowf never smoker/MM  former [place] pt self-referred back to pulmonary clinic in [place]  08/14/2023  for asthma   Symptom onset was in 2018 after an attack of the flu.  She reports an attack of bronchitis in 2016 Repeated ear infections as a child requiring multiple ear tubes through 1990s and 2000, sinus surgery 1998 Hospitalization 07/2016 for bronchospasm , remained on prednisone  for 3 months IgG  398  09/2016 , given IVIG Bronchoscopy 10/2016, 01/2017 for mucous plugs Allergy consultation 2018 >> maintained on IV IgG every 4 weeks - started 03/2017 , Nucala  every 4 weeks and Trelegy, prednisone  tapered 05/2017   [facility] pulmonary consultation 07/2017 was reviewed >> felt that chronic prednisone  use caused hypogammaglobulinemia   However when IVIG was stopped for 3 months her symptoms returned and she has been maintained on IVIG infusions since. Repeat IgG testing showed normal levels of 794 09/2018 and 838 and 07/2018   01/2021 dc'd budesonide  due to frequent thrush  02/12/2023 spirometry: FVC 74, FEV1 75%, ratio 0.80    History of Present Illness  08/14/2023  Pulmonary/ 1st office eval/ Kathy Tran / [place] [facility] / last prednisone  2 weeks prior to OV   Cc swelling x 3 weeks developed on pred but slow to resolve, mostly face and upper chest Dyspnea:  [place] stopping up to 2 miles 1/4 mile  since early in march 2025   Cough: none  Sleep: 6 in blocks/ 2 pillow does fine  SABA use: hfa  last 4-5 days  prior to OV - -very rarely need neb  02 [id number]  Rec Stop corevidol and start bisoprolol  10 mg each am and extra dose in pm if needed for Systolic pressure over 130 or a pulse over 75  Can take extra dose of hydrodiuril  if needed in short run only  Please schedule a follow up office visit in 6 weeks, call sooner if needed with all medications /inhalers/ solutions in hand   -  08/14/23   Eos 0.0/ alpha one AT  phenotype MM   level 140    11/24/2023  f/u ov/[place] office/Kathy Tran re: AB  maint on breztri  2 in am  did  bring meds  No chief complaint on file.  Dyspnea:  improved activity  Cough: none  Sleeping: 6 in bed blocks s    resp cc  SABA use: just before exertion  02: none      No obvious day to day or daytime variability or assoc excess/ purulent sputum or mucus plugs or hemoptysis or cp or chest tightness, subjective wheeze or overt sinus or hb symptoms.    Also denies any obvious fluctuation of symptoms with weather or environmental changes or other aggravating or alleviating factors except as outlined above   No unusual exposure hx or h/o childhood pna/ asthma or knowledge of premature birth.  Current Allergies, Complete Past Medical History, Past Surgical History, Family History, and Social History were reviewed in [employer] record.  ROS  The following are not active complaints unless bolded Hoarseness, sore throat, dysphagia, dental problems, itching, sneezing,  nasal congestion or discharge of excess mucus or purulent secretions, ear ache,   fever, chills, sweats, unintended wt loss or wt gain, classically pleuritic or exertional cp,  orthopnea pnd or arm/hand swelling  or leg swelling, presyncope, palpitations, abdominal pain,  anorexia, nausea, vomiting, diarrhea  or change in bowel habits or change in bladder habits, change in stools or change in urine, dysuria, hematuria,  rash, arthralgias, visual complaints, headache, numbness, weakness or ataxia or problems with walking or coordination,  change in mood or  memory.        Current Meds  Medication Sig   Alcohol  Swabs  (B-D SINGLE USE SWABS  REGULAR) PADS    amLODipine  (NORVASC ) 5 MG tablet TAKE 1 TABLET BY MOUTH AT BEDTIME   benralizumab  (FASENRA ) 30 MG/ML prefilled syringe Inject 1 mL (30 mg total) into the skin every 8 (eight) weeks.   bisoprolol  (ZEBETA ) 10 MG tablet One  twice daily   Blood Glucose Calibration (TRUE METRIX LEVEL 1) Low SOLN    Blood Glucose Monitoring Suppl (TRUE METRIX AIR GLUCOSE METER) w/Device KIT    budeson-glycopyrrolate -formoterol  (BREZTRI  AEROSPHERE) 160-9-4.8 MCG/ACT AERO Inhale 2 puffs into the lungs twice daily with spacer.   budesonide  (PULMICORT ) 0.5 MG/2ML nebulizer solution Mix one vial with albuterol  nebulizer solution and use every 4 hours as needed for respiratory flares.   DROPLET PEN NEEDLES 32G X 4 MM MISC    famotidine  (PEPCID ) 20 MG tablet Take 20 mg by mouth at bedtime.   fluconazole  (DIFLUCAN ) 150 MG tablet Take 1 tablet (150 mg total) by mouth daily for 14 days.   hydrochlorothiazide  (HYDRODIURIL ) 25 MG tablet Take 25 mg by mouth daily. (Patient taking differently: Take 25 mg by mouth daily. As needed)   IMMUNE GLOBULIN , HUMAN, IM Inject into the muscle. Every 28 days   insulin  aspart (NOVOLOG  FLEXPEN) 100 UNIT/ML FlexPen Inject 2 Units into the skin 3 (three) times daily with meals. Per sliding scale   ipratropium-albuterol  (DUONEB) 0.5-2.5 (3) MG/3ML SOLN Take 3 mLs by nebulization every 4 (four) hours as needed.   lansoprazole  (PREVACID ) 30 MG capsule Take 30 mg by mouth daily as needed.   LANTUS  SOLOSTAR 100 UNIT/ML Solostar Pen Inject 40 Units into the skin in the morning.   LORazepam  (ATIVAN ) 0.5 MG tablet Take 0.5 mg by mouth at bedtime.   meloxicam (MOBIC) 15 MG tablet Take 15 mg by mouth in the morning.   metFORMIN  (GLUCOPHAGE -XR) 500 MG 24 hr tablet Take 2,000 mg by mouth daily with breakfast.   montelukast  (SINGULAIR ) 10 MG tablet TAKE 1 TABLET AT BEDTIME   pantoprazole  (PROTONIX ) 40 MG tablet Take 40 mg by mouth 2 (two) times daily.   pravastatin  (PRAVACHOL ) 10 MG tablet Take 10 mg by mouth at bedtime.    TRUE METRIX BLOOD GLUCOSE TEST test strip    TRUEplus Lancets 33G MISC    valsartan (DIOVAN) 320 MG tablet Take 320 mg by mouth at bedtime.   VENTOLIN  HFA 108 (90 Base) MCG/ACT inhaler INHALE 2 PUFFS  EVERY 4 HOURS AS NEEDED FOR WHEEZING OR SHORTNESS OF BREATH.   Current Facility-Administered Medications for the 11/24/23 encounter (Office Visit) with Kathy Tran, Kathy Tran  Medication   Benralizumab  SOSY 30 mg            Past Medical History:  Diagnosis Date   Angio-edema    Arthritis    Asthma    Flu march and May 2018   COPD (chronic obstructive pulmonary disease) (HCC)    Diabetes mellitus type 2, controlled (HCC)    x5 yrs   Eczema    GERD (gastroesophageal reflux disease)    Heart murmur    History of kidney stones    Hypertension    x 5 yrs  Hypogammaglobulinemia (HCC)    Pneumonia    Recurrent upper respiratory infection (URI)    Renal disorder    renal stones   Urticaria       Objective:    Wts  11/24/2023         158  08/15/23 162 lb 9.6 oz (73.8 kg)  08/14/23 163 lb (73.9 kg)  05/12/23 (!) 342 lb 9.5 oz (155.4 kg)      Vital signs reviewed  11/24/2023  - Note at rest 02 sats  99% on RA   General appearance:    very pleasant amb wf all smiles today    HEENT : Oropharynx  nl      Nasal turbinates nl    NECK :  without  apparent JVD/ palpable Nodes/TM    LUNGS: no acc muscle use,  Nl contour chest which is clear to A and P bilaterally without cough on insp or exp maneuvers   CV:  RRR  no s3 or murmur or increase in P2, and no edema   ABD:  soft and nontender   MS:  Gait nl   ext warm without deformities Or obvious joint restrictions  calf tenderness, cyanosis or clubbing    SKIN: warm and dry without lesions    NEURO:  alert, approp, nl sensorium with  no motor or cerebellar deficits apparent.           Assessment

## 2023-11-24 NOTE — Assessment & Plan Note (Signed)
 D/c coreg  08/14/23 due to concerns with asthma > rx bisoprolol  10 mg daily   - Adequate control on present rx, reviewed in detail with pt > no change in rx needed  but f/u for pb should be per PCP/cards if approp          Each maintenance medication was reviewed in detail including emphasizing most importantly the difference between maintenance and prns and under what circumstances the prns are to be triggered using an action plan format where appropriate.  Total time for H and P, chart review, counseling, reviewing hfa  device(s) and generating customized AVS unique to this office visit / same day charting = 25 min

## 2023-11-24 NOTE — Patient Instructions (Addendum)
 No change in medications unless breathing worse and if so increase the breztri  to Take 2 puffs first thing in am and then another 2 puffs about 12 hours later.     Please schedule a follow up visit in 6  months but call sooner if needed

## 2023-12-15 ENCOUNTER — Ambulatory Visit: Admitting: Allergy & Immunology

## 2023-12-26 ENCOUNTER — Ambulatory Visit (INDEPENDENT_AMBULATORY_CARE_PROVIDER_SITE_OTHER): Admitting: Allergy & Immunology

## 2023-12-26 ENCOUNTER — Encounter: Payer: Self-pay | Admitting: Allergy & Immunology

## 2023-12-26 VITALS — BP 122/62 | HR 72 | Temp 98.2°F | Resp 14 | Wt 161.2 lb

## 2023-12-26 DIAGNOSIS — K219 Gastro-esophageal reflux disease without esophagitis: Secondary | ICD-10-CM | POA: Diagnosis not present

## 2023-12-26 DIAGNOSIS — J4550 Severe persistent asthma, uncomplicated: Secondary | ICD-10-CM

## 2023-12-26 DIAGNOSIS — J310 Chronic rhinitis: Secondary | ICD-10-CM | POA: Diagnosis not present

## 2023-12-26 DIAGNOSIS — D806 Antibody deficiency with near-normal immunoglobulins or with hyperimmunoglobulinemia: Secondary | ICD-10-CM | POA: Diagnosis not present

## 2023-12-26 MED ORDER — VENTOLIN HFA 108 (90 BASE) MCG/ACT IN AERS: 2.0000 | INHALATION_SPRAY | RESPIRATORY_TRACT | 1 refills | Status: AC | PRN

## 2023-12-26 NOTE — Progress Notes (Signed)
 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  She has been off of immunoglobulin replacement for nearly 6 months without any breakthrough infections at all.  We were already weaning her off anyway and she seemed to be doing fine, but there does not seem to be a reason to restarting at this point.  We are going to recheck all of her immune labs to make sure they still look good.  In the past, she had poor response to Streptococcus pneumonia which is how we got her immunoglobulin approved at the first place.  Hopefully her immune system has recovered.  Her asthma is under excellent control with the Fasenra  and the Breztri .  Plan/Recommendations:   1. 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 - Daily controller medication(s): Breztri  two puffs one to two times daily + Fasenra  every 8 weeks - Rescue medications: albuterol  4 puffs every 4-6 hours as needed or DuoNeb nebulizer one vial every 4-6 hours as needed  - Changes during respiratory flares: Add on Pulmicort  (budesonide ) 0.5 mg twice daily via nebulizer for 1-2 weeks - Asthma control goals:  * Full participation in all desired activities (may need albuterol  before activity) * Albuterol  use two time or less a week on average (not counting use with activity) * Cough interfering with sleep two time or less a month * Oral steroids no more than once a year * No hospitalizations     2. Chronic non-allergic rhinitis - with overlying sinusitis - Continue with Zyrtec 10mg  at night.   3. 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   - I am so glad that you have been so well.   4. Return in about 4 months (around 04/26/2024). You can have the follow up appointment with Dr. Iva or a Nurse Practicioner (our Nurse Practitioners are excellent and always have Physician oversight!).    Subjective:   Kathy Tran is a 68 y.o. 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  Discussed the use of AI scribe software for clinical note transcription with the patient and/or guardian,  who gave verbal consent to proceed.  Kathy Tran is a 68 y.o. female presenting for a follow up visit.  She was last seen in April 2025.  At that time, lung testing was stellar.  We continue with the history of tibial stress daily at the center every 8 weeks.  For her nonallergic rhinitis, she was continued on Zyrtec.  She was doing well with her immunoglobulin infusions which we had spaced out to every 8 weeks.  Since last visit, she has  done well.   Asthma/Respiratory Symptom History: She has experienced recurrent oral thrush, which she attributes to the use of her rescue inhaler while working outside in high temperatures. She was prescribed a 14-day course of Diflucan , which resolved the thrush. She has since reduced her use of Breztri  to once daily in the morning and has not experienced a recurrence of thrush. She reports that her breathing is fine and she did not have any problems when performing her recent lung function test.  She continues to receive Fasenra  injections every two months at the clinic. She has not required antibiotics since February, following a kidney stone surgery in December that led to a prolonged illness, which she attributes to an infection acquired during her hospital stay.  There was a change in her blood pressure medication by Dr. Darlean, who switched her from carvedilol  due to its respiratory side effects. She is currently on a new medication, which she takes twice daily, but cannot recall the name. Her blood pressure tends to be higher in the evening.  Infection Symptom History: She abruptly stopped her immunoglobulin treatment, specifically Privigen , after a negative experience with a new home healthcare nurse who was unable to properly administer the infusion. The nurse was unable to find a vein and incorrectly mixed the medication, leading her to ask the nurse to leave. She has not had an infusion since March and notes feeling better without it, although she acknowledges her immunoglobulin levels are 'terrible'.  She reports that her home healthcare nurse changed. She felt that this new home health care nurse did not know what she is doing. She has not had an infusion since March 2025. She feels good despite all of this lack of immunoglobulin replacement.   Socially, she visits her children in [place], where she lived for twenty years. Her children, aged 30 and 59, do not have children of their own. She  has two step-grandchildren, aged 25 and 60, and enjoys attending their sports events despite the heat.  She is scheduled to go to baseball tournament tomorrow and she does not seem very excited about it.  Otherwise, there have been no changes to her past medical history, surgical history, family history, or social history.    Review of systems otherwise negative other than that mentioned in the HPI.    Objective:   Blood pressure 122/62, pulse 72, temperature 98.2 F (36.8 C), resp. rate 14, weight 161 lb 4 oz (73.1 kg), SpO2 95%. Body mass index is 29.49 kg/m.    Physical Exam Vitals reviewed.  Constitutional:      Appearance: She is well-developed. She is not ill-appearing, toxic-appearing or diaphoretic.     Comments: Very bubbly  HENT:     Head: Normocephalic and atraumatic.     Right Ear: Ear canal and external ear normal.     Left Ear: Ear canal and external ear normal.     Nose: No nasal deformity, septal deviation, mucosal edema or rhinorrhea.  Right Turbinates: Enlarged, swollen and pale.     Left Turbinates: Enlarged, swollen and pale.     Right Sinus: No maxillary sinus tenderness or frontal sinus tenderness.     Left Sinus: No maxillary sinus tenderness or frontal sinus tenderness.     Comments: No nasal polyps.    Mouth/Throat:     Lips: Pink.     Mouth: Mucous membranes are moist. Mucous membranes are not pale and not dry.     Pharynx: Uvula midline.     Comments: She does have what appears to be lesions on her hard palate.  She says that these been there for a while and they come and go.  She denies any dysphagia as well as fevers.  She has been around her grandchildren, but they have not been sick at all. Eyes:     General: Lids are normal. Allergic shiner present.        Right eye: No discharge.        Left eye: No discharge.     Conjunctiva/sclera: Conjunctivae normal.     Right eye: Right conjunctiva is not injected. No chemosis.    Left eye: Left  conjunctiva is not injected. No chemosis.    Pupils: Pupils are equal, round, and reactive to light.  Cardiovascular:     Rate and Rhythm: Normal rate and regular rhythm.     Heart sounds: Normal heart sounds.  Pulmonary:     Effort: Pulmonary effort is normal. No tachypnea, accessory muscle usage or respiratory distress.     Breath sounds: Normal breath sounds. No wheezing, rhonchi or rales.     Comments: Moving air well in all lung fields. No increased work of breathing noted.  Chest:     Chest wall: No tenderness.  Lymphadenopathy:     Cervical: No cervical adenopathy.  Skin:    General: Skin is warm.     Capillary Refill: Capillary refill takes less than 2 seconds.     Coloration: Skin is not pale.     Findings: No abrasion, erythema, petechiae or rash. Rash is not papular, urticarial or vesicular.     Comments: No eczematous or urticarial lesions noted.  Neurological:     Mental Status: She is alert.  Psychiatric:        Behavior: Behavior is cooperative.      Diagnostic studies:    Spirometry: results normal (FEV1: 1.73/77%, FVC: 2.18/76%, FEV1/FVC: 79%).    Spirometry consistent with normal pattern. This looks excellent.   Allergy Studies: none        Marty Shaggy, MD  [facility] 

## 2023-12-26 NOTE — Patient Instructions (Addendum)
 1. 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 - Daily controller medication(s): Breztri  two puffs one to two times daily + Fasenra  every 8 weeks - Rescue medications: albuterol  4 puffs every 4-6 hours as needed or DuoNeb nebulizer one vial every 4-6 hours as needed  - Changes during respiratory flares: Add on Pulmicort  (budesonide ) 0.5 mg twice daily via nebulizer for 1-2 weeks - Asthma control goals:  * Full participation in all desired activities (may need albuterol  before activity) * Albuterol  use two time or less a week on average (not counting use with activity) * Cough interfering with sleep two time or less a month * Oral steroids no more than once a year * No hospitalizations     2. Chronic non-allergic rhinitis - with overlying sinusitis - Continue with Zyrtec 10mg  at night.   3. 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  - I am so glad that you have been so well.   4. Return in about 4 months (around 04/26/2024). You can have the follow up appointment with Dr. Iva or a Nurse Practicioner (our Nurse Practitioners are excellent and always have Physician oversight!).    Please inform us  of any Emergency Department visits, hospitalizations, or changes in symptoms. Call us  before going to the ED for breathing or allergy symptoms since we might be able to fit you in for a sick visit. Feel free to contact us  anytime with any questions, problems, or concerns.  It was a pleasure to see you again today!  Websites that have reliable patient information: 1. American Academy of Asthma, Allergy, and Immunology: www.aaaai.org 2. Food Allergy Research and Education (FARE): foodallergy.org 3. Mothers of Asthmatics: http://www.asthmacommunitynetwork.org 4. American College of Allergy, Asthma, and Immunology:  www.acaai.org      "Like" us  on Facebook and Instagram for our latest updates!      A healthy democracy works best when [employer] participate! Make sure you are registered to vote! If you have moved or changed any of your contact information, you will need to get this updated before voting! Scan the QR codes below to learn more!

## 2023-12-30 ENCOUNTER — Ambulatory Visit: Payer: Self-pay | Admitting: Allergy & Immunology

## 2023-12-31 DIAGNOSIS — H35371 Puckering of macula, right eye: Secondary | ICD-10-CM | POA: Diagnosis not present

## 2023-12-31 DIAGNOSIS — H524 Presbyopia: Secondary | ICD-10-CM | POA: Diagnosis not present

## 2023-12-31 DIAGNOSIS — E119 Type 2 diabetes mellitus without complications: Secondary | ICD-10-CM | POA: Diagnosis not present

## 2023-12-31 DIAGNOSIS — H43813 Vitreous degeneration, bilateral: Secondary | ICD-10-CM | POA: Diagnosis not present

## 2023-12-31 DIAGNOSIS — Z961 Presence of intraocular lens: Secondary | ICD-10-CM | POA: Diagnosis not present

## 2023-12-31 DIAGNOSIS — H5203 Hypermetropia, bilateral: Secondary | ICD-10-CM | POA: Diagnosis not present

## 2023-12-31 DIAGNOSIS — H52223 Regular astigmatism, bilateral: Secondary | ICD-10-CM | POA: Diagnosis not present

## 2023-12-31 NOTE — Telephone Encounter (Signed)
 Noted will reach out to pharmacy to advise same to D/C

## 2024-01-02 LAB — CBC WITH DIFF/PLATELET
Basophils Absolute: 0 x10E3/uL (ref 0.0–0.2)
Basos: 0 %
EOS (ABSOLUTE): 0 x10E3/uL (ref 0.0–0.4)
Eos: 0 %
Hematocrit: 43.1 % (ref 34.0–46.6)
Hemoglobin: 14.2 g/dL (ref 11.1–15.9)
Immature Grans (Abs): 0 x10E3/uL (ref 0.0–0.1)
Immature Granulocytes: 0 %
Lymphocytes Absolute: 1 x10E3/uL (ref 0.7–3.1)
Lymphs: 25 %
MCH: 32.1 pg (ref 26.6–33.0)
MCHC: 32.9 g/dL (ref 31.5–35.7)
MCV: 98 fL — ABNORMAL HIGH (ref 79–97)
Monocytes Absolute: 0.3 x10E3/uL (ref 0.1–0.9)
Monocytes: 7 %
Neutrophils Absolute: 2.7 x10E3/uL (ref 1.4–7.0)
Neutrophils: 68 %
Platelets: 174 x10E3/uL (ref 150–450)
RBC: 4.42 x10E6/uL (ref 3.77–5.28)
RDW: 11.8 % (ref 11.7–15.4)
WBC: 4 x10E3/uL (ref 3.4–10.8)

## 2024-01-02 LAB — STREP PNEUMONIAE 23 SEROTYPES IGG
Pneumo Ab Type 1*: <0.1 ug/mL — AB (ref >1.3)
Pneumo Ab Type 12 (12F)*: <0.1 ug/mL — AB (ref >1.3)
Pneumo Ab Type 14*: 0.4 ug/mL — AB (ref >1.3)
Pneumo Ab Type 17 (17F)*: <0.1 ug/mL — AB (ref >1.3)
Pneumo Ab Type 19 (19F)*: <0.1 ug/mL — AB (ref >1.3)
Pneumo Ab Type 2*: <0.2 ug/mL — AB (ref >1.3)
Pneumo Ab Type 20*: <0.2 ug/mL — AB (ref >1.3)
Pneumo Ab Type 22 (22F)*: <0.1 ug/mL — AB (ref >1.3)
Pneumo Ab Type 23 (23F)*: <0.1 ug/mL — AB (ref >1.3)
Pneumo Ab Type 26 (6B)*: <0.1 ug/mL — AB (ref >1.3)
Pneumo Ab Type 3*: <0.1 ug/mL — AB (ref >1.3)
Pneumo Ab Type 34 (10A)*: 0.3 ug/mL — AB (ref >1.3)
Pneumo Ab Type 4*: <0.1 ug/mL — AB (ref >1.3)
Pneumo Ab Type 43 (11A)*: <0.1 ug/mL — AB (ref >1.3)
Pneumo Ab Type 5*: <0.1 ug/mL — AB (ref >1.3)
Pneumo Ab Type 51 (7F)*: <0.1 ug/mL — AB (ref >1.3)
Pneumo Ab Type 54 (15B)*: <0.2 ug/mL — AB (ref >1.3)
Pneumo Ab Type 56 (18C)*: 0.5 ug/mL — AB (ref >1.3)
Pneumo Ab Type 57 (19A)*: 0.5 ug/mL — AB (ref >1.3)
Pneumo Ab Type 68 (9V)*: 0.1 ug/mL — AB (ref >1.3)
Pneumo Ab Type 70 (33F)*: 0.4 ug/mL — AB (ref >1.3)
Pneumo Ab Type 8*: <0.3 ug/mL — AB (ref >1.3)
Pneumo Ab Type 9 (9N)*: <0.1 ug/mL — AB (ref >1.3)

## 2024-01-02 LAB — IGG 1, 2, 3, AND 4
IgG (Immunoglobin G), Serum: 530 mg/dL — ABNORMAL LOW (ref 586–1602)
IgG, Subclass 1: 300 mg/dL (ref 248–810)
IgG, Subclass 2: 121 mg/dL — ABNORMAL LOW (ref 130–555)
IgG, Subclass 3: 24 mg/dL (ref 15–102)
IgG, Subclass 4: 3 mg/dL (ref 2–96)

## 2024-01-02 LAB — IGG, IGA, IGM
IgA/Immunoglobulin A, Serum: 81 mg/dL — ABNORMAL LOW (ref 87–352)
IgM (Immunoglobulin M), Srm: 56 mg/dL (ref 26–217)

## 2024-01-02 LAB — DIPHTHERIA / TETANUS ANTIBODY PANEL
Diphtheria Ab: 0.16 [IU]/mL (ref <0.10)
Tetanus Ab, IgG: 1.68 [IU]/mL (ref <0.10)

## 2024-01-02 LAB — COMPLEMENT, TOTAL: Compl, Total (CH50): >60 U/mL (ref >41)

## 2024-01-05 ENCOUNTER — Ambulatory Visit (INDEPENDENT_AMBULATORY_CARE_PROVIDER_SITE_OTHER)

## 2024-01-05 DIAGNOSIS — J4550 Severe persistent asthma, uncomplicated: Secondary | ICD-10-CM | POA: Diagnosis not present

## 2024-01-13 ENCOUNTER — Other Ambulatory Visit: Payer: Self-pay | Admitting: Internal Medicine

## 2024-01-13 MED ORDER — BISOPROLOL FUMARATE 10 MG PO TABS
ORAL_TABLET | ORAL | 11 refills | Status: AC
Start: 2024-01-13 — End: ?

## 2024-01-13 NOTE — Telephone Encounter (Signed)
 FYI Only or Action Required?: Action required by provider: medication refill request.  Patient is followed in Pulmonology for Asthma, last seen on 11/24/2023 by Darlean Ozell NOVAK, MD.

## 2024-01-13 NOTE — Telephone Encounter (Signed)
 Copied from CRM #[id number]. Topic: Clinical - Medication Refill >> Jan 13, 2024 12:30 PM Abigail D wrote: Medication: bisoprolol  (ZEBETA ) 10 MG tablet [[id number]]  Has the patient contacted their pharmacy? Yes - Pharmacy request (Agent: If no, request that the patient contact the pharmacy for the refill. If patient does not wish to contact the pharmacy document the reason why and proceed with request.) (Agent: If yes, when and what did the pharmacy advise?)  This is the patient's preferred pharmacy:  [facility] - [place], [place] - [address] [place] [address] Phone: [phone] Fax: [phone]  Is this the correct pharmacy for this prescription? Yes If no, delete pharmacy and type the correct one.   Has the prescription been filled recently? No  Is the patient out of the medication? Yes/Pharm unsure  Has the patient been seen for an appointment in the last year OR does the patient have an upcoming appointment? Yes  Can we respond through MyChart? No  Agent: Please be advised that Rx refills may take up to 3 business days. We ask that you follow-up with your pharmacy.

## 2024-01-24 ENCOUNTER — Other Ambulatory Visit: Payer: Self-pay | Admitting: Allergy & Immunology

## 2024-02-05 DIAGNOSIS — M1380 Other specified arthritis, unspecified site: Secondary | ICD-10-CM | POA: Diagnosis not present

## 2024-02-18 ENCOUNTER — Encounter: Payer: Self-pay | Admitting: *Deleted

## 2024-02-18 NOTE — Progress Notes (Signed)
 Kathy Tran                                          MRN: [id number]   02/18/2024   The [facility] Quality Team Specialist reviewed this patient medical record for the purposes of chart review for care gap closure. The following were reviewed: chart review for care gap closure-kidney health evaluation for diabetes:eGFR  and uACR.    [facility]I Quality Team

## 2024-03-01 ENCOUNTER — Ambulatory Visit

## 2024-03-01 DIAGNOSIS — J4550 Severe persistent asthma, uncomplicated: Secondary | ICD-10-CM | POA: Diagnosis not present

## 2024-03-05 DIAGNOSIS — Z6829 Body mass index (BMI) 29.0-29.9, adult: Secondary | ICD-10-CM | POA: Diagnosis not present

## 2024-03-05 DIAGNOSIS — E663 Overweight: Secondary | ICD-10-CM | POA: Diagnosis not present

## 2024-03-05 DIAGNOSIS — M79642 Pain in left hand: Secondary | ICD-10-CM | POA: Diagnosis not present

## 2024-03-05 DIAGNOSIS — R5382 Chronic fatigue, unspecified: Secondary | ICD-10-CM | POA: Diagnosis not present

## 2024-03-05 DIAGNOSIS — M79641 Pain in right hand: Secondary | ICD-10-CM | POA: Diagnosis not present

## 2024-03-05 DIAGNOSIS — M7989 Other specified soft tissue disorders: Secondary | ICD-10-CM | POA: Diagnosis not present

## 2024-03-22 DIAGNOSIS — Z6829 Body mass index (BMI) 29.0-29.9, adult: Secondary | ICD-10-CM | POA: Diagnosis not present

## 2024-03-22 DIAGNOSIS — M79641 Pain in right hand: Secondary | ICD-10-CM | POA: Diagnosis not present

## 2024-03-22 DIAGNOSIS — B370 Candidal stomatitis: Secondary | ICD-10-CM | POA: Diagnosis not present

## 2024-03-22 DIAGNOSIS — R5382 Chronic fatigue, unspecified: Secondary | ICD-10-CM | POA: Diagnosis not present

## 2024-03-22 DIAGNOSIS — M0609 Rheumatoid arthritis without rheumatoid factor, multiple sites: Secondary | ICD-10-CM | POA: Diagnosis not present

## 2024-03-22 DIAGNOSIS — E663 Overweight: Secondary | ICD-10-CM | POA: Diagnosis not present

## 2024-03-22 DIAGNOSIS — M7989 Other specified soft tissue disorders: Secondary | ICD-10-CM | POA: Diagnosis not present

## 2024-03-22 DIAGNOSIS — M79642 Pain in left hand: Secondary | ICD-10-CM | POA: Diagnosis not present

## 2024-04-14 ENCOUNTER — Other Ambulatory Visit: Payer: Self-pay | Admitting: *Deleted

## 2024-04-14 MED ORDER — BREZTRI AEROSPHERE 160-9-4.8 MCG/ACT IN AERO: INHALATION_SPRAY | RESPIRATORY_TRACT | 3 refills | Status: AC

## 2024-04-14 MED ORDER — FASENRA 30 MG/ML ~~LOC~~ SOSY: 30.0000 mg | PREFILLED_SYRINGE | SUBCUTANEOUS | 6 refills | Status: AC

## 2024-04-15 ENCOUNTER — Other Ambulatory Visit (HOSPITAL_COMMUNITY): Payer: Self-pay | Admitting: Internal Medicine

## 2024-04-15 DIAGNOSIS — Z1231 Encounter for screening mammogram for malignant neoplasm of breast: Secondary | ICD-10-CM

## 2024-04-15 DIAGNOSIS — E118 Type 2 diabetes mellitus with unspecified complications: Secondary | ICD-10-CM | POA: Diagnosis not present

## 2024-04-15 DIAGNOSIS — K219 Gastro-esophageal reflux disease without esophagitis: Secondary | ICD-10-CM | POA: Diagnosis not present

## 2024-04-15 DIAGNOSIS — M1990 Unspecified osteoarthritis, unspecified site: Secondary | ICD-10-CM | POA: Diagnosis not present

## 2024-04-15 DIAGNOSIS — Z79899 Other long term (current) drug therapy: Secondary | ICD-10-CM | POA: Diagnosis not present

## 2024-04-24 ENCOUNTER — Telehealth

## 2024-04-26 ENCOUNTER — Ambulatory Visit

## 2024-04-30 ENCOUNTER — Ambulatory Visit (INDEPENDENT_AMBULATORY_CARE_PROVIDER_SITE_OTHER)

## 2024-04-30 ENCOUNTER — Ambulatory Visit (INDEPENDENT_AMBULATORY_CARE_PROVIDER_SITE_OTHER): Admitting: Allergy & Immunology

## 2024-04-30 VITALS — BP 130/66 | HR 75 | Temp 98.0°F

## 2024-04-30 DIAGNOSIS — E1169 Type 2 diabetes mellitus with other specified complication: Secondary | ICD-10-CM | POA: Diagnosis not present

## 2024-04-30 DIAGNOSIS — J310 Chronic rhinitis: Secondary | ICD-10-CM

## 2024-04-30 DIAGNOSIS — J4550 Severe persistent asthma, uncomplicated: Secondary | ICD-10-CM | POA: Diagnosis not present

## 2024-04-30 DIAGNOSIS — Z794 Long term (current) use of insulin: Secondary | ICD-10-CM | POA: Diagnosis not present

## 2024-04-30 DIAGNOSIS — K219 Gastro-esophageal reflux disease without esophagitis: Secondary | ICD-10-CM

## 2024-04-30 DIAGNOSIS — D806 Antibody deficiency with near-normal immunoglobulins or with hyperimmunoglobulinemia: Secondary | ICD-10-CM | POA: Diagnosis not present

## 2024-04-30 MED ORDER — IPRATROPIUM-ALBUTEROL 0.5-2.5 (3) MG/3ML IN SOLN: 3.0000 mL | RESPIRATORY_TRACT | 2 refills | Status: DC | PRN

## 2024-04-30 NOTE — Progress Notes (Unsigned)
 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  - I think you are headed in the right direction. - Definitely complete the doxycycline  course.  - Daily controller medication(s): Breztri  two puffs one to two times daily + Fasenra  every 8 weeks - Rescue medications: albuterol  4 puffs every 4-6 hours as needed or DuoNeb nebulizer one vial every 4-6 hours as needed  - Changes during respiratory flares: Add on Pulmicort  (budesonide ) 0.5 mg twice daily via nebulizer for 1-2 weeks - Asthma control goals:  * Full participation in all desired activities (may need albuterol  before activity) * Albuterol  use two time or less a week on average (not counting use with activity) * Cough interfering with sleep two time or less a month * Oral steroids no more than once a year * No hospitalizations     2. Chronic non-allergic rhinitis - with overlying sinusitis - Continue with Zyrtec 10mg  at night.   3. 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 - I will have a low threshold to restart the immunoglobulin if you continue to have breakthrough infections.  - I am so glad that you have been so well.   4. Return in about 3 months (around 07/29/2024). You can have the follow up appointment with Dr. Iva or a Nurse Practicioner (our Nurse Practitioners are excellent and always have Physician oversight!).   Subjective:   Kathy Tran is a 69 y.o. 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  Discussed the use of AI scribe software for clinical note transcription with the patient and/or guardian, who gave verbal consent to proceed.  Kathy Tran is a 68 y.o. female presenting for a follow up visit.  She was last seen in August 2025.  At that time, she was doing great on Fasenra .  Does continue with Breztri  2 puffs 1-2 times daily.  For her rhinitis, we continue her Zyrtec.  For her hypogammaglobulinemia, she was doing well without her immunoglobulin replacement.  Her last infusion of been in March 2025.  Since  last visit, she has mostly done well.   Asthma/Respiratory Symptom History: She did have a cold and sneezing and scratchy throat coming on. She saw her PCP. Everything looked fine and she felt fine. Then on Saturday 6 days ago, she woke up with wheezing. Monday she started doxycycline . She started this and is starting to feel better. She is having a productive cough. It is a rattling sound however.  She began experiencing respiratory symptoms approximately ten days ago, following a regular physical exam. Initially, she developed a cold with a sore throat and sneezing, which progressed to wheezing by last Saturday. She was prescribed doxycycline , which she started on Monday, and noted improvement by yesterday. Nebulizer treatments have been helpful for wheezing, and she has not needed her rescue inhaler recently.  She feels that her breathing is under fairly good control.  Allergic Rhinitis Symptom History: Her allergic rhinitis is controlled with Zyrtec daily.  This seems to be working well to control her symptoms.  Infection Symptom History: This is the worst that she has felt since she stopped her immunoglobulin. She has a history of hypogammaglobulinemia and stopped her immunoglobulin infusions in March. Her IgG levels have been low since stopping, but she has not had frequent infections. She mentions a history of being frequently sick, earning her the nickname '[employer]s' during her time in the [employer].   Recent lab results showed low streptococcal pneumonia titers and low IgG levels. She has not had significant infections since stopping immunoglobulin therapy.   She has been exposed to illnesses through her family, particularly children who have been in school. She monitors her blood pressure and oxygen levels at home, noting her oxygen has not dropped below 94%, even during past severe illnesses like COVID-19 double pneumonia.  She has a history of rheumatoid arthritis, diagnosed after noticing swelling in her hands and elbows while mowing grass. She has been on prednisone  since September, which caused weight gain, and is currently on methotrexate injections weekly. She also takes folic acid. Prednisone  did not help significantly until she received a steroid injection.  Otherwise, there have been no changes to her past medical history, surgical history, family history, or social  history.    Review of systems otherwise negative other than that mentioned in the HPI.    Objective:   Blood pressure 130/66, pulse 75, temperature 98 F (36.7 C), SpO2 96%. There is no height or weight on file to calculate BMI.    Physical Exam Vitals reviewed.  Constitutional:      Appearance: She is well-developed. She is not ill-appearing, toxic-appearing or diaphoretic.     Comments: Very bubbly  HENT:     Head: Normocephalic and atraumatic.     Right Ear: Ear canal and external ear normal.     Left Ear: Ear canal and external ear normal.     Nose: No nasal deformity, septal deviation, mucosal edema or rhinorrhea.     Right Turbinates: Enlarged, swollen and pale.     Left Turbinates: Enlarged, swollen and pale.     Right Sinus: No maxillary sinus tenderness or frontal sinus tenderness.     Left Sinus: No maxillary sinus tenderness or frontal sinus tenderness.     Comments: No nasal polyps.    Mouth/Throat:     Lips: Pink.     Mouth: Mucous membranes are moist. Mucous membranes are not pale and not dry.     Pharynx: Uvula midline.     Comments: She does have what  appears to be lesions on her hard palate.  She says that these been there for a while and they come and go.  She denies any dysphagia as well as fevers.  She has been around her grandchildren, but they have not been sick at all. Eyes:     General: Lids are normal. Allergic shiner present.        Right eye: No discharge.        Left eye: No discharge.     Conjunctiva/sclera: Conjunctivae normal.     Right eye: Right conjunctiva is not injected. No chemosis.    Left eye: Left conjunctiva is not injected. No chemosis.    Pupils: Pupils are equal, round, and reactive to light.  Cardiovascular:     Rate and Rhythm: Normal rate and regular rhythm.     Heart sounds: Normal heart sounds.  Pulmonary:     Effort: Pulmonary effort is normal. No tachypnea, accessory muscle usage or respiratory distress.     Breath  sounds: Normal breath sounds. No wheezing, rhonchi or rales.     Comments: Moving air well in all lung fields. No increased work of breathing noted.  Chest:     Chest wall: No tenderness.  Lymphadenopathy:     Cervical: No cervical adenopathy.  Skin:    General: Skin is warm.     Capillary Refill: Capillary refill takes less than 2 seconds.     Coloration: Skin is not pale.     Findings: No abrasion, erythema, petechiae or rash. Rash is not papular, urticarial or vesicular.     Comments: No eczematous or urticarial lesions noted.  Neurological:     Mental Status: She is alert.  Psychiatric:        Behavior: Behavior is cooperative.      Diagnostic studies: none (deferred spirometry since she was feeling poorly)       Marty Shaggy, MD  [facility]        "

## 2024-04-30 NOTE — Patient Instructions (Addendum)
 1. 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  - I think you are headed in the right direction. - Definitely complete the doxycycline  course.  - Daily controller medication(s): Breztri  two puffs one to two times daily + Fasenra  every 8 weeks - Rescue medications: albuterol  4 puffs every 4-6 hours as needed or DuoNeb nebulizer one vial every 4-6 hours as needed  - Changes during respiratory flares: Add on Pulmicort  (budesonide ) 0.5 mg twice daily via nebulizer for 1-2 weeks - Asthma control goals:  * Full participation in all desired activities (may need albuterol  before activity) * Albuterol  use two time or less a week on average (not counting use with activity) * Cough interfering with sleep two time or less a month * Oral steroids no more than once a year * No hospitalizations     2. Chronic non-allergic rhinitis - with overlying sinusitis - Continue with Zyrtec 10mg  at night.   3. 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 - I will have a low threshold to restart the immunoglobulin if you continue to have breakthrough infections.  - I am so glad that you have been so well.    Component     Latest Ref Rng 12/26/2023  IgG (Immunoglobin G), Serum     586 - 1,602 mg/dL 469 (L)   IgG, Subclass 1     248 - 810 mg/dL 699   IgG, Subclass 2     130 - 555 mg/dL 878 (L)   IgG, Subclass 3     15 - 102 mg/dL 24   IgG, Subclass 4     2 - 96 mg/dL 3      Component     Latest Ref Rng 12/26/2023  Pneumo Ab Type 1*     >1.3 ug/mL <0.1 (L)   Pneumo Ab Type 3*     >1.3 ug/mL <0.1 (L)   Pneumo Ab Type 4*     >1.3 ug/mL <0.1 (L)   Pneumo Ab Type 8*     >1.3 ug/mL <0.3 (L)   Pneumo Ab Type 9 (9N)*     >1.3 ug/mL <0.1 (L)   Pneumo Ab Type 12 (106F)*     >1.3 ug/mL <0.1 (L)   Pneumo Ab Type 14*     >1.3 ug/mL 0.4 (L)    Pneumo Ab Type 17 (75F)*     >1.3 ug/mL <0.1 (L)   Pneumo Ab Type 19 (69F)*     >1.3 ug/mL <0.1 (L)   Pneumo Ab Type 2*     >1.3 ug/mL <0.2 (L)   Pneumo Ab Type 20*     >1.3 ug/mL <0.2 (L)   Pneumo Ab Type 22 (15F)*     >1.3 ug/mL <0.1 (L)   Pneumo Ab Type 23 (55F)*     >1.3 ug/mL <0.1 (L)   Pneumo Ab Type 26 (6B)*     >1.3 ug/mL <0.1 (L)   Pneumo Ab Type 34 (10A)*     >1.3 ug/mL 0.3 (L)   Pneumo Ab Type 43 (11A)*     >1.3 ug/mL <0.1 (L)   Pneumo Ab Type 5*     >1.3 ug/mL <0.1 (L)   Pneumo Ab Type 51 (11F)*     >1.3 ug/mL <0.1 (L)   Pneumo Ab Type 54 (15B)*     >1.3 ug/mL <0.2 (L)   Pneumo  Ab Type 56 (18C)*     >1.3 ug/mL 0.5 (L)   Pneumo Ab Type 57 (19A)*     >1.3 ug/mL 0.5 (L)   Pneumo Ab Type 68 (9V)*     >1.3 ug/mL 0.1 (L)   Pneumo Ab Type 70 (23F)*     >1.3 ug/mL 0.4 (L)     4. Return in about 3 months (around 07/29/2024). You can have the follow up appointment with Dr. Iva or a Nurse Practicioner (our Nurse Practitioners are excellent and always have Physician oversight!).    Please inform us  of any Emergency Department visits, hospitalizations, or changes in symptoms. Call us  before going to the ED for breathing or allergy symptoms since we might be able to fit you in for a sick visit. Feel free to contact us  anytime with any questions, problems, or concerns.  It was a pleasure to see you again today!  Websites that have reliable patient information: 1. American Academy of Asthma, Allergy, and Immunology: www.aaaai.org 2. Food Allergy Research and Education (FARE): foodallergy.org 3. Mothers of Asthmatics: http://www.asthmacommunitynetwork.org 4. American College of Allergy, Asthma, and Immunology: www.acaai.org      Like us  on [employer] and Instagram for our latest updates!      A healthy democracy works best when [employer] participate! Make sure you are registered to vote! If you have moved or changed any of your contact information, you will  need to get this updated before voting! Scan the QR codes below to learn more!

## 2024-05-04 ENCOUNTER — Encounter: Payer: Self-pay | Admitting: Allergy & Immunology

## 2024-05-11 ENCOUNTER — Telehealth: Payer: Self-pay | Admitting: Allergy & Immunology

## 2024-05-11 DIAGNOSIS — D839 Common variable immunodeficiency, unspecified: Secondary | ICD-10-CM

## 2024-05-11 NOTE — Telephone Encounter (Signed)
 Pt called stating that she may need a stronger antibiotic. No fever, her temp is low 95.8. She's stating that she is experiencing that her are ears hurting, teeth are burning and sore around the cheek bone as well as a sore throat. She wasn't feeling this way on 19th on her office visit. She states that she is willing to restart the IGG infusions whenever you want her to. She doesn't want to continue feeling this way much longer.

## 2024-05-13 MED ORDER — AMOXICILLIN-POT CLAVULANATE 875-125 MG PO TABS: 1.0000 | ORAL_TABLET | Freq: Two times a day (BID) | ORAL | 0 refills | Status: AC

## 2024-05-13 NOTE — Telephone Encounter (Signed)
 I called her to check on her and she was started on azithromycin  by her PCP when she did not hear from us . I apologized and explained that I was off that day and just now saw the message.   She is not sure whether the azithromycin  is helping at all. We both agreed to send in Augmentin  for two weeks to increase the spectrum of her coverage.  She is also very interested in restarting the immunoglobulin again. We had discussed doing it during the winter months only (since her summer was so good without the IVIG), but she wanted to hold off. She has officially changed her mind.  She would like to go to the [facility] if possible. Her last two home health nurses were not great with finding veins and she feels that they would do better in an infusion center.   Can we do that, Tammy?   Marty Shaggy, MD [facility] 

## 2024-05-16 ENCOUNTER — Encounter: Payer: Self-pay | Admitting: Allergy & Immunology

## 2024-05-17 NOTE — Telephone Encounter (Signed)
 Patient called back stating she was prescribed antibiotics and is still not feeling any better.

## 2024-05-18 ENCOUNTER — Ambulatory Visit

## 2024-05-18 ENCOUNTER — Ambulatory Visit
Admission: EM | Admit: 2024-05-18 | Discharge: 2024-05-18 | Disposition: A | Discharge status: Home / Self Care | Attending: Family Medicine | Admitting: Family Medicine

## 2024-05-18 DIAGNOSIS — R051 Acute cough: Secondary | ICD-10-CM | POA: Diagnosis not present

## 2024-05-18 DIAGNOSIS — J4551 Severe persistent asthma with (acute) exacerbation: Secondary | ICD-10-CM | POA: Diagnosis not present

## 2024-05-18 DIAGNOSIS — H6993 Unspecified Eustachian tube disorder, bilateral: Secondary | ICD-10-CM | POA: Diagnosis not present

## 2024-05-18 DIAGNOSIS — D839 Common variable immunodeficiency, unspecified: Secondary | ICD-10-CM | POA: Insufficient documentation

## 2024-05-18 HISTORY — DX: Rheumatoid arthritis, unspecified: M06.9

## 2024-05-18 MED ORDER — DEXAMETHASONE SOD PHOSPHATE PF 10 MG/ML IJ SOLN
10.0000 mg | Freq: Once | INTRAMUSCULAR | Status: AC
  Administered 2024-05-18: 10 mg via INTRAMUSCULAR

## 2024-05-18 NOTE — ED Provider Notes (Signed)
 " [facility]    CSN: [id number] Arrival date & time: 05/18/24  1030      History   Chief Complaint No chief complaint on file.   HPI Kathy Tran is a 69 y.o. female.   Patient presenting today with about a month of ongoing shortness of breath, wheezing, chest tightness, productive cough, bilateral ear pressure, sinus pressure.  Denies fever, chest pain, abdominal pain, vomiting, diarrhea.  History of COPD and severe asthma followed by pulmonology, on Fasenra , Pulmicort  nebs, Breztri , albuterol  as needed.  She was given a course of doxycycline  by primary care early on and symptoms, no relief with this so was switched to Zithromax .  No relief with this so pulmonologist put her on Augmentin  less than a week ago but still no relief.  She notes she takes her allergy regimen faithfully but does not do well with nasal sprays so has been applying some of the nasal spray to a Q-tip and sniffing the Q-tip rather than spraying up nostril.    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   DOE (dyspnea on exertion) 08/14/2023   Essential hypertension 08/14/2023   Obesity, morbid (HCC) 05/23/2023   Papular urticaria 12/31/2021   S/P total right hip arthroplasty 07/17/2021   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/27/2018   Hx of colonic polyps 01/07/2018    Postmenopausal 07/02/2017   Hypogammaglobulinemia 04/29/2017   Recurrent infections 04/29/2017   Non-allergic rhinitis 04/29/2017   Severe persistent asthma with (acute) exacerbation (HCC) 04/29/2017   Essential hypertension, benign 03/20/2017   Asthma exacerbation 12/26/2016   Asthmatic bronchitis 07/18/2016   DM type 2 causing vascular disease (HCC) 07/18/2016   GERD (gastroesophageal reflux disease) 07/18/2016   Mixed hyperlipidemia 07/18/2016   History of colonic polyps 06/12/2016    Past Surgical History:  Procedure Laterality Date   ADENOIDECTOMY     BIOPSY  04/01/2018   Procedure: BIOPSY;  Surgeon: Golda Claudis PENNER, MD;  [facility];  Service: Endoscopy;;   CARPOMETACARPEL SUSPENSION PLASTY Right 04/13/2019   Procedure: CARPOMETACARPEL [facility]) SUSPENSION PLASTY RIGHT THUMB;  Surgeon: Murrell Kuba, MD;  Location: [facility];  Service: Orthopedics;  Laterality: Right;  AXILLARY BLOCK   CATARACT EXTRACTION W/PHACO Left 01/19/2018   Procedure: CATARACT EXTRACTION PHACO AND INTRAOCULAR LENS PLACEMENT LEFT EYE;  Surgeon: Perley Hamilton, MD;  Location: [facility];  Service: Ophthalmology;  Laterality: Left;  CDE: 7.52   CATARACT EXTRACTION W/PHACO Right 02/23/2018   Procedure: CATARACT EXTRACTION PHACO AND INTRAOCULAR LENS PLACEMENT (IOC);  Surgeon: Perley Hamilton, MD;  Location: [facility];  Service: Ophthalmology;  Laterality: Right;  CDE: 5.47   CESAREAN SECTION  1982, 1986   COLONOSCOPY  03/27/2011   Procedure: COLONOSCOPY;  Surgeon: Claudis RAYMOND Rivet, MD;  [facility]: [facility];  Service: Endoscopy;  Laterality: N/A;  2:00   COLONOSCOPY N/A 04/01/2018   Procedure: COLONOSCOPY;  Surgeon: Rivet Claudis RAYMOND, MD;  [facility]: [facility];  Service: Endoscopy;  Laterality: N/A;  1030   CYSTOSCOPY W/ URETERAL STENT PLACEMENT Left 11/08/2015   Procedure: CYSTOSCOPY, URETEROSCOPY, WITH STONE BASKET EXTRACTION;  Surgeon: Gretel Ferrara, MD;  Location: [facility];  Service: Urology;   Laterality: Left;   CYSTOSCOPY WITH URETEROSCOPY, STONE BASKETRY AND STENT PLACEMENT Right 05/12/2023   Procedure: CYSTOSCOPY WITH URETEROSCOPY, STONE BASKETRY AND STENT PLACEMENT,HOLMIUM LASER;  Surgeon: Watt Rush, MD;  Location: [facility];  Service: Urology;  Laterality: Right;   CYSTOSCOPY/RETROGRADE/URETEROSCOPY/STONE EXTRACTION WITH BASKET Right 07/03/2015   Procedure: CYSTOSCOPY/RETROGRADE/URETEROSCOPY/STONE EXTRACTION WITH BASKET;  Surgeon: Rush Watt, MD;  Location: [facility];  Service: Urology;  Laterality: Right;   ENDOMETRIAL ABLATION     FLEXIBLE BRONCHOSCOPY Bilateral 10/31/2016   Procedure: FLEXIBLE BRONCHOSCOPY WITH PROPOFOL ;  Surgeon: Vonzell Loving, MD;  [facility];  Service: Cardiopulmonary;  Laterality: Bilateral;   FLEXIBLE BRONCHOSCOPY N/A 01/17/2017   Procedure: FLEXIBLE BRONCHOSCOPY WITH PROPOFOL ;  Surgeon: Vonzell Loving, MD;  [facility];  Service: Cardiopulmonary;  Laterality: N/A;   KIDNEY STONE SURGERY     multiple   SHOULDER SURGERY     SINOSCOPY     TONSILLECTOMY     TOTAL HIP ARTHROPLASTY Right 07/17/2021   Procedure: RIGHT TOTAL HIP ARTHROPLASTY ANTERIOR APPROACH;  Surgeon: Beverley Evalene BIRCH, MD;  Location: [facility];  Service: Orthopedics;  Laterality: Right;   TUBAL LIGATION  1991   TYMPANOSTOMY TUBE PLACEMENT      OB History     Gravida  2   Para  2   Term  2   Preterm      AB      Living  2      SAB      IAB      Ectopic      Multiple      Live Births  2            Home Medications    Prior to Admission medications  Medication Sig Start Date End Date Taking? Authorizing Provider  Alcohol  Swabs  (B-D SINGLE USE SWABS  REGULAR) PADS  08/17/19   [provider]  amLODipine  (NORVASC ) 5 MG tablet TAKE 1 TABLET BY MOUTH AT BEDTIME 06/20/19   [provider]  amoxicillin -clavulanate (AUGMENTIN ) 875-125 MG tablet Take 1 tablet by mouth 2 (two) times daily for 14 days. 05/13/24 05/27/24  Iva Marty Saltness, MD  benralizumab  (FASENRA ) 30 MG/ML prefilled syringe Inject 1 mL (30 mg total) into the skin every 8 (eight) weeks. 04/14/24   Iva Marty Saltness, MD  bisoprolol  (ZEBETA ) 10 MG tablet One twice daily 01/13/24   Darlean Ozell NOVAK, MD  Blood Glucose Calibration (TRUE METRIX LEVEL 1) Low SOLN  08/13/19   [provider]  Blood Glucose Monitoring Suppl (TRUE METRIX AIR GLUCOSE METER) w/Device KIT  08/18/19   [provider]  budesonide  (PULMICORT ) 0.5 MG/2ML nebulizer solution Mix one vial with albuterol  nebulizer solution and use every 4 hours as needed for respiratory flares. 05/22/23   Iva Marty Saltness, MD  budesonide -glycopyrrolate -formoterol  (BREZTRI  AEROSPHERE) 160-9-4.8 MCG/ACT AERO inhaler Inhale 2 puffs into the lungs twice daily with spacer. 04/14/24   Iva Marty Saltness, MD  DROPLET PEN NEEDLES 32G X 4 MM MISC  08/13/19  [provider]  famotidine  (PEPCID ) 20 MG tablet Take 20 mg by mouth at bedtime.    [provider]  guaiFENesin  (MUCINEX ) 600 MG 12 hr tablet Take 1 tablet (600 mg total) by mouth 2 (two) times daily. 09/30/16   Sheryle Carwin, MD  hydrochlorothiazide  (HYDRODIURIL ) 25 MG tablet Take 25 mg by mouth daily. 07/26/20   [provider]  insulin  aspart (NOVOLOG  FLEXPEN) 100 UNIT/ML FlexPen Inject 2 Units into the skin 3 (three) times daily with meals. Per sliding scale    [provider]  ipratropium-albuterol  (DUONEB) 0.5-2.5 (3) MG/3ML SOLN Take 3 mLs by nebulization every 4 (four) hours as needed. 04/30/24   Iva Marty Saltness, MD  lansoprazole  (PREVACID ) 30 MG capsule Take 30 mg by mouth daily as needed.    [provider]  LANTUS  SOLOSTAR 100 UNIT/ML Solostar Pen Inject 40 Units into the skin in the morning. 09/03/20   [provider]  LORazepam  (ATIVAN ) 0.5 MG tablet Take 0.5 mg by mouth at bedtime. 03/05/17   [provider]  meloxicam (MOBIC) 15 MG tablet Take 15 mg by mouth in the morning.     [provider]  metFORMIN  (GLUCOPHAGE -XR) 500 MG 24 hr tablet Take 2,000 mg by mouth daily with breakfast. 04/17/15   [provider]  montelukast  (SINGULAIR ) 10 MG tablet TAKE 1 TABLET AT BEDTIME 08/28/23   Iva Marty Saltness, MD  pantoprazole  (PROTONIX ) 40 MG tablet Take 40 mg by mouth 2 (two) times daily. 07/16/23   [provider]  pravastatin  (PRAVACHOL ) 10 MG tablet Take 10 mg by mouth at bedtime.  06/18/15   [provider]  TRUE METRIX BLOOD GLUCOSE TEST test strip  08/17/19   [provider]  TRUEplus Lancets 33G MISC  08/17/19   [provider]  valsartan (DIOVAN) 320 MG tablet Take 320 mg by mouth at bedtime. 11/02/19   [provider]  VENTOLIN  HFA 108 (90 Base) MCG/ACT inhaler Inhale 2 puffs into the lungs every 4 (four) hours as needed for wheezing or shortness of breath. 12/26/23   Iva Marty Saltness, MD    Family History Family History  Problem Relation Age of Onset   COPD Mother    Cancer Father     Social History Social History[1]   Allergies   Sulfa antibiotics and Hydrocodone    Review of Systems Review of Systems PER HPI  Physical Exam Triage Vital Signs ED Triage Vitals  Encounter Vitals Group     BP 05/18/24 1038 (!) 154/74     Girls Systolic BP Percentile --      Girls Diastolic BP Percentile --      Boys Systolic BP Percentile --      Boys Diastolic BP Percentile --      Pulse Rate 05/18/24 1038 85     Resp 05/18/24 1038 18     Temp 05/18/24 1038 98.1 F (36.7 C)     Temp Source 05/18/24 1036 Oral     SpO2 05/18/24 1038 95 %     Weight --      Height --      Head Circumference --      Peak Flow --      Pain Score 05/18/24 1036 3     Pain Loc --      Pain Education --      Exclude from Growth Chart --    No data found.  Updated Vital Signs BP (!) 154/74 (BP Location: Right Arm)  Pulse 85   Temp 98.1 F (36.7 C) (Oral)   Resp 18   SpO2 95%   Visual Acuity Right Eye  Distance:   Left Eye Distance:   Bilateral Distance:    Right Eye Near:   Left Eye Near:    Bilateral Near:     Physical Exam Vitals and nursing note reviewed.  Constitutional:      Appearance: Normal appearance.  HENT:     Head: Atraumatic.     Right Ear: External ear normal.     Left Ear: External ear normal.     Ears:     Comments: Bilateral middle ear effusions    Nose: Rhinorrhea present.     Mouth/Throat:     Mouth: Mucous membranes are moist.     Pharynx: Posterior oropharyngeal erythema present.  Eyes:     Extraocular Movements: Extraocular movements intact.     Conjunctiva/sclera: Conjunctivae normal.  Cardiovascular:     Rate and Rhythm: Normal rate and regular rhythm.     Heart sounds: Normal heart sounds.  Pulmonary:     Effort: Pulmonary effort is normal.     Breath sounds: Wheezing present. No rales.  Musculoskeletal:        General: Normal range of motion.     Cervical back: Normal range of motion and neck supple.  Skin:    General: Skin is warm and dry.  Neurological:     Mental Status: She is alert and oriented to person, place, and time.  Psychiatric:        Mood and Affect: Mood normal.        Thought Content: Thought content normal.     UC Treatments / Results  Labs (all labs ordered are listed, but only abnormal results are displayed) Labs Reviewed - No data to display  EKG   Radiology DG Chest 2 View Result Date: 05/18/2024 EXAM: 2 VIEW(S) XRAY OF THE CHEST 05/18/2024 11:02:35 AM COMPARISON: 09/25/2020 chest x-ray, CT chest  09/25/16 CLINICAL HISTORY: 4 weeks cough, SOB FINDINGS: LUNGS AND PLEURA: No focal pulmonary opacity. No pleural effusion. No pneumothorax. HEART AND MEDIASTINUM: No acute abnormality of the cardiac and mediastinal silhouettes. BONES AND SOFT TISSUES: No acute osseous abnormality. IMPRESSION: 1. No acute cardiopulmonary process. Electronically signed by: Morgane Naveau MD 05/18/2024 11:51 AM EST RP Workstation: [id number]     Procedures Procedures (including critical care time)  Medications Ordered in UC Medications  dexamethasone  (DECADRON ) injection 10 mg (10 mg Intramuscular Given 05/18/24 1218)    Initial Impression / Assessment and Plan / UC Course  I have reviewed the triage vital signs and the nursing notes.  Pertinent labs & imaging results that were available during my care of the patient were reviewed by me and considered in my medical decision making (see chart for details).     Vitals and exam reassuring today, suspect asthma exacerbation and eustachian tube dysfunction.  Discussed IM Decadron , the importance of proper use of her nasal spray to help with her eustachian tube dysfunction, continue allergy regimen and over-the-counter cold and congestion medications.  Chest x-ray today showing no acute cardiopulmonary abnormality.  Return for worsening or unresolving symptoms.  Final Clinical Impressions(s) / UC Diagnoses   Final diagnoses:  Acute cough  ETD (Eustachian tube dysfunction), bilateral  Severe persistent asthma with acute exacerbation [facility])     Discharge Instructions      We have given you a steroid shot today.  In addition to your allergy and asthma medications,  start the Astelin  nasal spray twice daily in addition to sinus rinses, humidifiers.  Follow-up for worsening or unresolving symptoms.  Your chest x-ray did not show any pneumonia today but you may continue the Augmentin  prescribed by pulmonology.    ED Prescriptions   None    PDMP not reviewed this encounter.    [1]  Social History Tobacco Use   Smoking status: Never   Smokeless tobacco: Never  Vaping Use   Vaping status: Never Used  Substance Use Topics   Alcohol  use: Not Currently   Drug use: No     Stuart Vernell Norris, PA-C 05/18/24 1547  "

## 2024-05-18 NOTE — ED Triage Notes (Signed)
 Pt reports she has SHOB, cough, full of fluid, bilateral ear pain, and head fullness x 4 weeks     Currently on amoxicillin  denies relief  took neb tx

## 2024-05-18 NOTE — Discharge Instructions (Signed)
 We have given you a steroid shot today.  In addition to your allergy and asthma medications, start the Astelin  nasal spray twice daily in addition to sinus rinses, humidifiers.  Follow-up for worsening or unresolving symptoms.  Your chest x-ray did not show any pneumonia today but you may continue the Augmentin  prescribed by pulmonology.

## 2024-05-19 ENCOUNTER — Other Ambulatory Visit (HOSPITAL_COMMUNITY): Payer: Self-pay | Admitting: Allergy & Immunology

## 2024-05-19 NOTE — Telephone Encounter (Signed)
 Called patient and advised orders to Kathy Tran and they will work on getting her restarted on [employer]  and reach out to schedule her to restart

## 2024-05-20 NOTE — Telephone Encounter (Signed)
 I have sent order to [facility] and they are working on her restart

## 2024-05-21 ENCOUNTER — Telehealth: Payer: Self-pay

## 2024-05-21 NOTE — Telephone Encounter (Signed)
 Received fax from [employer]. Need Patient Signature for re-enrollment. I called the patient and left a message to call the office back. Form has been placed in the red accordion folder at the [place] office.

## 2024-05-21 NOTE — Telephone Encounter (Signed)
 Patient came in and signed Patient re-enrollment authorization form. It has been faxed to [employer] at [phone].

## 2024-05-25 ENCOUNTER — Encounter: Payer: Self-pay | Admitting: Internal Medicine

## 2024-05-25 ENCOUNTER — Ambulatory Visit: Admitting: Internal Medicine

## 2024-05-25 VITALS — BP 145/72 | HR 84 | Ht 62.0 in | Wt 164.0 lb

## 2024-05-25 DIAGNOSIS — R0609 Other forms of dyspnea: Secondary | ICD-10-CM

## 2024-05-25 DIAGNOSIS — J45998 Other asthma: Secondary | ICD-10-CM | POA: Diagnosis not present

## 2024-05-25 DIAGNOSIS — J310 Chronic rhinitis: Secondary | ICD-10-CM

## 2024-05-25 DIAGNOSIS — J45909 Unspecified asthma, uncomplicated: Secondary | ICD-10-CM

## 2024-05-25 MED ORDER — METHYLPREDNISOLONE ACETATE 80 MG/ML IJ SUSP
120.0000 mg | Freq: Once | INTRAMUSCULAR | Status: AC
  Administered 2024-05-25: 120 mg via INTRAMUSCULAR

## 2024-05-25 MED ORDER — ALBUTEROL SULFATE (2.5 MG/3ML) 0.083% IN NEBU: 2.5000 mg | INHALATION_SOLUTION | RESPIRATORY_TRACT | 12 refills | Status: AC | PRN

## 2024-05-25 NOTE — Progress Notes (Unsigned)
 "   Kathy Tran, female    DOB: March 03, 1956    MRN: [id number]   Brief patient profile:  14  yowf never smoker/MM  former [facility] pt self-referred back to pulmonary clinic in [place]  08/14/2023  for asthma   Symptom onset was in 2018 after an attack of the flu.  She reports an attack of bronchitis in 2016 Repeated ear infections as a child requiring multiple ear tubes through 1990s and 2000, sinus surgery 1998 Hospitalization 07/2016 for bronchospasm , remained on prednisone  for 3 months IgG  398  09/2016 , given IVIG Bronchoscopy 10/2016, 01/2017 for mucous plugs Allergy consultation 2018 >> maintained on IV IgG every 4 weeks - started 03/2017 , Nucala  every 4 weeks and Trelegy, prednisone  tapered 05/2017   [facility] consultation 07/2017 was reviewed >> felt that chronic prednisone  use caused hypogammaglobulinemia   However when IVIG was stopped for 3 months her symptoms returned and she has been maintained on IVIG infusions since. Repeat IgG testing showed normal levels of 794 09/2018 and 838 and 07/2018   01/2021 dc'd budesonide  due to frequent thrush  02/12/2023 spirometry: FVC 74, FEV1 75%, ratio 0.80    History of Present Illness  08/14/2023  Pulmonary/ 1st office eval/ Tocara Mennen / [facility] / last prednisone  2 weeks prior to OV   Cc swelling x 3 weeks developed on pred but slow to resolve, mostly face and upper chest Dyspnea:  [place] stopping up to 2 miles 1/4 mile  since early in march 2025   Cough: none  Sleep: 6 in blocks/ 2 pillow does fine  SABA use: hfa  last 4-5 days  prior to OV - -very rarely need neb  02 ldz:wnwz  Rec Stop corevidol and start bisoprolol  10 mg each am and extra dose in pm if needed for Systolic pressure over 130 or a pulse over 75  Can take extra dose of hydrodiuril  if needed in short run only  Please schedule a follow up office visit in 6 weeks, call sooner if needed with all medications /inhalers/ solutions in hand   -  08/14/23   Eos 0.0/ alpha one AT  phenotype MM   level 140     11/24/2023  f/u ov/[place] [facility]/Billey Wojciak re: AB  maint on breztri  2 in am  did  bring meds Dyspnea:  improved activity  Cough: none  Sleeping: 6 in bed blocks s resp cc  SABA use: just before exertion  02: none  Rec No change in medications unless breathing worse and if so increase the breztri  to Take 2 puffs first thing in am and then another 2 puffs about 12 hours later.  Please schedule a follow up visit in 6 months but call sooner if needed   PA and lat cxr  05/18/24 wnl   05/25/2024  f/u ov/ office/[facility] re: AB maint on Breztri  / sick Dec 9th neg viral eval   Chief Complaint  Patient presents with   Shortness of Breath    Pt has been sick but has been using neb and it has helped some but not better at all, had x ray at [facility] showed inflammation, Very congested   Dyspnea:  better  Sleeping: bed is 6 in blocks s  resp cc  SABA use: minimal  02: none       No obvious day to day or daytime variability or assoc excess/ purulent sputum or mucus plugs or hemoptysis or  cp or chest tightness, subjective wheeze or overt sinus or hb symptoms.    Also denies any obvious fluctuation of symptoms with weather or environmental changes or other aggravating or alleviating factors except as outlined above   No unusual exposure hx or h/o childhood pna/ asthma or knowledge of premature birth.  Current Allergies, Complete Past Medical History, Past Surgical History, Family History, and Social History were reviewed in [employer] record.  ROS  The following are not active complaints unless bolded Hoarseness, sore throat, dysphagia, dental problems, itching, sneezing,  nasal congestion or discharge of excess mucus or purulent secretions, ear ache,   fever, chills, sweats, unintended wt loss or wt gain, classically pleuritic or exertional cp,  orthopnea pnd or arm/hand swelling  or leg swelling,  presyncope, palpitations, abdominal pain, anorexia, nausea, vomiting, diarrhea  or change in bowel habits or change in bladder habits, change in stools or change in urine, dysuria, hematuria,  rash, arthralgias, visual complaints, headache, numbness, weakness or ataxia or problems with walking or coordination,  change in mood or  memory.         Outpatient Medications Prior to Visit  Medication Sig Dispense Refill   Alcohol  Swabs  (B-D SINGLE USE SWABS  REGULAR) PADS      amLODipine  (NORVASC ) 5 MG tablet TAKE 1 TABLET BY MOUTH AT BEDTIME     amoxicillin -clavulanate (AUGMENTIN ) 875-125 MG tablet Take 1 tablet by mouth 2 (two) times daily for 14 days. 28 tablet 0   benralizumab  (FASENRA ) 30 MG/ML prefilled syringe Inject 1 mL (30 mg total) into the skin every 8 (eight) weeks. 1 mL 6   bisoprolol  (ZEBETA ) 10 MG tablet One twice daily 60 tablet 11   Blood Glucose Calibration (TRUE METRIX LEVEL 1) Low SOLN      Blood Glucose Monitoring Suppl (TRUE METRIX AIR GLUCOSE METER) w/Device KIT      budesonide  (PULMICORT ) 0.5 MG/2ML nebulizer solution Mix one vial with albuterol  nebulizer solution and use every 4 hours as needed for respiratory flares. 120 mL 3   budesonide -glycopyrrolate -formoterol  (BREZTRI  AEROSPHERE) 160-9-4.8 MCG/ACT AERO inhaler Inhale 2 puffs into the lungs twice daily with spacer. 32.1 g 3   DROPLET PEN NEEDLES 32G X 4 MM MISC      famotidine  (PEPCID ) 20 MG tablet Take 20 mg by mouth at bedtime.     guaiFENesin  (MUCINEX ) 600 MG 12 hr tablet Take 1 tablet (600 mg total) by mouth 2 (two) times daily. 60 tablet 1   hydrochlorothiazide  (HYDRODIURIL ) 25 MG tablet Take 25 mg by mouth daily.     insulin  aspart (NOVOLOG  FLEXPEN) 100 UNIT/ML FlexPen Inject 2 Units into the skin 3 (three) times daily with meals. Per sliding scale     ipratropium-albuterol  (DUONEB) 0.5-2.5 (3) MG/3ML SOLN Take 3 mLs by nebulization every 4 (four) hours as needed. 360 mL 2   lansoprazole  (PREVACID ) 30 MG capsule  Take 30 mg by mouth daily as needed.     LANTUS  SOLOSTAR 100 UNIT/ML Solostar Pen Inject 40 Units into the skin in the morning.     LORazepam  (ATIVAN ) 0.5 MG tablet Take 0.5 mg by mouth at bedtime.     meloxicam (MOBIC) 15 MG tablet Take 15 mg by mouth in the morning.     metFORMIN  (GLUCOPHAGE -XR) 500 MG 24 hr tablet Take 2,000 mg by mouth daily with breakfast.     montelukast  (SINGULAIR ) 10 MG tablet TAKE 1 TABLET AT BEDTIME 90 tablet 3   pantoprazole  (PROTONIX ) 40 MG tablet Take  40 mg by mouth 2 (two) times daily.     pravastatin  (PRAVACHOL ) 10 MG tablet Take 10 mg by mouth at bedtime.      TRUE METRIX BLOOD GLUCOSE TEST test strip      TRUEplus Lancets 33G MISC      valsartan (DIOVAN) 320 MG tablet Take 320 mg by mouth at bedtime.     VENTOLIN  HFA 108 (90 Base) MCG/ACT inhaler Inhale 2 puffs into the lungs every 4 (four) hours as needed for wheezing or shortness of breath. 18 g 1   Facility-Administered Medications Prior to Visit  Medication Dose Route Frequency Provider Last Rate Last Admin   Benralizumab  SOSY 30 mg  30 mg Subcutaneous Q8 Weeks Gallagher, Joel Louis, MD   30 mg at 04/30/24 1621         Past Medical History:  Diagnosis Date   Angio-edema    Arthritis    Asthma    Flu march and May 2018   COPD (chronic obstructive pulmonary disease) (HCC)    Diabetes mellitus type 2, controlled (HCC)    x5 yrs   Eczema    GERD (gastroesophageal reflux disease)    Heart murmur    History of kidney stones    Hypertension    x 5 yrs   Hypogammaglobulinemia (HCC)    Pneumonia    Recurrent upper respiratory infection (URI)    Renal disorder    renal stones   Urticaria       Objective:    Wts  05/25/2024         *** 11/24/2023        158  08/15/23 162 lb 9.6 oz (73.8 kg)  08/14/23 163 lb (73.9 kg)  05/12/23 (!) 342 lb 9.5 oz (155.4 kg)    Vital signs reviewed  05/25/2024  - Note at rest 02 sats  ***% on ***   General appearance:    amb wf/slt nasal tone   Tedme  mod/sevre clear lugns              Assessment     "

## 2024-05-25 NOTE — Patient Instructions (Addendum)
 I emphasized that nasal steroids have no immediate benefit in terms of improving symptoms.  To help them reached the target tissue, the patient should use Afrin two puffs every 12 hours applied one min before using the nasal steroids.  Afrin should be stopped after no more than 5 days.  If the symptoms worsen, Afrin can be restarted after 5 days off of therapy to prevent rebound congestion from overuse of Afrin.  I also emphasized that in no way are nasal steroids a concern in terms of addiction.   Stop duoneb  Mix pulmocort with albuterol  nebulizer up to every 4 hours in thru your mouth and out thru nose   GERD (REFLUX)  is an extremely common cause of respiratory symptoms just like yours , many times with no obvious heartburn at all.    It can be treated with medication, but also with lifestyle changes including elevation of the head of your bed (ideally with 6 -8inch blocks under the headboard of your bed),  Smoking cessation, avoidance of late meals, excessive alcohol , and avoid fatty foods, chocolate, peppermint, colas, red wine, and acidic juices such as orange juice.  NO MINT OR MENTHOL  PRODUCTS SO NO COUGH DROPS  USE SUGARLESS CANDY INSTEAD (Jolley ranchers or Stover's or Life Savers) or even ice chips will also do - the key is to swallow to prevent all throat clearing. NO OIL BASED VITAMINS - use powdered substitutes.  Avoid fish oil when coughing.

## 2024-05-26 ENCOUNTER — Telehealth: Payer: Self-pay

## 2024-05-26 NOTE — Assessment & Plan Note (Addendum)
 Never smoker  - onset 2018 p viral uri assoc with hypogammaglobulinema - Rx  [place] 5 blockers since 03/2017 first nucala  then fasenra   - 08/14/2023  After extensive coaching inhaler device,  effectiveness =    75% (not able to do better s spacer which did not bring) > continue Breztri  - rec for  her action plan  for asthma can use alb/budesonide  0.25 mg up to q 4h prn instead of with duoneb   Despite a typical viral trigger clinically, she has perfectly clear lungs and mostly bothered by nasal/ ear congestion but no purulent secretions at all   >>> rec for plan C on her action plan as below: use alb/budesonide  0.25 mg up to q 4h prn just like Air Supra  (not with duoneb as that has sama in it she doesn't neeed and may be why she feels congested with perfectly clearly lungs

## 2024-05-26 NOTE — Telephone Encounter (Signed)
 Auth Submission: APPROVED Site of care: Site of care: [facility] Payer: [employer] Medication & CPT/J Code(s) submitted: [employer]  (IVIG) J1459 Diagnosis Code:  Route of submission (phone, fax, portal): portal Phone # Fax # Auth type: Buy/Bill PB Units/visits requested: 40g q4weeks Reference number: [id number] Approval from: 05/25/24 to 05/12/25

## 2024-05-26 NOTE — Assessment & Plan Note (Addendum)
 Flare assoc with over use of anticholinergics   >>> rec afrin / flonase x 5 days then resume flonase x at least 5 days before adding additional afrin pointing toward ear on same side .   >>> max rx for reflux which can cause rhinitis/ sinsusitis esp in setting of uri with cough in a cyclical fashion and she should remember the non-acid component needs to be suppressed as in her  instructions below.   F/ u q 6 m, sooner if needed       Each maintenance medication was reviewed in detail including emphasizing most importantly the difference between maintenance and prns and under what circumstances the prns are to be triggered using an action plan format where appropriate.  Total time for H and P, chart review, counseling, reviewing hfa/neb/ nasal device(s) and generating customized AVS unique to this office visit / same day charting = 35 min

## 2024-06-02 ENCOUNTER — Encounter: Attending: Allergy & Immunology | Admitting: *Deleted

## 2024-06-02 VITALS — BP 137/81 | HR 73 | Temp 97.3°F | Resp 16

## 2024-06-02 DIAGNOSIS — D839 Common variable immunodeficiency, unspecified: Secondary | ICD-10-CM

## 2024-06-02 MED ORDER — IMMUNE GLOBULIN (HUMAN) 5 GM/50ML IV SOLN
40.0000 g | Freq: Once | INTRAVENOUS | Status: AC
  Administered 2024-06-02: 40 g via INTRAVENOUS

## 2024-06-02 MED ORDER — DEXTROSE 5 % IV SOLN: INTRAVENOUS | Status: DC

## 2024-06-02 NOTE — Progress Notes (Signed)
 Diagnosis: Common Varied Immunodeficiency  Provider:  Marty Morton Shaggy, MD  Procedure: IV Infusion  IV Type: Peripheral, IV Location: R Antecubital  IVIG (Immune Globulin ), Dose: 40 g  Infusion Start Time: 1022  Infusion Stop Time: 1250  Post Infusion IV Care: Observation period completed  Discharge: Condition: Good, Destination: Home . AVS Declined  Performed by:  Baldwin Darice Helling, RN

## 2024-06-09 ENCOUNTER — Ambulatory Visit (HOSPITAL_COMMUNITY)

## 2024-06-16 ENCOUNTER — Encounter (HOSPITAL_COMMUNITY): Payer: Self-pay

## 2024-06-16 ENCOUNTER — Ambulatory Visit (HOSPITAL_COMMUNITY)
Admission: RE | Admit: 2024-06-16 | Discharge: 2024-06-16 | Disposition: A | Source: Ambulatory Visit | Attending: Internal Medicine | Admitting: Internal Medicine

## 2024-06-16 DIAGNOSIS — Z1231 Encounter for screening mammogram for malignant neoplasm of breast: Secondary | ICD-10-CM

## 2024-06-17 ENCOUNTER — Other Ambulatory Visit: Payer: Self-pay | Admitting: Allergy & Immunology

## 2024-06-28 ENCOUNTER — Ambulatory Visit

## 2024-06-30 ENCOUNTER — Ambulatory Visit: Admitting: Internal Medicine

## 2024-07-01 ENCOUNTER — Ambulatory Visit

## 2024-07-12 ENCOUNTER — Ambulatory Visit: Admitting: Family Medicine
